# Patient Record
Sex: Male | Born: 1945 | Race: White | Hispanic: No | Marital: Married | Smoking: Former smoker
Health system: Southern US, Community
[De-identification: ages and names within clinical notes are randomized; demographics above are authoritative.]

## PROBLEM LIST (undated history)

## (undated) DIAGNOSIS — K219 Gastro-esophageal reflux disease without esophagitis: Secondary | ICD-10-CM

## (undated) DIAGNOSIS — K5790 Diverticulosis of intestine, part unspecified, without perforation or abscess without bleeding: Secondary | ICD-10-CM

## (undated) DIAGNOSIS — R0600 Dyspnea, unspecified: Secondary | ICD-10-CM

## (undated) DIAGNOSIS — I209 Angina pectoris, unspecified: Secondary | ICD-10-CM

## (undated) DIAGNOSIS — I493 Ventricular premature depolarization: Secondary | ICD-10-CM

## (undated) DIAGNOSIS — E785 Hyperlipidemia, unspecified: Secondary | ICD-10-CM

## (undated) DIAGNOSIS — J189 Pneumonia, unspecified organism: Secondary | ICD-10-CM

## (undated) DIAGNOSIS — B029 Zoster without complications: Secondary | ICD-10-CM

## (undated) DIAGNOSIS — Z87442 Personal history of urinary calculi: Secondary | ICD-10-CM

## (undated) DIAGNOSIS — T7840XA Allergy, unspecified, initial encounter: Secondary | ICD-10-CM

## (undated) DIAGNOSIS — J90 Pleural effusion, not elsewhere classified: Secondary | ICD-10-CM

## (undated) DIAGNOSIS — N200 Calculus of kidney: Secondary | ICD-10-CM

## (undated) DIAGNOSIS — R55 Syncope and collapse: Secondary | ICD-10-CM

## (undated) DIAGNOSIS — I2510 Atherosclerotic heart disease of native coronary artery without angina pectoris: Secondary | ICD-10-CM

## (undated) DIAGNOSIS — M1990 Unspecified osteoarthritis, unspecified site: Secondary | ICD-10-CM

## (undated) DIAGNOSIS — I10 Essential (primary) hypertension: Secondary | ICD-10-CM

## (undated) DIAGNOSIS — N400 Enlarged prostate without lower urinary tract symptoms: Secondary | ICD-10-CM

## (undated) HISTORY — DX: Ventricular premature depolarization: I49.3

## (undated) HISTORY — PX: EYE SURGERY: SHX253

## (undated) HISTORY — DX: Essential (primary) hypertension: I10

## (undated) HISTORY — DX: Hyperlipidemia, unspecified: E78.5

## (undated) HISTORY — DX: Benign prostatic hyperplasia without lower urinary tract symptoms: N40.0

## (undated) HISTORY — DX: Pleural effusion, not elsewhere classified: J90

## (undated) HISTORY — DX: Diverticulosis of intestine, part unspecified, without perforation or abscess without bleeding: K57.90

## (undated) HISTORY — PX: APPENDECTOMY: SHX54

## (undated) HISTORY — DX: Calculus of kidney: N20.0

## (undated) HISTORY — DX: Syncope and collapse: R55

## (undated) HISTORY — DX: Gastro-esophageal reflux disease without esophagitis: K21.9

## (undated) HISTORY — DX: Allergy, unspecified, initial encounter: T78.40XA

---

## 1999-08-27 ENCOUNTER — Encounter: Admission: RE | Admit: 1999-08-27 | Discharge: 1999-08-27 | Payer: Self-pay | Admitting: Internal Medicine

## 2003-01-09 ENCOUNTER — Encounter: Admission: RE | Admit: 2003-01-09 | Discharge: 2003-01-09 | Payer: Self-pay | Admitting: Otolaryngology

## 2003-01-09 ENCOUNTER — Encounter: Payer: Self-pay | Admitting: Otolaryngology

## 2004-09-02 ENCOUNTER — Ambulatory Visit: Payer: Self-pay | Admitting: Internal Medicine

## 2004-12-01 ENCOUNTER — Ambulatory Visit: Payer: Self-pay | Admitting: Internal Medicine

## 2005-01-12 ENCOUNTER — Ambulatory Visit: Payer: Self-pay | Admitting: Internal Medicine

## 2005-02-09 ENCOUNTER — Encounter: Admission: RE | Admit: 2005-02-09 | Discharge: 2005-02-09 | Payer: Self-pay | Admitting: Otolaryngology

## 2005-04-21 ENCOUNTER — Ambulatory Visit: Payer: Self-pay | Admitting: Internal Medicine

## 2005-08-20 ENCOUNTER — Ambulatory Visit: Payer: Self-pay | Admitting: Internal Medicine

## 2005-09-30 ENCOUNTER — Ambulatory Visit: Payer: Self-pay | Admitting: Internal Medicine

## 2005-09-30 ENCOUNTER — Inpatient Hospital Stay (HOSPITAL_COMMUNITY): Admission: EM | Admit: 2005-09-30 | Discharge: 2005-10-01 | Payer: Self-pay | Admitting: Emergency Medicine

## 2005-10-13 ENCOUNTER — Ambulatory Visit: Payer: Self-pay | Admitting: Internal Medicine

## 2005-11-18 ENCOUNTER — Ambulatory Visit: Payer: Self-pay | Admitting: Internal Medicine

## 2005-11-20 ENCOUNTER — Ambulatory Visit: Payer: Self-pay | Admitting: Internal Medicine

## 2006-04-30 ENCOUNTER — Ambulatory Visit: Payer: Self-pay | Admitting: Internal Medicine

## 2006-06-11 ENCOUNTER — Ambulatory Visit: Payer: Self-pay | Admitting: Pulmonary Disease

## 2006-09-02 ENCOUNTER — Ambulatory Visit: Payer: Self-pay | Admitting: Internal Medicine

## 2006-09-02 LAB — CONVERTED CEMR LAB
ALT: 30 units/L (ref 0–40)
Albumin: 3.9 g/dL (ref 3.5–5.2)
Alkaline Phosphatase: 87 units/L (ref 39–117)
Basophils Absolute: 0 10*3/uL (ref 0.0–0.1)
Bilirubin Urine: NEGATIVE
Bilirubin, Direct: 0.2 mg/dL (ref 0.0–0.3)
CO2: 31 meq/L (ref 19–32)
Calcium: 9.2 mg/dL (ref 8.4–10.5)
Cholesterol: 180 mg/dL (ref 0–200)
GFR calc non Af Amer: 80 mL/min
Hemoglobin, Urine: NEGATIVE
Hemoglobin: 16.4 g/dL (ref 13.0–17.0)
Leukocytes, UA: NEGATIVE
MCHC: 33.8 g/dL (ref 30.0–36.0)
Monocytes Absolute: 0.7 10*3/uL (ref 0.2–0.7)
Neutro Abs: 3.4 10*3/uL (ref 1.4–7.7)
Nitrite: NEGATIVE
PSA: 3.5 ng/mL (ref 0.10–4.00)
Total CHOL/HDL Ratio: 3.6
Total Protein: 6.8 g/dL (ref 6.0–8.3)
Triglycerides: 162 mg/dL — ABNORMAL HIGH (ref 0–149)
Urine Glucose: NEGATIVE mg/dL
WBC: 7.3 10*3/uL (ref 4.5–10.5)
pH: 6 (ref 5.0–8.0)

## 2006-10-20 ENCOUNTER — Ambulatory Visit: Payer: Self-pay | Admitting: Internal Medicine

## 2006-11-10 ENCOUNTER — Ambulatory Visit: Payer: Self-pay | Admitting: Internal Medicine

## 2006-12-01 ENCOUNTER — Ambulatory Visit: Payer: Self-pay | Admitting: Internal Medicine

## 2006-12-01 LAB — CONVERTED CEMR LAB
Testosterone: 401.34 ng/dL (ref 350.00–890)
Vit D, 1,25-Dihydroxy: 34 (ref 20–57)

## 2007-01-17 ENCOUNTER — Ambulatory Visit: Payer: Self-pay | Admitting: Internal Medicine

## 2007-05-02 ENCOUNTER — Ambulatory Visit: Payer: Self-pay | Admitting: Internal Medicine

## 2007-07-08 ENCOUNTER — Encounter: Payer: Self-pay | Admitting: Internal Medicine

## 2007-07-08 DIAGNOSIS — J309 Allergic rhinitis, unspecified: Secondary | ICD-10-CM | POA: Insufficient documentation

## 2007-07-08 DIAGNOSIS — N200 Calculus of kidney: Secondary | ICD-10-CM | POA: Insufficient documentation

## 2007-07-08 DIAGNOSIS — K5730 Diverticulosis of large intestine without perforation or abscess without bleeding: Secondary | ICD-10-CM

## 2007-07-08 DIAGNOSIS — D126 Benign neoplasm of colon, unspecified: Secondary | ICD-10-CM

## 2007-07-08 DIAGNOSIS — Z886 Allergy status to analgesic agent status: Secondary | ICD-10-CM

## 2007-07-08 DIAGNOSIS — E785 Hyperlipidemia, unspecified: Secondary | ICD-10-CM | POA: Insufficient documentation

## 2007-07-08 DIAGNOSIS — J45909 Unspecified asthma, uncomplicated: Secondary | ICD-10-CM | POA: Insufficient documentation

## 2007-07-08 DIAGNOSIS — Z87898 Personal history of other specified conditions: Secondary | ICD-10-CM

## 2007-07-08 DIAGNOSIS — N400 Enlarged prostate without lower urinary tract symptoms: Secondary | ICD-10-CM | POA: Insufficient documentation

## 2007-07-08 DIAGNOSIS — J339 Nasal polyp, unspecified: Secondary | ICD-10-CM

## 2007-10-10 ENCOUNTER — Ambulatory Visit: Payer: Self-pay | Admitting: Internal Medicine

## 2007-10-10 DIAGNOSIS — M949 Disorder of cartilage, unspecified: Secondary | ICD-10-CM

## 2007-10-10 DIAGNOSIS — M899 Disorder of bone, unspecified: Secondary | ICD-10-CM | POA: Insufficient documentation

## 2007-11-24 ENCOUNTER — Ambulatory Visit: Payer: Self-pay | Admitting: Internal Medicine

## 2007-11-24 DIAGNOSIS — IMO0002 Reserved for concepts with insufficient information to code with codable children: Secondary | ICD-10-CM

## 2007-12-01 ENCOUNTER — Ambulatory Visit: Payer: Self-pay | Admitting: Internal Medicine

## 2007-12-01 DIAGNOSIS — M7020 Olecranon bursitis, unspecified elbow: Secondary | ICD-10-CM

## 2007-12-05 ENCOUNTER — Encounter: Payer: Self-pay | Admitting: Internal Medicine

## 2007-12-12 ENCOUNTER — Encounter: Payer: Self-pay | Admitting: Internal Medicine

## 2008-01-24 ENCOUNTER — Encounter: Payer: Self-pay | Admitting: Internal Medicine

## 2008-02-09 ENCOUNTER — Encounter: Payer: Self-pay | Admitting: Internal Medicine

## 2008-03-15 ENCOUNTER — Encounter: Payer: Self-pay | Admitting: Internal Medicine

## 2008-05-10 ENCOUNTER — Emergency Department (HOSPITAL_COMMUNITY): Admission: EM | Admit: 2008-05-10 | Discharge: 2008-05-10 | Payer: Self-pay | Admitting: Emergency Medicine

## 2008-05-10 ENCOUNTER — Telehealth (INDEPENDENT_AMBULATORY_CARE_PROVIDER_SITE_OTHER): Payer: Self-pay | Admitting: *Deleted

## 2008-05-17 ENCOUNTER — Ambulatory Visit: Payer: Self-pay | Admitting: Internal Medicine

## 2008-05-22 ENCOUNTER — Encounter: Payer: Self-pay | Admitting: Internal Medicine

## 2008-06-26 ENCOUNTER — Telehealth (INDEPENDENT_AMBULATORY_CARE_PROVIDER_SITE_OTHER): Payer: Self-pay | Admitting: *Deleted

## 2008-06-27 ENCOUNTER — Ambulatory Visit: Payer: Self-pay | Admitting: Internal Medicine

## 2008-06-27 DIAGNOSIS — R55 Syncope and collapse: Secondary | ICD-10-CM | POA: Insufficient documentation

## 2008-06-27 DIAGNOSIS — R002 Palpitations: Secondary | ICD-10-CM

## 2008-07-03 ENCOUNTER — Ambulatory Visit: Payer: Self-pay

## 2008-07-04 ENCOUNTER — Ambulatory Visit: Payer: Self-pay

## 2008-07-04 ENCOUNTER — Ambulatory Visit: Payer: Self-pay | Admitting: Cardiology

## 2008-07-05 ENCOUNTER — Encounter: Payer: Self-pay | Admitting: Cardiology

## 2008-07-05 ENCOUNTER — Ambulatory Visit: Payer: Self-pay

## 2008-07-11 ENCOUNTER — Encounter: Payer: Self-pay | Admitting: Internal Medicine

## 2008-07-16 ENCOUNTER — Ambulatory Visit: Payer: Self-pay | Admitting: Cardiology

## 2008-10-15 ENCOUNTER — Ambulatory Visit: Payer: Self-pay | Admitting: Cardiology

## 2008-10-15 ENCOUNTER — Encounter: Payer: Self-pay | Admitting: Cardiology

## 2008-12-10 ENCOUNTER — Encounter: Payer: Self-pay | Admitting: Internal Medicine

## 2009-06-10 ENCOUNTER — Encounter: Payer: Self-pay | Admitting: Internal Medicine

## 2009-06-11 ENCOUNTER — Ambulatory Visit: Payer: Self-pay | Admitting: Internal Medicine

## 2009-06-12 LAB — CONVERTED CEMR LAB
AST: 22 units/L (ref 0–37)
Albumin: 4.1 g/dL (ref 3.5–5.2)
BUN: 16 mg/dL (ref 6–23)
Basophils Relative: 1 % (ref 0.0–3.0)
Calcium: 9.3 mg/dL (ref 8.4–10.5)
Chloride: 102 meq/L (ref 96–112)
Cholesterol: 201 mg/dL — ABNORMAL HIGH (ref 0–200)
Eosinophils Absolute: 0.5 10*3/uL (ref 0.0–0.7)
Glucose, Bld: 95 mg/dL (ref 70–99)
HCT: 46.2 % (ref 39.0–52.0)
MCHC: 33.7 g/dL (ref 30.0–36.0)
Monocytes Relative: 9 % (ref 3.0–12.0)
RBC: 5.3 M/uL (ref 4.22–5.81)
RDW: 13.3 % (ref 11.5–14.6)
Total Bilirubin: 1.1 mg/dL (ref 0.3–1.2)
Total CHOL/HDL Ratio: 4
Triglycerides: 110 mg/dL (ref 0.0–149.0)
VLDL: 22 mg/dL (ref 0.0–40.0)
WBC: 7 10*3/uL (ref 4.5–10.5)

## 2009-07-23 ENCOUNTER — Ambulatory Visit: Payer: Self-pay | Admitting: Internal Medicine

## 2009-07-31 ENCOUNTER — Encounter: Payer: Self-pay | Admitting: Internal Medicine

## 2010-01-23 ENCOUNTER — Ambulatory Visit: Payer: Self-pay | Admitting: Internal Medicine

## 2010-01-23 LAB — CONVERTED CEMR LAB
ALT: 22 units/L (ref 0–53)
Albumin: 4 g/dL (ref 3.5–5.2)
BUN: 21 mg/dL (ref 6–23)
Basophils Absolute: 0.1 10*3/uL (ref 0.0–0.1)
CO2: 31 meq/L (ref 19–32)
Calcium: 9 mg/dL (ref 8.4–10.5)
Cholesterol: 192 mg/dL (ref 0–200)
Creatinine, Ser: 1 mg/dL (ref 0.4–1.5)
Glucose, Bld: 85 mg/dL (ref 70–99)
HCT: 48 % (ref 39.0–52.0)
HDL: 42.6 mg/dL (ref >39.00)
Hemoglobin: 16.4 g/dL (ref 13.0–17.0)
Lymphocytes Relative: 30.3 % (ref 12.0–46.0)
Monocytes Relative: 7.6 % (ref 3.0–12.0)
Neutro Abs: 3.8 10*3/uL (ref 1.4–7.7)
Neutrophils Relative %: 50.4 % (ref 43.0–77.0)
Potassium: 4.5 meq/L (ref 3.5–5.1)
Sodium: 137 meq/L (ref 135–145)
Total Bilirubin: 0.7 mg/dL (ref 0.3–1.2)
WBC: 7.6 10*3/uL (ref 4.5–10.5)

## 2010-01-28 LAB — CONVERTED CEMR LAB: IgE (Immunoglobulin E), Serum: 18.3 intl units/mL (ref 0.0–180.0)

## 2010-06-03 ENCOUNTER — Encounter: Payer: Self-pay | Admitting: Internal Medicine

## 2010-07-24 ENCOUNTER — Telehealth (INDEPENDENT_AMBULATORY_CARE_PROVIDER_SITE_OTHER): Payer: Self-pay | Admitting: *Deleted

## 2010-07-25 ENCOUNTER — Ambulatory Visit
Admission: RE | Admit: 2010-07-25 | Discharge: 2010-07-25 | Payer: Self-pay | Source: Home / Self Care | Attending: Internal Medicine | Admitting: Internal Medicine

## 2010-07-30 DIAGNOSIS — B029 Zoster without complications: Secondary | ICD-10-CM | POA: Insufficient documentation

## 2010-08-08 ENCOUNTER — Ambulatory Visit
Admission: RE | Admit: 2010-08-08 | Discharge: 2010-08-08 | Payer: Self-pay | Source: Home / Self Care | Attending: Internal Medicine | Admitting: Internal Medicine

## 2010-08-24 LAB — CONVERTED CEMR LAB
AST: 25 units/L (ref 0–37)
Alkaline Phosphatase: 86 units/L (ref 39–117)
BUN: 18 mg/dL (ref 6–23)
Basophils Absolute: 0.1 10*3/uL (ref 0.0–0.1)
Basophils Relative: 0.9 % (ref 0.0–1.0)
Bilirubin, Direct: 0.2 mg/dL (ref 0.0–0.3)
Calcium: 9.2 mg/dL (ref 8.4–10.5)
Chloride: 103 meq/L (ref 96–112)
Cholesterol: 148 mg/dL (ref 0–200)
Creatinine, Ser: 1 mg/dL (ref 0.4–1.5)
Glucose, Bld: 96 mg/dL (ref 70–99)
HDL: 42.5 mg/dL (ref >39.0)
Hemoglobin: 15.1 g/dL (ref 13.0–17.0)
LDL Cholesterol: 92 mg/dL (ref 0–99)
Lymphocytes Relative: 21.7 % (ref 12.0–46.0)
Monocytes Relative: 7.9 % (ref 3.0–11.0)
RBC: 5.54 M/uL (ref 4.22–5.81)
RDW: 13.5 % (ref 11.5–14.6)
Sodium: 140 meq/L (ref 135–145)
TSH: 0.98 microintl units/mL (ref 0.35–5.50)

## 2010-08-28 NOTE — Progress Notes (Signed)
Summary: appointment  Phone Note Call from Patient Call back at Home Phone [phone]   Caller: Patient Call For: dr wert Summary of Call: Patient phoned he would like to be seen by Dr. Sherene Sires tomorrow. He has a rash on the back of his head and in his hair he has knots in his hair line and it seems to be spreading. Patient can be reached at [phone] his cell [phone] Initial call taken by: Vedia Coffer,  July 24, 2010 3:09 PM  Follow-up for Phone Call        Spoke with patient-states he is having sores in head; ? shingles. Appt has been made for 07-25-10 at 11am with TP.Reynaldo Minium CMA  July 24, 2010 4:18 PM

## 2010-08-28 NOTE — Letter (Signed)
Summary: [facility]   Imported By: Sherian Rein 06/12/2010 14:27:54  _____________________________________________________________________  External Attachment:    Type:   Image     Comment:   External Document

## 2010-08-28 NOTE — Consult Note (Signed)
Summary: [facility]  [facility]   Imported By: Lester Commerce 08/15/2009 11:02:51  _____________________________________________________________________  External Attachment:    Type:   Image     Comment:   External Document

## 2010-08-28 NOTE — Assessment & Plan Note (Signed)
Summary: Primary svc/ ext ov multiple issues    Primary Provider/Referring Provider:  Sherene Sires  CC:  6 month followup.  Pt fasting.  Pt c/o nasal congestion and states "always have had this problem"- he is able to blow nose sometimes and mucus is yellow.  No other complaints today.[name] Kitchen  History of Present Illness: 65 year old male with history  of Triad Asthma and hyperlipidemia   January 23, 2010 6 month followup.  Pt fasting.  Pt c/o nasal congestion and states "always have had this problem"- he is able to blow nose sometimes and mucus is yellow.  No other complaints today. due f/u with ent.  no cp/tia.  Pt denies any significant sore throat, dysphagia, itching, sneezing, fever, chills, sweats, unintended wt loss, pleuritic or exertional cp, hempoptysis, change in activity tolerance  orthopnea pnd or leg swelling.  Pt also denies any obvious fluctuation in symptoms with weather or environmental change or other alleviating or aggravating factors.    Pt denies any increase in rescue therapy over baseline, denies waking up needing it or having early am exacerbations of coughing/wheezing/ or dyspnea   Current Medications (verified): 1)  Singulair 10 Mg  Tabs (Montelukast Sodium) .... Once Daily 2)  Simvastatin 80 Mg Tabs (Simvastatin) .[name] Kitchen.. 1 At Bedtime 3)  Symbicort 160-4.5 Mcg/act  Aero (Budesonide-Formoterol Fumarate) .... Two Puffs Every 12 Hours 4)  Centrum   Tabs (Multiple Vitamins-Minerals) .... Once Daily 5)  Omeprazole 20 Mg  Cpdr (Omeprazole) .... Once Daily 6)  Calcium 500 500 Mg  Tabs (Calcium Carbonate) .... Once Daily 7)  Lopressor 50 Mg Tabs (Metoprolol Tartrate) .... One Half Twice Daily 8)  Lorazepam 0.5 Mg  Tabs (Lorazepam) .... One A Day As Needed 9)  Albuterol 90 Mcg/act  Aers (Albuterol) .... 2 Puffs Every 4 Hrs As Needed 10)  Afrin Nasal Spray 0.05 %  Soln (Oxymetazoline Hcl) .... As Needed 11)  Tylenol Extra Strength 500 Mg  Tabs (Acetaminophen) .... As Needed 12)  Zovirax 5 %  Oint (Acyclovir) .... Apply Four Times A Day 13)  Chlor-Trimeton 4 Mg Tabs (Chlorpheniramine Maleate) .... As Directed On Bottle As Needed  Allergies (verified): 1)  ! Asa 2)  Augmentin 3)  Amoxicillin  Past History:  Past Medical History: PVCs.  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) ALLERGIC RHINITIS, CHRONIC (ICD-477.9)........................[name]      - steroid dep until mid oct 2009      - Allergy profile sent January 23, 2010 >> IgE 18.3  BENIGN PROSTATIC HYPERTROPHY, HX OF (ICD-V13.8) DIVERTICULOSIS, MILD (ICD-562.10)................................[name] KitchenLina Sar     - flex sig 08/12/00     - letter sent 06/05/05, reminded May 17, 2008  NEPHROLITHIASIS (ICD-592.0) HYPERLIPIDEMIA (ICD-272.4) target < 130 male, pos fm hx, h/l smoking HEALTH MAINTENANCE.......................................................[name] KitchenWert   - CPX  July 23, 2009    - Pneumovax 5/06  and @ age 50 June 11, 2009    - Td 1/07     Vital Signs:  Patient profile:   65 year old male Weight:      189 pounds O2 Sat:      96 % on Room air Temp:     97.7 degrees F oral Pulse rate:   71 / minute BP sitting:   128 / 78  (left  arm)  Vitals Entered By: Vernie Murders (January 23, 2010 9:30 AM)  O2 Flow:  Room air  Physical Exam  Additional Exam:  wt  198 May 17, 2008 > 198 June 27, 2008 >   197 July 23, 2009 > 189 January 23, 2010  Ambulatory healthy appearing wm  in no acute distress but striking  nasal tone to voice  HEENT: nl dentition, turbinates, and orophanx. Nl external ear canals without cough reflex Neck without JVD/Nodes/TM Lungs clear to A and P bilaterally without cough on insp or exp maneuvers RRR no s3 or murmur or increase in P2 Abd soft and benign with nl excursion in the supine position. No bruits or  organomegaly Ext warm without calf tenderness, cyanosis clubbing or edema Skin warm and dry without lesions    Sodium                    137 mEq/L                   135-145   Potassium                 4.5 mEq/L                   3.5-5.1   Chloride                  100 mEq/L                   96-112   Carbon Dioxide            31 mEq/L                    19-32   Glucose                   85 mg/dL                    08-65   BUN                       21 mg/dL                    7-84   Creatinine                1.0 mg/dL                   6.9-6.2   Calcium                   9.0 mg/dL                   9.5-28.4   GFR                       82.19 mL/min                >60  Tests: (2) Hepatic/Liver Function Panel (HEPATIC)   Total Bilirubin           0.7 mg/dL                   1.3-2.4   Direct Bilirubin          0.1 mg/dL                   4.0-1.0   Alkaline Phosphatase      73 U/L  39-117   AST                       22 U/L                      0-37   ALT                       22 U/L                      0-53   Total Protein             6.9 g/dL                    0.9-8.1   Albumin                   4.0 g/dL                    1.9-1.4  Tests: (3) Lipid Panel (LIPID)   Cholesterol               192 mg/dL                   7-829     ATP III Classification            Desirable:  < 200 mg/dL                    Borderline High:  200 - 239 mg/dL               High:  > = 240 mg/dL   Triglycerides             106.0 mg/dL                 5.6-213.0     Normal:  <150 mg/dL     Borderline High:  865 - 199 mg/dL   HDL                       78.46 mg/dL                 >96.29   VLDL Cholesterol          21.2 mg/dL                  5.2-84.1   LDL Cholesterol      [H]  324 mg/dL                   4-01  CHO/HDL Ratio:  CHD Risk                             5                    Men          Women     1/2 Average Risk     3.4          3.3     Average Risk          5.0          4.4      2X Average Risk          9.6          7.1  3X Average Risk          15.0          11.0                           Tests: (4) CBC Platelet w/Diff (CBCD)   White Cell Count          7.6 K/uL                    4.5-10.5   Red Cell Count            5.64 Mil/uL                 4.22-5.81   Hemoglobin                16.4 g/dL                   04.5-40.9   Hematocrit                48.0 %                      39.0-52.0   MCV                       85.0 fl                     78.0-100.0   MCHC                      34.2 g/dL                   81.1-91.4   RDW                       14.2 %                      11.5-14.6   Platelet Count            236.0 K/uL                  150.0-400.0   Neutrophil %              50.4 %                      43.0-77.0   Lymphocyte %              30.3 %                      12.0-46.0   Monocyte %                7.6 %                       3.0-12.0   Eosinophils%         [H]  11.0 %                      0.0-5.0   Basophils %               0.7 %                       0.0-3.0   Neutrophill Absolute      3.8  K/uL                    1.4-7.7   Lymphocyte Absolute       2.3 K/uL                    0.7-4.0   Monocyte Absolute         0.6 K/uL                    0.1-1.0  Eosinophils, Absolute                        [H]  0.8 K/uL                    0.0-0.7   Basophils Absolute        0.1 K/uL                    0.0-0.1   Impression & Recommendations:  Problem # 1:  HYPERLIPIDEMIA (ICD-272.4)  target < 130 male, pos fm hx, h/l smoking  His updated medication list for this problem includes:    Simvastatin 80 Mg Tabs (Simvastatin) .[name] Kitchen... 1 at bedtime    Labs Reviewed: SGOT: 22 (06/11/2009)   SGPT: 20 (06/11/2009)   HDL:45.00 (06/11/2009), 42.5 (10/10/2007)  LDL:92 (10/10/2007), 98 (09/02/2006)  >  128 January 23, 2010  so at goal  Chol:201 (06/11/2009), 148 (10/10/2007)  Trig:110.0 (06/11/2009), 68 (10/10/2007)  Problem # 2:  ALLERGIC RHINITIS, CHRONIC (ICD-477.9)  His  updated medication list for this problem includes:    Afrin Nasal Spray 0.05 % Soln (Oxymetazoline hcl) .[name] Kitchen... As needed    Chlor-trimeton 4 Mg Tabs (Chlorpheniramine maleate) .[name] Kitchen... As directed on bottle as needed    May need additional nasal surgery as he has given up on medical rx, defer to ent  Problem # 3:  ASTHMA (ICD-493.90) All goals of asthma met including optimal function and elimination of symptoms with minimum need for rescue therapy. Contingencies discussed today including the rule of two's.   Each maintenance medication was reviewed in detail including most importantly the difference between maintenance prns and under what circumstances the prns are to be used.  In addition, these two groups (for which the patient should keep up with refills) were distinguished from a third group :  meds that are used only short term with the intent to complete a course of therapy and then not refill them.  The med list was then fully reconciled and reorganized to reflect this important distinction.   Other Orders: T-Allergy Profile [facility] [place], [place], [place], [place], [place] [phone]) TLB-BMP (Basic Metabolic Panel-BMET) (80048-METABOL) TLB-Hepatic/Liver Function Pnl (80076-HEPATIC) TLB-Lipid Panel (80061-LIPID) TLB-CBC Platelet - w/Differential (85025-CBCD) Est. Patient Level IV (56213)  Patient Instructions: 1)  Return to office in 3 months, sooner if needed 2)  NEED TO  call for  COLONOSCOPYand urology f/u

## 2010-08-28 NOTE — Assessment & Plan Note (Signed)
Summary: 2 WEEK ROV//SH   Primary Provider/Referring Provider:  Sherene Sires  CC:  Pt here for follow up on Shingles. States rash is gone and does c/o tingling sensation when touched.  History of Present Illness: 65 year old male with history  of Triad Asthma and hyperlipidemia   January 23, 2010 6 month followup.  Pt fasting.  Pt c/o nasal congestion and states "always have had this problem"- he is able to blow nose sometimes and mucus is yellow.  No other complaints today. due f/u with ent.  no cp/tia.    07/25/10- Presents for an acute office visit. Complains of  red, itchy, painful bumps on the top of his head- onset was 2-3 days ago. Noticed a tingling burning sensation along left side of scalp line. >>Dx w/ herpes zoster-rx Valtrex  August 12, 2010 --Presents for follow up of shingles. He is feeling better , rash is gone, does c/o tingling sensation when touched but is much better. Finished Valtrex. Denies chest pain, dyspnea, orthopnea, hemoptysis, fever, n/v/d, edema, headache.   Preventive Screening-Counseling & Management  Alcohol-Tobacco     Smoking Status: quit     Year Quit: 1971     Pack years: 61yrs, 2ppd  Medications Prior to Update: 1)  Singulair 10 Mg  Tabs (Montelukast Sodium) .... Once Daily 2)  Simvastatin 80 Mg Tabs (Simvastatin) .[name] Kitchen.. 1 At Bedtime 3)  Symbicort 160-4.5 Mcg/act  Aero (Budesonide-Formoterol Fumarate) .... Two Puffs Every 12 Hours 4)  Centrum   Tabs (Multiple Vitamins-Minerals) .... Once Daily 5)  Omeprazole 20 Mg  Cpdr (Omeprazole) .... Once Daily 6)  Calcium 500 500 Mg  Tabs (Calcium Carbonate) .... Once Daily 7)  Lopressor 50 Mg Tabs (Metoprolol Tartrate) .... One Half Twice Daily 8)  Lorazepam 0.5 Mg  Tabs (Lorazepam) .... One A Day As Needed 9)  Albuterol 90 Mcg/act  Aers (Albuterol) .... 2 Puffs Every 4 Hrs As Needed 10)  Afrin Nasal Spray 0.05 %  Soln (Oxymetazoline Hcl) .... As Needed 11)  Tylenol Extra Strength 500 Mg  Tabs (Acetaminophen) .... As  Needed 12)  Zovirax 5 % Oint (Acyclovir) .... Apply Four Times A Day 13)  Chlor-Trimeton 4 Mg Tabs (Chlorpheniramine Maleate) .... As Directed On Bottle As Needed 14)  Valtrex 1 Gm Tabs (Valacyclovir Hcl) .[name] Kitchen.. 1 By Mouth Three Times A Day 15)  Prednisone 10 Mg Tabs (Prednisone) .... 4 Tabs For 2 Days, Then 3 Tabs For 2 Days, 2 Tabs For 2 Days, Then 1 Tab For 2 Days, Then Stop 16)  Vicodin 5-500 Mg Tabs (Hydrocodone-Acetaminophen) .[name] Kitchen.. 1 Every 4 Hr As Needed Severe Pain  Current Medications (verified): 1)  Singulair 10 Mg  Tabs (Montelukast Sodium) .... Once Daily 2)  Simvastatin 80 Mg Tabs (Simvastatin) .[name] Kitchen.. 1 At Bedtime 3)  Symbicort 160-4.5 Mcg/act  Aero (Budesonide-Formoterol Fumarate) .... Two Puffs Every 12 Hours 4)  Centrum   Tabs (Multiple Vitamins-Minerals) .... Once Daily 5)  Omeprazole 20 Mg  Cpdr (Omeprazole) .... Once Daily 6)  Calcium 500 500 Mg  Tabs (Calcium Carbonate) .... Once Daily 7)  Lopressor 50 Mg Tabs (Metoprolol Tartrate) .... One Half Twice Daily 8)  Lorazepam 0.5 Mg  Tabs (Lorazepam) .... One A Day As Needed 9)  Albuterol 90 Mcg/act  Aers (Albuterol) .... 2 Puffs Every 4 Hrs As Needed 10)  Afrin Nasal Spray 0.05 %  Soln (Oxymetazoline Hcl) .... As Needed 11)  Tylenol Extra Strength 500 Mg  Tabs (Acetaminophen) .... As Needed 12)  Zovirax  5 % Oint (Acyclovir) .... Apply Four Times A Day As Needed 13)  Chlor-Trimeton 4 Mg Tabs (Chlorpheniramine Maleate) .... As Directed On Bottle As Needed 14)  Vicodin 5-500 Mg Tabs (Hydrocodone-Acetaminophen) .[name] Kitchen.. 1 Every 4 Hr As Needed Severe Pain  Allergies (verified): 1)  ! Asa 2)  ! Sulfa 3)  Augmentin 4)  Amoxicillin  Past History:  Past Medical History: Last updated: 01/23/2010 PVCs.  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) ALLERGIC RHINITIS,  CHRONIC (ICD-477.9)........................[name]      - steroid dep until mid oct 2009      - Allergy profile sent January 23, 2010 >> IgE 18.3  BENIGN PROSTATIC HYPERTROPHY, HX OF (ICD-V13.8) DIVERTICULOSIS, MILD (ICD-562.10)................................[name] KitchenLina Sar     - flex sig 08/12/00     - letter sent 06/05/05, reminded May 17, 2008  NEPHROLITHIASIS (ICD-592.0) HYPERLIPIDEMIA (ICD-272.4) target < 130 male, pos fm hx, h/l smoking HEALTH MAINTENANCE.......................................................[name] KitchenWert   - CPX  July 23, 2009    - Pneumovax 5/06  and @ age 50 June 11, 2009    - Td 1/07     Past Surgical History: Last updated: 07/08/2007 Appendectomy Colonoscopy  Family History: Last updated: 10/12/2008  heart disease in his father at age 63 he was a smoker positive for coronary artery disease in his   father.  siblings healthy mother still living without ascvd  Social History: Last updated: 07/23/2009 quit smoking 1971 rarely drink alcohol Retired  Risk Factors: Smoking Status: quit (08/08/2010)  Review of Systems      See HPI  Vital Signs:  Patient profile:   65 year old male Height:      70 inches Weight:      195.2 pounds BMI:     28.11 O2 Sat:      97 % on Room air Temp:     96.9 degrees F oral Pulse rate:   68 / minute BP sitting:   126 / 76  (left arm) Cuff size:   regular  Vitals Entered By: Zackery Barefoot CMA (August 08, 2010 11:32 AM)  O2 Flow:  Room air CC: Pt here for follow up on Shingles. States rash is gone, does c/o tingling sensation when touched Is Patient Diabetic? No Comments Medications reviewed with patient Verified contact number and pharmacy with patient Zackery Barefoot [facility]  August 08, 2010 11:33 AM    Physical Exam  Additional Exam:  wt  198 May 17, 2008 > 198 June 27, 2008 >   197 July 23, 2009 > 189 January 23, 2010 >>195 12/30 >>195 1/13 Ambulatory healthy appearing wm  in no acute distress  but striking  nasal tone to voice  HEENT: nl dentition, turbinates, and orophanx. Nl external ear canals without cough reflex Neck without JVD/Nodes/TM Lungs clear to A and P bilaterally without cough on insp or exp maneuvers RRR no s3 or murmur or increase in P2 Abd soft and benign with nl excursion in the supine position. No bruits or organomegaly Ext warm without calf tenderness, cyanosis clubbing or edema Skin: rash resolved w/ residual hyperpigmentation  along scalp/hairline    Impression & Recommendations:  Problem # 1:  HERPES ZOSTER (ICD-053.9)  Improved with Valtrex. Does not appear to have any significant pain  Plan: follow up Dr. Sherene Sires 1  months  and as needed  Tylenol as needed     Orders: Est. Patient Level II (98119)  Medications Added to Medication List This Visit: 1)  Zovirax 5 % Oint (Acyclovir) .... Apply four times a day as needed  Patient Instructions: 1)  follow up Dr. Sherene Sires 1 months  and as needed  2)  Tylenol as needed  3)

## 2010-08-28 NOTE — Assessment & Plan Note (Signed)
Summary: Acute NP office visit - ? shingles   Primary Provider/Referring Provider:  Sherene Sires  CC:  Acute visit.  Pt c/o red, itchy, and painful bumps on the top of his head- onset was 2-3 days ago.Antonio Carlson  History of Present Illness: 65 year old male with history  of [facility] Asthma and hyperlipidemia   January 23, 2010 6 month followup.  Pt fasting.  Pt c/o nasal congestion and states "always have had this problem"- he is able to blow nose sometimes and mucus is yellow.  No other complaints today. due f/u with ent.  no cp/tia.    07/25/10- Presents for an acute office visit. Complains of  red, itchy, painful bumps on the top of his head- onset was 2-3 days ago. Noticed a tingling burning sensation along left side of scalp line. No new meds. no drainage , fever. NO visual /speech changes. no recent travel.Denies chest pain, dyspnea, orthopnea, hemoptysis, fever, n/v/d, edema, headache,recent travel or antibiotics.    Preventive Screening-Counseling & Management  Alcohol-Tobacco     Smoking Status: quit  Medications Prior to Update: 1)  Singulair 10 Mg  Tabs (Montelukast Sodium) .... Once Daily 2)  Simvastatin 80 Mg Tabs (Simvastatin) .Antonio Carlson.. 1 At Bedtime 3)  Symbicort 160-4.5 Mcg/act  Aero (Budesonide-Formoterol Fumarate) .... Two Puffs Every 12 Hours 4)  Centrum   Tabs (Multiple Vitamins-Minerals) .... Once Daily 5)  Omeprazole 20 Mg  Cpdr (Omeprazole) .... Once Daily 6)  Calcium 500 500 Mg  Tabs (Calcium Carbonate) .... Once Daily 7)  Lopressor 50 Mg Tabs (Metoprolol Tartrate) .... One Half Twice Daily 8)  Lorazepam 0.5 Mg  Tabs (Lorazepam) .... One A Day As Needed 9)  Albuterol 90 Mcg/act  Aers (Albuterol) .... 2 Puffs Every 4 Hrs As Needed 10)  Afrin Nasal Spray 0.05 %  Soln (Oxymetazoline Hcl) .... As Needed 11)  Tylenol Extra Strength 500 Mg  Tabs (Acetaminophen) .... As Needed 12)  Zovirax 5 % Oint (Acyclovir) .... Apply Four Times A Day 13)  Chlor-Trimeton 4 Mg Tabs (Chlorpheniramine Maleate)  .... As Directed On Bottle As Needed  Current Medications (verified): 1)  Singulair 10 Mg  Tabs (Montelukast Sodium) .... Once Daily 2)  Simvastatin 80 Mg Tabs (Simvastatin) .Antonio Carlson.. 1 At Bedtime 3)  Symbicort 160-4.5 Mcg/act  Aero (Budesonide-Formoterol Fumarate) .... Two Puffs Every 12 Hours 4)  Centrum   Tabs (Multiple Vitamins-Minerals) .... Once Daily 5)  Omeprazole 20 Mg  Cpdr (Omeprazole) .... Once Daily 6)  Calcium 500 500 Mg  Tabs (Calcium Carbonate) .... Once Daily 7)  Lopressor 50 Mg Tabs (Metoprolol Tartrate) .... One Half Twice Daily 8)  Lorazepam 0.5 Mg  Tabs (Lorazepam) .... One A Day As Needed 9)  Albuterol 90 Mcg/act  Aers (Albuterol) .... 2 Puffs Every 4 Hrs As Needed 10)  Afrin Nasal Spray 0.05 %  Soln (Oxymetazoline Hcl) .... As Needed 11)  Tylenol Extra Strength 500 Mg  Tabs (Acetaminophen) .... As Needed 12)  Zovirax 5 % Oint (Acyclovir) .... Apply Four Times A Day 13)  Chlor-Trimeton 4 Mg Tabs (Chlorpheniramine Maleate) .... As Directed On Bottle As Needed  Allergies (verified): 1)  ! Asa 2)  Augmentin 3)  Amoxicillin  Past History:  Past Medical History: Last updated: 01/23/2010 PVCs.  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) ALLERGIC RHINITIS, CHRONIC (ICD-477.9)........................[name]      - steroid dep until mid oct 2009      - Allergy profile sent January 23, 2010 >> IgE 18.3  BENIGN PROSTATIC HYPERTROPHY, HX OF (ICD-V13.8) DIVERTICULOSIS, MILD (ICD-562.10)................................Antonio KitchenLina Sar     - flex sig 08/12/00     - letter sent 06/05/05, reminded May 17, 2008  NEPHROLITHIASIS (ICD-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  siblings healthy mother still living without ascvd  Social History: Last updated: 07/23/2009 quit smoking 1971 rarely drink alcohol Retired  Risk Factors: Smoking Status: quit (07/25/2010)  Review of Systems      See HPI  Vital Signs:  Patient profile:   65 year old male Height:      70 inches Weight:      195 pounds BMI:     28.08 O2 Sat:      95 % on Room air Temp:     96.7 degrees F oral Pulse rate:   70 / minute BP sitting:   120 / 70  (left arm)  Vitals Entered By: Vernie Murders (July 25, 2010 10:53 AM)  O2 Flow:  Room air CC: Acute visit.  Pt c/o red, itchy, painful bumps on the top of his head- onset was 2-3 days ago. Is Patient Diabetic? No Comments Medications reviewed with patient Daytime contact number verified with patient.    Physical Exam  Additional Exam:  wt  198 May 17, 2008 > 198 June 27, 2008 >   197 July 23, 2009 > 189 January 23, 2010 >>195 12/30  Ambulatory healthy appearing wm  in no acute distress but striking  nasal tone to voice  HEENT: nl dentition, turbinates, and orophanx. Nl external ear canals without cough reflex Neck without JVD/Nodes/TM Lungs clear to A and P bilaterally without cough on insp or exp maneuvers RRR no s3 or murmur or increase in P2 Abd soft and benign with nl excursion in the supine position. No bruits or organomegaly Ext warm without calf tenderness, cyanosis clubbing or edema Skin: grouped vesicular/papular rash along frontal scalp line on left radiating along temple     Impression & Recommendations:  Problem # 1:  HERPES ZOSTER (ICD-053.9) Left scalp herpes zoster  Plan:  Valtrex 1gram three  times a day for 7days Prednisone taper over next week.  Vicodin 1 every 4-6 hr for severe pain, may make you sleepy.  Please contact office for sooner follow up if symptoms do not improve or worsen  follow up 2 weeks and as needed   Medications Added to Medication List This Visit: 1)  Valtrex 1 Gm Tabs (Valacyclovir hcl) .Antonio Carlson.. 1 by mouth three times a day 2)  Prednisone 10 Mg Tabs (Prednisone) .... 4 tabs for 2 days, then 3 tabs for 2 days, 2 tabs for 2 days, then 1 tab for 2 days, then stop 3)  Vicodin 5-500 Mg Tabs (Hydrocodone-acetaminophen) .Antonio Carlson.. 1 every 4 hr as needed severe pain  Other Orders: Est. Patient Level IV ([address]109)  Patient  Instructions: 1)  Valtrex 1gram three times a day for 7days 2)  Prednisone taper over next week.  3)  Vicodin 1 every 4-6 hr for severe pain, may make you sleepy.  4)  Please contact office for sooner follow up if symptoms do not improve or worsen  5)  follow up 2 weeks and as needed  Prescriptions: SYMBICORT 160-4.5 MCG/ACT  AERO (BUDESONIDE-FORMOTEROL FUMARATE) Two puffs every 12 hours  #1 x 3   Entered and Authorized by:   Rubye Oaks NP   Signed by:   Kayonna Lawniczak NP on 07/25/2010   Method used:   Electronically to        [id number] (retail)       [address]       [place], [place]  [address]       Ph: [phone]       Fax: [phone]   RxID:   [id number] SINGULAIR 10 MG  TABS (MONTELUKAST SODIUM) once daily  #30 x 3   Entered and Authorized by:   Rubye Oaks NP   Signed by:   Javoris Star NP on 07/25/2010   Method used:   Electronically to        [id number] (retail)       [address]       [place], [place]  [address]       Ph: [phone]       Fax: [phone]   RxID:   [id number] OMEPRAZOLE 20 MG  CPDR (OMEPRAZOLE) once daily  #90 x 0   Entered and Authorized by:   Rubye Oaks NP   Signed by:   Khori Rosevear NP on 07/25/2010   Method  used:   Electronically to        [id number] ([facility])       [address]       [place], [place]  [address]       Ph: [phone]       Fax: [phone]   RxID:   [id number] VICODIN 5-500 MG TABS (HYDROCODONE-ACETAMINOPHEN) 1 every 4 hr as needed severe pain  #20 x 0   Entered and Authorized by:   Rubye Oaks NP   Signed by:   Sierra Bissonette NP on 07/25/2010   Method used:   Print then Give to Patient   RxID:   [id number] PREDNISONE 10 MG TABS (PREDNISONE) 4 tabs for 2 days, then 3 tabs for 2 days, 2 tabs for 2 days, then 1 tab for 2 days, then stop  #20 x 0   Entered and Authorized by:   Rubye Oaks NP   Signed by:   Mae Cianci NP on 07/25/2010   Method used:   Electronically to        [id number] ([facility]       [address]       [place], [place]  [address]       Ph: [phone]       Fax: [phone]   RxID:   [id number] VALTREX 1 GM TABS (VALACYCLOVIR HCL) 1 by mouth three times a day  #21 x 0   Entered and Authorized by:   Rubye Oaks NP   Signed  by:   Rubye Oaks NP on 07/25/2010   Method used:   Electronically to        [id number] ([facility]       [address]       [place], [place]  [address]       Ph: [phone]       Fax: [phone]   RxID:   [id number]

## 2010-09-01 ENCOUNTER — Telehealth (INDEPENDENT_AMBULATORY_CARE_PROVIDER_SITE_OTHER): Payer: Self-pay | Admitting: *Deleted

## 2010-09-11 ENCOUNTER — Ambulatory Visit (INDEPENDENT_AMBULATORY_CARE_PROVIDER_SITE_OTHER): Payer: Medicare Other | Admitting: Adult Health

## 2010-09-11 ENCOUNTER — Encounter: Payer: Self-pay | Admitting: Adult Health

## 2010-09-11 ENCOUNTER — Telehealth (INDEPENDENT_AMBULATORY_CARE_PROVIDER_SITE_OTHER): Payer: Self-pay | Admitting: *Deleted

## 2010-09-11 DIAGNOSIS — R109 Unspecified abdominal pain: Secondary | ICD-10-CM

## 2010-09-11 DIAGNOSIS — K921 Melena: Secondary | ICD-10-CM

## 2010-09-11 NOTE — Progress Notes (Signed)
Summary: refills  Phone Note From Pharmacy   Caller: Antonio Carlson with [facility] Call For: Antonio Carlson  Summary of Call: Antonio Carlson with [facility] Mr. Antonio Carlson has transferred his prescriptions to them and he is out of refills on Omeprazole, and Simvastatin 80 mg, She needs new prescriptions for these called into [phone]  Initial call taken by: Vedia Coffer,  September 01, 2010 12:00 PM    Prescriptions: OMEPRAZOLE 20 MG  CPDR (OMEPRAZOLE) once daily  #30 x 5   Entered by:   Vernie Murders   Authorized by:   [name] MD   Signed by:   Vernie Murders on 09/01/2010   Method used:   Electronically to        [facility]* (retail)       [address]       [place], [place]  [address]       Ph: [phone]       Fax: [phone]   RxID:   [id number] SIMVASTATIN 80 MG TABS (SIMVASTATIN) 1 at bedtime  #30 x 5   Entered by:   Vernie Murders   Authorized by:   [name] MD   Signed by:   Vernie Murders on 09/01/2010   Method used:   Electronically to        [facility]* (retail)       [address]       [place], [place]  [address]       Ph: [phone]       Fax: [phone]   RxID:   [id number]

## 2010-09-12 ENCOUNTER — Telehealth: Payer: Self-pay | Admitting: Internal Medicine

## 2010-09-12 ENCOUNTER — Other Ambulatory Visit: Payer: Self-pay | Admitting: Internal Medicine

## 2010-09-12 ENCOUNTER — Encounter (INDEPENDENT_AMBULATORY_CARE_PROVIDER_SITE_OTHER): Payer: Self-pay | Admitting: *Deleted

## 2010-09-12 ENCOUNTER — Ambulatory Visit (INDEPENDENT_AMBULATORY_CARE_PROVIDER_SITE_OTHER)
Admission: RE | Admit: 2010-09-12 | Discharge: 2010-09-12 | Disposition: A | Discharge status: Home / Self Care | Payer: Medicare Other | Source: Ambulatory Visit | Attending: Internal Medicine | Admitting: Internal Medicine

## 2010-09-12 ENCOUNTER — Encounter: Payer: Self-pay | Admitting: Adult Health

## 2010-09-12 ENCOUNTER — Other Ambulatory Visit: Payer: Self-pay | Admitting: Adult Health

## 2010-09-12 ENCOUNTER — Other Ambulatory Visit: Payer: Medicare Other

## 2010-09-12 DIAGNOSIS — K921 Melena: Secondary | ICD-10-CM

## 2010-09-12 DIAGNOSIS — R109 Unspecified abdominal pain: Secondary | ICD-10-CM

## 2010-09-12 LAB — CBC WITH DIFFERENTIAL/PLATELET
Basophils Absolute: 0.1 10*3/uL (ref 0.0–0.1)
Lymphocytes Relative: 22.2 % (ref 12.0–46.0)
Lymphs Abs: 2.7 10*3/uL (ref 0.7–4.0)
MCHC: 33.4 g/dL (ref 30.0–36.0)
MCV: 87.1 fl (ref 78.0–100.0)
Monocytes Absolute: 0.9 10*3/uL (ref 0.1–1.0)
Monocytes Relative: 7.8 % (ref 3.0–12.0)
Neutro Abs: 7.4 10*3/uL (ref 1.4–7.7)
Platelets: 257 10*3/uL (ref 150.0–400.0)
RDW: 14.8 % — ABNORMAL HIGH (ref 11.5–14.6)
WBC: 12 10*3/uL — ABNORMAL HIGH (ref 4.5–10.5)

## 2010-09-12 LAB — URINALYSIS, ROUTINE W REFLEX MICROSCOPIC
Ketones, ur: NEGATIVE
Leukocytes, UA: NEGATIVE
Nitrite: NEGATIVE
Specific Gravity, Urine: <=1.005 (ref 1.000–1.030)
Total Protein, Urine: NEGATIVE
Urine Glucose: NEGATIVE
pH: 6 (ref 5.0–8.0)

## 2010-09-12 LAB — BASIC METABOLIC PANEL
Creatinine, Ser: 1 mg/dL (ref 0.4–1.5)
Potassium: 4.9 mEq/L (ref 3.5–5.1)

## 2010-09-12 LAB — HEPATIC FUNCTION PANEL
ALT: 19 U/L (ref 0–53)
Albumin: 3.9 g/dL (ref 3.5–5.2)
Alkaline Phosphatase: 81 U/L (ref 39–117)
Bilirubin, Direct: 0.2 mg/dL (ref 0.0–0.3)

## 2010-09-15 ENCOUNTER — Encounter: Payer: Self-pay | Admitting: Physician Assistant

## 2010-09-15 ENCOUNTER — Encounter: Payer: Self-pay | Admitting: Internal Medicine

## 2010-09-15 ENCOUNTER — Ambulatory Visit (INDEPENDENT_AMBULATORY_CARE_PROVIDER_SITE_OTHER): Payer: Medicare Other | Admitting: Physician Assistant

## 2010-09-15 DIAGNOSIS — I10 Essential (primary) hypertension: Secondary | ICD-10-CM | POA: Insufficient documentation

## 2010-09-15 DIAGNOSIS — R1032 Left lower quadrant pain: Secondary | ICD-10-CM | POA: Insufficient documentation

## 2010-09-15 DIAGNOSIS — K625 Hemorrhage of anus and rectum: Secondary | ICD-10-CM

## 2010-09-15 DIAGNOSIS — R1031 Right lower quadrant pain: Secondary | ICD-10-CM | POA: Insufficient documentation

## 2010-09-15 HISTORY — DX: Hemorrhage of anus and rectum: K62.5

## 2010-09-17 NOTE — Progress Notes (Signed)
Summary: blood in stool  Phone Note Call from Patient   Caller: Patient Call For: wert Summary of Call: Patient phoned stated that night before last he started having dirrhea and it has stopped but he had severe stomach pains and gas pains. Yesterday morning when he wiped there is bright red blood on the toilet paper. Patient wants to know if Dr Sherene Sires wants to see him or refer him to someone else. Patient can be reached on his cell at [phone] his brother is in the hosptial and he is planning on going to see him this morning.   Initial call taken by: Vedia Coffer,  September 11, 2010 8:33 AM  Follow-up for Phone Call        Spoke with patient-he is coming in to see TP at 415pm.[facility] CMA  September 11, 2010 12:23 PM

## 2010-09-17 NOTE — Assessment & Plan Note (Signed)
Summary: Acute NP office visit - bloody stools.   Primary Provider/Referring Provider:  Sherene Sires  CC:  diarrhea x2days and began seeing bright red blood w/ wiping 1day ago.  still having some bloating and and loose stools.  denies pain or straining associated w/ constipation.  History of Present Illness: 65 year old male with history  of Triad Asthma and hyperlipidemia   January 23, 2010 6 month followup.  Pt fasting.  Pt c/o nasal congestion and states "always have had this problem"- he is able to blow nose sometimes and mucus is yellow.  No other complaints today. due f/u with ent.  no cp/tia.    07/25/10- Presents for an acute office visit. Complains of  red, itchy, painful bumps on the top of his head- onset was 2-3 days ago. Noticed a tingling burning sensation along left side of scalp line. >>Dx w/ herpes zoster-rx Valtrex  August 12, 2010 --Presents for follow up of shingles. He is feeling better , rash is gone, does c/o tingling sensation when touched but is much better. Finished Valtrex. Denies chest pain, dyspnea, orthopnea, hemoptysis, fever, n/v/d, edema, headache.   September 11, 2010 --Presents for an acute office visit. Says that 2 days ago he had sharp quite severe pain in lower abdominal mainly in LLQ. This resolved after couple hours with some residual soreness initially. He than begin with bloating, loose stools. Had several loose stools that progressed to watery diarrhea yesterday. Last night and this am stool frequency decresed. Only 1 stool today -soft to loose. Stools dark and noticed bright red blood on tissue with wiping.He had a very similar not as severe episode 6 months ago that resolved on its own. NO weight loss, chest pain, dyspena , recent travel or abx use.  No urinary symptoms, n/v or fever.[name] Kitchen He ate nml lunch today without trouble.  NO known sick contacts. Last flex sig 2002 showed diverticulosis. Recent shingles with full treatment and resolution of symptoms.    Medications Prior to Update: 1)  Singulair 10 Mg  Tabs (Montelukast Sodium) .... Once Daily 2)  Simvastatin 80 Mg Tabs (Simvastatin) .[name] Kitchen.. 1 At Bedtime 3)  Symbicort 160-4.5 Mcg/act  Aero (Budesonide-Formoterol Fumarate) .... Two Puffs Every 12 Hours 4)  Centrum   Tabs (Multiple Vitamins-Minerals) .... Once Daily 5)  Omeprazole 20 Mg  Cpdr (Omeprazole) .... Once Daily 6)  Calcium 500 500 Mg  Tabs (Calcium Carbonate) .... Once Daily 7)  Lopressor 50 Mg Tabs (Metoprolol Tartrate) .... One Half Twice Daily 8)  Lorazepam 0.5 Mg  Tabs (Lorazepam) .... One A Day As Needed 9)  Albuterol 90 Mcg/act  Aers (Albuterol) .... 2 Puffs Every 4 Hrs As Needed 10)  Afrin Nasal Spray 0.05 %  Soln (Oxymetazoline Hcl) .... As Needed 11)  Tylenol Extra Strength 500 Mg  Tabs (Acetaminophen) .... As Needed 12)  Zovirax 5 % Oint (Acyclovir) .... Apply Four Times A Day As Needed 13)  Chlor-Trimeton 4 Mg Tabs (Chlorpheniramine Maleate) .... As Directed On Bottle As Needed 14)  Vicodin 5-500 Mg Tabs (Hydrocodone-Acetaminophen) .[name] Kitchen.. 1 Every 4 Hr As Needed Severe Pain  Current Medications (verified): 1)  Singulair 10 Mg  Tabs (Montelukast Sodium) .... Once Daily 2)  Simvastatin 80 Mg Tabs (Simvastatin) .[name] Kitchen.. 1 At Bedtime 3)  Symbicort 160-4.5 Mcg/act  Aero (Budesonide-Formoterol Fumarate) .... Two Puffs Every 12 Hours 4)  Centrum   Tabs (Multiple Vitamins-Minerals) .... Once Daily 5)  Omeprazole 20 Mg  Cpdr (Omeprazole) .... Once Daily 6)  Calcium  500 500 Mg  Tabs (Calcium Carbonate) .... Once Daily 7)  Lopressor 50 Mg Tabs (Metoprolol Tartrate) .... One Half Twice Daily 8)  Lorazepam 0.5 Mg  Tabs (Lorazepam) .... One A Day As Needed 9)  Albuterol 90 Mcg/act  Aers (Albuterol) .... 2 Puffs Every 4 Hrs As Needed 10)  Afrin Nasal Spray 0.05 %  Soln (Oxymetazoline Hcl) .... As Needed 11)  Tylenol Extra Strength 500 Mg  Tabs (Acetaminophen) .... As Needed 12)  Zovirax 5 % Oint (Acyclovir) .... Apply Four Times A Day As  Needed 13)  Chlor-Trimeton 4 Mg Tabs (Chlorpheniramine Maleate) .... As Directed On Bottle As Needed  Allergies (verified): 1)  ! Asa 2)  ! Sulfa 3)  Augmentin 4)  Amoxicillin  Past History:  Past Medical History: Last updated: 01/23/2010 PVCs.  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) ALLERGIC RHINITIS, CHRONIC (ICD-477.9)........................[name]      - steroid dep until mid oct 2009      - Allergy profile sent January 23, 2010 >> IgE 18.3  BENIGN PROSTATIC HYPERTROPHY, HX OF (ICD-V13.8) DIVERTICULOSIS, MILD (ICD-562.10)................................[name] KitchenLina Sar     - flex sig 08/12/00     - letter sent 06/05/05, reminded May 17, 2008  NEPHROLITHIASIS (ICD-592.0) HYPERLIPIDEMIA (ICD-272.4) target < 130 male, pos fm hx, h/l smoking HEALTH MAINTENANCE.......................................................[name] KitchenWert   - CPX  July 23, 2009    - Pneumovax 5/06  and @ age 55 June 11, 2009    - Td 1/07     Family History: Last updated: 10/12/2008  heart disease in his father at age 69 he was a smoker positive for coronary artery disease in his   father.  siblings healthy mother still living without ascvd  Social History: Last updated: 07/23/2009 quit smoking 1971 rarely drink alcohol Retired  Risk Factors: Smoking Status: quit (08/08/2010)  Review of Systems      See HPI  Vital Signs:  Patient profile:   65 year old male Height:      70 inches Weight:      196.50 pounds BMI:     28.30 O2 Sat:      97 % on Room air Temp:     97.7 degrees F oral Pulse rate:   70 / minute BP sitting:   106 / 74  (left arm) Cuff size:   regular  Vitals Entered By: Boone Master CNA/MA (September 11, 2010 5:05 PM)  O2 Flow:  Room air CC: diarrhea x2days and began seeing bright red blood w/  wiping 1day ago.  still having some bloating, and loose stools.  denies pain or straining associated w/ constipation Is Patient Diabetic? No Comments Medications reviewed with patient Daytime contact number verified with patient. Boone Master CNA/MA  September 11, 2010 5:04 PM    Physical Exam  Additional Exam:  wt  198 May 17, 2008 > 198 June 27, 2008 >   197 July 23, 2009 > 189 January 23, 2010 >>195 12/30 >>195 1/13>>196 September 11, 2010  Ambulatory healthy appearing wm  in no acute distress   HEENT: nl dentition, turbinates, and orophanx. Nl external ear canals Neck without JVD/Nodes/TM Lungs clear to A and P bilaterally without cough on insp or exp maneuvers RRR no s3 or murmur or increase in P2 Abd soft and  benign with nl excursion in the supine position. No bruits or organomegaly, no gurading or rebound, neg CVA tenderness.  RECTAL: empty valut, positive occult stools Ext warm without calf tenderness, cyanosis clubbing or edema Skin: clear    Impression & Recommendations:  Problem # 1:  BLOOD IN STOOL (ICD-578.1) Bloody  stool ?etiology possible mild  ischemic colitis with hx of sudden onset of abdominal pain and bloody stools. vs diverticulitis vs viral illness  Seems to be resolving with no further abd pain ABD films are neg. CBC with sl bump in wbc and no left shift. low ESR.  Exam unrevealing  Plan; will place on bowel rest with slow diet progression with liquids  refer to GI. and monitor closely.  pt aware to call back if not improving or worsens.      Orders: Gastroenterology Referral (GI) Est. Patient Level IV (99214)Future Orders: T-Urine Culture (Spectrum Order) [id number]) ... 09/12/2010 T-Abdomen 2-view (74020TC) ... 09/12/2010 TLB-CBC Platelet - w/Differential (85025-CBCD) ... 09/12/2010 TLB-BMP (Basic Metabolic Panel-BMET) (80048-METABOL) ... 09/12/2010 TLB-Hepatic/Liver Function Pnl (80076-HEPATIC) ... 09/12/2010 TLB-Udip w/ Micro  (81001-URINE) ... 09/12/2010 TLB-Sedimentation Rate (ESR) (85652-ESR) ... 09/12/2010  Patient Instructions: 1)  Advance slowly, begin with liquids for next 3 daysand advance slowly.  2)  Avoid spicy , fried foods  for next 2 week. 3)  Fluids and rest 4)  We are referring you to Gastroenterologist to evaluate  5)  Return tomorrow for labs and xray  6)  Please contact office for sooner follow up if symptoms do not improve or worsen  7)  follow up Dr. Sherene Sires as planned.

## 2010-09-17 NOTE — Progress Notes (Signed)
Summary: Triage   Phone Note Other Incoming   Caller: [facility] in Pulmonary [phone] Summary of Call: LLQ pain, blood in stool.....requesting pt. be seen and no appt. w/Dr. Juanda Chance avail. Initial call taken by: Karna Christmas,  September 12, 2010 12:23 PM  Follow-up for Phone Call        Left message for patient to call back Darcey Nora RN, [facility]  September 12, 2010 3:23 PM   I spoke with Almyra Free, Patient is scheduled with Mike Gip PA for 09/15/10 at 1:30 Follow-up by: Darcey Nora RN, CGRN,  September 12, 2010 3:28 PM

## 2010-09-23 NOTE — Assessment & Plan Note (Addendum)
Summary: blood in stool, LLQ pain/sheri    History of Present Illness Visit Type: Initial Consult Primary GI MD: [name] MD Primary Provider: Sandrea Hughs MD Requesting Provider: Rubye Oaks PA Chief Complaint: rectal bleeding after episode of diarrhea History of Present Illness:   VERY NICE 65 YO MALE KNOWN REMOTLEY TO DR. Juanda Chance FROM SCREENING FLEX-SIGMOID IN 2002. THIS SHOWED DIVERTICULOSIS. PT IS REFERRED NOW AFTER AN EPISODE LAST WEEK WITH ACUTE LOWER ABDOMINAL  PAIN WHICH WAS FAIRLY INTENS-;LASTED  A COUPLE HOURS THEN RESOLVED WITH ONSET OF DIARRHEA. HE HAD NO ASSOCIATED NAUSEA/VOMITING/DIAPHORESIS.HE HAD SEVERAL EPISODES OF DIARRHEA THE BEGAN SEEING BRB MIXED WITH THE DIARRHEA. HE HAD NO FURTHER PAIN OR CRAMPING. HE HAD A FEW EPISODES OF BRB THE FOLLOWING DAY WHICH THEN RESOLVED AS WELL.  HE WAS SEEN BY TAMMY PARROTT-[facility] LABS DONE SHOWING WBC 12.0,HGB 15.8. CMET NORMAL. KUB NEGATIVE. HE FEELS FINE NOW,BACK TO NORMAL BM'S.   GI Review of Systems    Reports abdominal pain, acid reflux, and  heartburn.     Location of  Abdominal pain: lower abdomen.    Denies belching, bloating, chest pain, dysphagia with liquids, dysphagia with solids, loss of appetite, nausea, vomiting, vomiting blood, weight loss, and  weight gain.      Reports diarrhea and  rectal bleeding.     Denies anal fissure, black tarry stools, change in bowel habit, constipation, diverticulosis, fecal incontinence, heme positive stool, hemorrhoids, irritable bowel syndrome, jaundice, light color stool, liver problems, and  rectal pain.    Current Medications (verified): 1)  Singulair 10 Mg  Tabs (Montelukast Sodium) .... Once Daily 2)  Simvastatin 80 Mg Tabs (Simvastatin) .[name] Kitchen.. 1 At Bedtime 3)  Symbicort 160-4.5 Mcg/act  Aero (Budesonide-Formoterol Fumarate) .... Two Puffs Every 12 Hours 4)  Centrum   Tabs (Multiple Vitamins-Minerals) .... Once Daily 5)  Omeprazole 20 Mg  Cpdr (Omeprazole) .... Once Daily 6)   Calcium 500 500 Mg  Tabs (Calcium Carbonate) .... Once Daily 7)  Lopressor 50 Mg Tabs (Metoprolol Tartrate) .... One Half Twice Daily 8)  Lorazepam 0.5 Mg  Tabs (Lorazepam) .... One A Day As Needed 9)  Albuterol 90 Mcg/act  Aers (Albuterol) .... 2 Puffs Every 4 Hrs As Needed 10)  Afrin Nasal Spray 0.05 %  Soln (Oxymetazoline Hcl) .... As Needed 11)  Tylenol Extra Strength 500 Mg  Tabs (Acetaminophen) .... As Needed 12)  Zovirax 5 % Oint (Acyclovir) .... Apply Four Times A Day As Needed 13)  Chlor-Trimeton 4 Mg Tabs (Chlorpheniramine Maleate) .... As Directed On Bottle As Needed  Allergies (verified): 1)  ! Asa 2)  ! Sulfa 3)  Augmentin 4)  Amoxicillin  Past History:  Past Medical History: PVCs.  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  heart disease in his father at age 67 he was a smoker positive for coronary artery disease in his   father.  siblings healthy mother still living without ascvd  Social History: Reviewed history from 07/23/2009 and no changes required. quit smoking 1971  rarely drink alcohol Retired  Review of Systems       The patient complains of allergy/sinus and back pain.  The patient denies anemia, anxiety-new, arthritis/joint pain, blood in urine, breast changes/lumps, change in vision, confusion, cough, coughing up blood, depression-new, fainting, fatigue, fever, headaches-new, hearing problems, heart murmur, heart rhythm changes, itching, menstrual pain, muscle pains/cramps, night sweats, nosebleeds, pregnancy symptoms, shortness of breath, skin rash, sleeping problems, sore throat, swelling of feet/legs, swollen lymph glands, thirst - excessive , urination - excessive , urination changes/pain, urine leakage, vision changes, and voice change.         SEE HPI  Vital Signs:  Patient profile:   65 year old male Height:      70 inches Weight:      188 pounds BMI:     27.07 Pulse rate:   68 / minute Pulse rhythm:   regular BP sitting:   110 / 60  (right arm)  Vitals Entered By: Chales Abrahams CMA Duncan Dull) (September 15, 2010 1:26 PM)  Physical Exam  General:  Well developed, well nourished, no acute distress. Head:  Normocephalic and atraumatic. Eyes:  PERRLA, no icterus. Lungs:  Clear throughout to auscultation. Heart:  Regular rate and rhythm; no murmurs, rubs,  or bruits. Abdomen:  SOFT, NONTENDER, NO MASS OR HSM,BS+ Rectal:  NOT DONE,HEME POSITIVE 2/16 Extremities:  No clubbing, cyanosis, edema or deformities noted. Neurologic:  Alert and  oriented x4;  grossly normal neurologically. Psych:  Alert and cooperative. Normal mood and affect.   Impression & Recommendations:  Problem # 1:  RECTAL BLEEDING (ICD-569.3) Assessment New 65 YO MALE WITH SELF-LIMITED EPISODE OF ACUTE LOWER ABDOMINAL PAIN,DIARRHEA AND BRB PER RECTUM. R/O MILD SEGMENTAL ISCHEMIC COLITIS. R/O DIVERTICULAR,R/O INFECTIOUS ETIOLOGY.  PT HAS NEVER HAD FULL COLONOSCOPY   ADVANCE TO REGULAR DIET SCHEDULE FOR COLONOSCOPY WITH DR. Hermelinda Medicus DISCUSSED IN DETAIL WITH  PT ADVISED TO CALL FOR ANY RECURRENCE OF SXS IN THE INTERIM.  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 Juanda Chance on 09-30-2010. 2)  Directions and brochure provided. 3)  [facility] Patient Information Guide given to patient. 4)  We sent the perscription for the colonoscopy prep to [facility]. 5)  Copy sent to : Dr Sandrea Hughs 6)                         Tammy Parrett NP 7)  The medication list was reviewed and reconciled.  All changed / newly prescribed medications were explained.  A complete medication list was provided to the patient / caregiver. Prescriptions: MOVIPREP 100 GM  SOLR (PEG-KCL-NACL-NASULF-NA ASC-C) As per prep instructions.  #1 x 0   Entered by:   Lowry Ram NCMA   Authorized by:   Sammuel Cooper PA-c   Signed by:  Pam Peterman NCMA on 09/15/2010   Method used:   Electronically to        [facility]* (retail)       [address]       [place], [place]  [address]       Ph: [phone]       Fax: [phone]   RxID:   [id number]

## 2010-09-23 NOTE — Letter (Signed)
Summary: [facility] Instructions  [facility]  [address] [place], [place] [address]   Phone: [phone]  Fax: [phone]       [facility]    1946/05/24    MRN: [id number]        Procedure Day /Date:09-30-2010     Arrival Time:12:30 PM      Procedure Time:1:30 PM     Location of Procedure:                    X    [facility] (4th Floor)  PREPARATION FOR COLONOSCOPY WITH MOVIPREP   Starting 5 days prior to your procedure 09-25-2010 do not eat nuts, seeds, popcorn, corn, beans, peas,  salads, or any raw vegetables.  Do not take any fiber supplements (e.g. Metamucil, Citrucel, and Benefiber).  THE DAY BEFORE YOUR PROCEDURE         DATE: 09-29-2010  DAY: Monday  1.  Drink clear liquids the entire day-NO SOLID FOOD  2.  Do not drink anything colored red or purple.  Avoid juices with pulp.  No orange juice.  3.  Drink at least 64 oz. (8 glasses) of fluid/clear liquids during the day to prevent dehydration and help the prep work efficiently.  CLEAR LIQUIDS INCLUDE: Water Jello Ice Popsicles Tea (sugar ok, no milk/cream) Powdered fruit flavored drinks Coffee (sugar ok, no milk/cream) Gatorade Juice: apple, white grape, white cranberry  Lemonade Clear bullion, consomm, broth Carbonated beverages (any kind) Strained chicken noodle soup Hard Candy                             4.  In the morning, mix first dose of MoviPrep solution:    Empty 1 Pouch A and 1 Pouch B into the disposable container    Add lukewarm drinking water to the top line of the container. Mix to dissolve    Refrigerate (mixed solution should be used within 24 hrs)  5.  Begin drinking the prep at 5:00 p.m. The MoviPrep container is divided by 4 marks.   Every 15 minutes drink the solution down to the next mark (approximately 8 oz) until the full liter is complete.   6.  Follow completed prep with 16 oz of clear liquid of your choice (Nothing red or purple).  Continue to  drink clear liquids until bedtime.  7.  Before going to bed, mix second dose of MoviPrep solution:    Empty 1 Pouch A and 1 Pouch B into the disposable container    Add lukewarm drinking water to the top line of the container. Mix to dissolve    Refrigerate  THE DAY OF YOUR PROCEDURE      DATE: 09-30-2010 [id number]  Beginning at 8:30 AM  (5 hours before procedure):         1. Every 15 minutes, drink the solution down to the next mark (approx 8 oz) until the full liter is complete.  2. Follow completed prep with 16 oz. of clear liquid of your choice.    3. You may drink clear liquids until 11:30 AM  (2 HOURS BEFORE PROCEDURE).   MEDICATION INSTRUCTIONS  Unless otherwise instructed, you should take regular prescription medications with a small sip of water   as early as possible the morning of your procedure.       OTHER INSTRUCTIONS  You will need a responsible adult at least 67 years of age  to accompany you and drive you home.   This person must remain in the waiting room during your procedure.  Wear loose fitting clothing that is easily removed.  Leave jewelry and other valuables at home.  However, you may wish to bring a book to read or  an iPod/MP3 player to listen to music as you wait for your procedure to start.  Remove all body piercing jewelry and leave at home.  Total time from sign-in until discharge is approximately 2-3 hours.  You should go home directly after your procedure and rest.  You can resume normal activities the  day after your procedure.  The day of your procedure you should not:   Drive   Make legal decisions   Operate machinery   Drink alcohol   Return to work  You will receive specific instructions about eating, activities and medications before you leave.    The above instructions have been reviewed and explained to me by   _______________________    I fully understand and can verbalize these instructions  _____________________________ Date _________

## 2010-09-30 ENCOUNTER — Other Ambulatory Visit: Payer: Medicare Other | Admitting: Internal Medicine

## 2010-10-15 ENCOUNTER — Telehealth: Payer: Self-pay | Admitting: Internal Medicine

## 2010-10-15 NOTE — Telephone Encounter (Signed)
Pt changed his Colonoscopy appt to 10-28-2010. He needed me to go over his times which changed .  Pt understood my instructions. He thanked me for calling.

## 2010-10-27 ENCOUNTER — Encounter: Payer: Self-pay | Admitting: Internal Medicine

## 2010-10-28 ENCOUNTER — Encounter: Payer: Self-pay | Admitting: Internal Medicine

## 2010-10-28 ENCOUNTER — Ambulatory Visit: Payer: Medicare Other | Admitting: Internal Medicine

## 2010-10-28 VITALS — BP 137/84 | HR 55 | Temp 97.0°F | Resp 18 | Ht 70.0 in | Wt 178.0 lb

## 2010-10-28 DIAGNOSIS — Z1211 Encounter for screening for malignant neoplasm of colon: Secondary | ICD-10-CM

## 2010-10-28 DIAGNOSIS — K5289 Other specified noninfective gastroenteritis and colitis: Secondary | ICD-10-CM

## 2010-10-28 DIAGNOSIS — R933 Abnormal findings on diagnostic imaging of other parts of digestive tract: Secondary | ICD-10-CM

## 2010-10-28 DIAGNOSIS — K5730 Diverticulosis of large intestine without perforation or abscess without bleeding: Secondary | ICD-10-CM

## 2010-10-28 DIAGNOSIS — D126 Benign neoplasm of colon, unspecified: Secondary | ICD-10-CM

## 2010-10-28 NOTE — Patient Instructions (Signed)
Discharge instructions given with verbal understanding. Handout on Divreticulosis given.

## 2010-10-29 ENCOUNTER — Telehealth: Payer: Self-pay | Admitting: *Deleted

## 2010-10-29 NOTE — Telephone Encounter (Signed)

## 2010-11-05 ENCOUNTER — Encounter: Payer: Self-pay | Admitting: Internal Medicine

## 2010-12-09 NOTE — Assessment & Plan Note (Signed)
[facility]                            CARDIOLOGY OFFICE NOTE   NAME:Antonio Carlson, Antonio Carlson                       MRN:          [id number]  DATE:07/16/2008                            DOB:          Dec 18, 1945    Mr. Gilbo is a 65 year old gentleman that I recently saw on July 04, 2008, secondary to episodes of syncope.  Please refer to my note for  details.  We did schedule him to have a stress Myoview, which was  performed on July 05, 2008.  He exercised for 8 minutes and there  was no chest pain.  There were occasional PVCs.  There were no ST  changes and there was normal perfusion.  He had an echocardiogram  performed on July 05, 2008, that showed normal LV function.  There  was trivial aortic insufficiency and there was mild left atrial  enlargement.  Finally, he did have carotid Dopplers performed on  July 04, 2008.  There was 0-39% bilateral internal carotid artery  stenosis.  Also note, he had a CardioNet.  However, he can only wear it  5 days as it was apparently having significant technical difficulties.  He had no symptoms while the monitor was in place.  We are waiting  strips.  Since I saw him, he has not had any dyspnea, chest pain, or  further syncopal episodes.   MEDICATIONS:  1. Singulair 10 mg p.o. daily.  2. Vytorin 10/40 daily.  3. Symbicort.  4. Multivitamin.  5. Omeprazole.  6. Calcium.  7. Omnaris.  8. Lopressor 25 mg p.o. b.i.d.   PHYSICAL EXAMINATION:  VITAL SIGNS:  Today shows a blood pressure of  145/85, and his pulse is 67.  He weighs 194 pounds.  HEENT:  Normal.  NECK:  Supple.  CHEST:  Clear.  CARDIOVASCULAR:  Regular rate and rhythm.  ABDOMEN:  No tenderness.  EXTREMITIES:  No edema.   DIAGNOSES:  1. Recent syncopal episodes - The etiology of this remains unclear to      me.  His left ventricular function is normal and there is no      evidence of ischemia on his nuclear study.  His carotid Doppler  is      normal.  We will wait the strips from his CardioNet monitor, but      note, he had no symptoms while the monitor was in place.  If he has      further episodes in the future, then we may need to refer him to an      electrophysiologist for evaluation and consider an implantable loop      monitor.  2. Asthma - He will continue his present medications.  3. Abnormal chest CT - Management per Dr. Sherene Sires.  4. Hypertension - He will continue on his present dose of Lopressor.  5. Hyperlipidemia - He will continue his statin.  This is being      followed by Dr. Sherene Sires.  I will see him back in 3 months.     Madolyn Frieze Jens Som, MD, [facility]  Electronically Signed  BSC/MedQ  DD: 07/16/2008  DT: 07/16/2008  Job #: [id number]

## 2010-12-09 NOTE — Assessment & Plan Note (Signed)
Antonio Carlson                            CARDIOLOGY OFFICE NOTE   NAME:Antonio Carlson, Antonio Carlson                       MRN:          [id number]  DATE:07/04/2008                            DOB:          1946/04/12    Antonio Carlson is a 65 year old gentleman who I am asked to evaluate for  syncope.  He has no prior cardiac history.  He typically does not have  dyspnea on exertion, orthopnea, PND, pedal edema, palpitations, or  exertional chest pain.  In October 2009, the patient had an episode  while he was sitting down where he felt suddenly like he was going to  pass out.  There was no associated palpitations, nausea, vomiting,  chest pain, or shortness of breath.  It was not positional.  There was  no seizure activity nor was there any loss of strength or sensation in  his extremities.  The symptoms lasted for approximately 5 minutes and  resolved spontaneously.  He then went and sat down at a table and was  working on some papers.  He apparently suddenly had a syncopal episode  that his wife witnessed.  He had no preceding symptoms and felt fine  afterwards.  He was seen in the emergency room.  He had laboratories  drawn that showed a hemoglobin of 17.  His potassium was 4.3.  His renal  function was normal.  His liver functions were normal.  His CK-MB and  troponin were normal.  He also had a CT of his chest that showed no  pulmonary embolus.  Note there was enlarging node in the left lower lobe  and Dr. Sherene Sires is apparently aware of this.  Since that time, he has had  several episodes where he felt like he was going to pass out.  He did  have an episode while riding a car beside his son when he had an episode  and again he had a frank syncopal episode.  This was approximately one  and half weeks ago.  He has had no problems since then.  Because of the  above, we were asked to further evaluate.  Also, note the patient had  Holter monitor that showed sinus rhythm with  occasional PACs and PVCs.  There was a short burst of paroxysmal atrial tachycardia, 5 beats.  There was also a rare couplet noted.   MEDICATIONS:  1. Singulair 10 mg p.o. daily.  2. Vytorin 10/40 daily.  3. Symbicort.  4. Multivitamin.  5. Omeprazole.  6. Calcium.  7. Omnaris 50 mcg at bedtime.  8. Lopressor 25 mg p.o. b.i.d.   He also takes lorazepam, albuterol, Afrin, Tylenol, and Zovirax as  needed.   ALLERGIES:  He has an allergy to SULFA, ASPIRIN, and AMOXICILLIN.   SOCIAL HISTORY:  He has remote history of tobacco use but has not smoked  since the 1970s.  He rarely consumes alcohol.   His family history is positive for coronary artery disease in his  father.   His past medical history is significant for recently diagnosed  hypertension, but there is no diabetes mellitus.  He does have  hyperlipidemia.  He also has a history of asthma.  He has had a previous  appendectomy as well as sinus surgery.  He has a history of rhinitis.  He has had a previous fracture of his wrist.  He also has a history of  benign prostatic hypertrophy as well as diverticulosis.  There is a  history of nephrolithiasis.   REVIEW OF SYSTEMS:  He denies any headaches, fevers, or chills.  There  is no productive cough or hemoptysis.  There is no dysphagia,  odynophagia, melena, or hematochezia.  There is no dysuria or hematuria.  There is no rash or seizure activity.  There is no orthopnea, PND, or  pedal edema.  Remaining systems are negative.   PHYSICAL EXAMINATION:  VITAL SIGNS:  Today shows blood pressure 140/80  and the pulse is 68.  He weighs 194 pounds.  GENERAL:  He is well developed, well nourished in no acute distress.  SKIN:  Warm and dry.  He is not acutely depressed.  There is no  peripheral clubbing.  BACK:  Normal.  HEENT:  Normal with normal eyelids.  NECK:  Supple with normal carotid upstroke bilaterally.  There are no  bruits noted.  There is no jugular venous distention.  I  can not  appreciate thyromegaly.  CHEST:  Clear to auscultation.  Normal expansion.  CARDIOVASCULAR:  Regular rhythm.  Normal S1 and S2.  There are no  murmurs, rubs, or gallops noted.  ABDOMEN:  Nontender and nondistended.  Positive bowel sounds.  No  hepatosplenomegaly.  No mass appreciated.  There is no abdominal bruit.  EXTREMITIES:  He has 2+ femoral pulses bilaterally.  No bruits.  Extremities show no edema I can palpate.  No cords.  He has 2+ dorsalis  pedis pulses bilaterally.  NEUROLOGIC:  Grossly intact.   His electrocardiogram from June 27, 2008, is reviewed.  It showed  normal sinus rhythm at a rate of 65.  The axis is normal.  There are no  ST changes noted.  His QT is not prolonged.   DIAGNOSES:  1. Syncope - Etiology of this is unclear to me but is concerning for a      potential cardiac etiology.  We will schedule him to have an      echocardiogram to quantify his LV function as well as a stress      Myoview.  I will also schedule him to have a CardioNet monitor (He      had a recent 24-hour Holter, but he had no symptoms while the      monitor was in place).  We will also schedule him to have carotid      Dopplers.  I will have him return in 2 weeks and we will review the      above information.  Note, I have explained that he should not drive      until we evaluate this completely.  2. Asthma - He will continue on his present medications.  3. Abnormal chest CT - Management per Dr. Sherene Sires.  4. Hypertension - He will continue on his present dose of Lopressor.  5. Hyperlipidemia - Continue on his statin.     Madolyn Frieze Jens Som, MD, [facility]  Electronically Signed    BSC/MedQ  DD: 07/04/2008  DT: 07/05/2008  Job #: [id number]   cc:   Charlaine Dalton. Sherene Sires, MD, FCCP

## 2010-12-09 NOTE — Assessment & Plan Note (Signed)
[facility]                             PULMONARY OFFICE NOTE   NAME:Antonio Carlson, Antonio Carlson                       MRN:          [id number]  DATE:05/02/2007                            DOB:          1945-09-22    PRIMARY SERVICE/FOLLOW UP OFFICE VISIT:   HISTORY OF PRESENT ILLNESS:  A very nice 65 year old white male, former  smoker with triad asthma and chronic rhinitis/sinusitis doing great from  a pulmonary perspective but continues to have nasal drainage.  He  returns today on Symbicort 160/4.5 two puffs b.i.d. with virtually no  use of albuterol at all over the last three months.  No nocturnal  respiratory symptoms other than the nasal congestion as outlined above  for which he is under the care of Dr. [name], and no longer on any form  of prednisone other than topical.   MEDICATIONS:  For full inventory of medications, please see column dated  May 02, 2007.   PHYSICAL EXAMINATION:  GENERAL:  He is a pleasant, ambulatory white male  in no acute distress  VITAL SIGNS:  Stable.  HEENT:  Remarkable for moderate nonspecific term edema.  Oropharynx is  clear.  NECK:  Supple without cervical adenopathy or tenderness.  Trachea is  midline.  No thyromegaly.  LUNGS:  Lung fields completely clear bilaterally to auscultation and  percussion.  HEART:  Regular rhythm without murmurs, gallops, rubs.  ABDOMEN:  Soft, benign.  EXTREMITIES:  Warm without calf tenderness, cyanosis, clubbing or edema.   IMPRESSION:  1. Poorly controlled rhinitis.  He is already under maximum treatment      with topical nasal steroids and Singulair.  I deferred this issue      to Dr. [name].  2. Asthma is doing great.  In terms of control, meeting all the goals      of therapy.  I have recommended reducing the Symbicort down to      80/4.5 two puffs b.i.d. to reduce the possibility of adverse side      effects as well as reduce costs.  3. I did advise the patient that if he has  increased need for      albuterol or lower respiratory symptoms that he should go back to      the 160 dose.   Follow up will be in the context of comprehensive healthcare evaluation  due in February 2009.  Will see him sooner if needed.     Charlaine Dalton. Sherene Sires, MD, [facility]  Electronically Signed   MBW/MedQ  DD: 05/02/2007  DT: 05/03/2007  Job #: [id number]   cc:   Lucky Cowboy, MD

## 2010-12-09 NOTE — Assessment & Plan Note (Signed)
[facility]                            CARDIOLOGY OFFICE NOTE   NAME:Derryberry, CAI ANFINSON                       MRN:          [id number]  DATE:10/15/2008                            DOB:          07-Jun-1946    Mr. Ose is a 65 year old gentleman who I saw previously secondary to  syncope.  Previous Myoview showed normal perfusion and echocardiogram  showed normal LV function.  Carotid Dopplers showed no significant  stenosis.  The previous Holter showed occasional PVCs and PACs.  He did  wear an event monitor for 5 days, but it apparently had some technical  difficulties.  Since I last saw him on July 16, 2008, there has been  no further syncope.  He also denies any chest pain or shortness of  breath.  There is no pedal edema.  His medications are unchanged  compared to previous.   His physical exam shows a blood pressure of 134/82, his pulse is 63.  His HEENT is normal.  His neck is supple.  His chest is clear.  His  cardiovascular exam shows regular rate.  Abdomen shows no tenderness.  His extremities show no edema.   DIAGNOSES:  1. Syncope - the etiology of this remained unclear.  His workup today      has been negative including normal left ventricular function, no      ischemia, and no significant arrhythmias (note, the patient did not      have any episodes of dizziness while the monitor was in place).  He      has returned to driving.  If he has further episodes in future,      then we can consider a referral to an electrophysiologist for my      consideration of an implantable loop.  2. Asthma.  3. History of abnormal chest CT - managed per Dr. Sherene Sires.  4. Hypertension - his blood pressure is controlled.  5. Hyperlipidemia - he will continue his statin.  This is being      managed by his primary care physician.   I will see him back on an as-needed basis.     Madolyn Frieze Jens Som, MD, [facility]  Electronically Signed    BSC/MedQ  DD:  10/15/2008  DT: 10/15/2008  Job #: [id number]

## 2010-12-12 NOTE — Assessment & Plan Note (Signed)
[facility]                               PULMONARY OFFICE NOTE   NAME:Antonio Carlson, Antonio Carlson                       MRN:          [id number]  DATE:06/11/2006                            DOB:          12/22/1945    HISTORY OF PRESENT ILLNESS:  This is a very nice 65 year old white male  patient of Dr. Thurston Hole who has a known history of asthma maintained on  chronic steroids at 5 mg. The patient also has a history of chronic  eosinophilic rhinitis and hyperlipidemia. The patient returns today for a 6  week followup and medication review. The last visit the patient had been  complaining of some back pain and muscle aches. There was a question if he  was having an adverse reaction to Vytorin. He was discontinued off of this  for 2 weeks. However, the patient reports that his muscle aches did not  improve off of Vytorin. A CK level and liver enzymes were unremarkable labs  on April 30, 2006. The patient returns today and reports that his back is  doing somewhat better over the last couple of weeks. Muscle aches and back  seem to be doing better. Has been using some Vicodin.   LABORATORY DATA:  Total cholesterol 173, LDL 103 and HDL 45. He is currently  maintained on Vytorin 40/10 daily.   PAST MEDICAL HISTORY:  Reviewed.   CURRENT MEDICATIONS:  Reviewed.   PHYSICAL EXAMINATION:  GENERAL:  The patient is a pleasant male in no acute  distress.  VITAL SIGNS:  He is afebrile with stable vital signs.  HEENT:  Unremarkable.  NECK:  Supple without adenopathy.  LUNGS:  Clear to auscultation bilaterally.  CARDIAC:  Regular rate and rhythm.  ABDOMEN:  Soft and benign.  EXTREMITIES:  Warm without any edema.   IMPRESSION/PLAN:  1. Asthma and chronic eosinophilic rhinitis. The patient will continue on      his current regimen, follow back up with Dr. Sherene Sires in 2-3 months for a      complete physical examination.  2. Back pain and muscle aches. Seems to be improving.  Possibly underlying      osteoarthritis. The patient had previous CK level and liver enzymes      that were unremarkable. Symptoms did not improve off of statin therapy.      Therefore the patient is to restart his statin therapy.  3. Hyperlipidemia. The patient with an LDL target of less than 130. The      patient is currently optimally controlled and will continue on his      current regimen along with a low fat low cholesterol diet.  4. Complex medication regimen. The patient's medications were reviewed in      detail. The      patient's computerized medication calendar was adjusted accordingly and      given to patient. The patient is to return here as scheduled and sooner      if needed.      Rubye Oaks, NP  Electronically Signed      Charlaine Dalton. [name],  MD, [facility]  Electronically Signed   TP/MedQ  DD: 06/11/2006  DT: 06/11/2006  Job #: [id number]

## 2010-12-12 NOTE — Assessment & Plan Note (Signed)
[facility]                               PULMONARY OFFICE NOTE   NAME:Antonio Carlson, Antonio Carlson                       MRN:          [id number]  DATE:04/30/2006                            DOB:          [birth year]    HISTORY:  This is a very nice 65 year old white male, former smoker, with  triad asthma, maintained on prednisone at a dose of 20 mg tablets, one  quarter every other day by Dr. [name] for chronic eosinophilic rhinitis and  comes in today for followup evaluation of asthma but has now developed new  onset back pain.  About two weeks ago he had back pain for about a week that  seemed worse when he would lie down at night.  This went away but then came  back last night worse than ever.  The pain is in his left paralumbar  distribution but does not radiate and is not reproduced with twisting or  turning or rubbing on the back.  He denies any nausea or radiation of the  pain to the groin.   He is also in for followup of hyperlipidemia and is on Vytorin. Has been  having multiple aches and pains that he thinks is possibly the Vytorin, but  these things are generalized, somewhat migratory and nothing like the pain  he is having now.   PAST MEDICAL HISTORY:  Significant for kidney stones for which he has seen  Dr. Annabell Howells before.  He does not remember which side or if it felt like this  pain. He denies any dysuria or hematuria.   MEDICATIONS:  Please see face sheet dated April 30, 2006, and add Coreg  __________  to his present medications.   PHYSICAL EXAMINATION:  GENERAL:  He is a pleasant, ambulatory, white male in  no acute distress.  VITAL SIGNS:  Stable.  HEENT:  Unremarkable.  Oropharynx is clear.  CHEST:  Lung fields perfectly clear.  HEART:  Regular rhythm without murmur, gallop or rub.  ABDOMEN:  Soft, benign.  No palpable organomegaly, masses or tenderness.  EXTREMITIES:  Warm without calf tenderness, cyanosis or clubbing or edema.  Straight leg raising testing was negative.  There was no costovertebral or  ankle tenderness.   IMPRESSION:  1. Left back pain, relatively acute and probably does represent      nephrolithiasis.  A urinalysis is pending.  I have recommended plenty      of fluids and Vicodin one q.4 h with followup by Dr. Annabell Howells if it does      not resolve.  2. Hyperlipidemia.  He may be having adverse effects from Vytorin in terms      of muscle aches, but I do not believe it explains problem #1 because      the pain in his back is so different from his other aches and pains and      does not have reproducible muscle tenderness.  Nevertheless, I am going      to recommend we check a CPK, check a lipid profile and LFTs and stop  the Vytorin for two weeks to see whether or not the other aches and      pains resolve.  If so, alternative agents need to be considered.  I      have asked him to return in four weeks for full medication      reconciliation with you his clinical course in the meantime.  3. His asthma is the least of his problems. Now maintained on prednisone      20 mg tablets one-quarter every other day.  When he uses up his present      prescription, I would like him to switch to a 5 mg tablet for ease of      administration purposes and he will continue to use this every other      day with followup and titration of prednisone by Dr. Alita Chyle.   Follow up here for primary care purposes should be in January in the context  of the comprehensive health care evaluation.  We will see him sooner if  needed.            ______________________________  Charlaine Dalton Sherene Sires, MD, [facility]      MBW/MedQ  DD:  04/30/2006  DT:  05/02/2006  Job #:  [id number]   cc:   Lucky Cowboy, MD

## 2010-12-12 NOTE — Discharge Summary (Signed)
NAME:  Antonio Carlson, Antonio Carlson                ACCOUNT NO.:  [id number]   MEDICAL RECORD NO.:  [id number]          PATIENT TYPE:  INP   LOCATION:  6733                         FACILITY:  [facility]   PHYSICIAN:  Casimiro Needle B. Sherene Sires, M.D. [facility] OF BIRTH:  14-Nov-1945   DATE OF ADMISSION:  09/30/2005  DATE OF DISCHARGE:  10/01/2005                                 DISCHARGE SUMMARY   FINAL DIAGNOSES:  1.  Acute epigastric abdominal pain associated with elevated amylase, with      complete resolution and negative workup.      1.  CT scan on March 7 initially suggested gallbladder thickening but          normal pancreas and all other structures normal in the abdomen.      2.  Abdominal ultrasound on March 7 showed no evidence of gallbladder          disease at all.  2.  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  3.  Hyperlipidemia.   HISTORY:  Please see dictated H&P.  This patient was admitted through the  emergency room with a diagnosis of pancreatitis suggested by marked  elevation of amylase but was seen by GI, Dr. Yancey Flemings, with no clinical  evidence of pancreatitis and with complete resolution of all of his pain and  eating well within 24 hours of admission.  He continued to tolerate a normal  diet for 24 hours, and therefore he is being discharged in improved  condition with outpatient follow-up to be arranged.   His workup was as above with elevated lipase that was repeated on the  morning of March 8 and found to be normal along with amylase and all of his  LFTS.  Therefore, the cause of his abdominal pain remains unclear with the  possibility of either being GERD or irritable bowel syndrome, with biliary  colic and pancreatitis much less likely.   Outpatient follow-up will be arranged in a week on the following  medications:  1.  Advair 100/50 mcg one b.i.d.  2.  Multivitamin one daily.  3.  Singulair 10 mg one daily.  4.  Prednisone 5 mg each morning  as before.  5.  Vytorin 10/20 mg one tablet every evening.  6.  Omeprazole 20 mg b.i.d. (to be increased to twice daily empirically for      the next week).   His diet and activity are to be unrestricted.           ______________________________  Charlaine Dalton. Sherene Sires, M.D. [facility]     MBW/MEDQ  D:  10/01/2005  T:  10/01/2005  Job:  [id number]

## 2010-12-12 NOTE — Assessment & Plan Note (Signed)
[facility]                             PULMONARY OFFICE NOTE   NAME:Speckman, ANUJ SUMMONS                       MRN:          [id number]  DATE:10/20/2006                            DOB:          [birth year]    HISTORY:  This is a 65 year old white male who took himself off  prednisone several weeks ago (he was concerned about the effects on bone  density) and had only been on alternate day therapy anyway, but this was  at the direction of Dr. Alita Chyle for eosinophilic rhinitis, not for  his asthma.   He had previously had good control with Advair 100/50 b.i.d.  but comes  in today complaining of deep chest congestion with a cough productive  of clear sputum for the last several weeks, especially at bedtime with  increasing dyspnea and need for albuterol rescue therapy with any  activity.  He denies any purulent sputum, fevers, chills, sweats, chest  pain, leg swelling, but also has noted increasing sinus congestion  despite using dexamethasone nasal spray.   For full inventory of medications please see face sheet dated October 20, 2006.   PHYSICAL EXAMINATION:  He is a pleasant, ambulatory white male with  slight nasal tone to his voice.  Afebrile vital signs.  HEENT:  Reveals moderate turbinate edema.  Pharynx clear.  NECK:  Supple without cervical adenopathy or tenderness.  Trachea is  midline.  LUNGS:  Fields reveal a trace wheeze bilaterally at end expiration.  HEART:  Regular rate and rhythm without murmur, or rub.  ABDOMEN:  Soft. Benign.  EXTREMITIES:  Warm without calf tenderness, cyanosis, clubbing or edema.   IMPRESSION:  1. Break-through asthma symptoms associated with throat and nasal      congestion, previously controlled on prednisone.  One option would      be to simply increase the Advair, but note that he is already      having an irritation of the upper airway which may be related to      this or problem #2.  To sort through  this I recommended Symbicort 160/4.5 two puffs b.i.d.  and spend extra time making sure that he can inhale it effectively,  emphasizing that the goal of asthma therapy is elimination of symptoms  and minimal use for albuterol.  He is breaking both of these rules now  and I reviewed the rule of twos with him to emphasize that if he gets  great control with this, then we can reduce the dose of Symbicort on his  next visit which is due in May.  1. Poorly controlled rhinitis that is likely to exacerbate off of      prednisone.  I reviewed with him the use of Afrin for 5 days to      help dexamethasone penetrate the entire tissue.  2. Finally, in terms of the bone density issue, since Dr. Gerilyn Pilgrim is      responsible for the recommendation of systemic steroids, I      understand that she has recommended a bone  density.  I think this is appropriate.  However, since he is both a      pulmonary patient but also      my primary care patient, we will assume the responsibility here for      tracking and treating any abnormalities of bone demineralization.     Charlaine Dalton. Sherene Sires, MD, [facility]  Electronically Signed    MBW/MedQ  DD: 10/21/2006  DT: 10/21/2006  Job #: [id number]

## 2010-12-12 NOTE — Assessment & Plan Note (Signed)
[facility]                             PULMONARY OFFICE NOTE   NAME:Carlson, Antonio BREMER                       MRN:          [id number]  DATE:09/02/2006                            DOB:          02/22/1946    HISTORY:  A 65 year old white male with triad asthma and chronic  sinusitis with eosinophilic features which has required prednisone under  Dr. Larae Grooms direction on a chronic basis for the last several years.  The patient has been able to taper down to 2.5 mg every other day, but  Dr. Gerilyn Pilgrim was concerned about bone density issues and is having him  evaluated for this.  In the meantime, the patient has developed several  week history of worsening cough and congestion with yellow sputum  production and nasal congestion.  He is already using all of the  strategies that Dr. Gerilyn Pilgrim had recommended in terms of topical nasal  care.  He denies any sinus pain or dyspnea, fevers, chills, sweats,  orthopnea, PND or leg swelling.   PAST MEDICAL HISTORY:  1. 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  2. Hyperlipidemia, target LDL less than 130, last negative stress test      December 2000.  3. BPH followed by Dr. Bjorn Pippin.  4. Diverticulosis with failure to respond to a letter dated November      06 regarding need to schedule colonoscopy.  5. History of nephrolithiasis.  6. Status post remote appendectomy.   ALLERGIES:  ASPIRIN causes asthma.  SULFA and SUPRAX cause rash.  He can  tolerate other cephalosporins,  however, without any difficulty.   Medications taken detailed on work sheet, see column dated September 02, 2006 for details, which correlates with his medication calendar.   SOCIAL HISTORY:  He quit smoking in 1971.  He rarely drinks alcohol.  He  is not able to do any regular aerobic exercise.   FAMILY HISTORY:  Is positive for heart disease in his father at age 74,  who was a smoker.  No family history of any cancer of any kind,  hypertension or diabetes to his knowledge.   REVIEW OF SYSTEMS:  Taken in detail on the worksheet and essentially  negative except as outlined above, except for occasional nocturia.   PHYSICAL EXAMINATION:  This is a robust, pleasant ambulatory white male  in no acute distress.  He has stable vital signs.  HEENT:  Revealed moderate to severe turbinate edema with no obvious  pallor or polyps.  Oropharynx is clear.  Dentition intact.  NECK:  Is supple without cervical adenopathy or tenderness.  Carotids  upstrokes brisk without any bruits.  Trachea is midline.  No thyromegaly  Lung fields were completely clear bilaterally to auscultation and  percussion. There was excellent air movement.  There was regular rate and rhythm without murmur, gallop or rub present.  No displacement of PMI or increase in P2.  ABDOMEN:  Soft, no palpable organomegaly or masses, including aortic  enlargement.  Femoral pulses were present bilaterally.  GENITOURINARY & RECTAL:  Per Dr. Bjorn Pippin.  EXTREMITIES:  Warm without  calf tenderness, cyanosis, clubbing or edema.  NEUROLOGIC:  No focal deficits or pathologic reflexes.  SKIN:  Exam is warm and dry.  Pedal pulses were present bilaterally.   LAB DATA:  Chest x-ray was normal.  CBC revealed an eosinophil count of  9.2%, chemistry profile was normal, LFTs were normal, LDL cholesterol  was 98 with an HDL of 50, PSA was 3.5 (no comparison studies available),  urinalysis was normal.   IMPRESSION:  1. Triad asthma with chronic eosinophilic sinusitis, steroid      dependent.  I agree with Dr. Gerilyn Pilgrim in terms of screening him for      secondary complications of steroids now that he has been on it for      3 years and encourage the patient to continue at least for now      calcium and vitamin D daily in the same doses pending bone      evaluation.  2. Acute rhinitis/sinusitis with purulent sputum.  I recommended an      Omnipak with follow up with Dr. Gerilyn Pilgrim if  the purulent secretions do      not clear and further titration of prednisone per Dr. Gerilyn Pilgrim.  3. The asthmatic component of his problem is well controlled on Advair      and so the dose was not changed.  4. Hyperlipidemia.  LDL is at goal.  5. BPH, severe, with normal PSAs in the past, 3.5 on this evaluation      and the patient was encouraged to keep the appointment with Dr.      Annabell Howells.  6. General health maintenance.  He was updated on tetanus in 2007,      Pneumovax 2006.  He needs to keep the appointment for colonoscopy      this year and I will remind him via the phone tree that we agreed      he would do this on his last visit.  7. Symptomatic reflux controlled with omeprazole, which I have asked      him to continue.  However, this may be an issue if bisphosphonates      are needed for calcium homeostasis.  8. Follow up will be every 3 months, sooner if needed.   I spent extra time with this patient going each and every one of these  details in writing and in the form of a newly generated medication  calendar that provides a cleaned up and 100% accurate  version of his medicines in a format that is both user friendly and  unambiguous and I have asked him to show it to all of the doctors that  he sees.     Antonio Carlson. Antonio Sires, MD, [facility]  Electronically Signed    MBW/MedQ  DD: 09/03/2006  DT: 09/03/2006  Job #: [id number]   cc:   Excell Seltzer. Annabell Howells, M.D.  Lucky Cowboy, MD

## 2010-12-12 NOTE — Assessment & Plan Note (Signed)
[facility]ALTHC[facility]                             PULMONARY OFFICE NOTE   NAME:Antonio Carlson, Antonio Carlson                       MRN:          [id number]  DATE:12/01/2006                            DOB:          22-Mar-1946    HISTORY:  This is a 65 year old white male on chronic systemic steroids  per Dr. Lucky Cowboy for control of the eosinophilic rhinitis for several  years, from around November of 2004, through March 2008.  The patient  was concerned about osteoporosis and stopped the prednisone.  Dr. Gerilyn Pilgrim  has recommended a bone density followup and the patient returns today to  discuss the results of these studies.  He has never had any fracture nor  has he had any significant height loss.   In terms of his symptoms, he has no significant rhinitis or asthma  symptoms on his present complex regimen, but is reflected on the  medication calendar.  He is also here for followup of hyperlipidemia and  has no problems with TIA or claudication symptoms, exertional chest  pain, orthopnea, PND or leg swelling.   CURRENT MEDICATIONS:  For full list of medications, please see the  medication face sheet dated Dec 01, 2006.   PHYSICAL EXAMINATION:  GENERAL:  This is a pleasant ambulatory white  man, in no acute distress.  VITAL SIGNS:  Normal.  HEENT/NECK:  Moderate turbinate edema.  Oropharynx clear.  The neck is  supple without cervical adenopathy or tenderness.  Ear canals are clear  bilaterally.  LUNGS:  Fields are completely clear bilaterally to auscultation and  percussion.  A supplemental pseudo-wheeze.  HEART:  A regular rhythm without murmur, gallop or rub.  ABDOMEN:  Soft, benign.  EXTREMITIES:  Warm without calf tenderness, cyanosis, clubbing or edema.   Calcium level, vitamin D and testosterone level are pending today.  Bone  densitometry was reviewed from November 10, 2006, and it dictates a normal  AP spine density, T-score of 2.7, but osteoporotic range of bone  density  of the left neck femur and right neck femur.   IMPRESSION/RECOMMENDATIONS:  1. Osteopenia, not yet osteoporosis:  Obviously in this setting we      would like to avoid systemic steroids.  Because he has suspected      reflux, I would like to avoid biphosphonates as well and instead      recommend strongly more consistent aerobic weightbearing exercise,      along with optimal amounts of vitamin D and calcium.  (His vitamin      D level is pending.)  When he returns for follow-up medication      reconciliation, I plan to make sure that he is on optimal amounts      of calcium (1500 mg per day), and will give him vitamin D  if he is      deficient.  I also will check testosterone levels.  2. Hyperlipidemia:  I discussed the risks, benefits and alternatives      to Vytorin.  His target LDL is less than 130, and it may well be  with diet and exercise that he could be switched over to generic      Zocor by itself and save money at the same time use the one drug      that has shown reduction in stroke and heart attack, rather than      the drug that is controversial now because it may lower his      cholesterol and not necessarily atherosclerotic events.  Since he      already has Vytorin for now, I am going to leave him on it until he      returns for medication reduce risk reconciliation.  3. Asthma:  In terms of his asthma, he is doing great on the Symbicort      160/4.5 mcg, two puffs b.i.d. and I would think it would be optimal      to reduce him to 80/4.5 mcg, two puffs b.i.d. if he meets all the      goals of asthma therapy, which includes the elimination of symptoms      as well as the need for albuterol, a level of improvement which he      appears to have achieved at present, but since it has been so      recently that he stopped prednisone, I am reluctant to change the      Symbicort dose today.     Charlaine Dalton. Sherene Sires, MD, [facility]  Electronically Signed    MBW/MedQ   DD: 12/01/2006  DT: 12/01/2006  Job #: [id number]   cc:   Lucky Cowboy, MD

## 2010-12-12 NOTE — H&P (Signed)
NAME:  Antonio Carlson, Antonio Carlson                ACCOUNT NO.:  [id number]   MEDICAL RECORD NO.:  [id number]          PATIENT TYPE:  INP   LOCATION:  6733                         FACILITY:  [facility]   PHYSICIAN:  Casimiro Needle B. Sherene Sires, M.D. [facility] OF BIRTH:  1946-05-19   DATE OF ADMISSION:  09/30/2005  DATE OF DISCHARGE:                                HISTORY & PHYSICAL   CHIEF COMPLAINT:  Abdominal pain.   HISTORY:  This is a 65 year old white male remote smoker who has triad  asthma (aspirin sensitivity, polyps) as well as chronic eosinophilic  rhinitis for which he is under the treatment of Dr. Gerilyn Pilgrim with prednisone at  5 mg on a daily basis. He was in his usual state of health with no  significant prior abdominal complaints until approximately 11:30 p.m. on  September 29, 2005. The pain gradually worsened over about 30 minutes and  radiated to both flanks. He came to the emergency room where it is now  resolved. Workup indicated a thickened gallbladder wall and amylase and  lipase were elevated and therefore he is felt to need admission by the  emergency room physician for possible gallstone pancreatitis. The patient  states he is pain-free presently and has minimal abdominal bloating as  residual. He says he has no appetite, however. Prior to this he had had no  previous abdominal pain. He does have a history of alcohol use in the past  but none in the last few years. He has no history of any gallstones or fatty  food intolerance or previous abdominal. He had no associated nausea or fever  with this, nor any history of hypertriglyceridemia.   PAST MEDICAL HISTORY:  1.  Hyperlipidemia, with his most recent LDL cholesterol of 87 documented      August 22, 2005, but triglyceride level 111 at that time.  2.  BPH followed by Dr. Annabell Howells.  3.  Diverticulosis by flexible colonoscopy October 2002 and overdue for      follow-up colonoscopy by recall letter June 05, 2005.  4.  History of nephrolithiasis.  5.   Status post remote appendectomy.   ALLERGIES:  ASPIRIN causes asthma. SULFA and SUPRAX cause a rash. He can  take cephalosporins without any side effect, however.   MEDICATIONS:  1.  Singulair 10 mg one q.p.m.  2.  Dexamethasone nasal spray two puffs b.i.d.  3.  Zocor 40 mg q.h.s.  4.  Advair 100/50 one b.i.d.  5.  Prednisone 5 mg every day.  6.  Omeprazole 20 mg one q.a.m.  7.  Multivitamins one daily.  8.  Zetia 10 mg q.h.s.   SOCIAL HISTORY:  He quit smoking in 1971. He previously says he drank more  heavily but rarely drinks anything at all now in terms of alcohol. He stays  busy working on [employer]. He does not do any regular aerobic exercise.   FAMILY HISTORY:  Positive for heart disease in the father at age 19 (his  father also was a smoker with emphysema). There is no family history of any  cancer, hypertension, diabetes, or gallstone disease.  REVIEW OF SYSTEMS:  Taken in detail on the work sheet and essentially  negative except as outlined above.   PHYSICAL EXAMINATION:  GENERAL:  This is a pleasant white male in no acute  distress. He does not appear to be acutely ill, sitting back on a stretcher  at less than 45 degrees.  VITAL SIGNS:  He is afebrile with normal vital signs.  HEENT:  Unremarkable. Oropharynx is clear.  NECK:  Supple without cervical adenopathy or tenderness. The trachea is  midline. No thyromegaly.  LUNG FIELDS:  Reveal trace wheezing with FVC maneuver. There is regular  rhythm without murmur, gallop, or rub present.  ABDOMEN:  Soft, benign, with no palpable organomegaly, masses, or  tenderness. There was no rebound or guarding.  EXTREMITIES:  Warm without calf tenderness, cyanosis, or clubbing.   Laboratory data indicated a normal CBC. Lipase level of 289. Chemistry  profile was unremarkable. CT scan showing edema and thickening of the  gallbladder wall consistent with cholecystitis but no evidence of any duct  dilatation, but no evidence  of pancreatitis. There was a tiny nodule in the  left lower lobe with follow-up CT scan recommended. Diverticulosis and  prostatomegaly were also present.   IMPRESSION:  1.  Probable pancreatitis suggested by marked elevation of lipase and      location of pain. However, note the patient did not have any nausea and      now has complete resolution of pain. I am going to ask for GI      consultation and keep the patient n.p.o. for now, giving him maintenance      IV fluids.  2.  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      Will need to place him on stress doses of hydrocortisone and continue      him on bronchodilator maintenance, as well as continue Singulair daily.  3.  Hyperlipidemia with no history of any hypertriglyceridemia, therefore      not likely the cause of his present pancreatitis syndrome.  4.  Possible cholecystitis suggested by CT scan with no evidence of      cholelithiasis or family history to his knowledge.           ______________________________  Charlaine Dalton Sherene Sires, M.D. [facility]     MBW/MEDQ  D:  09/30/2005  T:  09/30/2005  Job:  [id number]

## 2010-12-26 ENCOUNTER — Other Ambulatory Visit: Payer: Self-pay | Admitting: *Deleted

## 2010-12-26 MED ORDER — MONTELUKAST SODIUM 10 MG PO TABS: 10.0000 mg | ORAL_TABLET | Freq: Every day | ORAL | Status: DC

## 2010-12-26 NOTE — Progress Notes (Signed)
Received refill request from [facility] for pt's singulair 10mg .  Pt last seen by TP 08/2010, last ov w/ MW 12/2009.  Pt is overdue for appt.  1 refill authorized with note to schedule appt with MW for future refills.

## 2011-01-09 ENCOUNTER — Other Ambulatory Visit: Payer: Self-pay | Admitting: *Deleted

## 2011-01-09 MED ORDER — METOPROLOL TARTRATE 50 MG PO TABS: ORAL_TABLET | ORAL | Status: DC

## 2011-02-20 ENCOUNTER — Other Ambulatory Visit: Payer: Self-pay | Admitting: Internal Medicine

## 2011-02-20 NOTE — Telephone Encounter (Signed)
Refill DENIED. Pt needs an appointment with MW for further refills.

## 2011-03-27 ENCOUNTER — Other Ambulatory Visit: Payer: Self-pay | Admitting: *Deleted

## 2011-03-27 NOTE — Telephone Encounter (Signed)
Refilled denied. Pt needs OV. Rx has been faxed back

## 2011-03-28 HISTORY — PX: NASAL SINUS SURGERY: SHX719

## 2011-04-13 ENCOUNTER — Other Ambulatory Visit: Payer: Self-pay | Admitting: *Deleted

## 2011-04-13 MED ORDER — METOPROLOL TARTRATE 50 MG PO TABS: ORAL_TABLET | ORAL | Status: DC

## 2011-04-23 ENCOUNTER — Other Ambulatory Visit: Payer: Self-pay | Admitting: *Deleted

## 2011-04-23 MED ORDER — SIMVASTATIN 80 MG PO TABS: 80.0000 mg | ORAL_TABLET | Freq: Every day | ORAL | Status: DC

## 2011-04-24 ENCOUNTER — Other Ambulatory Visit: Payer: Self-pay | Admitting: *Deleted

## 2011-04-24 MED ORDER — MONTELUKAST SODIUM 10 MG PO TABS: 10.0000 mg | ORAL_TABLET | Freq: Every day | ORAL | Status: DC

## 2011-04-27 LAB — DIFFERENTIAL
Basophils Absolute: 0
Basophils Relative: 1
Eosinophils Absolute: 0.3
Eosinophils Relative: 4
Lymphocytes Relative: 22
Lymphs Abs: 1.7
Monocytes Absolute: 0.7
Monocytes Relative: 9
Neutro Abs: 4.7
Neutrophils Relative %: 64

## 2011-04-27 LAB — APTT: aPTT: 30

## 2011-04-27 LAB — COMPREHENSIVE METABOLIC PANEL WITH GFR
ALT: 36
AST: 35
Albumin: 4.1
Alkaline Phosphatase: 94
CO2: 29
Chloride: 101
Creatinine, Ser: 0.74
GFR calc Af Amer: >60
Potassium: 4.3
Sodium: 137
Total Bilirubin: 0.5

## 2011-04-27 LAB — OCCULT BLOOD X 1 CARD TO LAB, STOOL: Fecal Occult Bld: NEGATIVE

## 2011-04-27 LAB — PROTIME-INR
INR: 0.9
Prothrombin Time: 12.3

## 2011-04-27 LAB — COMPREHENSIVE METABOLIC PANEL
BUN: 14
Calcium: 9.6
GFR calc non Af Amer: >60
Glucose, Bld: 107 — ABNORMAL HIGH
Total Protein: 7.2

## 2011-04-27 LAB — CBC
HCT: 49.5
Hemoglobin: 16.1
MCHC: 32.6
MCV: 87
Platelets: 281
RBC: 5.69
RDW: 14.3
WBC: 7.4

## 2011-04-27 LAB — POCT CARDIAC MARKERS
Myoglobin, poc: 82.8
Troponin i, poc: <0.05

## 2011-04-27 LAB — POCT I-STAT, CHEM 8
Calcium, Ion: 1.19
Chloride: 102
HCT: 50
Hemoglobin: 17

## 2011-05-11 ENCOUNTER — Other Ambulatory Visit: Payer: Self-pay | Admitting: *Deleted

## 2011-05-11 MED ORDER — OMEPRAZOLE 20 MG PO CPDR: 20.0000 mg | DELAYED_RELEASE_CAPSULE | Freq: Every day | ORAL | Status: DC

## 2011-05-18 ENCOUNTER — Encounter: Payer: Self-pay | Admitting: Internal Medicine

## 2011-05-18 ENCOUNTER — Other Ambulatory Visit (INDEPENDENT_AMBULATORY_CARE_PROVIDER_SITE_OTHER): Payer: Medicare Other

## 2011-05-18 ENCOUNTER — Ambulatory Visit (INDEPENDENT_AMBULATORY_CARE_PROVIDER_SITE_OTHER): Payer: Medicare Other | Admitting: Internal Medicine

## 2011-05-18 ENCOUNTER — Ambulatory Visit (INDEPENDENT_AMBULATORY_CARE_PROVIDER_SITE_OTHER)
Admission: RE | Admit: 2011-05-18 | Discharge: 2011-05-18 | Disposition: A | Discharge status: Home / Self Care | Payer: Medicare Other | Source: Ambulatory Visit | Attending: Internal Medicine | Admitting: Internal Medicine

## 2011-05-18 DIAGNOSIS — E785 Hyperlipidemia, unspecified: Secondary | ICD-10-CM

## 2011-05-18 DIAGNOSIS — J45909 Unspecified asthma, uncomplicated: Secondary | ICD-10-CM

## 2011-05-18 DIAGNOSIS — I10 Essential (primary) hypertension: Secondary | ICD-10-CM

## 2011-05-18 DIAGNOSIS — J309 Allergic rhinitis, unspecified: Secondary | ICD-10-CM

## 2011-05-18 LAB — URINALYSIS
Ketones, ur: NEGATIVE
Specific Gravity, Urine: 1.025 (ref 1.000–1.030)
Total Protein, Urine: NEGATIVE
Urine Glucose: NEGATIVE
pH: 5.5 (ref 5.0–8.0)

## 2011-05-18 LAB — HEPATIC FUNCTION PANEL
ALT: 23 U/L (ref 0–53)
AST: 20 U/L (ref 0–37)
Alkaline Phosphatase: 68 U/L (ref 39–117)
Bilirubin, Direct: 0.1 mg/dL (ref 0.0–0.3)
Total Bilirubin: 0.9 mg/dL (ref 0.3–1.2)

## 2011-05-18 LAB — LIPID PANEL
Cholesterol: 194 mg/dL (ref 0–200)
LDL Cholesterol: 113 mg/dL — ABNORMAL HIGH (ref 0–99)
Total CHOL/HDL Ratio: 3
VLDL: 22.2 mg/dL (ref 0.0–40.0)

## 2011-05-18 LAB — CBC WITH DIFFERENTIAL/PLATELET
Basophils Absolute: 0.1 10*3/uL (ref 0.0–0.1)
Basophils Relative: 0.5 % (ref 0.0–3.0)
Eosinophils Absolute: 0.1 10*3/uL (ref 0.0–0.7)
Hemoglobin: 15.6 g/dL (ref 13.0–17.0)
Lymphocytes Relative: 14.8 % (ref 12.0–46.0)
MCHC: 33.3 g/dL (ref 30.0–36.0)
Monocytes Relative: 4.6 % (ref 3.0–12.0)
Neutro Abs: 10 10*3/uL — ABNORMAL HIGH (ref 1.4–7.7)
Neutrophils Relative %: 79 % — ABNORMAL HIGH (ref 43.0–77.0)
RBC: 5.33 Mil/uL (ref 4.22–5.81)

## 2011-05-18 LAB — BASIC METABOLIC PANEL
CO2: 31 mEq/L (ref 19–32)
Calcium: 9.1 mg/dL (ref 8.4–10.5)
Chloride: 100 mEq/L (ref 96–112)
Glucose, Bld: 92 mg/dL (ref 70–99)
Potassium: 4.7 mEq/L (ref 3.5–5.1)
Sodium: 139 mEq/L (ref 135–145)

## 2011-05-18 LAB — TSH: TSH: 0.81 u[IU]/mL (ref 0.35–5.50)

## 2011-05-18 MED ORDER — LORAZEPAM 0.5 MG PO TABS: 0.5000 mg | ORAL_TABLET | Freq: Every day | ORAL | Status: DC | PRN

## 2011-05-18 NOTE — Patient Instructions (Addendum)
Let us know if you want [place] to refer you back to [facility] orthopedics for your R shoulder  Please remember to go to the lab and x-ray department downstairs for your tests - we will call you with the results when then are available.   Please schedule a follow up office visit in 6 weeks, call sooner if needed for CPX

## 2011-05-18 NOTE — Assessment & Plan Note (Signed)
Adequate control on present rx, reviewed  

## 2011-05-18 NOTE — Progress Notes (Signed)
Subjective:     Patient ID: Antonio Carlson, male   DOB: Jun 02, 1946, 65 y.o.   MRN: [id number]  HPI   40 yowm quit smoking in 1971   with history of Triad Asthma and hyperlipidemia   January 23, 2010 6 month followup. Pt fasting. Pt c/o nasal congestion and states "always have had this problem"- he is able to blow nose sometimes and mucus is yellow. No other complaints today. due f/u with ent > no change rx     05/18/2011 f/u ov/Miles Borkowski f/u multiple chronic issues  cc breathing ok,a little chest and nasal  congestion and transient fever due to a cold.had sinus surgery on 03-28-2011. No sob. Feels he's over the rest of it.   Sleeping ok without nocturnal  or early am exacerbation  of respiratory  c/o's or need for noct saba. Also denies any obvious fluctuation of symptoms with weather or environmental changes or other aggravating or alleviating factors except as outlined above   ROS  At present neg for  any significant sore throat, dysphagia, itching, sneezing,   excess/ purulent nasal secretions,    chills, sweats, unintended wt loss, pleuritic or exertional cp, hempoptysis, orthopnea pnd or leg swelling.  Also denies presyncope, palpitations, heartburn, abdominal pain, nausea, vomiting, diarrhea  or change in bowel or urinary habits, dysuria,hematuria,  rash, arthralgias, visual complaints, headache, numbness weakness or ataxia.     Allergies  1) ! Asa  2) Augmentin   Past Medical History:  PVCs.  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 [name] BENIGN PROSTATIC HYPERTROPHY, HX OF (ICD-V13.8)    DIVERTICULOSIS, MILD (ICD-562.10)................................[name] KitchenLina Sar  - flex sig 08/12/00  - letter sent 06/05/05, reminded May 17, 2008  NEPHROLITHIASIS (ICD-592.0)  HYPERLIPIDEMIA (ICD-272.4) target < 130 male, pos fm hx, h/l smoking  R Shoulder pain..................................................................... [facility] HEALTH MAINTENANCE.......................................................[name] KitchenWert  - CPX July 23, 2009  - Pneumovax 5/06 and @ age 79 June 11, 2009  - Td 07/2005    Past Surgical History:  Appendectomy  Colonoscopy    Family History:  heart disease in his father at age 59 he was a smoker  positive for coronary artery disease in his  father.  siblings healthy  mother still living without ascvd    Social History:  quit smoking 1971  rarely drink alcohol  Retired      Review of Systems     Objective:   Physical Exam    wt 198 May 17, 2008 > 197 July 23, 2009 > 189 June 30, 201 > 05/18/2011  183  Ambulatory healthy appearing wm in no acute distress but striking nasal tone to voice  HEENT: nl dentition, turbinates, and orophanx. Nl external ear canals without cough reflex  Neck without JVD/Nodes/TM  Lungs clear to A and P bilaterally without cough on insp or exp maneuvers  RRR no s3 or murmur or increase in P2  Abd soft and benign with nl excursion in the supine position. No bruits or organomegaly  Ext warm without calf tenderness, cyanosis clubbing or edema  Skin:  No lesions MS Pain at greater than 75 degrees flex R arm   CXR  05/18/2011 :  No evidence of acute cardiopulmonary disease.   Assessment:         Plan:

## 2011-05-18 NOTE — Assessment & Plan Note (Signed)
Adequate control on present rx, reviewed - despite recent apparent uri continues to maintain control    Each maintenance medication was reviewed in detail including most importantly the difference between maintenance and as needed and under what circumstances the prns are to be used.  Please see instructions for details which were reviewed in writing and the patient given a copy.

## 2011-05-18 NOTE — Assessment & Plan Note (Signed)
Not Adequate control on present rx, reviewed options, work on diet and ex

## 2011-05-19 ENCOUNTER — Telehealth: Payer: Self-pay | Admitting: Internal Medicine

## 2011-05-19 NOTE — Telephone Encounter (Signed)
Call patient : Studies are unremarkable, no change in recs  Call pt: Reviewed cxr and no acute change so no change in recommendations made at [facility]   [facility]

## 2011-05-19 NOTE — Telephone Encounter (Signed)
Mr. Swayze returning triage's call & [phone].  Antionette Fairy

## 2011-05-19 NOTE — Telephone Encounter (Signed)
Pt is aware of results per Dr. Sherene Sires.

## 2011-05-29 ENCOUNTER — Other Ambulatory Visit: Payer: Self-pay | Admitting: Internal Medicine

## 2011-05-29 ENCOUNTER — Telehealth: Payer: Self-pay | Admitting: Internal Medicine

## 2011-05-29 MED ORDER — MONTELUKAST SODIUM 10 MG PO TABS: 10.0000 mg | ORAL_TABLET | Freq: Every day | ORAL | Status: DC

## 2011-05-29 NOTE — Telephone Encounter (Signed)
Refill on Singulair sent. Pt aware. Carron Curie, CMA

## 2011-06-08 ENCOUNTER — Other Ambulatory Visit: Payer: Self-pay | Admitting: *Deleted

## 2011-06-08 MED ORDER — OMEPRAZOLE 20 MG PO CPDR: 20.0000 mg | DELAYED_RELEASE_CAPSULE | Freq: Every day | ORAL | Status: DC

## 2011-06-23 ENCOUNTER — Ambulatory Visit: Payer: Medicare Other | Admitting: Internal Medicine

## 2011-07-09 ENCOUNTER — Other Ambulatory Visit: Payer: Self-pay | Admitting: Internal Medicine

## 2011-07-09 MED ORDER — METOPROLOL TARTRATE 50 MG PO TABS: ORAL_TABLET | ORAL | Status: DC

## 2011-07-09 MED ORDER — OMEPRAZOLE 20 MG PO CPDR: 20.0000 mg | DELAYED_RELEASE_CAPSULE | Freq: Every day | ORAL | Status: DC

## 2011-07-09 NOTE — Telephone Encounter (Signed)
Received faxed refill request for pt's omeprazole 20mg  and metoprolol 50mg .  Refill approved and faxed back to pharmacy by Jerolyn Shin, CMA.  Med list updated.

## 2011-07-17 ENCOUNTER — Encounter: Payer: Self-pay | Admitting: Internal Medicine

## 2011-07-17 ENCOUNTER — Ambulatory Visit (INDEPENDENT_AMBULATORY_CARE_PROVIDER_SITE_OTHER): Payer: Medicare Other | Admitting: Internal Medicine

## 2011-07-17 VITALS — BP 140/86 | HR 61 | Temp 97.7°F | Ht 68.75 in | Wt 192.4 lb

## 2011-07-17 DIAGNOSIS — I10 Essential (primary) hypertension: Secondary | ICD-10-CM

## 2011-07-17 DIAGNOSIS — Z23 Encounter for immunization: Secondary | ICD-10-CM

## 2011-07-17 DIAGNOSIS — E785 Hyperlipidemia, unspecified: Secondary | ICD-10-CM

## 2011-07-17 DIAGNOSIS — J309 Allergic rhinitis, unspecified: Secondary | ICD-10-CM

## 2011-07-17 DIAGNOSIS — J45909 Unspecified asthma, uncomplicated: Secondary | ICD-10-CM

## 2011-07-17 NOTE — Patient Instructions (Signed)

## 2011-07-17 NOTE — Assessment & Plan Note (Signed)
Adequate control on present rx, reviewed  

## 2011-07-17 NOTE — Progress Notes (Addendum)
Subjective:    Patient ID: Antonio Carlson, male    DOB: [birth year], 65 y.o.   MRN: [id number]  HPI 68 yowm quit smoking in 1971 with history of Triad Asthma and hyperlipidemia    05/18/2011 f/u ov/[name] f/u multiple chronic issues cc breathing ok,a little chest and nasal congestion and transient fever due to a cold.had sinus surgery on 03-28-2011. No sob. Feels he's over the rest of it.  rec No change rx    07/17/11 CPX/ [name] main complaint is watery pnds, no purulent sputum.  Sleeping ok without nocturnal or early am exacerbation of respiratory c/o's or need for noct saba. Also denies any obvious fluctuation of symptoms with weather or environmental changes or other aggravating or alleviating factors except as outlined above   ROS At present neg for any significant sore throat, dysphagia, itching, sneezing, excess/ purulent nasal secretions, chills, sweats, unintended wt loss, pleuritic or exertional cp, hempoptysis, orthopnea pnd or leg swelling. Also denies presyncope, palpitations, heartburn, abdominal pain, nausea, vomiting, diarrhea or change in bowel or urinary habits, dysuria,hematuria, rash, arthralgias, visual complaints, headache, numbness weakness or ataxia.   Allergies  1) ! Asa  2) Augmentin   Past Medical History:  PVCs.  Syncope  - Holter ordered June 27, 2008  - EP consult June 27, 2008  OSTEOPENIA (ICD-733.90)  COLONIC POLYPS (ICD-211.3) .......................................[name] Kitchen [name] DIVERTICULOSIS, MILD (ICD-562.10) - flex sig 08/12/00  - letter sent 06/05/05, reminded May 17, 2008  ASTHMA (ICD-493.90)  - HFA 90% June 11, 2009  ALLERGIC RHINITIS, CHRONIC (ICD-477.9)........................[name]  - steroid dep until mid oct 2009  - Allergy profile sent January 23, 2010 >> IgE 18.3  - Polypectomy Sept 2012 ...................................................... [name]  BENIGN PROSTATIC HYPERTROPHY, HX OF (ICD-V13.8) .[name] Kitchen... [name] NEPHROLITHIASIS  (ICD-592.0)  HYPERLIPIDEMIA (ICD-272.4) target < 130 male, pos fm hx, h/l smoking  R Shoulder pain..................................................................... [facility]  HEALTH MAINTENANCE.......................................................[name] KitchenWert  - CPX 07/17/2011  - Pneumovax 5/06 and @ age 29 June 11, 2009  - Td 07/2005   Past Surgical History:  Appendectomy  Colonoscopy   Family History:  heart disease in his father at age 45 he was a smoker  Ca brain half brother siblings healthy but their half mother still living without ascvd  But dementia onset late 84s  Social History:  quit smoking 1971  rarely drink alcohol  Retired     Review of Systems  Constitutional: Negative for fever, chills, diaphoresis, activity change, appetite change, fatigue and unexpected weight change.  HENT: Positive for postnasal drip. Negative for hearing loss, ear pain, nosebleeds, congestion, sore throat, facial swelling, rhinorrhea, sneezing, mouth sores, trouble swallowing, neck pain, neck stiffness, dental problem, voice change, sinus pressure, tinnitus and ear discharge.   Eyes: Negative for photophobia, discharge, itching and visual disturbance.  Respiratory: Negative for apnea, cough, choking, chest tightness, shortness of breath, wheezing and stridor.   Cardiovascular: Negative for chest pain, palpitations and leg swelling.  Gastrointestinal: Negative for nausea, vomiting, abdominal pain, constipation, blood in stool and abdominal distention.  Genitourinary: Negative for dysuria, urgency, frequency, hematuria, flank pain, decreased urine volume and difficulty urinating.  Musculoskeletal: Negative for myalgias, back pain, joint swelling, arthralgias and gait problem.  Skin: Negative for color change, pallor and rash.  Neurological: Negative for dizziness, tremors, seizures, syncope, speech difficulty, weakness, light-headedness, numbness and headaches.  Hematological:  Negative for adenopathy. Does not bruise/bleed easily.  Psychiatric/Behavioral: Negative for confusion, sleep disturbance and agitation. The patient is not nervous/anxious.  Objective:   Physical Exam  wt 198 May 17, 2008 >  > 189 January 23, 2010 > 192 07/17/11 Ambulatory healthy appearing wm in no acute distress but striking nasal tone to voice  HEENT: nl dentition, turbinates, and orophanx. Nl external ear canals without cough reflex  Neck without JVD/Nodes/TM  Lungs clear to A and P bilaterally without cough on insp or exp maneuvers  RRR no s3 or murmur or increase in P2  Abd soft and benign with nl excursion in the supine position. No bruits or organomegaly  Ext warm without calf tenderness, cyanosis clubbing or edema  Skin warm and dry without lesions  GU testes down bilateral, mod smooth bph, stool G neg MS no deformities/ restrictions Neuro: sensorium intact, no deficits       Assessment & Plan:

## 2011-07-19 NOTE — Assessment & Plan Note (Signed)
Adequate control on present rx, reviewed  

## 2011-07-19 NOTE — Assessment & Plan Note (Signed)
   Each maintenance medication was reviewed in detail including most importantly the difference between maintenance and as needed and under what circumstances the prns are to be used. This was done in the context of a medication calendar review which provided the patient with a user-friendly unambiguous mechanism for medication administration and reconciliation and provides an action plan for all active problems. It is critical that this be shown to every doctor  for modification during the office visit if necessary so the patient can use it as a working document.     

## 2011-07-31 ENCOUNTER — Other Ambulatory Visit: Payer: Self-pay | Admitting: Internal Medicine

## 2011-08-07 ENCOUNTER — Telehealth: Payer: Self-pay | Admitting: Internal Medicine

## 2011-08-07 DIAGNOSIS — E785 Hyperlipidemia, unspecified: Secondary | ICD-10-CM

## 2011-08-07 MED ORDER — ROSUVASTATIN CALCIUM 20 MG PO TABS: 20.0000 mg | ORAL_TABLET | Freq: Every day | ORAL | Status: DC

## 2011-08-07 NOTE — Telephone Encounter (Signed)
Received notice from [facility] stating that simvastatin is no longer covered. Per MW, change to crestor 20 mg- this is covered under his plan. I called and spoke with Spouse, Liborio Nixon and notified of this and she states okay to send in new rx to pharmacy. Rx was sent and pt to come back in 6 wks for fasting lipid panel. Order already placed for this.

## 2011-08-10 ENCOUNTER — Telehealth: Payer: Self-pay | Admitting: Internal Medicine

## 2011-08-10 DIAGNOSIS — E785 Hyperlipidemia, unspecified: Secondary | ICD-10-CM

## 2011-08-10 NOTE — Telephone Encounter (Signed)
LMTCB-need to know if patient wants to have 40mg  QD instead of 80mg  QD or if insurance will pay for 40mg  2 QD.

## 2011-08-11 MED ORDER — SIMVASTATIN 40 MG PO TABS: 40.0000 mg | ORAL_TABLET | Freq: Every evening | ORAL | Status: DC

## 2011-08-11 NOTE — Telephone Encounter (Signed)
Fine with me to stay on the 40 mg per day but needs fasting lipid profile at 3 months after change and bring his formulary with him for that visit to see what we can do to work out an effective affordable strategy

## 2011-08-11 NOTE — Telephone Encounter (Signed)
Pt says he cannot afford and insurance will not pay for simvastatin 80mg . He wants to know if this can be changed to simvastatin 40mg  instead. He also says he cannot afford the crestor that was prescribed. Pls advise. Allergies  Allergen Reactions  . Aspirin     REACTION: sob  . Amoxicillin     REACTION: gi upset  . XBJ:[id number]+QMVHQIONG+EXBMWUXLKG Acid+Aspartame     REACTION: gi upset  . Sulfonamide Derivatives

## 2011-08-11 NOTE — Telephone Encounter (Signed)
Spoke with pt and notified of recs per MW. Pt verbalized understanding and rx was sent to pharm. Pt states will call back to schedule appt with fasting labs for 3 months.

## 2011-08-13 ENCOUNTER — Other Ambulatory Visit: Payer: Self-pay | Admitting: Allergy

## 2011-08-13 MED ORDER — BUDESONIDE-FORMOTEROL FUMARATE 160-4.5 MCG/ACT IN AERO: 2.0000 | INHALATION_SPRAY | Freq: Two times a day (BID) | RESPIRATORY_TRACT | Status: DC

## 2011-08-13 NOTE — Telephone Encounter (Signed)
rx for symbicort  160/ sent to the [facility]  [address]

## 2011-08-31 ENCOUNTER — Other Ambulatory Visit: Payer: Self-pay | Admitting: *Deleted

## 2011-08-31 MED ORDER — SIMVASTATIN 40 MG PO TABS: 40.0000 mg | ORAL_TABLET | Freq: Every evening | ORAL | Status: DC

## 2011-09-15 DIAGNOSIS — J321 Chronic frontal sinusitis: Secondary | ICD-10-CM | POA: Diagnosis not present

## 2011-09-15 DIAGNOSIS — J330 Polyp of nasal cavity: Secondary | ICD-10-CM | POA: Diagnosis not present

## 2011-09-15 DIAGNOSIS — J322 Chronic ethmoidal sinusitis: Secondary | ICD-10-CM | POA: Diagnosis not present

## 2011-09-15 DIAGNOSIS — J320 Chronic maxillary sinusitis: Secondary | ICD-10-CM | POA: Diagnosis not present

## 2011-10-01 ENCOUNTER — Ambulatory Visit (INDEPENDENT_AMBULATORY_CARE_PROVIDER_SITE_OTHER)
Admission: RE | Admit: 2011-10-01 | Discharge: 2011-10-01 | Disposition: A | Discharge status: Home / Self Care | Payer: Medicare Other | Source: Ambulatory Visit | Attending: Internal Medicine | Admitting: Internal Medicine

## 2011-10-01 ENCOUNTER — Encounter: Payer: Self-pay | Admitting: Internal Medicine

## 2011-10-01 ENCOUNTER — Ambulatory Visit (INDEPENDENT_AMBULATORY_CARE_PROVIDER_SITE_OTHER): Payer: Medicare Other | Admitting: Internal Medicine

## 2011-10-01 VITALS — BP 138/86 | HR 96 | Temp 98.2°F | Ht 68.0 in | Wt 190.6 lb

## 2011-10-01 DIAGNOSIS — R509 Fever, unspecified: Secondary | ICD-10-CM | POA: Diagnosis not present

## 2011-10-01 DIAGNOSIS — R05 Cough: Secondary | ICD-10-CM | POA: Diagnosis not present

## 2011-10-01 DIAGNOSIS — J069 Acute upper respiratory infection, unspecified: Secondary | ICD-10-CM | POA: Diagnosis not present

## 2011-10-01 DIAGNOSIS — E785 Hyperlipidemia, unspecified: Secondary | ICD-10-CM | POA: Diagnosis not present

## 2011-10-01 DIAGNOSIS — J45909 Unspecified asthma, uncomplicated: Secondary | ICD-10-CM

## 2011-10-01 DIAGNOSIS — I10 Essential (primary) hypertension: Secondary | ICD-10-CM | POA: Diagnosis not present

## 2011-10-01 NOTE — Patient Instructions (Signed)
Please remember to go to the lab and x-ray department downstairs for your tests - we will call you with the results when they are available.  Stop the antibiotics for now    [facility] of fluids and soups  Please schedule a follow up office visit in 4 weeks, sooner if needed

## 2011-10-01 NOTE — Progress Notes (Signed)
  Subjective:    Patient ID: Antonio Carlson, male    DOB: 27-Nov-1945   MRN: [id number]  Brief patient profile:  66 yowm quit smoking in 1971 with history of Triad Asthma and hyperlipidemia   HPI   10/01/2011 f/u ov/Dollie Bressi cc Pt c/o chills on and off x 5 days, had night sweats last night. He also c/o weakness in legs and overall feels achy. He has prod cough with minimal clear sputum. Aches all over. On minocin until 24 h pt ov x weeks. Exposed to dead rabbits a week or so ago   Sleeping ok without nocturnal or early am exacerbation of respiratory c/o's or need for noct saba. Also denies any obvious fluctuation of symptoms with weather or environmental changes or other aggravating or alleviating factors except as outlined above   ROS At present neg for any significant sore throat, dysphagia, itching, sneezing, excess/ purulent nasal secretions, chills, sweats, unintended wt loss, pleuritic or exertional cp, hempoptysis, orthopnea pnd or leg swelling. Also denies presyncope, palpitations, heartburn, abdominal pain, nausea, vomiting, diarrhea or change in bowel or urinary habits, dysuria,hematuria, rash, arthralgias, visual complaints, headache, numbness weakness or ataxia.   Allergies  1) ! Asa  2) Augmentin   Past Medical History:  PVCs.  Syncope  - Holter ordered June 27, 2008  - EP consult June 27, 2008  OSTEOPENIA (ICD-733.90)  COLONIC POLYPS (ICD-211.3) .......................................[name] Kitchen [name] DIVERTICULOSIS, MILD (ICD-562.10) - flex sig 08/12/00  - letter sent 06/05/05, reminded May 17, 2008  ASTHMA (ICD-493.90)  - HFA 90% June 11, 2009  ALLERGIC RHINITIS, CHRONIC (ICD-477.9)........................[name]  - steroid dep until mid oct 2009  - Allergy profile sent January 23, 2010 >> IgE 18.3  - Polypectomy Sept 2012 ...................................................... [name]  BENIGN PROSTATIC HYPERTROPHY, HX OF (ICD-V13.8) .[name] Kitchen... [name] NEPHROLITHIASIS  (ICD-592.0)  HYPERLIPIDEMIA (ICD-272.4) target < 130 male, pos fm hx, h/l smoking  R Shoulder pain..................................................................... [facility]  HEALTH MAINTENANCE.......................................................[name] KitchenWert  - CPX 07/17/2011  - Pneumovax 5/06 and @ age 26 June 11, 2009  - Td 07/2005   Past Surgical History:  Appendectomy  Colonoscopy   Family History:  heart disease in his father at age 58 he was a smoker  Ca brain half brother siblings healthy but their half mother still living without ascvd  But dementia onset late 75s  Social History:  quit smoking 1971  rarely drink alcohol  Retired          Objective:   Physical Exam  wt 198 May 17, 2008 >  > 189 January 23, 2010 > 192 07/17/11 > 190 10/01/2011  Ambulatory healthy appearing wm in no acute distress but striking nasal tone to voice  HEENT: nl dentition, turbinates, and orophanx. Nl external ear canals without cough reflex  Neck without JVD/Nodes/TM  Lungs clear to A and P bilaterally without cough on insp or exp maneuvers  RRR no s3 or murmur or increase in P2  Abd soft and benign with nl excursion in the supine position. No bruits or organomegaly  Ext warm without calf tenderness, cyanosis clubbing or edema  Skin warm and dry without lesions       CXR  10/01/2011 :  No evidence of acute cardiopulmonary disease.         Assessment & Plan:

## 2011-10-02 ENCOUNTER — Telehealth: Payer: Self-pay | Admitting: Internal Medicine

## 2011-10-02 ENCOUNTER — Emergency Department (HOSPITAL_COMMUNITY)
Admission: EM | Admit: 2011-10-02 | Discharge: 2011-10-02 | Disposition: A | Discharge status: Home Health | Payer: Medicare Other | Attending: Emergency Medicine | Admitting: Emergency Medicine

## 2011-10-02 ENCOUNTER — Encounter (HOSPITAL_COMMUNITY): Payer: Self-pay | Admitting: Emergency Medicine

## 2011-10-02 DIAGNOSIS — R509 Fever, unspecified: Secondary | ICD-10-CM | POA: Diagnosis not present

## 2011-10-02 DIAGNOSIS — E785 Hyperlipidemia, unspecified: Secondary | ICD-10-CM | POA: Diagnosis not present

## 2011-10-02 DIAGNOSIS — L509 Urticaria, unspecified: Secondary | ICD-10-CM | POA: Insufficient documentation

## 2011-10-02 DIAGNOSIS — R059 Cough, unspecified: Secondary | ICD-10-CM | POA: Diagnosis not present

## 2011-10-02 DIAGNOSIS — K219 Gastro-esophageal reflux disease without esophagitis: Secondary | ICD-10-CM | POA: Diagnosis not present

## 2011-10-02 DIAGNOSIS — Z79899 Other long term (current) drug therapy: Secondary | ICD-10-CM | POA: Insufficient documentation

## 2011-10-02 DIAGNOSIS — R05 Cough: Secondary | ICD-10-CM | POA: Insufficient documentation

## 2011-10-02 DIAGNOSIS — I10 Essential (primary) hypertension: Secondary | ICD-10-CM | POA: Insufficient documentation

## 2011-10-02 DIAGNOSIS — J45909 Unspecified asthma, uncomplicated: Secondary | ICD-10-CM | POA: Insufficient documentation

## 2011-10-02 MED ORDER — PREDNISONE 10 MG PO TABS: 20.0000 mg | ORAL_TABLET | Freq: Every day | ORAL | Status: DC

## 2011-10-02 MED ORDER — DIPHENHYDRAMINE HCL 25 MG PO CAPS
50.0000 mg | ORAL_CAPSULE | Freq: Once | ORAL | Status: AC
  Administered 2011-10-02: 50 mg via ORAL
  Filled 2011-10-02: qty 2

## 2011-10-02 MED ORDER — PREDNISONE 20 MG PO TABS
60.0000 mg | ORAL_TABLET | Freq: Once | ORAL | Status: AC
  Administered 2011-10-02: 60 mg via ORAL
  Filled 2011-10-02: qty 3

## 2011-10-02 MED ORDER — DIPHENHYDRAMINE HCL 25 MG PO TABS: 50.0000 mg | ORAL_TABLET | Freq: Four times a day (QID) | ORAL | Status: DC

## 2011-10-02 NOTE — Telephone Encounter (Signed)
Called, spoke with pt.  He was seen by MW yesterday.  States when he got home last night, he broke out in a rash on arms and legs.  Later last night, rash spread to shoulders, neck, and back.  States rash looks like wheps and itches but is not painful.  Also, c/o joint stiffness.  Denies fever.  Reports he has been hunting rabbits x 1 month and has handled dead rabbits - worried this may be "rabbit fever."  Would like to know if he needs to come in for blood test.  Dr. Sherene Sires, pls advise.  Also, pt requesting CXR results from yesterday:   Notes Recorded by Sandrea Hughs, MD on 10/01/2011 at 2:12 PM Call pt: Reviewed cxr and no acute change so no change in recommendations made at ov  I informed pt, per MW, no acute change so no change in recs from OV on yesterday.  He verbalized understanding of these results and recs.

## 2011-10-02 NOTE — Discharge Instructions (Signed)
Return to the ED with any concerns including fever or chills, vomiting and not able to keep down liquids or medications, swelling of her lips or tongue, difficulty breathing, fainting, decreased level of alertness or lethargy, or any other alarming symptoms.

## 2011-10-02 NOTE — Telephone Encounter (Signed)
We don't do any tests like that here but would rec go to  and be evaluated there - they have ID on call to see what additional tests might be needed

## 2011-10-02 NOTE — Telephone Encounter (Signed)
Discussed directly with pt - he was already on minocyclin when he became ill and septra is really not a good choice for a pt for empiric therapy when they already have a rash so rec er eval and ID to see prn

## 2011-10-02 NOTE — ED Notes (Signed)
Pt here for rash/hives starting today; pt sts itching; pt sts just finished antibiotics for flu like sx 2 days ago; pt denies any changes or new meds other than antibiotic

## 2011-10-02 NOTE — Progress Notes (Signed)
Quick Note:  Spoke with pt. I informed him cxr showed no acute changed per MW, so MW recs no change in recs from OV. He verbalized understanding of these results and recs. ______

## 2011-10-02 NOTE — Telephone Encounter (Signed)
Called, spoke with pt.  I informed him of MW's recs.  He verbalized understanding of this and voiced no further questions/concerns at this time.

## 2011-10-02 NOTE — Telephone Encounter (Signed)
Called and spoke with pt and he stated that the hospital told him they didn't know what that test was.  Pt stated that [facility] can do the test but it will take 10 days to get the results back.  The treatment for "rabbit fever" is septra or tetracycline and pt is wanting to know if MW will call one of these in for the pt since he was seen yesterday.  MW  Please advise. Thanks  Allergies  Allergen Reactions  . Aspirin     REACTION: sob  . Amoxicillin     REACTION: gi upset  . WUJ:[id number]+AOZHYQMVH+QIONGEXBMW Acid+Aspartame     REACTION: gi upset  . Sulfonamide Derivatives

## 2011-10-02 NOTE — ED Provider Notes (Signed)
History     CSN: [id number]  Arrival date & time 10/02/11  1501   First MD Initiated Contact with Patient 10/02/11 1630      Chief Complaint  Patient presents with  . Rash  . Urticaria    (Consider location/radiation/quality/duration/timing/severity/associated sxs/prior treatment) HPI Patient presents with complaint of itchy rash and hives which began last night. The rash is overlying his back upper chest and lower back. He's had no lip or tongue swelling and no shortness of breath. Patient recently finished a course of minocycline for a likely sinusitis. He was also seen by his doctor yesterday for flulike symptoms however he has no fever. A chest x-ray performed yesterday was normal. He's had no new medications with the exception of the recent antibiotics. He denies any new foods detergents soaps or other new exposures. Rash is very itchy. He has not had any treatment prior to arrival. The symptoms are continuous. There no other associated systemic symptoms. There are no alleviating or modifying factors.  Past Medical History  Diagnosis Date  . PVC's (premature ventricular contractions)   . Syncope   . Asthma   . Allergy   . Benign prostatic hypertrophy   . Diverticulosis   . Nephrolithiasis   . Hyperlipidemia   . Hypertension   . GERD (gastroesophageal reflux disease)     Past Surgical History  Procedure Date  . Appendectomy   . Nasal sinus surgery Sept 2012    History reviewed. No pertinent family history.  History  Substance Use Topics  . Smoking status: Former Smoker -- 2.0 packs/day for 14 years    Quit date: 07/27/1969  . Smokeless tobacco: Never Used  . Alcohol Use: No      Review of Systems ROS reviewed and otherwise negative except for mentioned in HPI  Allergies  Aspirin; Amoxicillin; ONG:EXBMWUXLKGM+WNUUVOZDG+UYQIHKVQQV acid+aspartame; and Sulfonamide derivatives  Home Medications   Current Outpatient Rx  Name Route Sig Dispense Refill  .  ACETAMINOPHEN 500 MG PO TABS Oral Take 500 mg by mouth as needed.      . ACYCLOVIR 5 % EX OINT Topical Apply topically 4 (four) times daily as needed.      . ALBUTEROL 90 MCG/ACT IN AERS Inhalation Inhale 2 puffs into the lungs every 4 (four) hours as needed. For shortness of breath    . BUDESONIDE-FORMOTEROL FUMARATE 160-4.5 MCG/ACT IN AERO Inhalation Inhale 2 puffs into the lungs every 12 (twelve) hours. 1 Inhaler 12  . CALCIUM 500 PO Oral Take by mouth daily.      . CHLORPHENIRAMINE MALEATE 4 MG PO TABS Oral Take 4 mg by mouth as needed.      [name] Kitchen FLUTICASONE PROPIONATE 50 MCG/ACT NA SUSP Nasal Place 1 spray into the nose daily as needed.     [name] Kitchen LORAZEPAM 0.5 MG PO TABS Oral Take 1 tablet (0.5 mg total) by mouth daily as needed. 30 tablet 0  . METOPROLOL TARTRATE 50 MG PO TABS  One half tab twice daily 30 tablet 5  . MONTELUKAST SODIUM 10 MG PO TABS Oral Take 1 tablet (10 mg total) by mouth daily. 30 tablet 6  . CENTRUM PO TABS Oral Take 1 tablet by mouth daily.      [name] Kitchen MUPIROCIN 2 % EX OINT Topical Apply 1 application topically daily as needed.     [name] Kitchen OMEPRAZOLE 20 MG PO CPDR Oral Take 1 capsule (20 mg total) by mouth daily. 30 capsule 5  . OXYMETAZOLINE HCL 0.05 % NA SOLN Nasal  2 sprays by Nasal route as needed.      [name] Kitchen SIMVASTATIN 40 MG PO TABS Oral Take 1 tablet (40 mg total) by mouth every evening. 30 tablet 5  . DIPHENHYDRAMINE HCL 25 MG PO TABS Oral Take 2 tablets (50 mg total) by mouth every 6 (six) hours. Take 1-2 tablets every 6 hours x 2 days, then space out to an as needed basis 20 tablet 0  . PREDNISONE 10 MG PO TABS Oral Take 2 tablets (20 mg total) by mouth daily. 10 tablet 0    BP 136/79  Pulse 91  Temp(Src) 97.6 F (36.4 C) (Oral)  Resp 18  SpO2 95% Vitals reviewed Physical Exam Physical Examination: General appearance - alert, well appearing, and in no distress Mental status - alert, oriented to person, place, and time Eyes - pupils equal and reactive, no conjunctival  injection, no scleral icterus Mouth - mucous membranes moist, pharynx normal without lesions Chest - clear to auscultation, no wheezes, rales or rhonchi, symmetric air entry Heart - normal rate, regular rhythm, normal S1, S2, no murmurs, rubs, clicks or gallops Abdomen - soft, nontender, nondistended, no masses or organomegaly Musculoskeletal - no joint tenderness, deformity or swelling Extremities - peripheral pulses normal, no pedal edema, no clubbing or cyanosis Skin - normal coloration and turgor, hives scattered over upper/lower back, upper chest, no petechiae  ED Course  Procedures (including critical care time)  Labs Reviewed - No data to display Dg Chest 2 View  10/01/2011  *RADIOLOGY REPORT*  Clinical Data: Cough, fever  CHEST - 2 VIEW  Comparison: 05/18/2011  Findings: Lungs are essentially clear.  No pleural effusion or pneumothorax.  The heart is normal in size.  Stable bilateral hilar prominence.  Mild degenerative changes of the visualized thoracolumbar spine.  IMPRESSION: No evidence of acute cardiopulmonary disease.  Original Report Authenticated By: Charline Bills, M.D.     1. Hives       MDM  Patient presenting with a chief rash and hives over his upper back and chest which began last night. He has also recently been treated with minocycline and for sinusitis. He saw his doctor yesterday and do to flulike symptoms and had a normal chest x-ray. He's had no fever.  Pt has some concern after reading on the internet about tularemia- he has handled rabbits recently.  However, in the absence of fever, and the pt looking well I think his hives are more likely due to some type of allergic reaction to an unknown substance.  I have had a long discussion with patient and his wife about the testing for tularemia and the symptoms.  In fact, doxycycline is a recommended po therapy for tularemia and patient has just finished a course of that for his sinuses.  Therefore I have very low  suspicion for tularemia in this patient.  He is agreeable with the plan for follow up with his PMD and will also give referral info for ID.  Pt treated with benadryl and steroids for hives.  Patient was discharged with strict return precautions and is agreeable with this plan.        Ethelda Chick, MD 10/02/11 [phone]

## 2011-10-04 ENCOUNTER — Encounter: Payer: Self-pay | Admitting: Internal Medicine

## 2011-10-04 NOTE — Assessment & Plan Note (Signed)
Since the symptoms started while on minocin and are not localized it's most likley a viral illness and unrelated to rabbit exposure  See instructions for specific recommendations which were reviewed directly with the patient who was given a copy with highlighter outlining the key components.

## 2011-10-04 NOTE — Assessment & Plan Note (Signed)
All goals of chronic asthma control met including optimal function and elimination of symptoms with minimal need for rescue therapy.  Contingencies discussed in full including contacting this office immediately if not controlling the symptoms using the rule of two's.    

## 2011-10-29 ENCOUNTER — Ambulatory Visit (INDEPENDENT_AMBULATORY_CARE_PROVIDER_SITE_OTHER): Payer: Medicare Other | Admitting: Internal Medicine

## 2011-10-29 ENCOUNTER — Encounter: Payer: Self-pay | Admitting: Internal Medicine

## 2011-10-29 ENCOUNTER — Other Ambulatory Visit (INDEPENDENT_AMBULATORY_CARE_PROVIDER_SITE_OTHER): Payer: Medicare Other

## 2011-10-29 VITALS — BP 116/78 | HR 66 | Temp 98.1°F | Ht 65.0 in | Wt 195.0 lb

## 2011-10-29 DIAGNOSIS — E785 Hyperlipidemia, unspecified: Secondary | ICD-10-CM | POA: Diagnosis not present

## 2011-10-29 DIAGNOSIS — I10 Essential (primary) hypertension: Secondary | ICD-10-CM | POA: Diagnosis not present

## 2011-10-29 DIAGNOSIS — J45909 Unspecified asthma, uncomplicated: Secondary | ICD-10-CM

## 2011-10-29 LAB — LIPID PANEL
HDL: 49.1 mg/dL (ref >39.00)
VLDL: 25 mg/dL (ref 0.0–40.0)

## 2011-10-29 LAB — LDL CHOLESTEROL, DIRECT: Direct LDL: 148.5 mg/dL

## 2011-10-29 LAB — HEPATIC FUNCTION PANEL
AST: 27 U/L (ref 0–37)
Alkaline Phosphatase: 104 U/L (ref 39–117)
Bilirubin, Direct: 0.1 mg/dL (ref 0.0–0.3)
Total Protein: 7.2 g/dL (ref 6.0–8.3)

## 2011-10-29 MED ORDER — LORAZEPAM 0.5 MG PO TABS: ORAL_TABLET | ORAL | Status: DC

## 2011-10-29 NOTE — Progress Notes (Signed)
Subjective:    Patient ID: Antonio Carlson, male    DOB: Dec 24, 1945   MRN: [id number]  Brief patient profile:  18 yowm quit smoking in 1971 with history of [facility] Asthma and hyperlipidemia   HPI   10/01/2011 f/u ov/Gerrica Cygan cc Pt c/o chills on and off x 5 days, had night sweats last night. He also c/o weakness in legs and overall feels achy. He has prod cough with minimal clear sputum. Aches all over. On minocin until 24 h pt ov x weeks. Exposed to dead rabbits a week or so ago rec D/c minocin ? Rxn to it Push fluids   10/29/2011 f/u ov/Loryn Haacke cc all better x persistent chronic daytime  cough minimal variable daily min yellowish mucus also draining from nose but better than baseline.   Also here to f/u hbp/hyperlipidemia on zocor 40, no sob or tia or claudication symtpoms.  Sleeping ok without nocturnal or early am exacerbation of respiratory c/o's or coughing or need for noct saba. Also denies any obvious fluctuation of symptoms with weather or environmental changes or other aggravating or alleviating factors except as outlined above   ROS At present neg for any significant sore throat, dysphagia, itching, sneezing, excess/ purulent nasal secretions, chills, sweats, unintended wt loss, pleuritic or exertional cp, hempoptysis, orthopnea pnd or leg swelling. Also denies presyncope, palpitations, heartburn, abdominal pain, nausea, vomiting, diarrhea or change in bowel or urinary habits, dysuria,hematuria, rash, arthralgias, visual complaints, headache, numbness weakness or ataxia.   Allergies  1) ! Asa  2) Augmentin   Past Medical History:  PVCs.  Syncope  - Holter ordered June 27, 2008  - EP consult June 27, 2008  OSTEOPENIA (ICD-733.90)  COLONIC POLYPS (ICD-211.3) .......................................[name] Kitchen [name] DIVERTICULOSIS, MILD (ICD-562.10) - flex sig 08/12/00  - letter sent 06/05/05, reminded May 17, 2008  ASTHMA (ICD-493.90)  - HFA 90% June 11, 2009  ALLERGIC  RHINITIS, CHRONIC (ICD-477.9)........................[name]  - steroid dep until mid oct 2009  - Allergy profile sent January 23, 2010 >> IgE 18.3  - Polypectomy Sept 2012 ...................................................... [name]  BENIGN PROSTATIC HYPERTROPHY, HX OF (ICD-V13.8) .[name] Kitchen... 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 [facility]  HEALTH MAINTENANCE.......................................................[name] KitchenWert  - CPX 07/17/2011  - Pneumovax 5/06 and @ age 66 June 11, 2009  - Td 07/2005   Past Surgical History:  Appendectomy  Colonoscopy   Family History:  heart disease in his father at age 16 he was a smoker  Ca brain half brother siblings healthy but their half mother still living without ascvd  But dementia onset late 72s  Social History:  quit smoking 1971  rarely drink alcohol  Retired          Objective:   Physical Exam  wt 198 May 17, 2008 >  > 192 07/17/11 > 190 10/01/2011 > 10/29/2011  195 Ambulatory healthy appearing wm in no acute distress but mild  nasal tone to voice  HEENT: nl dentition, turbinates, and orophanx. Nl external ear canals without cough reflex  Neck without JVD/Nodes/TM  Lungs clear to A and P bilaterally without cough on insp or exp maneuvers  RRR no s3 or murmur or increase in P2  Abd soft and benign with nl excursion in the supine position. No bruits or organomegaly  Ext warm without calf tenderness, cyanosis clubbing or edema  Skin warm and dry without lesions       CXR  10/01/2011 :  No evidence of acute cardiopulmonary disease.         Assessment & Plan:

## 2011-10-29 NOTE — Patient Instructions (Signed)
Please remember to go to the lab   department downstairs for your tests - we will call you with the results when they are available.     Please schedule a follow up visit in 3 months but call sooner if needed  

## 2011-10-30 ENCOUNTER — Telehealth: Payer: Self-pay | Admitting: Internal Medicine

## 2011-10-30 NOTE — Telephone Encounter (Signed)
Notes Recorded by [name], MD on 10/29/2011 at 12:29 PM Call patient : Study is unremarkable, no change in recs - not ideal (would like to see less than 130) no change rx, work on diet   lmomtcb x1

## 2011-10-31 NOTE — Assessment & Plan Note (Signed)
Adequate control on present rx, reviewed  

## 2011-10-31 NOTE — Assessment & Plan Note (Signed)
.  All goals of chronic asthma control met including optimal function and elimination of symptoms with minimal need for rescue therapy.  Contingencies discussed in full including contacting this office immediately if not controlling the symptoms using the rule of two's.     The cough is minimal at present and probably related to persistent pnds.

## 2011-10-31 NOTE — Assessment & Plan Note (Signed)
target < 130 male, pos fm hx, h/o smoking   - 08/11/2011 change to 40 mg per day > still at goal 10/30/11   Adequate control on present rx, reviewed

## 2011-11-02 NOTE — Telephone Encounter (Signed)
Pt informed of lab results per Dr Sherene Sires

## 2011-12-14 DIAGNOSIS — J323 Chronic sphenoidal sinusitis: Secondary | ICD-10-CM | POA: Diagnosis not present

## 2011-12-14 DIAGNOSIS — J322 Chronic ethmoidal sinusitis: Secondary | ICD-10-CM | POA: Diagnosis not present

## 2011-12-14 DIAGNOSIS — J320 Chronic maxillary sinusitis: Secondary | ICD-10-CM | POA: Diagnosis not present

## 2011-12-14 DIAGNOSIS — J321 Chronic frontal sinusitis: Secondary | ICD-10-CM | POA: Diagnosis not present

## 2011-12-14 DIAGNOSIS — J309 Allergic rhinitis, unspecified: Secondary | ICD-10-CM | POA: Diagnosis not present

## 2012-01-15 DIAGNOSIS — J322 Chronic ethmoidal sinusitis: Secondary | ICD-10-CM | POA: Diagnosis not present

## 2012-01-15 DIAGNOSIS — J323 Chronic sphenoidal sinusitis: Secondary | ICD-10-CM | POA: Diagnosis not present

## 2012-01-15 DIAGNOSIS — J320 Chronic maxillary sinusitis: Secondary | ICD-10-CM | POA: Diagnosis not present

## 2012-01-15 DIAGNOSIS — J321 Chronic frontal sinusitis: Secondary | ICD-10-CM | POA: Diagnosis not present

## 2012-01-31 ENCOUNTER — Other Ambulatory Visit: Payer: Self-pay | Admitting: Internal Medicine

## 2012-02-05 ENCOUNTER — Other Ambulatory Visit (INDEPENDENT_AMBULATORY_CARE_PROVIDER_SITE_OTHER): Payer: Medicare Other

## 2012-02-05 ENCOUNTER — Ambulatory Visit (INDEPENDENT_AMBULATORY_CARE_PROVIDER_SITE_OTHER): Payer: Medicare Other | Admitting: Internal Medicine

## 2012-02-05 ENCOUNTER — Ambulatory Visit (INDEPENDENT_AMBULATORY_CARE_PROVIDER_SITE_OTHER)
Admission: RE | Admit: 2012-02-05 | Discharge: 2012-02-05 | Disposition: A | Discharge status: Home / Self Care | Payer: Medicare Other | Source: Ambulatory Visit | Attending: Internal Medicine | Admitting: Internal Medicine

## 2012-02-05 ENCOUNTER — Encounter: Payer: Self-pay | Admitting: Internal Medicine

## 2012-02-05 VITALS — BP 130/78 | HR 66 | Temp 97.2°F | Wt 191.4 lb

## 2012-02-05 DIAGNOSIS — J45909 Unspecified asthma, uncomplicated: Secondary | ICD-10-CM | POA: Diagnosis not present

## 2012-02-05 DIAGNOSIS — E785 Hyperlipidemia, unspecified: Secondary | ICD-10-CM

## 2012-02-05 DIAGNOSIS — I10 Essential (primary) hypertension: Secondary | ICD-10-CM | POA: Diagnosis not present

## 2012-02-05 DIAGNOSIS — J069 Acute upper respiratory infection, unspecified: Secondary | ICD-10-CM | POA: Diagnosis not present

## 2012-02-05 DIAGNOSIS — M25559 Pain in unspecified hip: Secondary | ICD-10-CM

## 2012-02-05 DIAGNOSIS — M25552 Pain in left hip: Secondary | ICD-10-CM | POA: Insufficient documentation

## 2012-02-05 DIAGNOSIS — Z043 Encounter for examination and observation following other accident: Secondary | ICD-10-CM | POA: Diagnosis not present

## 2012-02-05 DIAGNOSIS — M1610 Unilateral primary osteoarthritis, unspecified hip: Secondary | ICD-10-CM | POA: Insufficient documentation

## 2012-02-05 LAB — CBC WITH DIFFERENTIAL/PLATELET
Eosinophils Relative: 9.6 % — ABNORMAL HIGH (ref 0.0–5.0)
Monocytes Relative: 8.8 % (ref 3.0–12.0)
Neutrophils Relative %: 50.5 % (ref 43.0–77.0)
Platelets: 256 10*3/uL (ref 150.0–400.0)
WBC: 9 10*3/uL (ref 4.5–10.5)

## 2012-02-05 LAB — URINALYSIS
Nitrite: NEGATIVE
Specific Gravity, Urine: 1.025 (ref 1.000–1.030)
Total Protein, Urine: NEGATIVE
pH: 6 (ref 5.0–8.0)

## 2012-02-05 LAB — LIPID PANEL
Cholesterol: 197 mg/dL (ref 0–200)
LDL Cholesterol: 124 mg/dL — ABNORMAL HIGH (ref 0–99)
VLDL: 23.8 mg/dL (ref 0.0–40.0)

## 2012-02-05 LAB — HEPATIC FUNCTION PANEL
AST: 28 U/L (ref 0–37)
Alkaline Phosphatase: 89 U/L (ref 39–117)
Total Bilirubin: 0.9 mg/dL (ref 0.3–1.2)

## 2012-02-05 NOTE — Assessment & Plan Note (Signed)
-   acute onset late June 2013 assoc with ecchymosis    - Plain xray 02/05/2012  > neg  Sounds like a torn muscle or ligament with ecchymosis, resolved. Can't use nsaids so ortho eval prn recurrence

## 2012-02-05 NOTE — Progress Notes (Signed)
Quick Note:  Spoke with pt and notified of results per Dr. [name]. Pt verbalized understanding and denied any questions.  ______ 

## 2012-02-05 NOTE — Assessment & Plan Note (Addendum)
Target < 130 male, pos fm hx, h/o smoking   - 08/11/2011 change to 40 mg per day > still at goal 10/30/11   Lab Results  Component Value Date   LDLCALC 124* 02/05/2012    Adequate control on present rx, reviewed

## 2012-02-05 NOTE — Patient Instructions (Addendum)
Please remember to go to the lab   department downstairs for your tests - we will call you with the results when they are available.     Please schedule a follow up visit in 3 months but call sooner if needed  

## 2012-02-05 NOTE — Progress Notes (Signed)
Subjective:    Patient ID: Antonio Carlson, male    DOB: 1946/03/09   MRN: [id number]  Brief patient profile:  51 yowm quit smoking in 1971 with history of [facility] Asthma and hyperlipidemia     HPI 10/01/2011 f/u ov/Antonio Carlson cc Pt c/o chills on and off x 5 days, had night sweats last night. He also c/o weakness in legs and overall feels achy. He has prod cough with minimal clear sputum. Aches all over. On minocin until 24 h pt ov x weeks. Exposed to dead rabbits a week or so ago rec D/c minocin ? Rxn to it Push fluids   10/29/2011 f/u ov/Antonio Carlson cc all better x persistent chronic daytime  cough minimal variable daily min yellowish mucus also draining from nose but better than baseline.   Also here to f/u hbp/hyperlipidemia on zocor 40, no sob or tia or claudication symtpoms. rec Please remember to go to the lab:  lft's ok, ldl 142 > 148   02/05/2012 f/u ov/Antonio Carlson here for f/u lipids/ asthma cc  abrupt onset left hip pain x 2 wks ago, getting better now but wants to have xray. Pain onset while playing corn ball with assoc ecchymosis x sev inches, also resolved. No radicular features or numbness/ weakness leg or foot.   No unusual cough, purulent sputum or sinus/hb symptoms on present rx.   no rescue saba daytime    Sleeping ok without nocturnal or early am exacerbation of respiratory c/o's or coughing or need for noct saba. Also denies any obvious fluctuation of symptoms with weather or environmental changes or other aggravating or alleviating factors except as outlined above   ROS At present neg for any significant sore throat, dysphagia, itching, sneezing, excess/ purulent nasal secretions, chills, sweats, unintended wt loss, pleuritic or exertional cp, hempoptysis, orthopnea pnd or leg swelling. Also denies presyncope, palpitations, heartburn, abdominal pain, nausea, vomiting, diarrhea or change in bowel or urinary habits, dysuria,hematuria, rash, , visual complaints, headache, numbness weakness or  ataxia.   Allergies  1) ! Asa  2) Augmentin   Past Medical History:  PVCs.  Syncope  - Holter ordered June 27, 2008  - EP consult June 27, 2008  OSTEOPENIA (ICD-733.90)  COLONIC POLYPS (ICD-211.3) .......................................[name] Kitchen [name] DIVERTICULOSIS, MILD (ICD-562.10) - flex sig 08/12/00  - letter sent 06/05/05, reminded May 17, 2008  ASTHMA (ICD-493.90)  - HFA 90% June 11, 2009  ALLERGIC RHINITIS, CHRONIC (ICD-477.9)........................[name]  - steroid dep until mid oct 2009  - Allergy profile sent January 23, 2010 >> IgE 18.3  - Polypectomy Sept 2012 ...................................................... [name]  BENIGN PROSTATIC HYPERTROPHY, HX OF (ICD-V13.8) .[name] Kitchen... 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 [facility]  HEALTH MAINTENANCE.......................................................[name] KitchenWert  - CPX 07/17/2011  - Pneumovax 5/06 and @ age 66 June 11, 2009  - Td 07/2005   Past Surgical History:  Appendectomy  Colonoscopy   Family History:  heart disease in his father at age 70 he was a smoker  Ca brain half brother siblings healthy but their half mother still living without ascvd  But dementia onset late 72s  Social History:  quit smoking 1971  rarely drink alcohol  Retired          Objective:   Physical Exam  wt 198 May 17, 2008 >  > 192 07/17/11 > 190 10/01/2011 > 10/29/2011  195 > 02/05/2012  191 Ambulatory healthy appearing wm in no acute distress but mild  nasal tone to voice  HEENT: nl dentition, turbinates, and orophanx. Nl external ear canals without cough reflex  Neck without JVD/Nodes/TM  Lungs clear to A and P bilaterally without cough on insp or exp maneuvers  RRR no s3 or murmur or increase in P2  Abd soft and benign with nl excursion in the supine  position. No bruits or organomegaly  Ext warm without calf tenderness, cyanosis clubbing or edema  Skin warm and dry without lesions         L hip 02/05/2012 No acute traumatic finding. Joint space preservation bilaterally. Small lateral acetabular cyst on the left that could be insignificant or be an indicator of early degenerative change          Assessment & Plan:

## 2012-02-05 NOTE — Assessment & Plan Note (Signed)
All goals of chronic asthma control met including optimal function and elimination of symptoms with minimal need for rescue therapy.  Contingencies discussed in full including contacting this office immediately if not controlling the symptoms using the rule of two's.    

## 2012-02-08 ENCOUNTER — Other Ambulatory Visit: Payer: Self-pay | Admitting: Internal Medicine

## 2012-04-26 ENCOUNTER — Other Ambulatory Visit: Payer: Self-pay | Admitting: Internal Medicine

## 2012-05-10 ENCOUNTER — Ambulatory Visit (INDEPENDENT_AMBULATORY_CARE_PROVIDER_SITE_OTHER): Payer: Medicare Other | Admitting: Internal Medicine

## 2012-05-10 ENCOUNTER — Encounter: Payer: Self-pay | Admitting: Internal Medicine

## 2012-05-10 VITALS — BP 140/90 | HR 58 | Temp 97.3°F | Ht 68.0 in | Wt 188.4 lb

## 2012-05-10 DIAGNOSIS — Z23 Encounter for immunization: Secondary | ICD-10-CM

## 2012-05-10 DIAGNOSIS — J45909 Unspecified asthma, uncomplicated: Secondary | ICD-10-CM | POA: Diagnosis not present

## 2012-05-10 DIAGNOSIS — M25552 Pain in left hip: Secondary | ICD-10-CM

## 2012-05-10 DIAGNOSIS — M25559 Pain in unspecified hip: Secondary | ICD-10-CM

## 2012-05-10 DIAGNOSIS — I10 Essential (primary) hypertension: Secondary | ICD-10-CM

## 2012-05-10 NOTE — Assessment & Plan Note (Addendum)
-   acute onset late June 2013 assoc with ecchymosis    - Plain xray 02/05/2012  > neg    - Refer to [facility] 05/10/2012   Cannot use any nsaids safely here > refer to ortho

## 2012-05-10 NOTE — Assessment & Plan Note (Signed)
Probably Adequate control on present rx, reviewed need to use bp meds qam

## 2012-05-10 NOTE — Progress Notes (Signed)
Subjective:    Patient ID: Antonio Carlson, male    DOB: 17-Jun-1946   MRN: [id number]  Brief patient profile:  66 yowm quit smoking in 1971 with history of Tri[facility] Asthma and hyperlipidemia     HPI 10/01/2011 f/u ov/Artemis Koller cc Pt c/o chills on and off x 5 days, had night sweats last night. He also c/o weakness in legs and overall feels achy. He has prod cough with minimal clear sputum. Aches all over. On minocin until 24 h pt ov x weeks. Exposed to dead rabbits a week or so ago rec D/c minocin ? Rxn to it Push fluids   10/29/2011 f/u ov/[name] cc all better x persistent chronic daytime  cough minimal variable daily min yellowish mucus also draining from nose but better than baseline.   Also here to f/u hbp/hyperlipidemia on zocor 40, no sob or tia or claudication symtpoms. rec Please remember to go to the lab:  lft's ok, ldl 142 > 148   02/05/2012 f/u ov/Roselinda Bahena here for f/u lipids/ asthma cc  abrupt onset left hip pain x 2 wks ago, getting better now but wants to have xray. Pain onset while playing corn ball with assoc ecchymosis x sev inches, also resolved. No radicular features or numbness/ weakness leg or foot. rec Check labs > ok with ldl 124 No change rx   05/10/2012 f/u ov/[name] cc L hip pain resolved but popping in low back when walk radiates to R not bad enough to even take tylenol, no radicular symptoms or back pain.   Did not take any meds prior to today's ov. C/o mild sensation of pnds.  Not using symbicort "unless he needs it" but no saba daytime use at all.     Sleeping ok without nocturnal or early am exacerbation of respiratory c/o's or coughing or need for noct saba. Also denies any obvious fluctuation of symptoms with weather or environmental changes or other aggravating or alleviating factors except as outlined above   ROS At present neg for any significant sore throat, dysphagia, itching, sneezing, excess/ purulent nasal secretions, chills, sweats, unintended wt loss, pleuritic  or exertional cp, hempoptysis, orthopnea pnd or leg swelling. Also denies presyncope, palpitations, heartburn, abdominal pain, nausea, vomiting, diarrhea or change in bowel or urinary habits, dysuria,hematuria, rash, , visual complaints, headache, numbness weakness or ataxia.   Allergies  1) ! Asa  2) Augmentin   Past Medical History:  PVCs.  Syncope  - Holter ordered June 27, 2008  - EP consult June 27, 2008  OSTEOPENIA (ICD-733.90)  COLONIC POLYPS (ICD-211.3) .......................................[name] Kitchen [name] DIVERTICULOSIS, MILD (ICD-562.10) - flex sig 08/12/00  - letter sent 06/05/05, reminded May 17, 2008  ASTHMA (ICD-493.90)  - HFA 90% June 11, 2009  ALLERGIC RHINITIS, CHRONIC (ICD-477.9)........................[name]  - steroid dep until mid oct 2009  - Allergy profile sent January 23, 2010 >> IgE 18.3  - Polypectomy Sept 2012 ...................................................... [name]  BENIGN PROSTATIC HYPERTROPHY, HX OF (ICD-V13.8) .[name] Kitchen... 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 [facility]  HEALTH MAINTENANCE.......................................................[name] KitchenWert  - CPX 07/17/2011  - Pneumovax 5/06 and @ age 52 June 11, 2009  - Td 07/2005   Past Surgical History:  Appendectomy  Colonoscopy   Family History:  heart disease in his father at age 59 he was a smoker  Ca brain half brother siblings healthy but their half mother still living without ascvd  But dementia onset late 75s  Social  History:  quit smoking 1971  rarely drink alcohol  Retired          Objective:   Physical Exam  wt 198 May 17, 2008 >  > 192 07/17/11 >  02/05/2012  191> 05/10/2012  188 Ambulatory healthy appearing wm in no acute distress but mild  nasal tone to voice  HEENT: nl dentition, turbinates, and  orophanx. Nl external ear canals without cough reflex  Neck without JVD/Nodes/TM  Lungs clear to A and P bilaterally without cough on insp or exp maneuvers  RRR no s3 or murmur or increase in P2  Abd soft and benign with nl excursion in the supine position. No bruits or organomegaly  Ext warm without calf tenderness, cyanosis clubbing or edema  Skin warm and dry without lesions         L hip 02/05/2012 No acute traumatic finding. Joint space preservation bilaterally. Small lateral acetabular cyst on the left that could be insignificant or be an indicator of early degenerative change          Assessment & Plan:

## 2012-05-10 NOTE — Patient Instructions (Addendum)
Please see patient coordinator before you leave today  to schedule orthopedic eval for your hip and back pain  No change in medications  Please schedule a follow up visit in 3 months but call sooner if needed for CPX on return.

## 2012-05-10 NOTE — Assessment & Plan Note (Signed)
All goals of chronic asthma control met including optimal function and elimination of symptoms with minimal need for rescue therapy.  Contingencies discussed in full including contacting this office immediately if not controlling the symptoms using the rule of two's.     He's using symbicort the way it's used in [place] (prn) but seems to be self managing the problem very well    Each maintenance medication was reviewed in detail including most importantly the difference between maintenance and as needed and under what circumstances the prns are to be used.  Please see instructions for details which were reviewed in writing and the patient given a copy.

## 2012-05-14 DIAGNOSIS — M545 Low back pain: Secondary | ICD-10-CM | POA: Diagnosis not present

## 2012-05-14 DIAGNOSIS — M25519 Pain in unspecified shoulder: Secondary | ICD-10-CM | POA: Diagnosis not present

## 2012-05-16 DIAGNOSIS — J322 Chronic ethmoidal sinusitis: Secondary | ICD-10-CM | POA: Diagnosis not present

## 2012-05-16 DIAGNOSIS — J323 Chronic sphenoidal sinusitis: Secondary | ICD-10-CM | POA: Diagnosis not present

## 2012-05-16 DIAGNOSIS — J321 Chronic frontal sinusitis: Secondary | ICD-10-CM | POA: Diagnosis not present

## 2012-05-16 DIAGNOSIS — J320 Chronic maxillary sinusitis: Secondary | ICD-10-CM | POA: Diagnosis not present

## 2012-05-27 ENCOUNTER — Encounter (HOSPITAL_COMMUNITY): Payer: Self-pay | Admitting: Family Medicine

## 2012-05-27 ENCOUNTER — Emergency Department (HOSPITAL_COMMUNITY): Payer: Medicare Other

## 2012-05-27 ENCOUNTER — Emergency Department (HOSPITAL_COMMUNITY)
Admission: EM | Admit: 2012-05-27 | Discharge: 2012-05-27 | Disposition: A | Discharge status: Home / Self Care | Payer: Medicare Other | Attending: Emergency Medicine | Admitting: Emergency Medicine

## 2012-05-27 DIAGNOSIS — Z87891 Personal history of nicotine dependence: Secondary | ICD-10-CM | POA: Diagnosis not present

## 2012-05-27 DIAGNOSIS — Y929 Unspecified place or not applicable: Secondary | ICD-10-CM | POA: Insufficient documentation

## 2012-05-27 DIAGNOSIS — K219 Gastro-esophageal reflux disease without esophagitis: Secondary | ICD-10-CM | POA: Diagnosis not present

## 2012-05-27 DIAGNOSIS — N400 Enlarged prostate without lower urinary tract symptoms: Secondary | ICD-10-CM | POA: Diagnosis not present

## 2012-05-27 DIAGNOSIS — S4980XA Other specified injuries of shoulder and upper arm, unspecified arm, initial encounter: Secondary | ICD-10-CM | POA: Insufficient documentation

## 2012-05-27 DIAGNOSIS — Z888 Allergy status to other drugs, medicaments and biological substances status: Secondary | ICD-10-CM | POA: Insufficient documentation

## 2012-05-27 DIAGNOSIS — Z79899 Other long term (current) drug therapy: Secondary | ICD-10-CM | POA: Insufficient documentation

## 2012-05-27 DIAGNOSIS — IMO0002 Reserved for concepts with insufficient information to code with codable children: Secondary | ICD-10-CM | POA: Insufficient documentation

## 2012-05-27 DIAGNOSIS — J45909 Unspecified asthma, uncomplicated: Secondary | ICD-10-CM | POA: Diagnosis not present

## 2012-05-27 DIAGNOSIS — E785 Hyperlipidemia, unspecified: Secondary | ICD-10-CM | POA: Insufficient documentation

## 2012-05-27 DIAGNOSIS — S46909A Unspecified injury of unspecified muscle, fascia and tendon at shoulder and upper arm level, unspecified arm, initial encounter: Secondary | ICD-10-CM | POA: Insufficient documentation

## 2012-05-27 DIAGNOSIS — K5730 Diverticulosis of large intestine without perforation or abscess without bleeding: Secondary | ICD-10-CM | POA: Diagnosis not present

## 2012-05-27 DIAGNOSIS — Z881 Allergy status to other antibiotic agents status: Secondary | ICD-10-CM | POA: Diagnosis not present

## 2012-05-27 DIAGNOSIS — R55 Syncope and collapse: Secondary | ICD-10-CM | POA: Insufficient documentation

## 2012-05-27 DIAGNOSIS — I4949 Other premature depolarization: Secondary | ICD-10-CM | POA: Diagnosis not present

## 2012-05-27 DIAGNOSIS — Z882 Allergy status to sulfonamides status: Secondary | ICD-10-CM | POA: Insufficient documentation

## 2012-05-27 DIAGNOSIS — W1789XA Other fall from one level to another, initial encounter: Secondary | ICD-10-CM | POA: Insufficient documentation

## 2012-05-27 DIAGNOSIS — I10 Essential (primary) hypertension: Secondary | ICD-10-CM | POA: Insufficient documentation

## 2012-05-27 DIAGNOSIS — S4990XA Unspecified injury of shoulder and upper arm, unspecified arm, initial encounter: Secondary | ICD-10-CM

## 2012-05-27 DIAGNOSIS — Y9389 Activity, other specified: Secondary | ICD-10-CM | POA: Insufficient documentation

## 2012-05-27 MED ORDER — OXYCODONE-ACETAMINOPHEN 5-325 MG PO TABS: 1.0000 | ORAL_TABLET | ORAL | Status: DC | PRN

## 2012-05-27 MED ORDER — ACETAMINOPHEN 325 MG PO TABS
650.0000 mg | ORAL_TABLET | Freq: Once | ORAL | Status: AC
  Administered 2012-05-27: 650 mg via ORAL
  Filled 2012-05-27: qty 2

## 2012-05-27 NOTE — ED Notes (Signed)
Per pt fell this am hooking a trailer up and landed on left arm. sts severe pain in left shoulder and limited movement. sts he think it popped out of place and then back.

## 2012-05-27 NOTE — Progress Notes (Signed)
Orthopedic Tech Progress Note Patient Details:  ALEXANDE WILLEVER 05-20-1946 [id number] Order called for immobilizer but PA consulted about just using regular arm sling since patient is only wearing for comfort. PA stated that was fine. Arm sling applied to Left UE. Ortho Devices Type of Ortho Device: Arm foam sling Ortho Device/Splint Location: left UE Ortho Device/Splint Interventions: Application   Asia R Thompson 05/27/2012, 1:40 PM

## 2012-05-27 NOTE — ED Provider Notes (Signed)
History   This chart was scribed for Flint Melter, MD by Charolett Bumpers . The patient was seen in room TR06C/TR06C. Patient's care was started at 1221.   CSN: [id number]  Arrival date & time 05/27/12  1157   First MD Initiated Contact with Patient 05/27/12 1221      Chief Complaint  Patient presents with  . Shoulder Injury    The history is provided by the patient. No language interpreter was used.  Antonio Carlson is a 66 y.o. male who presents to the Emergency Department complaining of moderate left shoulder pain with an onset of this morning. He states he slipped and fell about an hour ago injuring his left shoulder. He states that all of his weight went on to left shoulder. He reports his pain is aggravated with movement and thinks it may have been dislocated. He denies any h/o dislocation.   Past Medical History  Diagnosis Date  . PVC's (premature ventricular contractions)   . Syncope   . Asthma   . Allergy   . Benign prostatic hypertrophy   . Diverticulosis   . Nephrolithiasis   . Hyperlipidemia   . Hypertension   . GERD (gastroesophageal reflux disease)     Past Surgical History  Procedure Date  . Appendectomy   . Nasal sinus surgery Sept 2012    History reviewed. No pertinent family history.  History  Substance Use Topics  . Smoking status: Former Smoker -- 2.0 packs/day for 14 years    Quit date: 07/27/1969  . Smokeless tobacco: Never Used  . Alcohol Use: No      Review of Systems  Constitutional: Negative for fever and chills.  Respiratory: Negative for shortness of breath.   Gastrointestinal: Negative for nausea and vomiting.  Musculoskeletal: Positive for arthralgias.       Left shoulder pain.   Neurological: Negative for weakness.  All other systems reviewed and are negative.    Allergies  Aspirin; Amoxicillin; Amoxicillin-pot clavulanate; and Sulfonamide derivatives  Home Medications   Current Outpatient Rx  Name Route Sig  Dispense Refill  . ACETAMINOPHEN 500 MG PO TABS Oral Take 500 mg by mouth every 6 (six) hours as needed. pain    . ACYCLOVIR 5 % EX OINT Topical Apply 1 application topically 4 (four) times daily as needed. Fever blisters    . ALBUTEROL 90 MCG/ACT IN AERS Inhalation Inhale 2 puffs into the lungs every 4 (four) hours as needed. For shortness of breath    . BUDESONIDE-FORMOTEROL FUMARATE 160-4.5 MCG/ACT IN AERO Inhalation Inhale 2 puffs into the lungs every 12 (twelve) hours. 1 Inhaler 12  . CALCIUM 500 PO Oral Take 1 tablet by mouth daily.     . CHLORPHENIRAMINE MALEATE 4 MG PO TABS Oral Take 4 mg by mouth daily as needed. allergies    . CLARITHROMYCIN 500 MG PO TABS Oral Take 500 mg by mouth 2 (two) times daily. For 10 days.  Last dose due tonight 05/27/12    . FLUTICASONE PROPIONATE 50 MCG/ACT NA SUSP Nasal Place 2 sprays into the nose daily.     [name] Kitchen LORAZEPAM 0.5 MG PO TABS Oral Take 0.5 mg by mouth every 8 (eight) hours as needed. anxiety    . METOPROLOL TARTRATE 50 MG PO TABS Oral Take 25 mg by mouth 2 (two) times daily.    [name] Kitchen MONTELUKAST SODIUM 10 MG PO TABS Oral Take 10 mg by mouth at bedtime.    . CENTRUM PO TABS Oral  Take 1 tablet by mouth daily.      [name] Kitchen MUPIROCIN 2 % EX OINT Topical Apply 1 application topically daily as needed. mrsa    . OMEPRAZOLE 20 MG PO CPDR Oral Take 20 mg by mouth every morning.    [name] Kitchen OXYMETAZOLINE HCL 0.05 % NA SOLN Nasal Place 2 sprays into the nose 2 (two) times daily as needed. congestion    . PREDNISONE 10 MG PO TABS Oral Take 10 mg by mouth daily. For ten days following 6 day dose pack.  Patient has approximately one week left    . SIMVASTATIN 40 MG PO TABS Oral Take 40 mg by mouth every evening.    . OXYCODONE-ACETAMINOPHEN 5-325 MG PO TABS Oral Take 1 tablet by mouth every 4 (four) hours as needed for pain. 20 tablet 0    BP 145/84  Pulse 90  Temp 97.7 F (36.5 C) (Oral)  Resp 20  SpO2 99%  Physical Exam  Nursing note and vitals  reviewed. Constitutional: He is oriented to person, place, and time. He appears well-developed and well-nourished. No distress.  HENT:  Head: Normocephalic and atraumatic.  Eyes: EOM are normal.  Neck: Neck supple. No tracheal deviation present.  Cardiovascular: Normal rate.   Pulmonary/Chest: Effort normal. No respiratory distress.  Musculoskeletal: Normal range of motion. He exhibits tenderness.       No left shoulder tenderness or deformity. Good sensation distally. Pain with external rotation and anterior flexion. Mild scapular tenderness noted.   Neurological: He is alert and oriented to person, place, and time.  Skin: Skin is warm and dry.  Psychiatric: He has a normal mood and affect. His behavior is normal.    ED Course  Procedures (including critical care time)  DIAGNOSTIC STUDIES: Oxygen Saturation is 99% on room air, normal by my interpretation.    COORDINATION OF CARE:  12:25-Discussed planned course of treatment with the patient including treatment with Tylenol and an x-ray of left shoulder, who is agreeable at this time.   12:30-Medication Orders: Acetaminophen (Tylenol) tablet 650 mg-once  13:30-Recheck-Informed pt of negative imaging results. Discussed f/u with orthopedics if symptoms persist. Will d/c home with sling.   Nursing notes, applicable records and vitals reviewed.  Radiologic Images/Reports reviewed.  Dg Shoulder Left  05/27/2012  *RADIOLOGY REPORT*  Clinical Data: Left shoulder injury  LEFT SHOULDER - 2+ VIEW  Comparison: None  Findings: Mild degenerative change involves the acromioclavicular joint.  There is no acute fracture or subluxation identified.  No radiopaque foreign body or soft tissue calcifications.  Left upper lung zone appears clear.  IMPRESSION:  1.  No acute findings.   Original Report Authenticated By: Signa Kell, M.D.      1. Shoulder injury       MDM  Shoulder injury, without fracture. Suspect rotator cuff injury. He stable  for discharge, with outpatient followup with orthopedics    I personally performed the services described in this documentation, which was scribed in my presence. The recorded information has been reviewed and considered.     Plan: Home Medications- Percocet; Home Treatments- sling for comfort; Recommended follow up- Ortho in 5 days     Flint Melter, MD 05/27/12 2125

## 2012-06-29 DIAGNOSIS — J321 Chronic frontal sinusitis: Secondary | ICD-10-CM | POA: Diagnosis not present

## 2012-06-29 DIAGNOSIS — J330 Polyp of nasal cavity: Secondary | ICD-10-CM | POA: Diagnosis not present

## 2012-06-29 DIAGNOSIS — J45909 Unspecified asthma, uncomplicated: Secondary | ICD-10-CM | POA: Diagnosis not present

## 2012-06-29 DIAGNOSIS — J323 Chronic sphenoidal sinusitis: Secondary | ICD-10-CM | POA: Diagnosis not present

## 2012-06-29 DIAGNOSIS — J320 Chronic maxillary sinusitis: Secondary | ICD-10-CM | POA: Diagnosis not present

## 2012-06-29 DIAGNOSIS — J309 Allergic rhinitis, unspecified: Secondary | ICD-10-CM | POA: Diagnosis not present

## 2012-06-29 DIAGNOSIS — J322 Chronic ethmoidal sinusitis: Secondary | ICD-10-CM | POA: Diagnosis not present

## 2012-08-07 ENCOUNTER — Other Ambulatory Visit: Payer: Self-pay | Admitting: Internal Medicine

## 2012-08-10 ENCOUNTER — Other Ambulatory Visit (INDEPENDENT_AMBULATORY_CARE_PROVIDER_SITE_OTHER): Payer: Medicare Other

## 2012-08-10 ENCOUNTER — Ambulatory Visit (INDEPENDENT_AMBULATORY_CARE_PROVIDER_SITE_OTHER): Payer: Medicare Other | Admitting: Internal Medicine

## 2012-08-10 ENCOUNTER — Ambulatory Visit (INDEPENDENT_AMBULATORY_CARE_PROVIDER_SITE_OTHER)
Admission: RE | Admit: 2012-08-10 | Discharge: 2012-08-10 | Disposition: A | Discharge status: Home / Self Care | Payer: Medicare Other | Source: Ambulatory Visit | Attending: Internal Medicine | Admitting: Internal Medicine

## 2012-08-10 ENCOUNTER — Encounter: Payer: Self-pay | Admitting: Internal Medicine

## 2012-08-10 VITALS — BP 130/90 | HR 67 | Temp 96.7°F | Ht 68.0 in | Wt 190.0 lb

## 2012-08-10 DIAGNOSIS — E785 Hyperlipidemia, unspecified: Secondary | ICD-10-CM

## 2012-08-10 DIAGNOSIS — I10 Essential (primary) hypertension: Secondary | ICD-10-CM

## 2012-08-10 DIAGNOSIS — J45909 Unspecified asthma, uncomplicated: Secondary | ICD-10-CM | POA: Diagnosis not present

## 2012-08-10 LAB — BASIC METABOLIC PANEL
Calcium: 9 mg/dL (ref 8.4–10.5)
Creatinine, Ser: 1 mg/dL (ref 0.4–1.5)
GFR: 82.54 mL/min (ref >60.00)
Glucose, Bld: 97 mg/dL (ref 70–99)
Sodium: 138 mEq/L (ref 135–145)

## 2012-08-10 LAB — URINALYSIS
Hgb urine dipstick: NEGATIVE
Urine Glucose: NEGATIVE
Urobilinogen, UA: 0.2 (ref 0.0–1.0)

## 2012-08-10 LAB — LIPID PANEL
Cholesterol: 188 mg/dL (ref 0–200)
HDL: 41.9 mg/dL (ref >39.00)
Triglycerides: 141 mg/dL (ref 0.0–149.0)
VLDL: 28.2 mg/dL (ref 0.0–40.0)

## 2012-08-10 LAB — HEPATIC FUNCTION PANEL
Albumin: 3.9 g/dL (ref 3.5–5.2)
Alkaline Phosphatase: 86 U/L (ref 39–117)

## 2012-08-10 MED ORDER — LORAZEPAM 0.5 MG PO TABS: 0.5000 mg | ORAL_TABLET | Freq: Three times a day (TID) | ORAL | Status: DC | PRN

## 2012-08-10 MED ORDER — METOPROLOL TARTRATE 50 MG PO TABS: 25.0000 mg | ORAL_TABLET | Freq: Two times a day (BID) | ORAL | Status: DC

## 2012-08-10 NOTE — Assessment & Plan Note (Signed)
Not Adequate control on present rx, reviewed need to monitor at home and call for adjustment on lopressor if needed

## 2012-08-10 NOTE — Patient Instructions (Addendum)
Please remember to go to the lab and x-ray department downstairs for your tests - we will call you with the results when they are available.     Please schedule a follow up visit in 6 months but call sooner if needed - call if bp over 130 on top or 85 on bottom 120/80 ideal

## 2012-08-10 NOTE — Progress Notes (Signed)
Quick Note:  Called, spoke with pt's wife. Informed her of cxr results and recs per Dr. Sherene Sires. She verbalized understanding of this, will inform pt and have him call back if he has any further questions or concerns. ______

## 2012-08-10 NOTE — Assessment & Plan Note (Signed)
All goals of chronic asthma control met including optimal function and elimination of symptoms with minimal need for rescue therapy.  Contingencies discussed in full including contacting this office immediately if not controlling the symptoms using the rule of two's.    

## 2012-08-10 NOTE — Progress Notes (Signed)
Subjective:    Patient ID: Antonio Carlson, male    DOB: Dec 10, 1954   MRN: [id number]  Brief patient profile:  67 yowm quit smoking in 1971 with history of [facility] Asthma and hyperlipidemia     HPI 10/01/2011 f/u ov/Antonio Carlson cc Pt c/o chills on and off x 5 days, had night sweats last night. He also c/o weakness in legs and overall feels achy. He has prod cough with minimal clear sputum. Aches all over. On minocin until 24 h pt ov x weeks. Exposed to dead rabbits a week or so ago rec D/c minocin ? Rxn to it Push fluids   10/29/2011 f/u ov/Antonio Carlson cc all better x persistent chronic daytime  cough minimal variable daily min yellowish mucus also draining from nose but better than baseline.   Also here to f/u hbp/hyperlipidemia on zocor 40, no sob or tia or claudication symtpoms. rec Please remember to go to the lab:  lft's ok, ldl 142 > 148   02/05/2012 f/u ov/Antonio Carlson here for f/u lipids/ asthma cc  abrupt onset left hip pain x 2 wks ago, getting better now but wants to have xray. Pain onset while playing corn ball with assoc ecchymosis x sev inches, also resolved. No radicular features or numbness/ weakness leg or foot. rec Check labs > ok with ldl 124 No change rx   05/10/2012 f/u ov/Antonio Carlson cc L hip pain resolved but popping in low back when walk radiates to R not bad enough to even take tylenol, no radicular symptoms or back pain.Did not take any meds prior to today's ov. C/o mild sensation of pnds.  Not using symbicort "unless he needs it" but no saba daytime use at all.  rec Ortho referral   08/10/2012 f/u ov/Antonio Carlson cc comprehensive yearly eval for hbp/ cr/asthma/ gerd doing well with no limiting sob    No obvious daytime variabilty or assoc chronic cough or cp or chest tightness, subjective wheeze  overt hb symptoms. No unusual exp hx     Sleeping ok without nocturnal or early am exacerbation of respiratory c/o's or coughing or need for noct saba. Also denies any obvious fluctuation of symptoms with  weather or environmental changes or other aggravating or alleviating factors except as outlined above   ROS  The following are not active complaints unless bolded sore throat, dysphagia, dental problems, itching, sneezing,  nasal congestion or excess/ purulent secretions, ear ache,   fever, chills, sweats, unintended wt loss, pleuritic or exertional cp, hemoptysis,  orthopnea pnd or leg swelling, presyncope, palpitations, heartburn, abdominal pain, anorexia, nausea, vomiting, diarrhea  or change in bowel or urinary habits, change in stools or urine, dysuria,hematuria,  rash, arthralgias, visual complaints, headache, numbness weakness or ataxia or problems with walking or coordination,  change in mood/affect or memory.    .   Allergies  1) ! Asa  2) Augmentin   Past Medical History:  PVCs.  Syncope  - Holter ordered June 27, 2008  - EP consult June 27, 2008  OSTEOPENIA (ICD-733.90)  COLONIC POLYPS (ICD-211.3) .......................................[name] Kitchen [name] - see colonoscopy 11/07/10  DIVERTICULOSIS, MILD (ICD-562.10) ASTHMA (ICD-493.90)  - HFA 90% June 11, 2009  ALLERGIC RHINITIS, CHRONIC (ICD-477.9)........................[name]  - steroid dep until mid oct 2009  - Allergy profile sent January 23, 2010 >> IgE 18.3  - Polypectomy Sept 2012 ...................................................... [name]  BENIGN PROSTATIC HYPERTROPHY, HX OF (ICD-V13.8) .[name] Kitchen... [name] NEPHROLITHIASIS (ICD-592.0)  HYPERLIPIDEMIA (ICD-272.4) target < 130 male, pos fm hx, h/l smoking  R Shoulder pain....................................................................[name] Kitchen  [facility]  HEALTH MAINTENANCE.......................................................[name] KitchenWert  - CPX  08/10/2012  - Pneumovax 5/2006and @ age 9 June 11, 2009  - Td 07/2005   Past Surgical History:  Appendectomy  Colonoscopy   Family History:  heart disease in his father at age 55 he was a smoker  Ca brain half  brother siblings healthy but their half mother still living without ascvd  But dementia onset late 26s  Social History:  quit smoking 1971  rarely drink alcohol  Retired          Objective:   Physical Exam  wt 198 May 17, 2008 >  > 192 07/17/11 >  02/05/2012  191> 05/10/2012  188> 08/10/2012 190 Ambulatory healthy appearing wm in no acute distress but mild  nasal tone to voice  HEENT: nl dentition, turbinates, and orophanx. Nl external ear canals without cough reflex  Neck without JVD/Nodes/TM  Lungs clear to A and P bilaterally without cough on insp or exp maneuvers  RRR no s3 or murmur or increase in P2  Abd soft and benign with nl excursion in the supine position. No bruits or organomegaly  Ext warm without calf tenderness, cyanosis clubbing or edema  Skin warm and dry without lesions  MS nl gait, no joint deform/ restrictions Neuro alert, approp, no motor or cerebellar def GU  Per Dr Annabell Howells       CXR  08/10/2012 :  No active disease. No significant change.            Assessment & Plan:

## 2012-08-10 NOTE — Assessment & Plan Note (Addendum)
Lab Results  Component Value Date   CHOL 188 08/10/2012   HDL 41.90 08/10/2012   LDLCALC 118* 08/10/2012   LDLDIRECT 148.5 10/29/2011   TRIG 141.0 08/10/2012   CHOLHDL 4 08/10/2012      target < 130 male, pos fm hx, h/o smoking   - 08/11/2011 change to 40 mg per day > at goal    target < 130 male, pos fm hx, h/o smoking   - 08/11/2011 change to 40 mg per day > still at goal 10/30/11

## 2012-08-19 NOTE — Progress Notes (Signed)
Quick Note:  Spoke with pt and notified of results per Dr. [name]. Pt verbalized understanding and denied any questions.  ______ 

## 2012-09-05 DIAGNOSIS — J322 Chronic ethmoidal sinusitis: Secondary | ICD-10-CM | POA: Diagnosis not present

## 2012-09-05 DIAGNOSIS — J320 Chronic maxillary sinusitis: Secondary | ICD-10-CM | POA: Diagnosis not present

## 2012-09-05 DIAGNOSIS — J309 Allergic rhinitis, unspecified: Secondary | ICD-10-CM | POA: Diagnosis not present

## 2012-09-05 DIAGNOSIS — J321 Chronic frontal sinusitis: Secondary | ICD-10-CM | POA: Diagnosis not present

## 2012-11-01 ENCOUNTER — Other Ambulatory Visit: Payer: Self-pay | Admitting: Internal Medicine

## 2012-11-10 ENCOUNTER — Telehealth: Payer: Self-pay | Admitting: Internal Medicine

## 2012-11-10 MED ORDER — PREDNISONE 10 MG PO TABS: ORAL_TABLET | ORAL | Status: DC

## 2012-11-10 NOTE — Telephone Encounter (Signed)
I spoke with pt. C/o increase SOB w/ activity, chest tx, wheezing, slight cough w/ milky color phlem, ears feel clogged off and on x 1 week. No f/c/s/n/v/no nasal congestion. Pt has been taking chlortrimeton 4 mg at bedtime. Doing sinus rinses. NO available appts with any doc today and TP is not in. Pt has pending appt with MW on Monday. Pt requesting to have something called in to help. Please advise thanks  Allergies  Allergen Reactions  . Aspirin     REACTION: sob  . Amoxicillin     REACTION: gi upset  . Amoxicillin-Pot Clavulanate     REACTION: gi upset  . Sulfonamide Derivatives Other (See Comments)    Doesn't remember

## 2012-11-10 NOTE — Telephone Encounter (Signed)
I spoke with pt and is aware of MW recs. RX has been called in. Nothing further was needed

## 2012-11-10 NOTE — Telephone Encounter (Signed)
Prednisone 10 mg take  4 each am x 2 days,   2 each am x 2 days,  1 each am x 2 days and stop  

## 2012-11-14 ENCOUNTER — Encounter: Payer: Self-pay | Admitting: Internal Medicine

## 2012-11-14 ENCOUNTER — Ambulatory Visit (INDEPENDENT_AMBULATORY_CARE_PROVIDER_SITE_OTHER): Payer: Medicare Other | Admitting: Internal Medicine

## 2012-11-14 VITALS — BP 142/80 | HR 76 | Temp 97.0°F | Ht 68.0 in | Wt 180.0 lb

## 2012-11-14 DIAGNOSIS — J45909 Unspecified asthma, uncomplicated: Secondary | ICD-10-CM

## 2012-11-14 DIAGNOSIS — I10 Essential (primary) hypertension: Secondary | ICD-10-CM

## 2012-11-14 DIAGNOSIS — E785 Hyperlipidemia, unspecified: Secondary | ICD-10-CM | POA: Diagnosis not present

## 2012-11-14 MED ORDER — PREDNISONE (PAK) 10 MG PO TABS: ORAL_TABLET | ORAL | Status: DC

## 2012-11-14 MED ORDER — SIMVASTATIN 80 MG PO TABS: ORAL_TABLET | ORAL | Status: DC

## 2012-11-14 NOTE — Assessment & Plan Note (Addendum)
DDX of  difficult airways managment all start with A and  include Adherence, Ace Inhibitors, Acid Reflux, Active Sinus Disease, Alpha 1 Antitripsin deficiency, Anxiety masquerading as Airways dz,  ABPA,  allergy(esp in young), Aspiration (esp in elderly), Adverse effects of DPI,  Active smokers, plus two Bs  = Bronchiectasis and Beta blocker use..and one C= CHF  Adherence is always the initial "prime suspect" and is a multilayered concern that requires a "trust but verify" approach in every patient - starting with knowing how to use medications, especially inhalers, correctly, keeping up with refills and understanding the fundamental difference between maintenance and prns vs those medications only taken for a very short course and then stopped and not refilled. The proper method of use, as well as anticipated side effects, of a metered-dose inhaler are discussed and demonstrated to the patient. Improved effectiveness after extensive coaching during this visit to a level of approximately  90% so try change to dulera 200 2bid   ? Allergy > Prednisone 10 mg take  4 each am x 2 days,   2 each am x 2 days,  1 each am x2days and stop   Struggling with meds/ formulary restrctions >  To keep things simple, I have asked the patient to first separate medicines that are perceived as maintenance, that is to be taken daily "no matter what", from those medicines that are taken on only on an as-needed basis and I have given the patient examples of both, and then return to see our NP to generate a  detailed  medication calendar which should be followed until the next physician sees the patient and updates it.    ? Acid reflux > continue ppi  ? Allergic/ asa sensitive > Prednisone 10 mg take  4 each am x 2 days,   2 each am x 2 days,  1 each am x2days and stop

## 2012-11-14 NOTE — Progress Notes (Signed)
Subjective:    Patient ID: Antonio Carlson, male    DOB: 1945/11/19   MRN: [id number]  Brief patient profile:  67 yowm quit smoking in 1971 with history of [facility] Asthma and hyperlipidemia     HPI 10/01/2011 f/u ov/[name] cc Pt c/o chills on and off x 5 days, had night sweats last night. He also c/o weakness in legs and overall feels achy. He has prod cough with minimal clear sputum. Aches all over. On minocin until 24 h pt ov x weeks. Exposed to dead rabbits a week or so ago rec D/c minocin ? Rxn to it Push fluids   10/29/2011 f/u ov/[name] cc all better x persistent chronic daytime  cough minimal variable daily min yellowish mucus also draining from nose but better than baseline.   Also here to f/u hbp/hyperlipidemia on zocor 40, no sob or tia or claudication symtpoms. rec Please remember to go to the lab:  lft's ok, ldl 142 > 148   02/05/2012 f/u ov/[name] here for f/u lipids/ asthma cc  abrupt onset left hip pain x 2 wks ago, getting better now but wants to have xray. Pain onset while playing corn ball with assoc ecchymosis x sev inches, also resolved. No radicular features or numbness/ weakness leg or foot. rec Check labs > ok with ldl 124 No change rx   05/10/2012 f/u ov/[name] cc L hip pain resolved but popping in low back when walk radiates to R not bad enough to even take tylenol, no radicular symptoms or back pain.Did not take any meds prior to today's ov. C/o mild sensation of pnds.  Not using symbicort "unless he needs it" but no saba daytime use at all.  rec Ortho referral   08/10/2012 f/u ov/[name] cc comprehensive yearly eval for hbp/ cr/asthma/ gerd doing well with no limiting sob   rec No change    11/14/2012 f/u ov/[name] re asthma, ran out of symciort x one week Chief Complaint  Patient presents with  . Follow-up    Increased DOE and cough x 2 wks. Cough is prod with white sputum. He also c/o wheezing with exertion and at night when he lies down.    no nasal flare. Turns out  he ran out of symbicort 160 about the same time his cough and sob started and can't afford this on his present formulary.  Called in prednisone and a little better. Not really understanding how to use saba but while on symbicort rarely needed  any saba at all.     No obvious daytime variabilty or assoc   cp or chest tightness, subjective wheeze  overt hb symptoms. No unusual exp hx     Sleeping ok without nocturnal or early am exacerbation of respiratory c/o's or coughing or need for noct saba. Also denies any obvious fluctuation of symptoms with weather or environmental changes or other aggravating or alleviating factors except as outlined above   ROS  The following are not active complaints unless bolded sore throat, dysphagia, dental problems, itching, sneezing,  nasal congestion or excess/ purulent secretions, ear ache,   fever, chills, sweats, unintended wt loss, pleuritic or exertional cp, hemoptysis,  orthopnea pnd or leg swelling, presyncope, palpitations, heartburn, abdominal pain, anorexia, nausea, vomiting, diarrhea  or change in bowel or urinary habits, change in stools or urine, dysuria,hematuria,  rash, arthralgias, visual complaints, headache, numbness weakness or ataxia or problems with walking or coordination,  change in mood/affect or memory.    .   Allergies  1) ! Asa  2) Augmentin   Past Medical History:  PVCs.  Syncope  - Holter ordered June 27, 2008  - EP consult June 27, 2008  OSTEOPENIA (ICD-733.90)  COLONIC POLYPS (ICD-211.3) .......................................[name] Kitchen [name] - see colonoscopy 11/07/10  DIVERTICULOSIS, MILD (ICD-562.10) ASTHMA (ICD-493.90)  - HFA 90% June 11, 2009  ALLERGIC RHINITIS, CHRONIC (ICD-477.9)........................[name]  - steroid dep until mid oct 2009  - Allergy profile sent January 23, 2010 >> IgE 18.3  - Polypectomy Sept 2012 ...................................................... [name]  BENIGN PROSTATIC HYPERTROPHY, HX  OF (ICD-V13.8) .[name] Kitchen... 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 [facility]  HEALTH MAINTENANCE.......................................................[name] KitchenWert  - CPX  08/10/2012  - Pneumovax 11/2004 and @ age 67 June 11, 2009  - Td 07/2005   Past Surgical History:  Appendectomy  Colonoscopy   Family History:  heart disease in his father at age 54 he was a smoker  Ca brain half brother siblings healthy but their half mother still living without ascvd  But dementia onset late 46s  Social History:  quit smoking 1971  rarely drink alcohol  Retired          Objective:   Physical Exam  wt 198 May 17, 2008 >  > 192 07/17/11 >  02/05/2012  191> 05/10/2012  188> 08/10/2012 190> 11/14/2012 180 Ambulatory healthy appearing wm in no acute distress but mild  nasal tone to voice  HEENT: nl dentition, turbinates, and orophanx. Nl external ear canals without cough reflex  Neck without JVD/Nodes/TM  Lungs clear to A and P bilaterally without cough on insp or exp maneuvers  RRR no s3 or murmur or increase in P2  Abd soft and benign with nl excursion in the supine position. No bruits or organomegaly  Ext warm without calf tenderness, cyanosis clubbing or edema  Skin warm and dry without lesions          CXR  08/10/2012 :  No active disease. No significant change.            Assessment & Plan:

## 2012-11-14 NOTE — Assessment & Plan Note (Signed)
target < 130 male, pos fm hx, h/o smoking   - 08/11/2011 change to 40 mg per day > still at goal 10/30/11   Lab Results  Component Value Date   CHOL 188 08/10/2012   HDL 41.90 08/10/2012   LDLCALC 118* 08/10/2012   LDLDIRECT 148.5 10/29/2011   TRIG 141.0 08/10/2012   CHOLHDL 4 08/10/2012     Adequate control on present rx, reviewed

## 2012-11-14 NOTE — Assessment & Plan Note (Signed)
Adequate control on present rx, reviewed  

## 2012-11-14 NOTE — Patient Instructions (Addendum)
Prednisone 10 mg take  4 each am x 2 days,   2 each am x 2 days,  1 each am x 2 days and stop   Dulera  200 Take 2 puffs first thing in am and then another 2 puffs about 12 hours later.   Only use your albuterol as a rescue medication to be used if you can't catch your breath by resting or doing a relaxed purse lip breathing pattern. The less you use it, the better it will work when you need it.   You will need to work through [employer] formulary to get you an affordable plan for your asthma or provide you with a plan B medicare drug for your nebulizer.  See [name] w/in 4 weeks with all your medications, even over the counter meds, separated in two separate bags, the ones you take no matter what vs the ones you stop once you feel better and take only as needed when you feel you need them.   Tammy  will generate for you a new user friendly medication calendar that will put [place] all on the same page re: your medication use.     Without this process, it simply isn't possible to assure that we are providing  your outpatient care  with  the attention to detail we feel you deserve.   If we cannot assure that you're getting that kind of care,  then we cannot manage your problem effectively from this clinic.  Once you have seen Tammy and we are sure that we're all on the same page with your medication use she will arrange follow up with me.

## 2012-11-18 ENCOUNTER — Other Ambulatory Visit: Payer: Self-pay | Admitting: Internal Medicine

## 2012-12-12 ENCOUNTER — Encounter: Payer: Self-pay | Admitting: Adult Health

## 2012-12-12 ENCOUNTER — Ambulatory Visit (INDEPENDENT_AMBULATORY_CARE_PROVIDER_SITE_OTHER): Payer: Medicare Other | Admitting: Adult Health

## 2012-12-12 VITALS — BP 132/68 | HR 56 | Temp 98.2°F | Ht 68.0 in | Wt 182.8 lb

## 2012-12-12 DIAGNOSIS — J45909 Unspecified asthma, uncomplicated: Secondary | ICD-10-CM

## 2012-12-12 NOTE — Assessment & Plan Note (Signed)
Recent flare now resolved Patient's medications were reviewed today and patient education was given. Computerized medication calendar was adjusted/completed Cont to on current regimen  follow up in 3 months

## 2012-12-12 NOTE — Progress Notes (Signed)
Subjective:    Patient ID: Antonio Carlson, male    DOB: 1946/04/29   MRN: [id number]  Brief patient profile:  67 yowm quit smoking in 1971 with history of [facility] Asthma and hyperlipidemia   HPI 10/01/2011 f/u ov/[name] cc Pt c/o chills on and off x 5 days, had night sweats last night. He also c/o weakness in legs and overall feels achy. He has prod cough with minimal clear sputum. Aches all over. On minocin until 24 h pt ov x weeks. Exposed to dead rabbits a week or so ago rec D/c minocin ? Rxn to it Push fluids   10/29/2011 f/u ov/[name] cc all better x persistent chronic daytime  cough minimal variable daily min yellowish mucus also draining from nose but better than baseline.   Also here to f/u hbp/hyperlipidemia on zocor 40, no sob or tia or claudication symtpoms. rec Please remember to go to the lab:  lft's ok, ldl 142 > 148   02/05/2012 f/u ov/[name] here for f/u lipids/ asthma cc  abrupt onset left hip pain x 2 wks ago, getting better now but wants to have xray. Pain onset while playing corn ball with assoc ecchymosis x sev inches, also resolved. No radicular features or numbness/ weakness leg or foot. rec Check labs > ok with ldl 124 No change rx   05/10/2012 f/u ov/[name] cc L hip pain resolved but popping in low back when walk radiates to R not bad enough to even take tylenol, no radicular symptoms or back pain.Did not take any meds prior to today's ov. C/o mild sensation of pnds.  Not using symbicort "unless he needs it" but no saba daytime use at all.  rec Ortho referral   08/10/2012 f/u ov/[name] cc comprehensive yearly eval for hbp/ cr/asthma/ gerd doing well with no limiting sob   rec No change    11/14/2012 f/u ov/[name] re asthma, ran out of symciort x one week Chief Complaint  Patient presents with  . Follow-up    Increased DOE and cough x 2 wks. Cough is prod with white sputum. He also c/o wheezing with exertion and at night when he lies down.    no nasal flare. Turns out he  ran out of symbicort 160 about the same time his cough and sob started and can't afford this on his present formulary.  Called in prednisone and a little better. Not really understanding how to use saba but while on symbicort rarely needed  any saba at all. >>Pred /Dulera rx   12/12/2012  Follow up and med review  Pt returns for follow up and med review . We reviewed all his meds and organized them into med calendar with pt education .  Had asthma flare last month, tx w/ prednisone taper w/ resolution of symptoms. Now back to baseline.  Rare use of ventolin rescue inhaler.  We discussed several options for controller inhaler use. Decided that overall symbicort would be cheaper despite 3rd tier . IF expense becomes a major issue will change over to Budesonide/brovana neb.  No chest pain, hemoptysis, n/v, or edmea. No nocturnal awakenings.   Allergies  1) ! Asa  2) Augmentin   Past Medical History:  PVCs.  Syncope  - Holter ordered June 27, 2008  - EP consult June 27, 2008  OSTEOPENIA (ICD-733.90)  COLONIC POLYPS (ICD-211.3) .......................................[name] Kitchen [name] - see colonoscopy 11/07/10  DIVERTICULOSIS, MILD (ICD-562.10) ASTHMA (ICD-493.90)  - HFA 90% June 11, 2009  ALLERGIC RHINITIS, CHRONIC (ICD-477.9)........................[name]  -  steroid dep until mid oct 2009  - Allergy profile sent January 23, 2010 >> IgE 18.3  - Polypectomy Sept 2012 ...................................................... [name]  BENIGN PROSTATIC HYPERTROPHY, HX OF (ICD-V13.8) .[name] Kitchen... 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 [facility]  HEALTH MAINTENANCE.......................................................[name] KitchenWert  - CPX  08/10/2012  - Pneumovax 11/2004 and @ age 67 June 11, 2009  - Td 07/2005  -Med calendar 12/12/2012   Past  Surgical History:  Appendectomy  Colonoscopy   Family History:  heart disease in his father at age 38 he was a smoker  Ca brain half brother siblings healthy but their half mother still living without ascvd  But dementia onset late 37s  Social History:  quit smoking 1971  rarely drink alcohol  Retired    ROS Constitutional:   No  weight loss, night sweats,  Fevers, chills, fatigue, or  lassitude.  HEENT:   No headaches,  Difficulty swallowing,  Tooth/dental problems, or  Sore throat,                No sneezing, itching, ear ache,  +nasal congestion, post nasal drip,   CV:  No chest pain,  Orthopnea, PND, swelling in lower extremities, anasarca, dizziness, palpitations, syncope.   GI  No heartburn, indigestion, abdominal pain, nausea, vomiting, diarrhea, change in bowel habits, loss of appetite, bloody stools.   Resp: No shortness of breath with exertion or at rest.  No excess mucus, no productive cough,  No non-productive cough,  No coughing up of blood.  No change in color of mucus.  No wheezing.  No chest wall deformity  Skin: no rash or lesions.  GU: no dysuria, change in color of urine, no urgency or frequency.  No flank pain, no hematuria   MS:  No joint swelling.  No decreased range of motion.  No back pain.  Psych:  No change in mood or affect. No depression or anxiety.  No memory loss.           Objective:   Physical Exam  wt 198 May 17, 2008 >  > 192 07/17/11 >  02/05/2012  191> 05/10/2012  188> 08/10/2012 190> 11/14/2012 180>182 12/12/2012  Ambulatory healthy appearing wm in no acute distress  HEENT: nl dentition, turbinates, and orophanx. Nl external ear canals without cough reflex  Neck without JVD/Nodes/TM  Lungs clear to A and P bilaterally without cough on insp or exp maneuvers  RRR no s3 or murmur or increase in P2  Abd soft and benign with nl excursion in the supine position. No bruits or organomegaly  Ext warm without calf tenderness, cyanosis  clubbing or edema  Skin warm and dry without lesions          CXR  08/10/2012 :  No active disease. No significant change.            Assessment & Plan:

## 2012-12-12 NOTE — Patient Instructions (Addendum)
Follow med calendar closely and bring to each visit.  Continue on current regimen .  follow up Dr. [name]  In 3 months and As needed   

## 2012-12-15 NOTE — Addendum Note (Signed)
Addended by: Boone Master E on: 12/15/2012 03:13 PM   Modules accepted: Orders

## 2013-02-16 ENCOUNTER — Telehealth: Payer: Self-pay | Admitting: Internal Medicine

## 2013-02-16 MED ORDER — MONTELUKAST SODIUM 10 MG PO TABS: 10.0000 mg | ORAL_TABLET | Freq: Every evening | ORAL | Status: DC

## 2013-02-16 NOTE — Telephone Encounter (Signed)
Called and spoke with pt and he is aware of refill sent in to the pharmacy

## 2013-02-19 ENCOUNTER — Other Ambulatory Visit: Payer: Self-pay | Admitting: Internal Medicine

## 2013-03-14 ENCOUNTER — Other Ambulatory Visit (INDEPENDENT_AMBULATORY_CARE_PROVIDER_SITE_OTHER): Payer: Medicare Other

## 2013-03-14 ENCOUNTER — Ambulatory Visit (INDEPENDENT_AMBULATORY_CARE_PROVIDER_SITE_OTHER): Payer: Medicare Other | Admitting: Internal Medicine

## 2013-03-14 ENCOUNTER — Encounter: Payer: Self-pay | Admitting: Internal Medicine

## 2013-03-14 VITALS — BP 130/70 | HR 61 | Temp 97.4°F | Ht 68.0 in | Wt 182.4 lb

## 2013-03-14 DIAGNOSIS — E785 Hyperlipidemia, unspecified: Secondary | ICD-10-CM

## 2013-03-14 DIAGNOSIS — J45909 Unspecified asthma, uncomplicated: Secondary | ICD-10-CM | POA: Diagnosis not present

## 2013-03-14 DIAGNOSIS — I10 Essential (primary) hypertension: Secondary | ICD-10-CM | POA: Diagnosis not present

## 2013-03-14 LAB — HEPATIC FUNCTION PANEL
AST: 24 U/L (ref 0–37)
Albumin: 4 g/dL (ref 3.5–5.2)
Alkaline Phosphatase: 87 U/L (ref 39–117)
Total Protein: 7.4 g/dL (ref 6.0–8.3)

## 2013-03-14 LAB — LIPID PANEL
Cholesterol: 192 mg/dL (ref 0–200)
HDL: 46.6 mg/dL (ref >39.00)
LDL Cholesterol: 122 mg/dL — ABNORMAL HIGH (ref 0–99)
Triglycerides: 116 mg/dL (ref 0.0–149.0)

## 2013-03-14 NOTE — Progress Notes (Signed)
Subjective:    Patient ID: BLU LORI, male    DOB: 1945-12-22   MRN: [id number]  Brief patient profile:  64 yowm quit smoking in 1971 with history of [facility] Asthma and hyperlipidemia   HPI 10/01/2011 f/u ov/Homer Miller cc Pt c/o chills on and off x 5 days, had night sweats last night. He also c/o weakness in legs and overall feels achy. He has prod cough with minimal clear sputum. Aches all over. On minocin until 24 h pt ov x weeks. Exposed to dead rabbits a week or so ago rec D/c minocin ? Rxn to it Push fluids   10/29/2011 f/u ov/Aleksei Goodlin cc all better x persistent chronic daytime  cough minimal variable daily min yellowish mucus also draining from nose but better than baseline.   Also here to f/u hbp/hyperlipidemia on zocor 40, no sob or tia or claudication symtpoms. rec Please remember to go to the lab:  lft's ok, ldl 142 > 148   02/05/2012 f/u ov/Glady Ouderkirk here for f/u lipids/ asthma cc  abrupt onset left hip pain x 2 wks ago, getting better now but wants to have xray. Pain onset while playing corn ball with assoc ecchymosis x sev inches, also resolved. No radicular features or numbness/ weakness leg or foot. rec Check labs > ok with ldl 124 No change rx   05/10/2012 f/u ov/Rahman Ferrall cc L hip pain resolved but popping in low back when walk radiates to R not bad enough to even take tylenol, no radicular symptoms or back pain.Did not take any meds prior to today's ov. C/o mild sensation of pnds.  Not using symbicort "unless he needs it" but no saba daytime use at all.  rec Ortho referral   08/10/2012 f/u ov/Ramar Nobrega cc comprehensive yearly eval for hbp/ cr/asthma/ gerd doing well with no limiting sob   rec No change    11/14/2012 f/u ov/Brando Taves re asthma, ran out of symciort x one week Chief Complaint  Patient presents with  . Follow-up    Increased DOE and cough x 2 wks. Cough is prod with white sputum. He also c/o wheezing with exertion and at night when he lies down.    no nasal flare. Turns out he  ran out of symbicort 160 about the same time his cough and sob started and can't afford this on his present formulary.  Called in prednisone and a little better. Not really understanding how to use saba but while on symbicort rarely needed  any saba at all. >>Pred /Dulera rx    03/14/2013 f/u ov/Yazlynn Birkeland re asthma/ hyperlipidemia / hbp Chief Complaint  Patient presents with  . 3 month follow up    Breathing doing well overall.  SOB and cough have resolved. No wheezing, chest tightness, or chest pain.     tapered symbicort 160 on his own to one puff daily with no increase in sob, cough and no need for saba  No obvious daytime variabilty or assoc chronic cough or cp or chest tightness, subjective wheeze overt sinus or hb symptoms. No unusual exp hx or h/o childhood pna/ asthma or knowledge of premature birth.  Sleeping ok without nocturnal  or early am exacerbation  of respiratory  c/o's or need for noct saba. Also denies any obvious fluctuation of symptoms with weather or environmental changes or other aggravating or alleviating factors except as outlined above   Current Medications, Allergies, Past Medical History, Past Surgical History, Family History, and Social History were reviewed in [employer]  record.  ROS  The following are not active complaints unless bolded sore throat, dysphagia, dental problems, itching, sneezing,  nasal congestion or excess/ purulent secretions, ear ache,   fever, chills, sweats, unintended wt loss, pleuritic or exertional cp, hemoptysis,  orthopnea pnd or leg swelling, presyncope, palpitations, heartburn, abdominal pain, anorexia, nausea, vomiting, diarrhea  or change in bowel or urinary habits, change in stools or urine, dysuria,hematuria,  rash, arthralgias, visual complaints, headache, numbness weakness or ataxia or problems with walking or coordination,  change in mood/affect or memory.        Past Medical History:  PVCs.  Syncope  -  Holter ordered June 27, 2008  - EP consult June 27, 2008  OSTEOPENIA (ICD-733.90)  COLONIC POLYPS (ICD-211.3) .......................................[name] Kitchen [name] - see colonoscopy 11/07/10  DIVERTICULOSIS, MILD (ICD-562.10) ASTHMA (ICD-493.90)  - HFA 90% June 11, 2009  ALLERGIC RHINITIS, CHRONIC (ICD-477.9)........................[name]  - steroid dep until mid oct 2009  - Allergy profile sent January 23, 2010 >> IgE 18.3  - Polypectomy Sept 2012 ...................................................... [name]  BENIGN PROSTATIC HYPERTROPHY, HX OF (ICD-V13.8) .[name] Kitchen... 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  [facility]  HEALTH MAINTENANCE.......................................................[name] KitchenWert  - CPX  08/10/2012  - Pneumovax 11/2004 and @ age 40 June 11, 2009  - Td 07/2005  -Med calendar 12/12/2012   Past Surgical History:  Appendectomy  Colonoscopy   Family History:  heart disease in his father at age 27 he was a smoker  Ca brain half brother siblings healthy but their half mother still living without ascvd  But dementia onset late 24s  Social History:  quit smoking 1971  rarely drink alcohol  Retired               Objective:   Physical Exam  wt 198 May 17, 2008 >  > 192 07/17/11 >  02/05/2012  191> 05/10/2012  188> 08/10/2012 190> 11/14/2012 180>182 12/12/2012 > 03/14/2013 182  Ambulatory healthy appearing wm in no acute distress  HEENT: nl dentition, turbinates, and orophanx. Nl external ear canals without cough reflex  Neck without JVD/Nodes/TM  Lungs clear to A and P bilaterally without cough on insp or exp maneuvers  RRR no s3 or murmur or increase in P2  Abd soft and benign with nl excursion in the supine position. No bruits or organomegaly  Ext warm without calf tenderness, cyanosis clubbing  or edema  Skin warm and dry without lesions          CXR  08/10/2012 :  No active disease. No significant change.            Assessment & Plan:

## 2013-03-14 NOTE — Assessment & Plan Note (Signed)
target < 130 male, pos fm hx, h/o smoking   - 08/11/2011 change to 40 mg per day     Lab Results  Component Value Date   CHOL 192 03/14/2013   HDL 46.60 03/14/2013   LDLCALC 122* 03/14/2013   LDLDIRECT 148.5 10/29/2011   TRIG 116.0 03/14/2013   CHOLHDL 4 03/14/2013     Adequate control on present rx, reviewed > no change in rx needed

## 2013-03-14 NOTE — Patient Instructions (Addendum)
Please remember to go to the lab department downstairs for your tests - we will call you with the results when they are available.  See calendar for specific medication instructions and bring it back for each and every office visit for every healthcare provider you see.  Without it,  you may not receive the best quality medical care that we feel you deserve.  You will note that the calendar groups together  your maintenance  medications that are timed at particular times of the day.  Think of this as your checklist for what your doctor has instructed you to do until your next evaluation to see what benefit  there is  to staying on a consistent group of medications intended to keep you well.  The other group at the bottom is entirely up to you to use as you see fit  for specific symptoms that may arise between visits that require you to treat them on an as needed basis.  Think of this as your action plan or "what if" list.   Separating the top medications from the bottom group is fundamental to providing you adequate care going forward.    Please schedule a follow up visit in 3 months but call sooner if needed     

## 2013-03-14 NOTE — Assessment & Plan Note (Signed)
All goals of chronic asthma control met including optimal function and elimination of symptoms with minimal need for rescue therapy.  Contingencies discussed in full including contacting this office immediately if not controlling the symptoms using the rule of two's.     Although not standard, the way he uses symbiocrt is similar to [place] so ok to do this as long as remember immediately to increase to 2 bid in event of symptoms or need for saba

## 2013-03-14 NOTE — Assessment & Plan Note (Signed)
Adequate control on present rx, reviewed > no change in rx needed   

## 2013-07-03 ENCOUNTER — Encounter: Payer: Self-pay | Admitting: Internal Medicine

## 2013-07-03 ENCOUNTER — Ambulatory Visit (INDEPENDENT_AMBULATORY_CARE_PROVIDER_SITE_OTHER): Payer: Medicare Other | Admitting: Internal Medicine

## 2013-07-03 VITALS — BP 138/80 | HR 63 | Temp 97.5°F | Ht 69.0 in | Wt 185.0 lb

## 2013-07-03 DIAGNOSIS — J309 Allergic rhinitis, unspecified: Secondary | ICD-10-CM

## 2013-07-03 DIAGNOSIS — J45909 Unspecified asthma, uncomplicated: Secondary | ICD-10-CM | POA: Diagnosis not present

## 2013-07-03 MED ORDER — PREDNISONE (PAK) 10 MG PO TABS: ORAL_TABLET | ORAL | Status: DC

## 2013-07-03 NOTE — Progress Notes (Signed)
Subjective:    Patient ID: Antonio Carlson, male    DOB: [birth year]   MRN: [id number]  Brief patient profile:  50 yowm quit smoking in 1971 with history of Triad Asthma and hyperlipidemia   HPI 10/01/2011 f/u ov/Antonio Carlson cc Pt c/o chills on and off x 5 days, had night sweats last night. He also c/o weakness in legs and overall feels achy. He has prod cough with minimal clear sputum. Aches all over. On minocin until 24 h pt ov x weeks. Exposed to dead rabbits a week or so ago rec D/c minocin ? Rxn to it Push fluids   10/29/2011 f/u ov/Antonio Carlson cc all better x persistent chronic daytime  cough minimal variable daily min yellowish mucus also draining from nose but better than baseline.   Also here to f/u hbp/hyperlipidemia on zocor 40, no sob or tia or claudication symtpoms. rec Please remember to go to the lab:  lft's ok, ldl 142 > 148   02/05/2012 f/u ov/Antonio Carlson here for f/u lipids/ asthma cc  abrupt onset left hip pain x 2 wks ago, getting better now but wants to have xray. Pain onset while playing corn ball with assoc ecchymosis x sev inches, also resolved. No radicular features or numbness/ weakness leg or foot. rec Check labs > ok with ldl 124 No change rx   05/10/2012 f/u ov/Antonio Carlson cc L hip pain resolved but popping in low back when walk radiates to R not bad enough to even take tylenol, no radicular symptoms or back pain.Did not take any meds prior to today's ov. C/o mild sensation of pnds.  Not using symbicort "unless he needs it" but no saba daytime use at all.  rec Ortho referral   08/10/2012 f/u ov/Antonio Carlson cc comprehensive yearly eval for hbp/ cr/asthma/ gerd doing well with no limiting sob   rec No change    11/14/2012 f/u ov/Antonio Carlson re asthma, ran out of symciort x one week Chief Complaint  Patient presents with  . Follow-up    Increased DOE and cough x 2 wks. Cough is prod with white sputum. He also c/o wheezing with exertion and at night when he lies down.    no nasal flare. Turns out he  ran out of symbicort 160 about the same time his cough and sob started and can't afford this on his present formulary.  Called in prednisone and a little better. Not really understanding how to use saba but while on symbicort rarely needed  any saba at all. >>Pred /Dulera rx    03/14/2013 f/u ov/[name] re asthma/ hyperlipidemia / hbp Chief Complaint  Patient presents with  . 3 month follow up    Breathing doing well overall.  SOB and cough have resolved. No wheezing, chest tightness, or chest pain.     tapered symbicort 160 on his own to one puff daily with no increase in sob, cough and no need for saba  07/03/2013 acute  ov/Antonio Carlson re:  Chief Complaint  Patient presents with  . Acute Visit    Pt c/o cough x 4 days- prod with moderate clear sputum. He also c/o rhinitis and left ear "stopped up" since this am.  Did have sore throat but resolved, no rigors or HA or sob over baseline, still just using the symbicort 1 pff in am  No obvious daytime variabilty or assoc cp or chest tightness, subjective wheeze overt sinus or hb symptoms. No unusual exp hx or h/o childhood pna/ asthma or knowledge of premature birth.  Sleeping ok without nocturnal  or early am exacerbation  of respiratory  c/o's or need for noct saba. Also denies any obvious fluctuation of symptoms with weather or environmental changes or other aggravating or alleviating factors except as outlined above   Current Medications, Allergies, Past Medical History, Past Surgical History, Family History, and Social History were reviewed in [employer] record.  ROS  The following are not active complaints unless bolded sore throat, dysphagia, dental problems, itching, sneezing,  nasal congestion or excess/ purulent secretions, ear ache,   fever, chills, sweats, unintended wt loss, pleuritic or exertional cp, hemoptysis,  orthopnea pnd or leg swelling, presyncope, palpitations, heartburn, abdominal pain, anorexia, nausea,  vomiting, diarrhea  or change in bowel or urinary habits, change in stools or urine, dysuria,hematuria,  rash, arthralgias, visual complaints, headache, numbness weakness or ataxia or problems with walking or coordination,  change in mood/affect or memory.        Past Medical History:  PVCs.  Syncope  - Holter ordered June 27, 2008  - EP consult June 27, 2008  OSTEOPENIA (ICD-733.90)  COLONIC POLYPS (ICD-211.3) .......................................Antonio Carlson [name] - see colonoscopy 11/07/10  DIVERTICULOSIS, MILD (ICD-562.10) ASTHMA (ICD-493.90)  - HFA 90% June 11, 2009  ALLERGIC RHINITIS, CHRONIC (ICD-477.9)........................Antonio Carlson  - steroid dep until mid oct 2009  - Allergy profile sent January 23, 2010 >> IgE 18.3  - Polypectomy Sept 2012 ...................................................... Antonio Carlson  BENIGN PROSTATIC HYPERTROPHY, HX OF (ICD-V13.8) .Antonio Carlson... 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 [facility]  HEALTH MAINTENANCE.......................................................Antonio KitchenWert  - CPX  08/10/2012  - Pneumovax 11/2004 and @ age 46 June 11, 2009  - Td 07/2005  -Med calendar 12/12/2012   Past Surgical History:  Appendectomy  Colonoscopy   Family History:  heart disease in his father at age 9 he was a smoker  Ca brain half brother siblings healthy but their half mother still living without ascvd  But dementia onset late 16s  Social History:  quit smoking 1971  rarely drink alcohol  Retired               Objective:   Physical Exam  wt 198 May 17, 2008 >  > 192 07/17/11 >  02/05/2012  191> 05/10/2012  188> 08/10/2012 190> 11/14/2012 180>182 12/12/2012 > 03/14/2013 182 > 07/06/2013 185 Ambulatory healthy appearing wm in no acute distress  HEENT: nl dentition, turbinates, and orophanx. Nl  external ear canals without cough reflex  Neck without JVD/Nodes/TM  Lungs clear to A and P bilaterally without cough on insp or exp maneuvers  RRR no s3 or murmur or increase in P2  Abd soft and benign with nl excursion in the supine position. No bruits or organomegaly  Ext warm without calf tenderness, cyanosis clubbing or edema  Skin warm and dry without lesions          CXR  08/10/2012 :  No active disease. No significant change.            Assessment & Plan:

## 2013-07-03 NOTE — Patient Instructions (Signed)
Prednisone 10 mg take  4 each am x 2 days,   2 each am x 2 days,  1 each am x 2 days and stop   For cough take delsym 2 tsp every 12 hours and pepcid ac 20 mg at bedtime  Please schedule a follow up office visit in 6 weeks, call sooner if needed for CPX

## 2013-07-06 NOTE — Assessment & Plan Note (Signed)
No worse  despite rhinitis flare > Adequate control on present rx, reviewed > no change in rx needed      Each maintenance medication was reviewed in detail including most importantly the difference between maintenance and as needed and under what circumstances the prns are to be used. This was done in the context of a medication calendar review which provided the patient with a user-friendly unambiguous mechanism for medication administration and reconciliation and provides an action plan for all active problems. It is critical that this be shown to every doctor  for modification during the office visit if necessary so the patient can use it as a working document.

## 2013-07-06 NOTE — Assessment & Plan Note (Signed)
Mild flare s evidence infection > Prednisone 10 mg take  4 each am x 2 days,   2 each am x 2 days,  1 each am x 2 days and stop

## 2013-08-14 ENCOUNTER — Ambulatory Visit (INDEPENDENT_AMBULATORY_CARE_PROVIDER_SITE_OTHER)
Admission: RE | Admit: 2013-08-14 | Discharge: 2013-08-14 | Disposition: A | Discharge status: Home / Self Care | Payer: Medicare Other | Source: Ambulatory Visit | Attending: Internal Medicine | Admitting: Internal Medicine

## 2013-08-14 ENCOUNTER — Ambulatory Visit (INDEPENDENT_AMBULATORY_CARE_PROVIDER_SITE_OTHER): Payer: Medicare Other | Admitting: Internal Medicine

## 2013-08-14 ENCOUNTER — Other Ambulatory Visit (INDEPENDENT_AMBULATORY_CARE_PROVIDER_SITE_OTHER): Payer: Medicare Other

## 2013-08-14 ENCOUNTER — Encounter: Payer: Self-pay | Admitting: Internal Medicine

## 2013-08-14 VITALS — BP 112/80 | HR 60 | Temp 97.8°F | Ht 67.0 in | Wt 184.0 lb

## 2013-08-14 DIAGNOSIS — Z23 Encounter for immunization: Secondary | ICD-10-CM

## 2013-08-14 DIAGNOSIS — I10 Essential (primary) hypertension: Secondary | ICD-10-CM

## 2013-08-14 DIAGNOSIS — E785 Hyperlipidemia, unspecified: Secondary | ICD-10-CM

## 2013-08-14 DIAGNOSIS — R05 Cough: Secondary | ICD-10-CM | POA: Diagnosis not present

## 2013-08-14 DIAGNOSIS — J309 Allergic rhinitis, unspecified: Secondary | ICD-10-CM

## 2013-08-14 DIAGNOSIS — R059 Cough, unspecified: Secondary | ICD-10-CM | POA: Diagnosis not present

## 2013-08-14 DIAGNOSIS — J45909 Unspecified asthma, uncomplicated: Secondary | ICD-10-CM

## 2013-08-14 LAB — CBC WITH DIFFERENTIAL/PLATELET
BASOS ABS: 0.1 10*3/uL (ref 0.0–0.1)
Basophils Relative: 1 % (ref 0.0–3.0)
Eosinophils Absolute: 0.6 10*3/uL (ref 0.0–0.7)
Eosinophils Relative: 8 % — ABNORMAL HIGH (ref 0.0–5.0)
HCT: 47 % (ref 39.0–52.0)
HEMOGLOBIN: 15.7 g/dL (ref 13.0–17.0)
LYMPHS PCT: 35.6 % (ref 12.0–46.0)
Lymphs Abs: 2.7 10*3/uL (ref 0.7–4.0)
MCHC: 33.5 g/dL (ref 30.0–36.0)
MCV: 83.3 fl (ref 78.0–100.0)
MONOS PCT: 9.1 % (ref 3.0–12.0)
Monocytes Absolute: 0.7 10*3/uL (ref 0.1–1.0)
NEUTROS ABS: 3.5 10*3/uL (ref 1.4–7.7)
NEUTROS PCT: 46.3 % (ref 43.0–77.0)
Platelets: 256 10*3/uL (ref 150.0–400.0)
RBC: 5.64 Mil/uL (ref 4.22–5.81)
RDW: 14.7 % — AB (ref 11.5–14.6)
WBC: 7.6 10*3/uL (ref 4.5–10.5)

## 2013-08-14 LAB — URINALYSIS
Bilirubin Urine: NEGATIVE
Hgb urine dipstick: NEGATIVE
Ketones, ur: NEGATIVE
Leukocytes, UA: NEGATIVE
Nitrite: NEGATIVE
PH: 5.5 (ref 5.0–8.0)
Total Protein, Urine: NEGATIVE
URINE GLUCOSE: NEGATIVE
UROBILINOGEN UA: 0.2 (ref 0.0–1.0)

## 2013-08-14 LAB — BASIC METABOLIC PANEL
BUN: 25 mg/dL — ABNORMAL HIGH (ref 6–23)
CALCIUM: 8.9 mg/dL (ref 8.4–10.5)
CO2: 30 mEq/L (ref 19–32)
Chloride: 102 mEq/L (ref 96–112)
Creatinine, Ser: 1 mg/dL (ref 0.4–1.5)
GFR: 75.04 mL/min (ref >60.00)
Glucose, Bld: 89 mg/dL (ref 70–99)
POTASSIUM: 4.2 meq/L (ref 3.5–5.1)
Sodium: 139 mEq/L (ref 135–145)

## 2013-08-14 LAB — LIPID PANEL
CHOL/HDL RATIO: 4
Cholesterol: 196 mg/dL (ref 0–200)
HDL: 49.8 mg/dL (ref >39.00)
LDL Cholesterol: 127 mg/dL — ABNORMAL HIGH (ref 0–99)
Triglycerides: 94 mg/dL (ref 0.0–149.0)
VLDL: 18.8 mg/dL (ref 0.0–40.0)

## 2013-08-14 LAB — TSH: TSH: 1.44 u[IU]/mL (ref 0.35–5.50)

## 2013-08-14 LAB — HEPATIC FUNCTION PANEL
ALT: 27 U/L (ref 0–53)
AST: 30 U/L (ref 0–37)
Albumin: 4 g/dL (ref 3.5–5.2)
Alkaline Phosphatase: 82 U/L (ref 39–117)
BILIRUBIN DIRECT: 0.1 mg/dL (ref 0.0–0.3)
Total Bilirubin: 0.7 mg/dL (ref 0.3–1.2)
Total Protein: 7.3 g/dL (ref 6.0–8.3)

## 2013-08-14 MED ORDER — FLUTICASONE PROPIONATE 50 MCG/ACT NA SUSP: NASAL | Status: DC

## 2013-08-14 MED ORDER — MONTELUKAST SODIUM 10 MG PO TABS: 10.0000 mg | ORAL_TABLET | Freq: Every evening | ORAL | Status: DC

## 2013-08-14 NOTE — Patient Instructions (Addendum)
Please remember to go to the lab and x-ray department downstairs for your tests - we will call you with the results when they are available.     See calendar for specific medication instructions and bring it back for each and every office visit for every healthcare provider you see.  Without it,  you may not receive the best quality medical care that we feel you deserve.  You will note that the calendar groups together  your maintenance  medications that are timed at particular times of the day.  Think of this as your checklist for what your doctor has instructed you to do until your next evaluation to see what benefit  there is  to staying on a consistent group of medications intended to keep you well.  The other group at the bottom is entirely up to you to use as you see fit  for specific symptoms that may arise between visits that require you to treat them on an as needed basis.  Think of this as your action plan or "what if" list.   Separating the top medications from the bottom group is fundamental to providing you adequate care going forward.    Please schedule a follow up visit in 3 months but call sooner if needed  

## 2013-08-14 NOTE — Progress Notes (Signed)
Subjective:    Patient ID: Antonio Carlson, male    DOB: 1946/04/27   MRN: [id number]  Brief patient profile:  64 yowm quit smoking in 1971 with history of [facility] Asthma and hyperlipidemia   HPI 10/01/2011 f/u ov/Antonio Carlson cc Pt c/o chills on and off x 5 days, had night sweats last night. He also c/o weakness in legs and overall feels achy. He has prod cough with minimal clear sputum. Aches all over. On minocin until 24 h pt ov x weeks. Exposed to dead rabbits a week or so ago rec D/c minocin ? Rxn to it Push fluids   10/29/2011 f/u ov/Antonio Carlson cc all better x persistent chronic daytime  cough minimal variable daily min yellowish mucus also draining from nose but better than baseline.   Also here to f/u hbp/hyperlipidemia on zocor 40, no sob or tia or claudication symtpoms. rec Please remember to go to the lab:  lft's ok, ldl 142 > 148   02/05/2012 f/u ov/Antonio Carlson here for f/u lipids/ asthma cc  abrupt onset left hip pain x 2 wks ago, getting better now but wants to have xray. Pain onset while playing corn ball with assoc ecchymosis x sev inches, also resolved. No radicular features or numbness/ weakness leg or foot. rec Check labs > ok with ldl 124 No change rx   05/10/2012 f/u ov/Antonio Carlson cc L hip pain resolved but popping in low back when walk radiates to R not bad enough to even take tylenol, no radicular symptoms or back pain.Did not take any meds prior to today's ov. C/o mild sensation of pnds.  Not using symbicort "unless he needs it" but no saba daytime use at all.  rec Ortho referral   08/10/2012 f/u ov/Antonio Carlson cc comprehensive yearly eval for hbp/ cr/asthma/ gerd doing well with no limiting sob   rec No change    11/14/2012 f/u ov/Antonio Carlson re asthma, ran out of symciort x one week Chief Complaint  Patient presents with  . Follow-up    Increased DOE and cough x 2 wks. Cough is prod with white sputum. He also c/o wheezing with exertion and at night when he lies down.    no nasal flare. Turns out he  ran out of symbicort 160 about the same time his cough and sob started and can't afford this on his present formulary.  Called in prednisone and a little better. Not really understanding how to use saba but while on symbicort rarely needed  any saba at all. >>Pred /Dulera rx    03/14/2013 f/u ov/Antonio Carlson re asthma/ hyperlipidemia / hbp Chief Complaint  Patient presents with  . 3 month follow up    Breathing doing well overall.  SOB and cough have resolved. No wheezing, chest tightness, or chest pain.     tapered symbicort 160 on his own to one puff daily with no increase in sob, cough and no need for saba  07/03/2013 acute  ov/Antonio Carlson re:  Chief Complaint  Patient presents with  . Acute Visit    Pt c/o cough x 4 days- prod with moderate clear sputum. He also c/o rhinitis and left ear "stopped up" since this am.  Did have sore throat but resolved, no rigors or HA or sob over baseline, still just using the symbicort 1 pff in am rec Prednisone 10 mg take  4 each am x 2 days,   2 each am x 2 days,  1 each am x 2 days and stop  For cough take  delsym 2 tsp every 12 hours and pepcid ac 20 mg at bedtime> resolved    08/14/2013 f/u ov/Antonio Carlson re:  Yearly eval: Asthma/ hbp/ hyperlipidemia  Chief Complaint  Patient presents with  . Annual Exam    Pt fasting. He states he is doing well and denies any co's today.   Nasal drainage, watery, has not used the afrin approp No need for saba  No tia, claudication, no sob at all  No obvious daytime variabilty or assoc cp or chest tightness, subjective wheeze overt sinus or hb symptoms. No unusual exp hx or h/o childhood pna/ asthma or knowledge of premature birth.  Sleeping ok without nocturnal  or early am exacerbation  of respiratory  c/o's or need for noct saba. Also denies any obvious fluctuation of symptoms with weather or environmental changes or other aggravating or alleviating factors except as outlined above   Current Medications, Allergies, Past Medical  History, Past Surgical History, Family History, and Social History were reviewed in [employer] record.  ROS  The following are not active complaints unless bolded sore throat, dysphagia, dental problems, itching, sneezing,  nasal congestion or excess/ purulent secretions, ear ache,   fever, chills, sweats, unintended wt loss, pleuritic or exertional cp, hemoptysis,  orthopnea pnd or leg swelling, presyncope, palpitations, heartburn, abdominal pain, anorexia, nausea, vomiting, diarrhea  or change in bowel or urinary habits, change in stools or urine, dysuria,hematuria,  rash, arthralgias, visual complaints, headache, numbness weakness or ataxia or problems with walking or coordination,  change in mood/affect or memory.        Past Medical History:  PVCs.  Syncope  - Holter ordered June 27, 2008  - EP consult June 27, 2008  OSTEOPENIA (ICD-733.90)  COLONIC POLYPS (ICD-211.3) .......................................[name] Kitchen Antonio Carlson - see colonoscopy 11/07/10  DIVERTICULOSIS, MILD (ICD-562.10) ASTHMA (ICD-493.90)  - HFA 90% June 11, 2009  ALLERGIC RHINITIS, CHRONIC (ICD-477.9)........................Antonio Carlson  - steroid dep until mid oct 2009  - Allergy profile sent January 23, 2010 >> IgE 18.3  - Polypectomy Sept 2012 ...................................................... Antonio Carlson  BENIGN PROSTATIC HYPERTROPHY, HX OF (ICD-V13.8) .[name] Kitchen... Antonio Carlson NEPHROLITHIASIS (ICD-592.0)  HYPERLIPIDEMIA (ICD-272.4) target < 130 male, pos fm hx, h/l smoking  R Shoulder pain..................................................................... Antonio Carlson.......................................................[name] KitchenWert  - CPX  08/14/2013  - Pneumovax 11/2004 and @ age 75 June 11, 2009 and Prevnar 08/14/2013  - Td 07/2005  -Med calendar 12/12/2012   Past Surgical History:  Appendectomy  Colonoscopy   Family History:  heart disease in his father at age 37 he  was a smoker  Ca brain half brother siblings healthy but their half mother still living without ascvd  But dementia onset late 23s  Social History:  quit smoking 1971  rarely drink alcohol  Retired               Objective:   Physical Exam  wt 198 May 17, 2008 >  > 192 07/17/11 >  02/05/2012  191> 05/10/2012  188> 08/10/2012 190> 11/14/2012 180>182 12/12/2012 > 03/14/2013 182 > 07/06/2013 185 > 08/14/2013  184  Ambulatory healthy appearing wm in no acute distress  HEENT: nl dentition, turbinates, and orophanx. Nl external ear canals without cough reflex  Neck without JVD/Nodes/TM  Lungs clear to A and P bilaterally without cough on insp or exp maneuvers  RRR no s3 or murmur or increase in P2 / no edema/ nl pulses carotid and feet no bruits Abd soft and benign with nl excursion in the supine position. No  bruits or organomegaly  Ext warm without calf tenderness, cyanosis clubbing or edema  Skin warm and dry without lesions  MS nl gait,  No restrictions GU/ Rectal per Dr Jeffie Pollock Neuro  Nl sensorium, no motor def or path reflexes         CXR  08/14/2013 :   No active cardiopulmonary disease.            Assessment & Plan:

## 2013-08-15 NOTE — Assessment & Plan Note (Signed)
Reminded re use of afrin to help flonase reach the target tissue and should be used x 5 d for any flare of nasal symptoms

## 2013-08-15 NOTE — Assessment & Plan Note (Signed)
Adequate control on present rx, reviewed > no change in rx needed   

## 2013-08-15 NOTE — Assessment & Plan Note (Signed)
All goals of chronic asthma control met including optimal function and elimination of symptoms with minimal need for rescue therapy.  Contingencies discussed in full including contacting this office immediately if not controlling the symptoms using the rule of two's.       Each maintenance medication was reviewed in detail including most importantly the difference between maintenance and as needed and under what circumstances the prns are to be used. This was done in the context of a medication calendar review which provided the patient with a user-friendly unambiguous mechanism for medication administration and reconciliation and provides an action plan for all active problems. It is critical that this be shown to every doctor  for modification during the office visit if necessary so the patient can use it as a working document.

## 2013-08-15 NOTE — Assessment & Plan Note (Signed)
target < 130 male, pos fm hx, h/o smoking   - 08/11/2011 change to 40 mg per day  Lab Results  Component Value Date   CHOL 196 08/14/2013   HDL 49.80 08/14/2013   LDLCALC 127* 08/14/2013   LDLDIRECT 148.5 10/29/2011   TRIG 94.0 08/14/2013   CHOLHDL 4 08/14/2013     Adequate control on present rx, reviewed > no change in rx needed

## 2013-08-15 NOTE — Progress Notes (Signed)
Quick Note:  Spoke with pt and notified of results per Dr. [name]. Pt verbalized understanding and denied any questions.  ______ 

## 2013-08-27 ENCOUNTER — Other Ambulatory Visit: Payer: Self-pay | Admitting: Internal Medicine

## 2013-11-21 ENCOUNTER — Other Ambulatory Visit: Payer: Self-pay | Admitting: Internal Medicine

## 2013-11-21 MED ORDER — SIMVASTATIN 40 MG PO TABS: ORAL_TABLET | ORAL | Status: DC

## 2013-11-23 ENCOUNTER — Other Ambulatory Visit: Payer: Self-pay | Admitting: Internal Medicine

## 2013-11-23 NOTE — Telephone Encounter (Signed)
Rx refill for Simvastatin has already been sent on 11/21/2013. Nothing further needed at this time.

## 2013-12-23 ENCOUNTER — Other Ambulatory Visit: Payer: Self-pay | Admitting: Internal Medicine

## 2014-01-24 ENCOUNTER — Other Ambulatory Visit: Payer: Self-pay | Admitting: Internal Medicine

## 2014-01-31 ENCOUNTER — Other Ambulatory Visit (INDEPENDENT_AMBULATORY_CARE_PROVIDER_SITE_OTHER): Payer: Medicare Other

## 2014-01-31 ENCOUNTER — Ambulatory Visit (INDEPENDENT_AMBULATORY_CARE_PROVIDER_SITE_OTHER): Payer: Medicare Other | Admitting: Internal Medicine

## 2014-01-31 ENCOUNTER — Encounter: Payer: Self-pay | Admitting: Internal Medicine

## 2014-01-31 VITALS — BP 138/70 | HR 55 | Temp 97.7°F | Ht 68.0 in | Wt 176.0 lb

## 2014-01-31 DIAGNOSIS — J45909 Unspecified asthma, uncomplicated: Secondary | ICD-10-CM

## 2014-01-31 DIAGNOSIS — I10 Essential (primary) hypertension: Secondary | ICD-10-CM | POA: Diagnosis not present

## 2014-01-31 DIAGNOSIS — M5459 Other low back pain: Secondary | ICD-10-CM

## 2014-01-31 DIAGNOSIS — M545 Low back pain, unspecified: Secondary | ICD-10-CM

## 2014-01-31 DIAGNOSIS — E785 Hyperlipidemia, unspecified: Secondary | ICD-10-CM | POA: Diagnosis not present

## 2014-01-31 LAB — URINALYSIS
Bilirubin Urine: NEGATIVE
HGB URINE DIPSTICK: NEGATIVE
Ketones, ur: NEGATIVE
Leukocytes, UA: NEGATIVE
Nitrite: NEGATIVE
Specific Gravity, Urine: >=1.03 — AB (ref 1.000–1.030)
TOTAL PROTEIN, URINE-UPE24: NEGATIVE
UROBILINOGEN UA: 0.2 (ref 0.0–1.0)
Urine Glucose: NEGATIVE
pH: 5.5 (ref 5.0–8.0)

## 2014-01-31 MED ORDER — SIMVASTATIN 40 MG PO TABS: ORAL_TABLET | ORAL | Status: DC

## 2014-01-31 MED ORDER — BUDESONIDE-FORMOTEROL FUMARATE 160-4.5 MCG/ACT IN AERO: INHALATION_SPRAY | RESPIRATORY_TRACT | Status: DC

## 2014-01-31 MED ORDER — OMEPRAZOLE 20 MG PO CPDR: DELAYED_RELEASE_CAPSULE | ORAL | Status: DC

## 2014-01-31 MED ORDER — METOPROLOL TARTRATE 50 MG PO TABS: ORAL_TABLET | ORAL | Status: DC

## 2014-01-31 MED ORDER — LORAZEPAM 0.5 MG PO TABS: ORAL_TABLET | ORAL | Status: DC

## 2014-01-31 NOTE — Patient Instructions (Signed)
If not able to afford symbicort, see if dulera is on your drug formulary at a cheaper rate  Please remember to go to the lab  department downstairs for your tests - we will call you with the results when they are available.    Return Jan 2016 for CPX, sooner if needed

## 2014-01-31 NOTE — Progress Notes (Signed)
Subjective:    Patient ID: Antonio Carlson, male    DOB: 1946/04/27   MRN: [id number]  Brief patient profile:  68 yowm quit smoking in 1971 with history of Triad Asthma and hyperlipidemia   HPI 10/01/2011 f/u ov/Antonio Carlson cc Pt c/o chills on and off x 5 days, had night sweats last night. He also c/o weakness in legs and overall feels achy. He has prod cough with minimal clear sputum. Aches all over. On minocin until 24 h pt ov x weeks. Exposed to dead rabbits a week or so ago rec D/c minocin ? Rxn to it Push fluids   10/29/2011 f/u ov/Antonio Carlson cc all better x persistent chronic daytime  cough minimal variable daily min yellowish mucus also draining from nose but better than baseline.   Also here to f/u hbp/hyperlipidemia on zocor 40, no sob or tia or claudication symtpoms. rec Please remember to go to the lab:  lft's ok, ldl 142 > 148   02/05/2012 f/u ov/Antonio Carlson here for f/u lipids/ asthma cc  abrupt onset left hip pain x 2 wks ago, getting better now but wants to have xray. Pain onset while playing corn ball with assoc ecchymosis x sev inches, also resolved. No radicular features or numbness/ weakness leg or foot. rec Check labs > ok with ldl 124 No change rx   05/10/2012 f/u ov/Antonio Carlson cc L hip pain resolved but popping in low back when walk radiates to R not bad enough to even take tylenol, no radicular symptoms or back pain.Did not take any meds prior to today's ov. C/o mild sensation of pnds.  Not using symbicort "unless he needs it" but no saba daytime use at all.  rec Ortho referral   08/10/2012 f/u ov/Antonio Carlson cc comprehensive yearly eval for hbp/ cr/asthma/ gerd doing well with no limiting sob   rec No change    11/14/2012 f/u ov/Antonio Carlson re asthma, ran out of symciort x one week Chief Complaint  Patient presents with  . Follow-up    Increased DOE and cough x 2 wks. Cough is prod with white sputum. He also c/o wheezing with exertion and at night when he lies down.    no nasal flare. Turns out he  ran out of symbicort 160 about the same time his cough and sob started and can't afford this on his present formulary.  Called in prednisone and a little better. Not really understanding how to use saba but while on symbicort rarely needed  any saba at all. >>Pred /Dulera rx    03/14/2013 f/u ov/Antonio Carlson re asthma/ hyperlipidemia / hbp Chief Complaint  Patient presents with  . 3 month follow up    Breathing doing well overall.  SOB and cough have resolved. No wheezing, chest tightness, or chest pain.     tapered symbicort 160 on his own to one puff daily with no increase in sob, cough and no need for saba  07/03/2013 acute  ov/Antonio Carlson re:  Chief Complaint  Patient presents with  . Acute Visit    Pt c/o cough x 4 days- prod with moderate clear sputum. He also c/o rhinitis and left ear "stopped up" since this am.  Did have sore throat but resolved, no rigors or HA or sob over baseline, still just using the symbicort 1 pff in am rec Prednisone 10 mg take  4 each am x 2 days,   2 each am x 2 days,  1 each am x 2 days and stop  For cough take  delsym 2 tsp every 12 hours and pepcid ac 20 mg at bedtime> resolved    08/14/2013 f/u ov/Antonio Carlson re:  Yearly eval: Asthma/ hbp/ hyperlipidemia  Chief Complaint  Patient presents with  . Annual Exam    Pt fasting. He states he is doing well and denies any co's today.   Nasal drainage, watery, has not used the afrin approp No need for saba  No tia, claudication, no sob at all rec No change rx   01/31/2014 acute ov/Antonio Carlson re: back pain/ f/u asthma/hbp/hyperlipidemia Chief Complaint  Patient presents with  . Follow-up    Pt c/o pain in left side- started in March 2015 after had a fall. He states that pain is dull and bothers him when he turns a certain way.   lower L post chest, entirely positional, not pleuritic. No radiation, acute onset p straining. No urinary symptoms  No obvious daytime variabilty or assoc cp or chest tightness, subjective wheeze overt  sinus or hb symptoms. No unusual exp hx or h/o childhood pna/ asthma or knowledge of premature birth.  Sleeping ok without nocturnal  or early am exacerbation  of respiratory  c/o's or need for noct saba. Also denies any obvious fluctuation of symptoms with weather or environmental changes or other aggravating or alleviating factors except as outlined above   Current Medications, Allergies, Past Medical History, Past Surgical History, Family History, and Social History were reviewed in [employer] record.  ROS  The following are not active complaints unless bolded sore throat, dysphagia, dental problems, itching, sneezing,  nasal congestion or excess/ purulent secretions, ear ache,   fever, chills, sweats, unintended wt loss, pleuritic or exertional cp, hemoptysis,  orthopnea pnd or leg swelling, presyncope, palpitations, heartburn, abdominal pain, anorexia, nausea, vomiting, diarrhea  or change in bowel or urinary habits, change in stools or urine, dysuria,hematuria,  rash, arthralgias, visual complaints, headache, numbness weakness or ataxia or problems with walking or coordination,  change in mood/affect or memory.        Past Medical History:  PVCs.  Syncope  - Holter ordered June 27, 2008  - EP consult June 27, 2008  OSTEOPENIA (ICD-733.90)  COLONIC POLYPS (ICD-211.3) .......................................[name] Kitchen [name] - see colonoscopy 11/07/10  DIVERTICULOSIS, MILD (ICD-562.10) ASTHMA (ICD-493.90)  - HFA 90% June 11, 2009  ALLERGIC RHINITIS, CHRONIC (ICD-477.9)........................[name]  - steroid dep until mid oct 2009  - Allergy profile sent January 23, 2010 >> IgE 18.3  - Polypectomy Sept 2012 ...................................................... [name]  BENIGN PROSTATIC HYPERTROPHY, HX OF (ICD-V13.8) .[name] Kitchen... 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 [place].......................................................[name] KitchenWert  - CPX  08/14/2013  - Pneumovax 11/2004 and @ age 89 June 11, 2009 and Prevnar 08/14/2013  - Td 07/2005  -Med calendar 12/12/2012   Past Surgical History:  Appendectomy  Colonoscopy   Family History:  heart disease in his father at age 85 he was a smoker  Ca brain half brother siblings healthy but their half mother still living without ascvd  But dementia onset late 65s  Social History:  quit smoking 1971  rarely drink alcohol  Retired               Objective:   Physical Exam  wt 198 May 17, 2008 >  > 192 07/17/11 >  02/05/2012  191> 05/10/2012  188> 08/10/2012 190> 11/14/2012 180>182 12/12/2012 > 03/14/2013 182 > 07/06/2013 185 > 08/14/2013  184 >  01/31/2014  176  Ambulatory healthy appearing wm in no acute distress  HEENT: nl dentition, turbinates, and orophanx. Nl external ear canals without cough reflex  Neck without JVD/Nodes/TM  Lungs clear to A and P bilaterally without cough on insp or exp maneuvers  RRR no s3 or murmur or increase in P2 / no edema/ nl pulses carotid and feet no bruits Abd soft and benign with nl excursion in the supine position. No bruits or organomegaly  Ext warm without calf tenderness, cyanosis clubbing or edema  Skin warm and dry without lesions  - no rash or ecchymosis or sup tenderness L low back MS nl gait,  No restrictions- neg slr            CXR  08/14/2013 :   No active cardiopulmonary disease.     U/a 01/31/14 completely neg       Assessment & Plan:

## 2014-02-01 DIAGNOSIS — M5459 Other low back pain: Secondary | ICD-10-CM | POA: Insufficient documentation

## 2014-02-01 DIAGNOSIS — M545 Low back pain: Secondary | ICD-10-CM | POA: Insufficient documentation

## 2014-02-01 NOTE — Assessment & Plan Note (Signed)
All goals of chronic asthma control met including optimal function and elimination of symptoms with minimal need for rescue therapy.  Contingencies discussed in full including contacting this office immediately if not controlling the symptoms using the rule of two's.    

## 2014-02-01 NOTE — Assessment & Plan Note (Signed)
U/a completely neg, can't take nsaids > tylenol/ heat

## 2014-02-01 NOTE — Assessment & Plan Note (Signed)
target < 130 male, pos fm hx, h/o smoking   - 08/11/2011 changed to 40 mg per day    Lab Results  Component Value Date   CHOL 196 08/14/2013   HDL 49.80 08/14/2013   LDLCALC 127* 08/14/2013   LDLDIRECT 148.5 10/29/2011   TRIG 94.0 08/14/2013   CHOLHDL 4 08/14/2013    Adequate control on present rx, reviewed > no change in rx needed

## 2014-02-01 NOTE — Assessment & Plan Note (Signed)
Adequate control on present rx, reviewed > no change in rx needed   

## 2014-02-23 ENCOUNTER — Telehealth: Payer: Self-pay | Admitting: Internal Medicine

## 2014-02-23 NOTE — Telephone Encounter (Signed)
lmomtcb x1 

## 2014-02-26 MED ORDER — METOPROLOL TARTRATE 50 MG PO TABS: ORAL_TABLET | ORAL | Status: DC

## 2014-02-26 MED ORDER — SIMVASTATIN 40 MG PO TABS: ORAL_TABLET | ORAL | Status: DC

## 2014-02-26 MED ORDER — FLUTICASONE PROPIONATE 50 MCG/ACT NA SUSP: NASAL | Status: DC

## 2014-02-26 MED ORDER — MONTELUKAST SODIUM 10 MG PO TABS: 10.0000 mg | ORAL_TABLET | Freq: Every evening | ORAL | Status: DC

## 2014-02-26 MED ORDER — BUDESONIDE-FORMOTEROL FUMARATE 160-4.5 MCG/ACT IN AERO: INHALATION_SPRAY | RESPIRATORY_TRACT | Status: DC

## 2014-02-26 NOTE — Telephone Encounter (Signed)
Spoke with pt--verified Rx's to be sent to [place] that Rx's will be sent in  [place] with [employer] to verify fax# given by patient -- verified this is correct Verified correct electronic fax -- [facility] Rx e-scribed to pharmacy Nothing further needed.

## 2014-02-28 ENCOUNTER — Telehealth: Payer: Self-pay | Admitting: Internal Medicine

## 2014-02-28 MED ORDER — BUDESONIDE-FORMOTEROL FUMARATE 160-4.5 MCG/ACT IN AERO: INHALATION_SPRAY | RESPIRATORY_TRACT | Status: DC

## 2014-02-28 NOTE — Telephone Encounter (Signed)
Called spoke with pt. He is aware we do not have samples of Singulair/simvastatin. He voiced his understanding. Pt is asking to change his symbicort 160 RX. Pt reports he does not take symbicort 2 puffs BId. He only take symbicort 1 puff QD. He wants RX sent in for a 6g inhaler d/t this. Please advise MW thanks

## 2014-02-28 NOTE — Telephone Encounter (Signed)
Called made pt aware RX sent into the pharmacy. Nothing further needed

## 2014-02-28 NOTE — Telephone Encounter (Signed)
They may not fill it with that sig but ok if it saves him money to write it whichever way he wants

## 2014-03-02 DIAGNOSIS — H5200 Hypermetropia, unspecified eye: Secondary | ICD-10-CM | POA: Diagnosis not present

## 2014-03-02 DIAGNOSIS — H2510 Age-related nuclear cataract, unspecified eye: Secondary | ICD-10-CM | POA: Diagnosis not present

## 2014-03-19 ENCOUNTER — Telehealth: Payer: Self-pay | Admitting: Internal Medicine

## 2014-03-19 NOTE — Telephone Encounter (Signed)
Will not be effective - this must be a formulary restriction issue so needs ov with [name] to work out, get samples of whatever we have, bring formulary with him

## 2014-03-19 NOTE — Telephone Encounter (Signed)
Called and spoke with pts family and they stated that the pt cannot afford the symbicort so he wanted to see if MW would change this to pulmicort?  MW please advise. Thanks  Allergies  Allergen Reactions  . Aspirin     REACTION: sob  . Amoxicillin     REACTION: gi upset  . Amoxicillin-Pot Clavulanate     REACTION: gi upset  . Sulfonamide Derivatives Other (See Comments)    Doesn't remember    Current Outpatient Prescriptions on File Prior to Visit  Medication Sig Dispense Refill  . acetaminophen (TYLENOL) 500 MG tablet Per bottle as needed for pain      . acyclovir (ZOVIRAX) 5 % ointment Apply as directed for cold sores      . albuterol (PROVENTIL,VENTOLIN) 90 MCG/ACT inhaler Inhale 2 puffs into the lungs every 4 (four) hours as needed for wheezing.       . budesonide-formoterol (SYMBICORT) 160-4.5 MCG/ACT inhaler INHALE 1 puff once daily  18 g  0  . Calcium Carbonate (CALCIUM 500 PO) Take 1 tablet by mouth daily.       . fluticasone (FLONASE) 50 MCG/ACT nasal spray 1 sprays each nostril every morning and 0-1 at bedtime  48 g  1  . LORazepam (ATIVAN) 0.5 MG tablet TAKE ONE TABLET BY MOUTH EVERY 8 HOURS AS NEEDED FOR ANXIETY  60 tablet  2  . metoprolol (LOPRESSOR) 50 MG tablet TAKE ONE-HALF TABLET BY MOUTH TWICE DAILY  90 tablet  1  . montelukast (SINGULAIR) 10 MG tablet Take 1 tablet (10 mg total) by mouth every evening.  90 tablet  1  . Multiple Vitamins-Minerals (CENTRUM) tablet Take 1 tablet by mouth daily.        [name] Kitchen omeprazole (PRILOSEC) 20 MG capsule TAKE ONE CAPSULE BY MOUTH EVERY DAY  90 capsule  3  . oxymetazoline (AFRIN) 0.05 % nasal spray Place 1 spray into the nose 2 (two) times daily as needed (x5 days). congestion      . simvastatin (ZOCOR) 40 MG tablet TAKE ONE TABLET BY MOUTH ONCE DAILY IN THE EVENING.  90 tablet  1  . [DISCONTINUED] rosuvastatin (CRESTOR) 20 MG tablet Take 1 tablet (20 mg total) by mouth at bedtime.  30 tablet  1   No current facility-administered  medications on file prior to visit.

## 2014-03-19 NOTE — Telephone Encounter (Signed)
lmtcb

## 2014-03-20 ENCOUNTER — Telehealth: Payer: Self-pay | Admitting: Internal Medicine

## 2014-03-20 MED ORDER — BUDESONIDE-FORMOTEROL FUMARATE 160-4.5 MCG/ACT IN AERO: INHALATION_SPRAY | RESPIRATORY_TRACT | Status: DC

## 2014-03-20 NOTE — Telephone Encounter (Signed)
Called and spoke with pts wife and she will have the pt call back tomorrow to discuss and possibly make appt with TP to discuss these meds.

## 2014-03-20 NOTE — Telephone Encounter (Signed)
No this is the same as pulmicort, please see previous recs and follow them

## 2014-03-20 NOTE — Telephone Encounter (Signed)
Called and spoke with pt and he stated that he spoke with the pharmacy and they stated to check with MW about the pt using  Budesonide via nebulizer to see if this would work for him.  MW please advise. He stated that if this does not work he will have to come in to see TP.  MW please advise. Thanks  Allergies  Allergen Reactions  . Aspirin     REACTION: sob  . Amoxicillin     REACTION: gi upset  . Amoxicillin-Pot Clavulanate     REACTION: gi upset  . Sulfonamide Derivatives Other (See Comments)    Doesn't remember    Current Outpatient Prescriptions on File Prior to Visit  Medication Sig Dispense Refill  . acetaminophen (TYLENOL) 500 MG tablet Per bottle as needed for pain      . acyclovir (ZOVIRAX) 5 % ointment Apply as directed for cold sores      . albuterol (PROVENTIL,VENTOLIN) 90 MCG/ACT inhaler Inhale 2 puffs into the lungs every 4 (four) hours as needed for wheezing.       . budesonide-formoterol (SYMBICORT) 160-4.5 MCG/ACT inhaler INHALE 1 puff once daily  1 Inhaler  0  . Calcium Carbonate (CALCIUM 500 PO) Take 1 tablet by mouth daily.       . fluticasone (FLONASE) 50 MCG/ACT nasal spray 1 sprays each nostril every morning and 0-1 at bedtime  48 g  1  . LORazepam (ATIVAN) 0.5 MG tablet TAKE ONE TABLET BY MOUTH EVERY 8 HOURS AS NEEDED FOR ANXIETY  60 tablet  2  . metoprolol (LOPRESSOR) 50 MG tablet TAKE ONE-HALF TABLET BY MOUTH TWICE DAILY  90 tablet  1  . montelukast (SINGULAIR) 10 MG tablet Take 1 tablet (10 mg total) by mouth every evening.  90 tablet  1  . Multiple Vitamins-Minerals (CENTRUM) tablet Take 1 tablet by mouth daily.        [name] Kitchen omeprazole (PRILOSEC) 20 MG capsule TAKE ONE CAPSULE BY MOUTH EVERY DAY  90 capsule  3  . oxymetazoline (AFRIN) 0.05 % nasal spray Place 1 spray into the nose 2 (two) times daily as needed (x5 days). congestion      . simvastatin (ZOCOR) 40 MG tablet TAKE ONE TABLET BY MOUTH ONCE DAILY IN THE EVENING.  90 tablet  1  . [DISCONTINUED]  rosuvastatin (CRESTOR) 20 MG tablet Take 1 tablet (20 mg total) by mouth at bedtime.  30 tablet  1   No current facility-administered medications on file prior to visit.

## 2014-03-20 NOTE — Telephone Encounter (Signed)
Called, spoke with pt - Explained below to him.  He verbalized understanding. Pt is requesting to call insurance co prior to scheduling appt with TP. Once he speaks with insurance co, pt will call office back to schedule OV with TP.  In the meantime, pt states he only has 1-2 days of symbicort left.  I left 1 symbicort 160 sample at the front for pick up -- pt aware.

## 2014-03-21 NOTE — Telephone Encounter (Signed)
Called, spoke with pt -  He called this morning and scheduled appt with TP for tomorrow.  Pt confirmed appt date, time, and location.  He is aware to bring formulary with him to OV and bring all medication bottles.  He verbalized understanding and voiced no further questions or concerns at this time.

## 2014-03-22 ENCOUNTER — Ambulatory Visit (INDEPENDENT_AMBULATORY_CARE_PROVIDER_SITE_OTHER): Payer: Medicare Other | Admitting: Adult Health

## 2014-03-22 ENCOUNTER — Encounter: Payer: Self-pay | Admitting: Adult Health

## 2014-03-22 VITALS — BP 132/66 | HR 61 | Temp 97.7°F | Ht 68.0 in | Wt 176.4 lb

## 2014-03-22 DIAGNOSIS — J45909 Unspecified asthma, uncomplicated: Secondary | ICD-10-CM | POA: Diagnosis not present

## 2014-03-22 MED ORDER — FORMOTEROL FUMARATE 20 MCG/2ML IN NEBU: 20.0000 ug | INHALATION_SOLUTION | Freq: Two times a day (BID) | RESPIRATORY_TRACT | Status: DC

## 2014-03-22 MED ORDER — BUDESONIDE 0.25 MG/2ML IN SUSP: 0.2500 mg | Freq: Two times a day (BID) | RESPIRATORY_TRACT | Status: DC

## 2014-03-22 NOTE — Patient Instructions (Signed)
Change to Budesonide and Perforomist Neb Twice daily  (this will replace Symbicort)  Follow med calendar closely and bring to each visit.  Continue on current regimen .  Follow up Dr. Melvyn Novas  In 4 months -in January for Physical.

## 2014-03-22 NOTE — Progress Notes (Signed)
Subjective:    Patient ID: Antonio Carlson, male    DOB: 1946/07/19   MRN: [id number]  Brief patient profile:  68 yowm quit smoking in 1971 with history of [facility] and hyperlipidemia   HPI 10/01/2011 f/u ov/[name] cc Pt c/o chills on and off x 70 days, had night sweats last night. He also c/o weakness in legs and overall feels achy. He has prod cough with minimal clear sputum. Aches all over. On minocin until 24 h pt ov x weeks. Exposed to dead rabbits a week or so ago rec D/c minocin ? Rxn to it Push fluids   10/29/2011 f/u ov/[name] cc all better x persistent chronic daytime  cough minimal variable daily min yellowish mucus also draining from nose but better than baseline.   Also here to f/u hbp/hyperlipidemia on zocor 40, no sob or tia or claudication symtpoms. rec Please remember to go to the lab:  lft's ok, ldl 142 > 148   02/05/2012 f/u ov/[name] here for f/u lipids/ asthma cc  abrupt onset left hip pain x 2 wks ago, getting better now but wants to have xray. Pain onset while playing corn ball with assoc ecchymosis x sev inches, also resolved. No radicular features or numbness/ weakness leg or foot. rec Check labs > ok with ldl 124 No change rx   05/10/2012 f/u ov/[name] cc L hip pain resolved but popping in low back when walk radiates to R not bad enough to even take tylenol, no radicular symptoms or back pain.Did not take any meds prior to today's ov. C/o mild sensation of pnds.  Not using symbicort "unless he needs it" but no saba daytime use at all.  rec Ortho referral   08/10/2012 f/u ov/[name] cc comprehensive yearly eval for hbp/ cr/asthma/ gerd doing well with no limiting sob   rec No change    11/14/2012 f/u ov/[name] re asthma, ran out of symciort x one week Chief Complaint  Patient presents with  . Follow-up    Increased DOE and cough x 2 wks. Cough is prod with white sputum. He also c/o wheezing with exertion and at night when he lies down.    no nasal flare. Turns out he  ran out of symbicort 160 about the same time his cough and sob started and can't afford this on his present formulary.  Called in prednisone and a little better. Not really understanding how to use saba but while on symbicort rarely needed  any saba at all. >>Pred /Dulera rx    03/14/2013 f/u ov/[name] re asthma/ hyperlipidemia / hbp Chief Complaint  Patient presents with  . 3 month follow up    Breathing doing well overall.  SOB and cough have resolved. No wheezing, chest tightness, or chest pain.     tapered symbicort 160 on his own to one puff daily with no increase in sob, cough and no need for saba  07/03/2013 acute  ov/[name] re:  Chief Complaint  Patient presents with  . Acute Visit    Pt c/o cough x 4 days- prod with moderate clear sputum. He also c/o rhinitis and left ear "stopped up" since this am.  Did have sore throat but resolved, no rigors or HA or sob over baseline, still just using the symbicort 1 pff in am rec Prednisone 10 mg take  4 each am x 2 days,   2 each am x 2 days,  1 each am x 2 days and stop  For cough take  delsym 2 tsp every 12 hours and pepcid ac 20 mg at bedtime> resolved    08/14/2013 f/u ov/[name] re:  Yearly eval: Asthma/ hbp/ hyperlipidemia  Chief Complaint  Patient presents with  . Annual Exam    Pt fasting. He states he is doing well and denies any co's today.   Nasal drainage, watery, has not used the afrin approp No need for saba  No tia, claudication, no sob at all rec No change rx   01/31/2014 acute ov/[name] re: back pain/ f/u asthma/hbp/hyperlipidemia Chief Complaint  Patient presents with  . Follow-up    Pt c/o pain in left side- started in March 2015 after had a fall. He states that pain is dull and bothers him when he turns a certain way.   lower L post chest, entirely positional, not pleuritic. No radiation, acute onset p straining. No urinary symptoms >no changes   03/22/2014 Follow up and Med Review  Returns for a one-month followup for  [facility] and med review. We reviewed all his medications and organized them into a medication calendar with patient education. Patient is unable to afford his Symbicort, as it is a high tear copayment under his insurance. He requests to switch to a nebulizer form. We discussed changing Symbicort to budesonide and Perforomist.  He has discussed this with his insurance company and is planning on using the  part B to his pharmacy instead of going through a DME company. He also requested. Using his wife's oxygen concentrator as a nebulizer machine, however, it. We explained that this did not provide enough pressure to deliver the nebulizer. Medications. We reluctantly. Patient agreed for a nebulizer machine ordered to be sent. Patient denies any chest pain, orthopnea, PND, leg swelling, nausea, vomiting, or diarrhea.  Current Medications, Allergies, Past Medical History, Past Surgical History, Family History, and Social History were reviewed in [employer] record.  ROS  The following are not active complaints unless bolded sore throat, dysphagia, dental problems, itching, sneezing,  nasal congestion or excess/ purulent secretions, ear ache,   fever, chills, sweats, unintended wt loss, pleuritic or exertional cp, hemoptysis,  orthopnea pnd or leg swelling, presyncope, palpitations, heartburn, abdominal pain, anorexia, nausea, vomiting, diarrhea  or change in bowel or urinary habits, change in stools or urine, dysuria,hematuria,  rash, arthralgias, visual complaints, headache, numbness weakness or ataxia or problems with walking or coordination,  change in mood/affect or memory.        Past Medical History:  PVCs.  Syncope  - Holter ordered June 27, 2008  - EP consult June 27, 2008  OSTEOPENIA (ICD-733.90)  COLONIC POLYPS (ICD-211.3) .......................................[name] Kitchen [name] - see colonoscopy 11/07/10  DIVERTICULOSIS, MILD (ICD-562.10) ASTHMA (ICD-493.90)   - HFA 90% June 11, 2009  ALLERGIC RHINITIS, CHRONIC (ICD-477.9)........................[name]  - steroid dep until mid oct 2009  - Allergy profile sent January 23, 2010 >> IgE 18.3  - Polypectomy Sept 2012 ...................................................... [name]  BENIGN PROSTATIC HYPERTROPHY, HX OF (ICD-V13.8) .[name] Kitchen... 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 [place].......................................................[name] KitchenWert  - CPX  08/14/2013  - Pneumovax 11/2004 and @ age 75 June 11, 2009 and Prevnar 08/14/2013  - Td 07/2005  -Med calendar 12/12/2012 , 03/22/2014   Past Surgical History:  Appendectomy  Colonoscopy   Family History:  heart disease in his father at age 53 he was a smoker  Ca brain half brother siblings healthy but their  half mother still living without ascvd  But dementia onset late 87s  Social History:  quit smoking 1971  rarely drink alcohol  Retired               Objective:   Physical Exam  wt 198 May 17, 2008 >  > 192 07/17/11 >  02/05/2012  191> 05/10/2012  188> 08/10/2012 190> 11/14/2012 180>182 12/12/2012 > 03/14/2013 182 > 07/06/2013 185 > 08/14/2013  184 > 01/31/2014  176 >176 .03/22/2014  Ambulatory healthy appearing wm in no acute distress  HEENT: nl dentition, turbinates, and orophanx. Nl external ear canals without cough reflex  Neck without JVD/Nodes/TM  Lungs clear to A and P bilaterally without cough on insp or exp maneuvers  RRR no s3 or murmur or increase in P2 / no edema/ nl pulses carotid and feet no bruits Abd soft and benign with nl excursion in the supine position. No bruits or organomegaly  Ext warm without calf tenderness, cyanosis clubbing or edema  Skin warm and dry without lesions  - no rash or ecchymosis or sup tenderness L low back MS nl  gait,             CXR  08/14/2013 :   No active cardiopulmonary disease.     U/a 01/31/14 completely neg       Assessment & Plan:

## 2014-03-22 NOTE — Assessment & Plan Note (Signed)
Asthma well controlled on his current regimen. However, unable to afford due to his insurance formulary. Patient was switched to budesonide and perforomist neb as an alternative to Symbicort. Neb prescriptions and you neb machine orders were given to patient.  Plan  Change to Budesonide and Perforomist Neb Twice daily  (this will replace Symbicort)  Follow med calendar closely and bring to each visit.  Continue on current regimen .  Follow up Dr. Melvyn Novas  In 4 months -in January for Physical.

## 2014-04-03 ENCOUNTER — Telehealth: Payer: Self-pay | Admitting: Internal Medicine

## 2014-04-03 NOTE — Telephone Encounter (Signed)
Received 7 pages from [facility], sent to Dr. Melvyn Novas. 04/03/14/ss.

## 2014-05-08 DIAGNOSIS — Z23 Encounter for immunization: Secondary | ICD-10-CM | POA: Diagnosis not present

## 2014-05-30 ENCOUNTER — Telehealth: Payer: Self-pay | Admitting: Internal Medicine

## 2014-05-30 NOTE — Telephone Encounter (Signed)
If feeling 100% better now then wait to see 11/5, if not should go to [facility] to add on or see [name] If happens again though in meantime go to ER

## 2014-05-30 NOTE — Telephone Encounter (Signed)
Called and spoke to pt. Pt stated he had a syncopal episode at his spouse's doctors appt yesterday, 05/29/14. Pt stated he was sitting in a chair and lost consciousness for about 30 seconds. Pt stated he had a "particualr feeling" right before it happened and new he was going to pass out. The "feeling" happened today, 05/30/2014, when pt was standing and once he laid down the feeling went away and he did not pass out. Pt denies palpitations, pain, dizziness, lethargy, change in vision, hypoglycemia, recent head injury or recent change in medications. Pt stated that when he laid down after he felt strange he took his pressure and HR they were both normal. Last OV with MW (PCP) was on 01/31/14. Pt is requesting appt with MW for 11/5 but MW is booked full.   MW please advise.

## 2014-05-30 NOTE — Telephone Encounter (Signed)
Called spoke with patient who reported that he does feel 100% better now, but is aware to seek emergency attention if his symptoms return over night.  Pt stated that he would prefer to see MW >> appt scheduled w/ MW 11.5.15 at 11am.  Nothing further needed at this time; will sign off.

## 2014-05-31 ENCOUNTER — Ambulatory Visit (INDEPENDENT_AMBULATORY_CARE_PROVIDER_SITE_OTHER): Payer: Medicare Other | Admitting: Internal Medicine

## 2014-05-31 ENCOUNTER — Encounter: Payer: Self-pay | Admitting: Internal Medicine

## 2014-05-31 ENCOUNTER — Other Ambulatory Visit (INDEPENDENT_AMBULATORY_CARE_PROVIDER_SITE_OTHER): Payer: Medicare Other

## 2014-05-31 VITALS — BP 110/80 | HR 63 | Ht 68.0 in | Wt 181.4 lb

## 2014-05-31 DIAGNOSIS — J339 Nasal polyp, unspecified: Secondary | ICD-10-CM | POA: Diagnosis not present

## 2014-05-31 DIAGNOSIS — Z886 Allergy status to analgesic agent status: Secondary | ICD-10-CM

## 2014-05-31 DIAGNOSIS — E785 Hyperlipidemia, unspecified: Secondary | ICD-10-CM

## 2014-05-31 DIAGNOSIS — J45909 Unspecified asthma, uncomplicated: Secondary | ICD-10-CM

## 2014-05-31 DIAGNOSIS — R55 Syncope and collapse: Secondary | ICD-10-CM

## 2014-05-31 DIAGNOSIS — I10 Essential (primary) hypertension: Secondary | ICD-10-CM

## 2014-05-31 DIAGNOSIS — N200 Calculus of kidney: Secondary | ICD-10-CM

## 2014-05-31 DIAGNOSIS — J45998 Other asthma: Secondary | ICD-10-CM

## 2014-05-31 LAB — BASIC METABOLIC PANEL
BUN: 16 mg/dL (ref 6–23)
CALCIUM: 9.7 mg/dL (ref 8.4–10.5)
CO2: 24 meq/L (ref 19–32)
CREATININE: 1 mg/dL (ref 0.4–1.5)
Chloride: 102 mEq/L (ref 96–112)
GFR: 83.11 mL/min (ref >60.00)
Glucose, Bld: 93 mg/dL (ref 70–99)
Potassium: 4.2 mEq/L (ref 3.5–5.1)
SODIUM: 140 meq/L (ref 135–145)

## 2014-05-31 LAB — URINALYSIS
Bilirubin Urine: NEGATIVE
Hgb urine dipstick: NEGATIVE
Ketones, ur: NEGATIVE
Leukocytes, UA: NEGATIVE
Nitrite: NEGATIVE
Total Protein, Urine: NEGATIVE
URINE GLUCOSE: NEGATIVE
UROBILINOGEN UA: 0.2 (ref 0.0–1.0)
pH: 5.5 (ref 5.0–8.0)

## 2014-05-31 LAB — CBC WITH DIFFERENTIAL/PLATELET
BASOS PCT: 0.4 % (ref 0.0–3.0)
Basophils Absolute: 0 10*3/uL (ref 0.0–0.1)
EOS PCT: 3.6 % (ref 0.0–5.0)
Eosinophils Absolute: 0.3 10*3/uL (ref 0.0–0.7)
HCT: 47.4 % (ref 39.0–52.0)
HEMOGLOBIN: 15.7 g/dL (ref 13.0–17.0)
LYMPHS PCT: 25.9 % (ref 12.0–46.0)
Lymphs Abs: 2.4 10*3/uL (ref 0.7–4.0)
MCHC: 33.1 g/dL (ref 30.0–36.0)
MCV: 84.5 fl (ref 78.0–100.0)
Monocytes Absolute: 0.8 10*3/uL (ref 0.1–1.0)
Monocytes Relative: 8.5 % (ref 3.0–12.0)
Neutro Abs: 5.7 10*3/uL (ref 1.4–7.7)
Neutrophils Relative %: 61.6 % (ref 43.0–77.0)
Platelets: 294 10*3/uL (ref 150.0–400.0)
RBC: 5.6 Mil/uL (ref 4.22–5.81)
RDW: 14.7 % (ref 11.5–15.5)
WBC: 9.2 10*3/uL (ref 4.0–10.5)

## 2014-05-31 LAB — TSH: TSH: 1.14 u[IU]/mL (ref 0.35–4.50)

## 2014-05-31 NOTE — Progress Notes (Signed)
Subjective:    Patient ID: Antonio Carlson, male    DOB: 02/13/1946   MRN: [id number]  Brief patient profile:  1 yowm quit smoking in 1971 with history of [facility] Ast[facility] and hyperlipidemia   HPI 10/01/2011 f/u ov/Sorina Derrig cc Pt c/o chills on and off x 5 days, had night sweats last night. He also c/o weakness in legs and overall feels achy. He has prod cough with minimal clear sputum. Aches all over. On minocin until 24 h pt ov x weeks. Exposed to dead rabbits a week or so ago rec D/c minocin ? Rxn to it Push fluids   10/29/2011 f/u ov/Andrzej Scully cc all better x persistent chronic daytime  cough minimal variable daily min yellowish mucus also draining from nose but better than baseline.   Also here to f/u hbp/hyperlipidemia on zocor 40, no sob or tia or claudication symtpoms. rec Please remember to go to the lab:  lft's ok, ldl 142 > 148   02/05/2012 f/u ov/Tymika Grilli here for f/u lipids/ asthma cc  abrupt onset left hip pain x 2 wks ago, getting better now but wants to have xray. Pain onset while playing corn ball with assoc ecchymosis x sev inches, also resolved. No radicular features or numbness/ weakness leg or foot. rec Check labs > ok with ldl 124 No change rx   05/10/2012 f/u ov/Javaeh Muscatello cc L hip pain resolved but popping in low back when walk radiates to R not bad enough to even take tylenol, no radicular symptoms or back pain.Did not take any meds prior to today's ov. C/o mild sensation of pnds.  Not using symbicort "unless he needs it" but no saba daytime use at all.  rec Ortho referral   08/10/2012 f/u ov/Rosezetta Balderston cc comprehensive yearly eval for hbp/ cr/asthma/ gerd doing well with no limiting sob   rec No change    11/14/2012 f/u ov/Justine Dines re asthma, ran out of symciort x one week Chief Complaint  Patient presents with  . Follow-up    Increased DOE and cough x 2 wks. Cough is prod with white sputum. He also c/o wheezing with exertion and at night when he lies down.    no nasal flare. Turns out he  ran out of symbicort 160 about the same time his cough and sob started and can't afford this on his present formulary.  Called in prednisone and a little better. Not really understanding how to use saba but while on symbicort rarely needed  any saba at all. >>Pred /Dulera rx    03/14/2013 f/u ov/Marques Ericson re asthma/ hyperlipidemia / hbp Chief Complaint  Patient presents with  . 3 month follow up    Breathing doing well overall.  SOB and cough have resolved. No wheezing, chest tightness, or chest pain.     tapered symbicort 160 on his own to one puff daily with no increase in sob, cough and no need for saba  07/03/2013 acute  ov/Amberly Livas re:  Chief Complaint  Patient presents with  . Acute Visit    Pt c/o cough x 4 days- prod with moderate clear sputum. He also c/o rhinitis and left ear "stopped up" since this am.  Did have sore throat but resolved, no rigors or HA or sob over baseline, still just using the symbicort 1 pff in am rec Prednisone 10 mg take  4 each am x 2 days,   2 each am x 2 days,  1 each am x 2 days and stop  For cough take  delsym 2 tsp every 12 hours and pepcid ac 20 mg at bedtime> resolved    08/14/2013 f/u ov/Lindie Roberson re:  Yearly eval: Asthma/ hbp/ hyperlipidemia  Chief Complaint  Patient presents with  . Annual Exam    Pt fasting. He states he is doing well and denies any co's today.   Nasal drainage, watery, has not used the afrin approp No need for saba  No tia, claudication, no sob at all rec No change rx   01/31/2014 acute ov/Shaleen Talamantez re: back pain/ f/u asthma/hbp/hyperlipidemia Chief Complaint  Patient presents with  . Follow-up    Pt c/o pain in left side- started in March 2015 after had a fall. He states that pain is dull and bothers him when he turns a certain way.   lower L post chest, entirely positional, not pleuritic. No radiation, acute onset p straining. No urinary symptoms >no changes   03/22/2014 Follow up and Med Review  Returns for a one-month followup for  [facility] and med review. We reviewed all his medications and organized them into a medication calendar with patient education. Patient is unable to afford his Symbicort, as it is a high tear copayment under his insurance. He requests to switch to a nebulizer form. We discussed changing Symbicort to budesonide and Perforomist.  He has discussed this with his insurance company and is planning on using the  part B to his pharmacy instead of going through a DME company. He also requested. Using his wife's oxygen concentrator as a nebulizer machine, however, it. We explained that this did not provide enough pressure to deliver the nebulizer. Medications. We reluctantly. Patient agreed for a nebulizer machine ordered to be sent.    05/31/2014 acute  ov/Makenzye Troutman re: syncope/ presyncope Chief Complaint  Patient presents with  . Acute Visit    Pt states has had syncopal episode x 2 in the past 2 days. He states that it starts out as a "chill sensation"- goes away if he lies down.   spell sitting x 2 no incont or post ictal sensation no nausea or cp or vertigo/ ha bp and pulse were fine 3 d prior to OV  When had a spell In a doctors office but had recovered by that time  No reproduction standing but always goes away if lies down  Good ex tol between episodes Seems like the same problem he had with neg w/u in 2009 by Dr Stanford Breed   No obvious day to day or daytime variabilty or assoc chronic cough or cp or chest tightness, subjective wheeze overt sinus or hb symptoms. No unusual exp hx or h/o childhood pna/ asthma or knowledge of premature birth.  Sleeping ok without nocturnal  or early am exacerbation  of respiratory  c/o's or need for noct saba. Also denies any obvious fluctuation of symptoms with weather or environmental changes or other aggravating or alleviating factors except as outlined above   Current Medications, Allergies, Complete Past Medical History, Past Surgical History, Family History, and  Social History were reviewed in [employer] record.  ROS  The following are not active complaints unless bolded sore throat, dysphagia, dental problems, itching, sneezing,  nasal congestion or excess/ purulent secretions, ear ache,   fever, chills, sweats, unintended wt loss, pleuritic or exertional cp, hemoptysis,  orthopnea pnd or leg swelling, presyncope, palpitations, heartburn, abdominal pain, anorexia, nausea, vomiting, diarrhea  or change in bowel or urinary habits, change in stools or urine, dysuria,hematuria,  rash, arthralgias, visual complaints,  headache, numbness weakness or ataxia or problems with walking or coordination,  change in mood/affect or memory.               Past Medical History:  PVCs.  Syncope  - Holter ordered June 27, 2008 > nl  - EP consult June 27, 2008  OSTEOPENIA (ICD-733.90)  COLONIC POLYPS (ICD-211.3) .......................................[name] Kitchen [name] - see colonoscopy 11/07/10  DIVERTICULOSIS, MILD (ICD-562.10) ASTHMA (ICD-493.90)  - HFA 90% June 11, 2009  ALLERGIC RHINITIS, CHRONIC (ICD-477.9)........................[name]  - steroid dep until mid oct 2009  - Allergy profile sent January 23, 2010 >> IgE 18.3  - Polypectomy Sept 2012 ...................................................... [name]  BENIGN PROSTATIC HYPERTROPHY, HX OF (ICD-V13.8) .[name] Kitchen... 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 [place].......................................................[name] KitchenWert  - CPX  08/14/2013  - Pneumovax 11/2004 and @ age 35 June 11, 2009 and Prevnar 08/14/2013  - Td 07/2005  -Med calendar 12/12/2012 , 03/22/2014   Past Surgical History:  Appendectomy  Colonoscopy   Family History:  heart disease in his father at age 74 he was a smoker  Ca brain  half brother siblings healthy but their half mother still living without ascvd  But dementia onset late 86s  Social History:  quit smoking 1971  rarely drink alcohol  Retired               Objective:   Physical Exam  wt 198 May 17, 2008 >  > 192 07/17/11 >  02/05/2012  191> 05/10/2012  188> 08/10/2012 190> 11/14/2012 180>182 12/12/2012 > 03/14/2013 182 > 07/06/2013 185 > 08/14/2013  184 > 01/31/2014  176 >176 .03/22/2014 >  05/31/2014   181   Ambulatory healthy appearing wm in no acute distress / no change in bp standing  HEENT: nl dentition, turbinates, and orophanx. Nl external ear canals without cough reflex  Neck without JVD/Nodes/TM  Lungs clear to A and P bilaterally without cough on insp or exp maneuvers  RRR no s3 or murmur or increase in P2 / no edema/ nl pulses carotid and feet no bruits Abd soft and benign with nl excursion in the supine position. No bruits or organomegaly  Ext warm without calf tenderness, cyanosis clubbing or edema  Skin warm and dry without lesions  - no rash or ecchymosis or sup tenderness L low back MS nl gait,         ekg 05/31/14 nsr/ wnl       Recent Labs Lab 05/31/14 1236  NA 140  K 4.2  CL 102  CO2 24  BUN 16  CREATININE 1.0  GLUCOSE 93    Recent Labs Lab 05/31/14 1236  HGB 15.7  HCT 47.4  WBC 9.2  PLT 294.0     Lab Results  Component Value Date   TSH 1.14 05/31/2014     No results found for: PROBNP   Lab Results  Component Value Date   ESRSEDRATE 10 09/12/2010             Assessment & Plan:

## 2014-05-31 NOTE — Patient Instructions (Addendum)
Please see patient coordinator before you leave today  to schedule holter monitor  Please remember to go to the lab department downstairs for your tests - we will call you with the results when they are available.  If get light headed lie down right away  No driving until no spells x 2 weeks  Please schedule a follow up visit in 3 months but call sooner if needed cpx

## 2014-06-01 NOTE — Progress Notes (Signed)
Quick Note:  LMTCB ______ 

## 2014-06-02 NOTE — Assessment & Plan Note (Signed)
Adequate control on present rx, reviewed > no change in rx needed , no evidence over use of Beta agonists assoc with syncope

## 2014-06-02 NOTE — Assessment & Plan Note (Signed)
Adequate control on present rx, reviewed  Pulse well controlled so no room to change BB dose at this point

## 2014-06-02 NOTE — Assessment & Plan Note (Signed)
U/a neg

## 2014-06-02 NOTE — Assessment & Plan Note (Signed)
target < 130 male, pos fm hx, h/o smoking   - 08/11/2011 changed to 40 mg per day    Lab Results  Component Value Date   CHOL 196 08/14/2013   HDL 49.80 08/14/2013   LDLCALC 127* 08/14/2013   LDLDIRECT 148.5 10/29/2011   TRIG 94.0 08/14/2013   CHOLHDL 4 08/14/2013     Adequate control on present rx, reviewed > no change in rx needed

## 2014-06-02 NOTE — Assessment & Plan Note (Addendum)
W/u 2009 / [place]/ no dx in epic but echo basically nl then  Recurrent 05/28/14  - 06/01/2014  Walked RA @ fast pace x 3 laps @ 185 ft each stopped due to  End of study, no desat or cp/sob  - Holter ordered 05/31/2014 >>>  No orthostasis noted but can eliminate the symptoms by lying down quickly and does get a warning so needs to immediately lie down when feels it coming on.  Advised not to drive until sort this out

## 2014-06-03 ENCOUNTER — Encounter (HOSPITAL_COMMUNITY): Payer: Self-pay | Admitting: *Deleted

## 2014-06-03 ENCOUNTER — Emergency Department (HOSPITAL_COMMUNITY): Payer: Medicare Other

## 2014-06-03 ENCOUNTER — Emergency Department (HOSPITAL_COMMUNITY)
Admission: EM | Admit: 2014-06-03 | Discharge: 2014-06-03 | Disposition: A | Discharge status: Home / Self Care | Payer: Medicare Other | Attending: Emergency Medicine | Admitting: Emergency Medicine

## 2014-06-03 DIAGNOSIS — M79601 Pain in right arm: Secondary | ICD-10-CM | POA: Diagnosis not present

## 2014-06-03 DIAGNOSIS — E785 Hyperlipidemia, unspecified: Secondary | ICD-10-CM | POA: Insufficient documentation

## 2014-06-03 DIAGNOSIS — M4722 Other spondylosis with radiculopathy, cervical region: Secondary | ICD-10-CM | POA: Diagnosis not present

## 2014-06-03 DIAGNOSIS — M25511 Pain in right shoulder: Secondary | ICD-10-CM | POA: Diagnosis not present

## 2014-06-03 DIAGNOSIS — K219 Gastro-esophageal reflux disease without esophagitis: Secondary | ICD-10-CM | POA: Diagnosis not present

## 2014-06-03 DIAGNOSIS — M5412 Radiculopathy, cervical region: Secondary | ICD-10-CM

## 2014-06-03 DIAGNOSIS — Z87448 Personal history of other diseases of urinary system: Secondary | ICD-10-CM | POA: Insufficient documentation

## 2014-06-03 DIAGNOSIS — R55 Syncope and collapse: Secondary | ICD-10-CM | POA: Diagnosis not present

## 2014-06-03 DIAGNOSIS — Z7951 Long term (current) use of inhaled steroids: Secondary | ICD-10-CM | POA: Insufficient documentation

## 2014-06-03 DIAGNOSIS — Z87891 Personal history of nicotine dependence: Secondary | ICD-10-CM | POA: Diagnosis not present

## 2014-06-03 DIAGNOSIS — Z79899 Other long term (current) drug therapy: Secondary | ICD-10-CM | POA: Insufficient documentation

## 2014-06-03 DIAGNOSIS — I10 Essential (primary) hypertension: Secondary | ICD-10-CM | POA: Diagnosis not present

## 2014-06-03 DIAGNOSIS — M542 Cervicalgia: Secondary | ICD-10-CM | POA: Diagnosis not present

## 2014-06-03 DIAGNOSIS — J45909 Unspecified asthma, uncomplicated: Secondary | ICD-10-CM | POA: Diagnosis not present

## 2014-06-03 DIAGNOSIS — R200 Anesthesia of skin: Secondary | ICD-10-CM | POA: Diagnosis not present

## 2014-06-03 DIAGNOSIS — Z880 Allergy status to penicillin: Secondary | ICD-10-CM | POA: Insufficient documentation

## 2014-06-03 DIAGNOSIS — Z889 Allergy status to unspecified drugs, medicaments and biological substances status: Secondary | ICD-10-CM | POA: Insufficient documentation

## 2014-06-03 DIAGNOSIS — R079 Chest pain, unspecified: Secondary | ICD-10-CM | POA: Diagnosis not present

## 2014-06-03 LAB — I-STAT CHEM 8, ED
BUN: 14 mg/dL (ref 6–23)
CREATININE: 0.9 mg/dL (ref 0.50–1.35)
Calcium, Ion: 1.17 mmol/L (ref 1.13–1.30)
Chloride: 101 mEq/L (ref 96–112)
Glucose, Bld: 119 mg/dL — ABNORMAL HIGH (ref 70–99)
HCT: 54 % — ABNORMAL HIGH (ref 39.0–52.0)
Hemoglobin: 18.4 g/dL — ABNORMAL HIGH (ref 13.0–17.0)
POTASSIUM: 3.8 meq/L (ref 3.7–5.3)
SODIUM: 139 meq/L (ref 137–147)
TCO2: 27 mmol/L (ref 0–100)

## 2014-06-03 LAB — CBC WITH DIFFERENTIAL/PLATELET
Basophils Absolute: 0.1 10*3/uL (ref 0.0–0.1)
Basophils Relative: 1 % (ref 0–1)
EOS ABS: 0.4 10*3/uL (ref 0.0–0.7)
Eosinophils Relative: 4 % (ref 0–5)
HCT: 47.9 % (ref 39.0–52.0)
Hemoglobin: 16.5 g/dL (ref 13.0–17.0)
LYMPHS ABS: 2 10*3/uL (ref 0.7–4.0)
Lymphocytes Relative: 19 % (ref 12–46)
MCH: 28.9 pg (ref 26.0–34.0)
MCHC: 34.4 g/dL (ref 30.0–36.0)
MCV: 84 fL (ref 78.0–100.0)
Monocytes Absolute: 0.9 10*3/uL (ref 0.1–1.0)
Monocytes Relative: 9 % (ref 3–12)
NEUTROS PCT: 67 % (ref 43–77)
Neutro Abs: 7 10*3/uL (ref 1.7–7.7)
PLATELETS: 267 10*3/uL (ref 150–400)
RBC: 5.7 MIL/uL (ref 4.22–5.81)
RDW: 13.8 % (ref 11.5–15.5)
WBC: 10.3 10*3/uL (ref 4.0–10.5)

## 2014-06-03 LAB — COMPREHENSIVE METABOLIC PANEL
ALK PHOS: 129 U/L — AB (ref 39–117)
ALT: 29 U/L (ref 0–53)
AST: 25 U/L (ref 0–37)
Albumin: 3.6 g/dL (ref 3.5–5.2)
Anion gap: 13 (ref 5–15)
BUN: 14 mg/dL (ref 6–23)
CO2: 27 mEq/L (ref 19–32)
Calcium: 9.7 mg/dL (ref 8.4–10.5)
Chloride: 99 mEq/L (ref 96–112)
Creatinine, Ser: 0.77 mg/dL (ref 0.50–1.35)
GFR calc non Af Amer: 90 mL/min — ABNORMAL LOW (ref >90)
GLUCOSE: 123 mg/dL — AB (ref 70–99)
Potassium: 4 mEq/L (ref 3.7–5.3)
SODIUM: 139 meq/L (ref 137–147)
TOTAL PROTEIN: 7.3 g/dL (ref 6.0–8.3)
Total Bilirubin: 0.3 mg/dL (ref 0.3–1.2)

## 2014-06-03 LAB — I-STAT TROPONIN, ED: Troponin i, poc: 0.02 ng/mL (ref 0.00–0.08)

## 2014-06-03 LAB — CBG MONITORING, ED: Glucose-Capillary: 118 mg/dL — ABNORMAL HIGH (ref 70–99)

## 2014-06-03 MED ORDER — HYDROMORPHONE HCL 1 MG/ML IJ SOLN
0.5000 mg | Freq: Once | INTRAMUSCULAR | Status: DC
  Filled 2014-06-03: qty 1

## 2014-06-03 MED ORDER — OXYCODONE-ACETAMINOPHEN 5-325 MG PO TABS
1.0000 | ORAL_TABLET | Freq: Once | ORAL | Status: AC
  Administered 2014-06-03: 1 via ORAL
  Filled 2014-06-03: qty 1

## 2014-06-03 MED ORDER — IOHEXOL 350 MG/ML SOLN
50.0000 mL | Freq: Once | INTRAVENOUS | Status: AC | PRN
  Administered 2014-06-03: 50 mL via INTRAVENOUS

## 2014-06-03 MED ORDER — OXYCODONE-ACETAMINOPHEN 5-325 MG PO TABS: 1.0000 | ORAL_TABLET | Freq: Four times a day (QID) | ORAL | Status: DC | PRN

## 2014-06-03 NOTE — ED Notes (Addendum)
Pt complains of right shoulder blade pain that radiates all into neck and right arm, right hand fingers go numb. Pt denies chest pain, SOB, nausea/vomiting, dizziness, etc at this time. Pt wife does state syncopal episodes in the past week. Pt denies pain at this time. Pt denies trauma to the arm and denies strain to the arm/shoulder. Pt states "it feels like a pulled muscle but i haven't done anything to pull it." Pt has seen primary care dr and is scheduled to wear a holter monitor next Tuesday. Pt states when he turns his neck to the right or looks up the pain comes on in his shoulder.

## 2014-06-03 NOTE — Discharge Instructions (Signed)
1. Come back if you lose sensation or strength to your arm 2. Come back if you pass out without warning or symptoms 3. See your PCP in 1 -2 weeks for possible MRI of your neck 4. Come back if worsening symptoms Cervical Radiculopathy Cervical radiculopathy means a nerve in the neck is pinched or bruised. This can cause pain or loss of feeling (numbness) that runs from your neck to your arm and fingers. HOME CARE   Put ice on the injured or painful area.  Put ice in a plastic bag.  Place a towel between your skin and the bag.  Leave the ice on for 15-20 minutes, 03-04 times a day, or as told by your doctor.  If ice does not help, you can try using heat. Take a warm shower or bath, or use a hot water bottle as told by your doctor.  You may try a gentle neck and shoulder massage.  Use a flat pillow when you sleep.  Only take medicines as told by your doctor.  Keep all physical therapy visits as told by your doctor.  If you are given a soft collar, wear it as told by your doctor. GET HELP RIGHT AWAY IF:   Your pain gets worse and is not controlled with medicine.  You lose feeling or feel weak in your hand, arm, face, or leg.  You have a fever or stiff neck.  You cannot control when you poop or pee (incontinence).  You have trouble with walking, balance, or speaking. MAKE SURE YOU:   Understand these instructions.  Will watch your condition.  Will get help right away if you are not doing well or get worse. Document Released: 07/02/2011 Document Revised: 10/05/2011 Document Reviewed: 07/02/2011 [facility] Patient Information 2015 [place], [place]. This information is not intended to replace advice given to you by your health care provider. Make sure you discuss any questions you have with your health care provider.

## 2014-06-03 NOTE — ED Notes (Signed)
Pt reports right side back pain, pain in his neck and shoulder blade, radiates down right arm and has numbness to right fingers. ekg done at triage, no acute distress noted.

## 2014-06-03 NOTE — ED Provider Notes (Signed)
CSN: [id number]     Arrival date & time 06/03/14  0947 History   First MD Initiated Contact with Patient 06/03/14 0813     Chief Complaint  Patient presents with  . Arm Pain  . Back Pain     (Consider location/radiation/quality/duration/timing/severity/associated sxs/prior Treatment) Patient is a 68 y.o. male presenting with shoulder pain. The history is provided by the patient. No language interpreter was used.  Shoulder Pain Location:  Shoulder and arm Time since incident:  2 days Injury: no   Shoulder location:  R shoulder Arm location:  R arm Pain details:    Quality:  Aching   Radiates to:  R arm   Severity:  Moderate   Onset quality:  Gradual   Duration:  2 days   Timing:  Constant   Progression:  Worsening Chronicity:  New Handedness:  Right-handed Dislocation: no   Foreign body present:  No foreign bodies Tetanus status:  Unknown Prior injury to area:  Yes Relieved by:  Rest Worsened by:  Movement Ineffective treatments:  NSAIDs Associated symptoms: numbness (4th and 5th digit)   Associated symptoms: no back pain, no decreased range of motion and no fever     Past Medical History  Diagnosis Date  . PVC's (premature ventricular contractions)   . Syncope   . Asthma   . Allergy   . Benign prostatic hypertrophy   . Diverticulosis   . Nephrolithiasis   . Hyperlipidemia   . Hypertension   . GERD (gastroesophageal reflux disease)    Past Surgical History  Procedure Laterality Date  . Appendectomy    . Nasal sinus surgery  Sept 2012   History reviewed. No pertinent family history. History  Substance Use Topics  . Smoking status: Former Smoker -- 2.00 packs/day for 14 years    Quit date: 07/27/1969  . Smokeless tobacco: Never Used  . Alcohol Use: No    Review of Systems  Constitutional: Negative for fever.  HENT: Negative for congestion, rhinorrhea and sore throat.   Respiratory: Negative for cough and shortness of breath.   Cardiovascular:  Negative for chest pain.  Gastrointestinal: Negative for nausea, vomiting, abdominal pain and diarrhea.  Genitourinary: Negative for dysuria and hematuria.  Musculoskeletal: Positive for myalgias (right shoulder) and arthralgias (right shoulder). Negative for back pain.  Skin: Negative for rash.  Neurological: Negative for syncope, light-headedness and headaches.  All other systems reviewed and are negative.     Allergies  Aspirin; Amoxicillin; Amoxicillin-pot clavulanate; and Sulfonamide derivatives  Home Medications   Prior to Admission medications   Medication Sig Start Date End Date Taking? Authorizing Provider  acetaminophen (TYLENOL) 500 MG tablet Per bottle as needed for pain    Historical Provider, MD  acyclovir (ZOVIRAX) 5 % ointment Apply as directed for cold sores    Historical Provider, MD  albuterol (PROVENTIL,VENTOLIN) 90 MCG/ACT inhaler Inhale 2 puffs into the lungs every 4 (four) hours as needed for wheezing.     Historical Provider, MD  budesonide (PULMICORT) 0.25 MG/2ML nebulizer solution Take 2 mLs (0.25 mg total) by nebulization 2 (two) times daily. FILE UNDER PART B 03/22/14   Tammy S Parrett, NP  Calcium Carbonate (CALCIUM 500 PO) Take 1 tablet by mouth daily.     Historical Provider, MD  fluticasone (FLONASE) 50 MCG/ACT nasal spray 1 sprays each nostril every morning and 0-1 at bedtime 02/26/14   [name], MD  formoterol (PERFOROMIST) 20 MCG/2ML nebulizer solution Take 2 mLs (20 mcg total) by nebulization  2 (two) times daily. FILE UNDER PART B 03/22/14   Tammy S Parrett, NP  Lactobacillus (DIGESTIVE HEALTH PROBIOTIC) CAPS Take 1 capsule by mouth daily.    Historical Provider, MD  LORazepam (ATIVAN) 0.5 MG tablet TAKE ONE TABLET BY MOUTH EVERY 8 HOURS AS NEEDED FOR ANXIETY 01/31/14   [name], MD  metoprolol (LOPRESSOR) 50 MG tablet TAKE ONE-HALF TABLET BY MOUTH TWICE DAILY 02/26/14   [name], MD  montelukast (SINGULAIR) 10 MG tablet Take 1 tablet (10 mg  total) by mouth every evening. 02/26/14   [name], MD  Multiple Vitamins-Minerals (CENTRUM) tablet Take 1 tablet by mouth daily.      Historical Provider, MD  omeprazole (PRILOSEC) 20 MG capsule TAKE ONE CAPSULE BY MOUTH EVERY DAY 01/31/14   [name], MD  oxymetazoline (AFRIN) 0.05 % nasal spray Place 1 spray into the nose 2 (two) times daily as needed (x5 days). congestion    Historical Provider, MD  simvastatin (ZOCOR) 40 MG tablet TAKE ONE TABLET BY MOUTH ONCE DAILY IN THE EVENING. 02/26/14   [name], MD   BP 154/98 mmHg  Pulse 76  Temp(Src) 97.7 F (36.5 C) (Oral)  Resp 14  SpO2 99% Physical Exam  Constitutional: He is oriented to person, place, and time. He appears well-developed and well-nourished.  HENT:  Head: Normocephalic and atraumatic.  Right Ear: External ear normal.  Left Ear: External ear normal.  Eyes: EOM are normal.  Neck: Normal range of motion. Neck supple.  Cardiovascular: Normal rate, regular rhythm and intact distal pulses.  Exam reveals no gallop and no friction rub.   No murmur heard. Pulmonary/Chest: Effort normal and breath sounds normal. No respiratory distress. He has no wheezes. He has no rales. He exhibits no tenderness.  Abdominal: Soft. Bowel sounds are normal. He exhibits no distension. There is no tenderness. There is no rebound.  Musculoskeletal: Normal range of motion. He exhibits no edema or tenderness.  Right Upper Extremity INSPECTION: No open wounds. PALPATION: Tender to palpation over trapezius. ROM: Good to normal AROM and PROM in shoulder, elbow and wrist joint. VASCULAR: Extremity warm and well-perfused. SENSORY: sensation is intact to light touch in superficial radial (dorsal first web space), median (tip of index finger), ulnar (tip of small finger) nerve distributions. MOTOR:+motor posterior interosseous nerve (thumb IP extension), anterior interosseous nerve (thumb IP flexion, index finger DIP flexion), radial nerve (wrist  extension), median nerve (palpable firing thenar mass), ulnar nerve (palpable firing of first dorsal interosseous muscle).   Lymphadenopathy:    He has no cervical adenopathy.  Neurological: He is alert and oriented to person, place, and time.  Neurologic exam: CN I-XII: grossly intact, Sensation: normal in upper and lower extremities, Strength 5/5 in both upper and lower extremities, Coordination intact. Gait normal.  Skin: Skin is warm. No rash noted.  Psychiatric: He has a normal mood and affect. His behavior is normal.  Nursing note and vitals reviewed.   ED Course  Procedures (including critical care time) Labs Review Labs Reviewed  COMPREHENSIVE METABOLIC PANEL - Abnormal; Notable for the following:    Glucose, Bld 123 (*)    Alkaline Phosphatase 129 (*)    GFR calc non Af Amer 90 (*)    All other components within normal limits  I-STAT CHEM 8, ED - Abnormal; Notable for the following:    Glucose, Bld 119 (*)    Hemoglobin 18.4 (*)    HCT 54.0 (*)  All other components within normal limits  CBG MONITORING, ED - Abnormal; Notable for the following:    Glucose-Capillary 118 (*)    All other components within normal limits  CBC WITH DIFFERENTIAL  POCT CBG (FASTING - GLUCOSE)-MANUAL ENTRY  I-STAT TROPOININ, ED    Imaging Review Ct Angio Head W/cm &/or Wo Cm  06/03/2014   CLINICAL DATA:  Neck at pain extending into the right arm. Right hand numbness. Syncopal episodes during the last week. Pain when turning to the right or looking up.  EXAM: CT ANGIOGRAPHY HEAD AND NECK  TECHNIQUE: Multidetector CT imaging of the head and neck was performed using the standard protocol during bolus administration of intravenous contrast. Multiplanar CT image reconstructions and MIPs were obtained to evaluate the vascular anatomy. Carotid stenosis measurements (when applicable) are obtained utilizing NASCET criteria, using the distal internal carotid diameter as the denominator.  CONTRAST:  37m  OMNIPAQUE IOHEXOL 350 MG/ML SOLN  COMPARISON:  Sinus CT 03/02/2011  FINDINGS: CTA HEAD FINDINGS  Mild atrophy and white matter disease is present. No acute cortical infarct, hemorrhage, or mass lesion is present. The ventricles are proportionate to the degree of atrophy. No significant extraaxial fluid collection is present.  Patient is status post bilateral maxillary antrostomies. Bilateral ethmoidectomies have been performed. Soft tissue fills the residual anterior ethmoid cavities and frontal sinuses bilaterally. Circumferential mucosal thickening is noted in the sphenoid sinuses bilaterally. The mastoid air cells are clear.  Atherosclerotic calcifications are present within the ophthalmic segment of the right internal carotid artery without significant stenosis. The internal carotid arteries are otherwise within normal limits to the ICA termini.  The A1 and M1 segments are normal. The anterior communicating artery is patent. The MCA bifurcations are intact. There is asymmetric attenuation of left MCA branch vessels compared to the right. No central stenotic lesion is evident. The ACA and MCA branch vessels are otherwise within normal limits.  The vertebral arteries are codominant. The PICA origins are visualized and normal. The basilar artery is within normal limits. Both posterior cerebral arteries originate from the basilar tip. The PCA branch vessels are within normal limits.  Review of the MIP images confirms the above findings.  CTA NECK FINDINGS  A 3 vessel arch configuration is present. The vertebral arteries arise from the subclavian arteries. There is mild tortuosity without significant stenosis. The vertebral arteries are within normal limits throughout the neck. New  The right common carotid artery is within normal limits. The bifurcation is unremarkable. Mild tortuosity is present in the cervical right ICA without significant stenosis.  The left common carotid artery is mildly tortuous. The  bifurcation is intact. Minimal calcification is present without significant stenosis. There is mild tortuosity of cervical right ICA without significant stenosis.  Soft tissues of the neck are within normal limits. Lung apices are clear.  Advanced facet degenerative changes are present in the upper cervical spine on the left. Facet degenerative changes are worse in the lower lumbar spine on the right. Osseous foraminal narrowing is most prominent on the right at C5-6 and C6-7 and on the left at C3-4 and C4-5.  Review of the MIP images confirms the above findings.  IMPRESSION: 1. No significant stenosis within the cervical vasculature. 2. No focal stenosis or discrete tortuosity in the vertebral arteries to suggest kinking with motion. 3. Minimal atherosclerotic change. 4. Asymmetric attenuation of left MCA branch vessels without a proximal stenotic lesion. 5. Mild atrophy and white matter disease.   Electronically Signed  By: Lawrence Santiago M.D.   On: 06/03/2014 10:57   Dg Chest 2 View  06/03/2014   CLINICAL DATA:  68 year old male with right upper chest, shoulder and neck pain radiating into the right arm.  EXAM: CHEST  2 VIEW  COMPARISON:  Prior chest x-ray 08/14/2013  FINDINGS: The lungs are clear and negative for focal airspace consolidation, pulmonary edema or suspicious pulmonary nodule. Prominent nipple shadows. No pleural effusion or pneumothorax. Cardiac and mediastinal contours are within normal limits. Trace aortic atherosclerosis. No acute fracture or lytic or blastic osseous lesions. The visualized upper abdominal bowel gas pattern is unremarkable.  IMPRESSION: No active cardiopulmonary disease.  Mild aortic atherosclerosis.   Electronically Signed   By: Jacqulynn Cadet M.D.   On: 06/03/2014 09:31   Ct Angio Neck W/cm &/or Wo/cm  06/03/2014   CLINICAL DATA:  Neck at pain extending into the right arm. Right hand numbness. Syncopal episodes during the last week. Pain when turning to the right  or looking up.  EXAM: CT ANGIOGRAPHY HEAD AND NECK  TECHNIQUE: Multidetector CT imaging of the head and neck was performed using the standard protocol during bolus administration of intravenous contrast. Multiplanar CT image reconstructions and MIPs were obtained to evaluate the vascular anatomy. Carotid stenosis measurements (when applicable) are obtained utilizing NASCET criteria, using the distal internal carotid diameter as the denominator.  CONTRAST:  68m OMNIPAQUE IOHEXOL 350 MG/ML SOLN  COMPARISON:  Sinus CT 03/02/2011  FINDINGS: CTA HEAD FINDINGS  Mild atrophy and white matter disease is present. No acute cortical infarct, hemorrhage, or mass lesion is present. The ventricles are proportionate to the degree of atrophy. No significant extraaxial fluid collection is present.  Patient is status post bilateral maxillary antrostomies. Bilateral ethmoidectomies have been performed. Soft tissue fills the residual anterior ethmoid cavities and frontal sinuses bilaterally. Circumferential mucosal thickening is noted in the sphenoid sinuses bilaterally. The mastoid air cells are clear.  Atherosclerotic calcifications are present within the ophthalmic segment of the right internal carotid artery without significant stenosis. The internal carotid arteries are otherwise within normal limits to the ICA termini.  The A1 and M1 segments are normal. The anterior communicating artery is patent. The MCA bifurcations are intact. There is asymmetric attenuation of left MCA branch vessels compared to the right. No central stenotic lesion is evident. The ACA and MCA branch vessels are otherwise within normal limits.  The vertebral arteries are codominant. The PICA origins are visualized and normal. The basilar artery is within normal limits. Both posterior cerebral arteries originate from the basilar tip. The PCA branch vessels are within normal limits.  Review of the MIP images confirms the above findings.  CTA NECK FINDINGS  A  3 vessel arch configuration is present. The vertebral arteries arise from the subclavian arteries. There is mild tortuosity without significant stenosis. The vertebral arteries are within normal limits throughout the neck. New  The right common carotid artery is within normal limits. The bifurcation is unremarkable. Mild tortuosity is present in the cervical right ICA without significant stenosis.  The left common carotid artery is mildly tortuous. The bifurcation is intact. Minimal calcification is present without significant stenosis. There is mild tortuosity of cervical right ICA without significant stenosis.  Soft tissues of the neck are within normal limits. Lung apices are clear.  Advanced facet degenerative changes are present in the upper cervical spine on the left. Facet degenerative changes are worse in the lower lumbar spine on the right. Osseous foraminal narrowing is  most prominent on the right at C5-6 and C6-7 and on the left at C3-4 and C4-5.  Review of the MIP images confirms the above findings.  IMPRESSION: 1. No significant stenosis within the cervical vasculature. 2. No focal stenosis or discrete tortuosity in the vertebral arteries to suggest kinking with motion. 3. Minimal atherosclerotic change. 4. Asymmetric attenuation of left MCA branch vessels without a proximal stenotic lesion. 5. Mild atrophy and white matter disease.   Electronically Signed   By: Lawrence Santiago M.D.   On: 06/03/2014 10:57     EKG Interpretation   Date/Time:  Sunday June 03 2014 08:03:20 EST Ventricular Rate:  79 PR Interval:  148 QRS Duration: 82 QT Interval:  368 QTC Calculation: 421 R Axis:   73 Text Interpretation:  Normal sinus rhythm with sinus arrhythmia Normal ECG  No significant change since last tracing Confirmed by HARRISON  MD,  FORREST ([id number]) on 06/03/2014 8:20:33 AM      MDM   Final diagnoses:  Neck pain  Syncope, unspecified syncope type  Right shoulder pain  Cervical  radiculopathy    8:14 AM Pt is a 68 y.o. male with pertinent PMHX of asthma, HLD, HTN, GERD who presents to the ED with right shoulder pain gradually worsening sharp pain radiating into right arm. Numbness of the 4th and 5th digit last night. No trauma or injury. No chest pain or shortness of breath. Has had syncopal episodes last week. Seen by PCP for syncope: scheduled for holter monitor. Endorses pain that radiates to neck worse with turning to right. No synocpe with turning neck. Episode of syncope a few days ago, none since.   On exam: no focal tenderness. Pain improved with shoulder abduction concerning for possible cervical radiculopathy, however given syncope concern for possible vertebral artery dissection/abnormalities versus possible carotid artery stenosis. Plan for EKG and syncope work up. Plan on CTA head neck to rule out dissection versus significant carotid artery stenosis. Non focal neurologic exam. Normal strength and sensation. Will trial percocet for pain  EKG personally reviewed by myself showed NSR with sinus arrythmia Rate of 79, PR 12m, QRS 854mQT/QTC 368/42156mnormal axis, without evidence of new ischemia. Comparison showed sinus bradycardia, indication: right arm pain  CXR Pa/LAt per my read for syncope showed no focal consolidation no cardiomegaly  Review of labs: CBC: no leukocytosis, H&H 16.5/47.9 CMP: no electrolyte abnormalities, no elevated ALT/AST. Elevated Alk phos istat Troponin: 0.02  CTA head neck no evidence of significant carotid stenosis. No evidence of dissection. Plan for discharge. Patient likely with cervical radiculopathy. Plan to discharge with close follow up with PCP for possible MRI of neck. Strict return precautions given. Will give script for percocet for pain. Had relief with percocet  11:19 AM: I have discussed the diagnosis/risks/treatment options with the patient and believe the pt to be eligible for discharge home to follow-up with PCP. We  also discussed returning to the ED immediately if new or worsening sx occur. We discussed the sx which are most concerning (e.g., worsening symptoms) that necessitate immediate return. Any new prescriptions provided to the patient are listed below.   New Prescriptions   No medications on file    The patient appears reasonably screened and/or stabilized for discharge and I doubt any other medical condition or other [facility]iring further screening, evaluation or treatment in the ED at this time prior to discharge . Pt in agreement with discharge plan. Return precautions given. Pt discharged VSS  Labs, EKG  and imaging reviewed by myself and considered in medical decision making if ordered.  Imaging interpreted by radiology. Pt was discussed with my attending, Dr. Missy Sabins, MD 06/03/14 [place], MD 06/03/14 [phone]

## 2014-06-05 ENCOUNTER — Telehealth: Payer: Self-pay | Admitting: Internal Medicine

## 2014-06-05 DIAGNOSIS — M5412 Radiculopathy, cervical region: Secondary | ICD-10-CM

## 2014-06-05 NOTE — Progress Notes (Signed)
Quick Note:  Spoke with pt and notified of results per Dr. [name]. Pt verbalized understanding and denied any questions.  ______ 

## 2014-06-05 NOTE — Telephone Encounter (Signed)
Spoke with the pt  He states after last ov went to ED with numbness in hands and neck pain  He was advised that have MW review all of his records and advise whether or not he needs MRI or referral to specialist  He states that they believe he has a pinched nerve  He cancelled appt for heart monitor to be set up and asking if he needs to reschedule this  Please advise, thanks!

## 2014-06-05 NOTE — Telephone Encounter (Signed)
Needs to go ahead with holter as his numbness in hands and neck pain would have nothing to do with his passing out.  Per ER needs neurosurgery eval for cervical radiculopathy - ok to refer to Dr [name]

## 2014-06-06 NOTE — Telephone Encounter (Signed)
Called and spoke to pt. Informed pt of the recs per MW. Pt aware someone will contact him to schedule appt with Dr. [name]. Referral order placed. Pt verbalized understanding and denied any further questions or concerns at this time.

## 2014-06-08 ENCOUNTER — Telehealth: Payer: Self-pay | Admitting: Internal Medicine

## 2014-06-08 MED ORDER — OXYCODONE-ACETAMINOPHEN 5-325 MG PO TABS: 1.0000 | ORAL_TABLET | Freq: Four times a day (QID) | ORAL | Status: DC | PRN

## 2014-06-08 NOTE — Telephone Encounter (Signed)
Spoke with the pt  He is almost out of percocet given for pain at ED on 06/03/14 # 18 given, 3 tabs remaining  Having severe pain when tried to just take tylenol alone  Neuro appt not set up yet, but referral was sent  Per [facility]- okay to give # 20 tablets pending appt with Dr. [name]  Rx printed and up front for pick up  Pt aware  Will forward to MW so he is aware

## 2014-06-11 ENCOUNTER — Ambulatory Visit (INDEPENDENT_AMBULATORY_CARE_PROVIDER_SITE_OTHER): Payer: Medicare Other | Admitting: Internal Medicine

## 2014-06-11 ENCOUNTER — Other Ambulatory Visit: Payer: Self-pay | Admitting: Internal Medicine

## 2014-06-11 ENCOUNTER — Ambulatory Visit (HOSPITAL_COMMUNITY)
Admission: RE | Admit: 2014-06-11 | Discharge: 2014-06-11 | Disposition: A | Discharge status: Home / Self Care | Payer: Medicare Other | Source: Ambulatory Visit | Attending: Internal Medicine | Admitting: Internal Medicine

## 2014-06-11 VITALS — BP 114/78 | HR 66 | Ht 68.0 in | Wt 179.4 lb

## 2014-06-11 DIAGNOSIS — M5090 Cervical disc disorder, unspecified, unspecified cervical region: Secondary | ICD-10-CM

## 2014-06-11 DIAGNOSIS — M2578 Osteophyte, vertebrae: Secondary | ICD-10-CM | POA: Insufficient documentation

## 2014-06-11 DIAGNOSIS — M25511 Pain in right shoulder: Secondary | ICD-10-CM | POA: Insufficient documentation

## 2014-06-11 DIAGNOSIS — M4802 Spinal stenosis, cervical region: Secondary | ICD-10-CM | POA: Diagnosis not present

## 2014-06-11 DIAGNOSIS — M542 Cervicalgia: Secondary | ICD-10-CM | POA: Diagnosis not present

## 2014-06-11 DIAGNOSIS — R200 Anesthesia of skin: Secondary | ICD-10-CM | POA: Diagnosis not present

## 2014-06-11 DIAGNOSIS — M47812 Spondylosis without myelopathy or radiculopathy, cervical region: Secondary | ICD-10-CM | POA: Diagnosis not present

## 2014-06-11 MED ORDER — PREDNISONE 10 MG PO TABS: ORAL_TABLET | ORAL | Status: DC

## 2014-06-11 MED ORDER — OXYCODONE-ACETAMINOPHEN 5-325 MG PO TABS: 1.0000 | ORAL_TABLET | ORAL | Status: DC | PRN

## 2014-06-11 NOTE — Patient Instructions (Signed)
Please see patient coordinator before you leave today  to schedule MRI c spine asap

## 2014-06-11 NOTE — Telephone Encounter (Signed)
Called and spoke to pt. Pt stated he is still in a lot of pain since 11/8. Pt stated he is having right hand numbness, right arm aching and when he lays down he feels he has needles in his back. Pt thinks he has shingles. Pt is requesting to be seen. Appt made with MW on 11/16 at 11:45. Pt verbalized understanding and denied any further questions or concerns at this time.

## 2014-06-11 NOTE — Telephone Encounter (Signed)
Patient calling stating the pain in his back is not any better.  He is still in a lot of pain.  Requesting to speak to nurse.  [phone]

## 2014-06-12 NOTE — Progress Notes (Signed)
Quick Note:  Spoke with pt and notified of results per Dr. [name]. Pt verbalized understanding and denied any questions.  ______ 

## 2014-06-13 ENCOUNTER — Encounter: Payer: Self-pay | Admitting: Internal Medicine

## 2014-06-13 NOTE — Progress Notes (Signed)
Subjective:    Patient ID: Antonio Carlson, male    DOB: January 20, 1946   MRN: [id number]  Brief patient profile:  68 yowm quit smoking in 1971 with history of Tri[facility] Asthma and hyperlipidemia   HPI 10/01/2011 f/u ov/Antonio Carlson cc Pt c/o chills on and off x 5 days, had night sweats last night. He also c/o weakness in legs and overall feels achy. He has prod cough with minimal clear sputum. Aches all over. On minocin until 24 h pt ov x weeks. Exposed to dead rabbits a week or so ago rec D/c minocin ? Rxn to it Push fluids   10/29/2011 f/u ov/Antonio Carlson cc all better x persistent chronic daytime  cough minimal variable daily min yellowish mucus also draining from nose but better than baseline.   Also here to f/u hbp/hyperlipidemia on zocor 40, no sob or tia or claudication symtpoms. rec Please remember to go to the lab:  lft's ok, ldl 142 > 148   02/05/2012 f/u ov/Antonio Carlson here for f/u lipids/ asthma cc  abrupt onset left hip pain x 2 wks ago, getting better now but wants to have xray. Pain onset while playing corn ball with assoc ecchymosis x sev inches, also resolved. No radicular features or numbness/ weakness leg or foot. rec Check labs > ok with ldl 124 No change rx   05/10/2012 f/u ov/Antonio Carlson cc L hip pain resolved but popping in low back when walk radiates to R not bad enough to even take tylenol, no radicular symptoms or back pain.Did not take any meds prior to today's ov. C/o mild sensation of pnds.  Not using symbicort "unless he needs it" but no saba daytime use at all.  rec Ortho referral   08/10/2012 f/u ov/Antonio Carlson cc comprehensive yearly eval for hbp/ cr/asthma/ gerd doing well with no limiting sob   rec No change    11/14/2012 f/u ov/Antonio Carlson re asthma, ran out of symciort x one week Chief Complaint  Patient presents with  . Follow-up    Increased DOE and cough x 2 wks. Cough is prod with white sputum. He also c/o wheezing with exertion and at night when he lies down.    no nasal flare. Turns out he  ran out of symbicort 160 about the same time his cough and sob started and can't afford this on his present formulary.  Called in prednisone and a little better. Not really understanding how to use saba but while on symbicort rarely needed  any saba at all. >>Pred /Dulera rx    03/14/2013 f/u ov/Durene Dodge re asthma/ hyperlipidemia / hbp Chief Complaint  Patient presents with  . 3 month follow up    Breathing doing well overall.  SOB and cough have resolved. No wheezing, chest tightness, or chest pain.     tapered symbicort 160 on his own to one puff daily with no increase in sob, cough and no need for saba  07/03/2013 acute  ov/Antonio Carlson re:  Chief Complaint  Patient presents with  . Acute Visit    Pt c/o cough x 4 days- prod with moderate clear sputum. He also c/o rhinitis and left ear "stopped up" since this am.  Did have sore throat but resolved, no rigors or HA or sob over baseline, still just using the symbicort 1 pff in am rec Prednisone 10 mg take  4 each am x 2 days,   2 each am x 2 days,  1 each am x 2 days and stop  For cough take  delsym 2 tsp every 12 hours and pepcid ac 20 mg at bedtime> resolved    08/14/2013 f/u ov/Antonio Carlson re:  Yearly eval: Asthma/ hbp/ hyperlipidemia  Chief Complaint  Patient presents with  . Annual Exam    Pt fasting. He states he is doing well and denies any co's today.   Nasal drainage, watery, has not used the afrin approp No need for saba  No tia, claudication, no sob at all rec No change rx   01/31/2014 acute ov/Antonio Carlson re: back pain/ f/u asthma/hbp/hyperlipidemia Chief Complaint  Patient presents with  . Follow-up    Pt c/o pain in left side- started in March 2015 after had a fall. He states that pain is dull and bothers him when he turns a certain way.   lower L post chest, entirely positional, not pleuritic. No radiation, acute onset p straining. No urinary symptoms >no changes   03/22/2014 Follow up and Med Review  Returns for a one-month followup for  Tri[facility] Asthma and med review. We reviewed all his medications and organized them into a medication calendar with patient education. Patient is unable to afford his Symbicort, as it is a high tear copayment under his insurance. He requests to switch to a nebulizer form. We discussed changing Symbicort to budesonide and Perforomist.  He has discussed this with his insurance company and is planning on using the  part B to his pharmacy instead of going through a DME company. He also requested. Using his wife's oxygen concentrator as a nebulizer machine, however, it. We explained that this did not provide enough pressure to deliver the nebulizer. Medications. We reluctantly. Patient agreed for a nebulizer machine ordered to be sent.    05/31/2014 acute  ov/Antonio Carlson re: syncope/ presyncope Chief Complaint  Patient presents with  . Acute Visit    Pt states has had syncopal episode x 2 in the past 2 days. He states that it starts out as a "chill sensation"- goes away if he lies down.   spell sitting x 2 no incont or post ictal sensation no nausea or cp or vertigo/ ha bp and pulse were fine 3 d prior to OV  When had a spell In a doctors office but had recovered by that time  No reproduction standing but always goes away if lies down  Good ex tol between episodes Seems like the same problem he had with neg w/u in 2009 by Dr Stanford Breed  rec Please see patient coordinator before you leave today  to schedule holter monitor If get light headed lie down right away No driving until no spells x 2 weeks    06/12/2014 acute  ov/Antonio Carlson re: new R arm pain Chief Complaint  Patient presents with  . Follow-up    pain right side back up the shoulder down the arm.  acute onset x 7 days prior to OV  Neck to R shoulder to R hand classic C8 nerve root pattern, worse with ext/ flex of neck. No rash/fever Assoc with slt loss of hand grip strength on R     No obvious day to day or daytime variabilty or assoc chronic cough  or cp or chest tightness, subjective wheeze overt sinus or hb symptoms. No unusual exp hx or h/o childhood pna/ asthma or knowledge of premature birth.  Sleeping ok without nocturnal  or early am exacerbation  of respiratory  c/o's or need for noct saba. Also denies any obvious fluctuation of symptoms with weather or environmental changes or other  aggravating or alleviating factors except as outlined above   Current Medications, Allergies, Complete Past Medical History, Past Surgical History, Family History, and Social History were reviewed in [employer] record.  ROS  The following are not active complaints unless bolded sore throat, dysphagia, dental problems, itching, sneezing,  nasal congestion or excess/ purulent secretions, ear ache,   fever, chills, sweats, unintended wt loss, pleuritic or exertional cp, hemoptysis,  orthopnea pnd or leg swelling, presyncope, palpitations, heartburn, abdominal pain, anorexia, nausea, vomiting, diarrhea  or change in bowel or urinary habits, change in stools or urine, dysuria,hematuria,  rash, arthralgias, visual complaints, headache, numbness weakness or ataxia or problems with walking or coordination,  change in mood/affect or memory.               Past Medical History:  PVCs.  Syncope  - Holter ordered June 27, 2008 > nl  - EP consult June 27, 2008  OSTEOPENIA (ICD-733.90)  COLONIC POLYPS (ICD-211.3) .......................................[name] Kitchen [name] - see colonoscopy 11/07/10  DIVERTICULOSIS, MILD (ICD-562.10) ASTHMA (ICD-493.90)  - HFA 90% June 11, 2009  ALLERGIC RHINITIS, CHRONIC (ICD-477.9)........................[name]  - steroid dep until mid oct 2009  - Allergy profile sent January 23, 2010 >> IgE 18.3  - Polypectomy Sept 2012 ...................................................... [name]  BENIGN PROSTATIC HYPERTROPHY, HX OF (ICD-V13.8) .[name] Kitchen... [name] NEPHROLITHIASIS (ICD-592.0)  HYPERLIPIDEMIA (ICD-272.4)  target < 130 male, pos fm hx, h/l smoking  R Shoulder pain..................................................................... [place].......................................................[name] KitchenWert  - CPX  08/14/2013  - Pneumovax 11/2004 and @ age 5 June 11, 2009 and Prevnar 08/14/2013  - Td 07/2005  -Med calendar 12/12/2012 , 03/22/2014   Past Surgical History:  Appendectomy  Colonoscopy   Family History:  heart disease in his father at age 25 he was a smoker  Ca brain half brother siblings healthy but their half mother still living without ascvd  But dementia onset late 31s  Social History:  quit smoking 1971  rarely drink alcohol  Retired               Objective:   Physical Exam  wt 198 May 17, 2008 >  > 192 07/17/11 >  02/05/2012  191> 05/10/2012  188> 08/10/2012 190> 11/14/2012 180>182 12/12/2012 > 03/14/2013 182 > 07/06/2013 185 > 08/14/2013  184 > 01/31/2014  176 >176 .03/22/2014 >  05/31/2014   181 > 06/11/14  179   Ambulatory healthy appearing wm in no acute distress / no change in bp standing  HEENT: nl dentition, turbinates, and orophanx. Nl external ear canals without cough reflex  Neck without JVD/Nodes/TM  Lungs clear to A and P bilaterally without cough on insp or exp maneuvers  RRR no s3 or murmur or increase in P2 / no edema/ nl pulses carotid and feet no bruits Abd soft and benign with nl excursion in the supine position. No bruits or organomegaly  Ext warm without calf tenderness, cyanosis clubbing or edema  Skin warm and dry without lesions  - no rash or ecchymosis or sup tenderness L low back MS nl gait,         ekg 05/31/14 nsr/ wnl       Recent Labs Lab 05/31/14 1236  NA 140  K 4.2  CL 102  CO2 24  BUN 16  CREATININE 1.0  GLUCOSE 93    Recent Labs Lab 05/31/14 1236  HGB 15.7  HCT 47.4  WBC 9.2  PLT 294.0     Lab Results  Component Value Date  TSH 1.14 05/31/2014                 Assessment & Plan:

## 2014-06-13 NOTE — Assessment & Plan Note (Addendum)
onset pain 06/02/14 - given pred 06/01/14 x 6 days course  Classic C8 pattern s rash  strongly suggest disc between C 6-7 as culprit > Discussed in detail all the  indications, usual  risks and alternatives  relative to the benefits with patient who agrees to proceed with short course of steroids (can't takes nsaids due to triad asthma) and MRI/ neurosugical eval.

## 2014-06-15 ENCOUNTER — Telehealth: Payer: Self-pay | Admitting: Internal Medicine

## 2014-06-15 NOTE — Telephone Encounter (Signed)
Will send to MW as Juluis Rainier on patient.

## 2014-06-19 DIAGNOSIS — Z6835 Body mass index (BMI) 35.0-35.9, adult: Secondary | ICD-10-CM | POA: Diagnosis not present

## 2014-06-19 DIAGNOSIS — M5413 Radiculopathy, cervicothoracic region: Secondary | ICD-10-CM | POA: Diagnosis not present

## 2014-06-19 DIAGNOSIS — I10 Essential (primary) hypertension: Secondary | ICD-10-CM | POA: Diagnosis not present

## 2014-06-28 DIAGNOSIS — Z6827 Body mass index (BMI) 27.0-27.9, adult: Secondary | ICD-10-CM | POA: Diagnosis not present

## 2014-06-28 DIAGNOSIS — I10 Essential (primary) hypertension: Secondary | ICD-10-CM | POA: Diagnosis not present

## 2014-06-28 DIAGNOSIS — M5413 Radiculopathy, cervicothoracic region: Secondary | ICD-10-CM | POA: Diagnosis not present

## 2014-07-17 ENCOUNTER — Other Ambulatory Visit: Payer: Self-pay | Admitting: Internal Medicine

## 2014-08-14 ENCOUNTER — Other Ambulatory Visit: Payer: Self-pay | Admitting: Neurosurgery

## 2014-08-14 DIAGNOSIS — M5412 Radiculopathy, cervical region: Secondary | ICD-10-CM

## 2014-08-20 ENCOUNTER — Other Ambulatory Visit (INDEPENDENT_AMBULATORY_CARE_PROVIDER_SITE_OTHER): Payer: Medicare Other

## 2014-08-20 ENCOUNTER — Encounter: Payer: Self-pay | Admitting: Internal Medicine

## 2014-08-20 ENCOUNTER — Ambulatory Visit (INDEPENDENT_AMBULATORY_CARE_PROVIDER_SITE_OTHER): Payer: Medicare Other | Admitting: Internal Medicine

## 2014-08-20 VITALS — BP 126/78 | HR 64 | Temp 97.5°F | Ht 68.0 in | Wt 184.0 lb

## 2014-08-20 DIAGNOSIS — J45909 Unspecified asthma, uncomplicated: Secondary | ICD-10-CM

## 2014-08-20 DIAGNOSIS — J45998 Other asthma: Secondary | ICD-10-CM | POA: Diagnosis not present

## 2014-08-20 DIAGNOSIS — J339 Nasal polyp, unspecified: Secondary | ICD-10-CM

## 2014-08-20 DIAGNOSIS — Z886 Allergy status to analgesic agent status: Secondary | ICD-10-CM

## 2014-08-20 DIAGNOSIS — I10 Essential (primary) hypertension: Secondary | ICD-10-CM

## 2014-08-20 DIAGNOSIS — E785 Hyperlipidemia, unspecified: Secondary | ICD-10-CM | POA: Diagnosis not present

## 2014-08-20 LAB — LIPID PANEL
Cholesterol: 186 mg/dL (ref 0–200)
HDL: 44.6 mg/dL (ref >39.00)
NONHDL: 141.4
Total CHOL/HDL Ratio: 4
Triglycerides: 214 mg/dL — ABNORMAL HIGH (ref 0.0–149.0)
VLDL: 42.8 mg/dL — AB (ref 0.0–40.0)

## 2014-08-20 LAB — LDL CHOLESTEROL, DIRECT: Direct LDL: 122 mg/dL

## 2014-08-20 NOTE — Assessment & Plan Note (Signed)
Adequate control on present rx, reviewed > no change in rx needed   

## 2014-08-20 NOTE — Patient Instructions (Addendum)
Call Dr [name] for any further problems with your hand strength or numbness   Please remember to go to the lab   department downstairs for your tests - we will call you with the results when they are available.  See calendar for specific medication instructions and bring it back for each and every office visit for every healthcare provider you see.  Without it,  you may not receive the best quality medical care that we feel you deserve.  You will note that the calendar groups together  your maintenance  medications that are timed at particular times of the day.  Think of this as your checklist for what your doctor has instructed you to do until your next evaluation to see what benefit  there is  to staying on a consistent group of medications intended to keep you well.  The other group at the bottom is entirely up to you to use as you see fit  for specific symptoms that may arise between visits that require you to treat them on an as needed basis.  Think of this as your action plan or "what if" list.   Separating the top medications from the bottom group is fundamental to providing you adequate care going forward.    Please schedule a follow up office visit in 6 months , call sooner if needed

## 2014-08-20 NOTE — Assessment & Plan Note (Addendum)
target < 130 male, pos fm hx, h/o smoking   - 08/11/2011 changed to 40 mg per day  Lab Results  Component Value Date   CHOL 186 08/20/2014   HDL 44.60 08/20/2014   LDLCALC 127* 08/14/2013   LDLDIRECT 122.0 08/20/2014   TRIG 214.0* 08/20/2014   CHOLHDL 4 08/20/2014     Adequate control on present rx, reviewed > no change in rx needed  = zocor 40 mg daily, lfts ok 05/2014

## 2014-08-20 NOTE — Progress Notes (Signed)
Subjective:    Patient ID: Antonio Carlson, male    DOB: 07-05-1946   MRN: [id number]  Brief patient profile:  69 yowm quit smoking in 1971 with history of Triad Asthma and hyperlipidemia and hbp   HPI 02/05/2012 f/u ov/Antonio Carlson here for f/u lipids/ asthma cc  abrupt onset left hip pain x 2 wks ago, getting better now but wants to have xray. Pain onset while playing corn ball with assoc ecchymosis x sev inches, also resolved. No radicular features or numbness/ weakness leg or foot. rec Check labs > ok with ldl 124 No change rx   05/31/2014 acute  ov/Antonio Carlson re: syncope/ presyncope Chief Complaint  Patient presents with  . Acute Visit    Pt states has had syncopal episode x 2 in the past 2 days. He states that it starts out as a "chill sensation"- goes away if he lies down.   spell sitting x 2 no incont or post ictal sensation no nausea or cp or vertigo/ ha bp and pulse were fine 3 d prior to OV  When had a spell In a doctors office but had recovered by that time  No reproduction standing but always goes away if lies down  Good ex tol between episodes Seems like the same problem he had with neg w/u in 2009 by Dr Antonio Carlson  rec Please see patient coordinator before you leave today  to schedule holter monitor If get light headed lie down right away No driving until no spells x 2 weeks    06/12/2014 acute  ov/Antonio Carlson re: new R arm pain Chief Complaint  Patient presents with  . Follow-up    pain right side back up the shoulder down the arm.  acute onset x 7 days prior to OV  Neck to R shoulder to R hand classic C8 nerve root pattern, worse with ext/ flex of neck. No rash/fever Assoc with slt loss of hand grip strength on R > Pool eval rec steroid rx plus gabapentin > resolved    08/20/2014 f/u ov/Antonio Carlson re: yearly eval fo rasthma, hyperlipidemia, hbp Chief Complaint  Patient presents with  . Annual Exam    Pt is fasting. He denies any new co's today.   still some numbness in R C8  distribution.  No ex cp, tia, claudication, wheeze or sob     No obvious day to day or daytime variabilty or assoc chronic cough or  chest tightness, subjective wheeze overt sinus or hb symptoms. No unusual exp hx or h/o childhood pna/ asthma or knowledge of premature birth.  Sleeping ok without nocturnal  or early am exacerbation  of respiratory  c/o's or need for noct saba. Also denies any obvious fluctuation of symptoms with weather or environmental changes or other aggravating or alleviating factors except as outlined above   Current Medications, Allergies, Complete Past Medical History, Past Surgical History, Family History, and Social History were reviewed in [employer] record.  ROS  The following are not active complaints unless bolded sore throat, dysphagia, dental problems, itching, sneezing,  nasal congestion or excess/ purulent secretions, ear ache,   fever, chills, sweats, unintended wt loss, pleuritic or exertional cp, hemoptysis,  orthopnea pnd or leg swelling, presyncope, palpitations, heartburn, abdominal pain, anorexia, nausea, vomiting, diarrhea  or change in bowel or urinary habits, change in stools or urine, dysuria,hematuria,  rash, arthralgias, visual complaints, headache, numbness weakness or ataxia or problems with walking or coordination,  change in mood/affect or memory.  Past Medical History:  PVCs.  Syncope  - Holter ordered June 27, 2008 > nl  - EP consult June 27, 2008  OSTEOPENIA (ICD-733.90)  COLONIC POLYPS (ICD-211.3) .......................................[name] Kitchen Antonio Carlson - see colonoscopy 11/07/10  DIVERTICULOSIS, MILD (ICD-562.10) ASTHMA (ICD-493.90)  - HFA 90% June 11, 2009  ALLERGIC RHINITIS, CHRONIC (ICD-477.9).......................  Antonio Carlson  - steroid dep until mid oct 2009  - Allergy profile sent January 23, 2010 >> IgE 18.3  - Polypectomy Sept 2012 ......................................................  Antonio Carlson  BENIGN PROSTATIC HYPERTROPHY, HX OF (ICD-V13.8) .[name] Kitchen... Antonio Carlson NEPHROLITHIASIS (ICD-592.0)  HYPERLIPIDEMIA (ICD-272.4) target < 130 male, pos fm hx, h/l smoking  R Shoulder pain..................................................................... Antonio Carlson.......................................................[name] KitchenWert  R C8 radiculopathy 2015 ....................................................... Antonio Carlson  - CPX  08/20/2014  - Pneumovax 11/2004 and @ age 58 June 11, 2009 and Prevnar 08/14/2013  - Td 07/2005  -Med calendar 12/12/2012 , 03/22/2014   Past Surgical History:  Appendectomy  Colonoscopy   Family History:  heart disease in his father onset at age 85 he was a smoker  Ca brain half brother siblings healthy but their half Mother dementia onset late 15s lived to be 71    Social History:  quit smoking 1971  rarely drink alcohol  Retired               Objective:   Physical Exam  wt 198 May 17, 2008 >  > 192 07/17/11 >  02/05/2012  191> 05/10/2012  188> 08/10/2012 190> 11/14/2012 180>182 12/12/2012 > 03/14/2013 182 > 07/06/2013 185 > 08/14/2013  184 > 01/31/2014  176 >176 .03/22/2014 >  05/31/2014   181 > 06/11/14  179 > 08/20/2014   184  Ambulatory healthy appearing wm in no acute distress / no change in bp standing  HEENT: nl dentition, turbinates, and orophanx. Nl external ear canals without cough reflex  Neck without JVD/Nodes/TM  Lungs clear to A and P bilaterally without cough on insp or exp maneuvers  RRR no s3 or murmur or increase in P2 / no edema/ nl pulses carotid and feet no bruits Abd soft and benign with nl excursion in the supine position. No bruits or organomegaly  Ext warm without calf tenderness, cyanosis clubbing or edema  Skin warm and dry without lesions  - no rash or ecchymosis  MS nl gait Neuro : slt decrease R hand grip vs L         ekg 05/31/14 nsr/ wnl   Labs ordered today /  reviewed include:       Chemistry      Component Value Date/Time   NA 139 06/03/2014 0905   K 3.8 06/03/2014 0905   CL 101 06/03/2014 0905   CO2 27 06/03/2014 0856   BUN 14 06/03/2014 0905   CREATININE 0.90 06/03/2014 0905      Component Value Date/Time   CALCIUM 9.7 06/03/2014 0856   ALKPHOS 129* 06/03/2014 0856   AST 25 06/03/2014 0856   ALT 29 06/03/2014 0856   BILITOT 0.3 06/03/2014 0856       Lab Results  Component Value Date   CHOL 186 08/20/2014   HDL 44.60 08/20/2014   LDLCALC 127* 08/14/2013   LDLDIRECT 122.0 08/20/2014   TRIG 214.0* 08/20/2014   CHOLHDL 4 08/20/2014                  Assessment & Plan:

## 2014-10-01 ENCOUNTER — Other Ambulatory Visit: Payer: Self-pay | Admitting: Internal Medicine

## 2014-10-01 NOTE — Telephone Encounter (Signed)
Ativan last refilled 02/10/14 #60 x 2 refills Metoprolol last refilled 03/26/14 #90 x 1 refill Please advise MW thanks

## 2014-11-28 ENCOUNTER — Other Ambulatory Visit: Payer: Self-pay | Admitting: Internal Medicine

## 2015-01-16 ENCOUNTER — Telehealth: Payer: Self-pay | Admitting: Internal Medicine

## 2015-01-16 ENCOUNTER — Other Ambulatory Visit: Payer: Self-pay | Admitting: Internal Medicine

## 2015-01-16 MED ORDER — FORMOTEROL FUMARATE 20 MCG/2ML IN NEBU: 20.0000 ug | INHALATION_SOLUTION | Freq: Two times a day (BID) | RESPIRATORY_TRACT | Status: DC

## 2015-01-16 MED ORDER — BUDESONIDE 0.25 MG/2ML IN SUSP: 0.2500 mg | Freq: Two times a day (BID) | RESPIRATORY_TRACT | Status: DC

## 2015-01-16 MED ORDER — OMEPRAZOLE 20 MG PO CPDR: 20.0000 mg | DELAYED_RELEASE_CAPSULE | Freq: Every day | ORAL | Status: DC

## 2015-01-16 NOTE — Telephone Encounter (Signed)
Spoke with pt, states he went to go pick up perforomist and budesonide, only received a partial rx.  Pt needs a new rx for both of these to [facility] on [address].  Pt also needs a refill on omeprazole.  These have all been sent.  Nothing further needed.

## 2015-01-18 ENCOUNTER — Telehealth: Payer: Self-pay | Admitting: Adult Health

## 2015-01-18 NOTE — Telephone Encounter (Signed)
lmtcb X1 for pt  

## 2015-01-21 NOTE — Telephone Encounter (Signed)
Len Blalock, CMA at 01/16/2015 3:43 PM     Status: Signed       Expand All Collapse All   Spoke with pt, states he went to go pick up perforomist and budesonide, only received a partial rx. Pt needs a new rx for both of these to [facility] on [address]. Pt also needs a refill on omeprazole. These have all been sent. Nothing further needed.        Spoke with pt- states that this has been taken care of and Nothing further needed.

## 2015-02-15 ENCOUNTER — Other Ambulatory Visit: Payer: Self-pay | Admitting: Internal Medicine

## 2015-02-18 ENCOUNTER — Encounter: Payer: Self-pay | Admitting: Internal Medicine

## 2015-02-18 ENCOUNTER — Ambulatory Visit (INDEPENDENT_AMBULATORY_CARE_PROVIDER_SITE_OTHER): Payer: Medicare Other | Admitting: Internal Medicine

## 2015-02-18 ENCOUNTER — Telehealth: Payer: Self-pay | Admitting: Internal Medicine

## 2015-02-18 VITALS — BP 122/84 | HR 69 | Ht 68.0 in | Wt 180.6 lb

## 2015-02-18 DIAGNOSIS — I10 Essential (primary) hypertension: Secondary | ICD-10-CM | POA: Diagnosis not present

## 2015-02-18 DIAGNOSIS — J45998 Other asthma: Secondary | ICD-10-CM

## 2015-02-18 DIAGNOSIS — R058 Other specified cough: Secondary | ICD-10-CM

## 2015-02-18 DIAGNOSIS — J309 Allergic rhinitis, unspecified: Secondary | ICD-10-CM | POA: Diagnosis not present

## 2015-02-18 DIAGNOSIS — Z886 Allergy status to analgesic agent status: Secondary | ICD-10-CM

## 2015-02-18 DIAGNOSIS — R05 Cough: Secondary | ICD-10-CM

## 2015-02-18 DIAGNOSIS — J45909 Unspecified asthma, uncomplicated: Secondary | ICD-10-CM

## 2015-02-18 DIAGNOSIS — J339 Nasal polyp, unspecified: Secondary | ICD-10-CM

## 2015-02-18 DIAGNOSIS — M5090 Cervical disc disorder, unspecified, unspecified cervical region: Secondary | ICD-10-CM

## 2015-02-18 MED ORDER — OMEPRAZOLE 40 MG PO CPDR: 40.0000 mg | DELAYED_RELEASE_CAPSULE | Freq: Every day | ORAL | Status: DC

## 2015-02-18 NOTE — Patient Instructions (Addendum)
Increase flonase to twice daily   Increase omeprazole to 40 mg   Take 30-60 min before first meal of the day and Pepcid ac (famotidine) 20 mg one @  bedtime until  Return  For drainage take chlortrimeton (chlorpheniramine) 4 mg every 4 hours available over the counter (may cause drowsiness) (other choice is zyrtec)  GERD (REFLUX)  is an extremely common cause of respiratory symptoms just like yours , many times with no obvious heartburn at all.    It can be treated with medication, but also with lifestyle changes including elevation of the head of your bed (ideally with 6 inch  bed blocks),  Smoking cessation, avoidance of late meals, excessive alcohol, and avoid fatty foods, chocolate, peppermint, colas, red wine, and acidic juices such as orange juice.  NO MINT OR MENTHOL PRODUCTS SO NO COUGH DROPS  USE SUGARLESS CANDY INSTEAD (Jolley ranchers or Stover's or Life Savers) or even ice chips will also do - the key is to swallow to prevent all throat clearing. NO OIL BASED VITAMINS - use powdered substitutes.     Please schedule a follow up office visit in 6 weeks, call sooner if needed to see Tammy for new calendar

## 2015-02-18 NOTE — Telephone Encounter (Signed)
Called pt and made aware RX was sent in today already to [facility]. Nothing further needed

## 2015-02-18 NOTE — Progress Notes (Signed)
Subjective:    Patient ID: Antonio Carlson, male    DOB: April 27, 1946   MRN: [id number]  Brief patient profile:  26 yowm quit smoking in 1971 with history of Triad Asthma and hyperlipidemia and hbp   HPI  06/12/2014 acute  ov/[name] re: new R arm pain Chief Complaint  Patient presents with  . Follow-up    pain right side back up the shoulder down the arm.  acute onset x 7 days prior to OV  Neck to R shoulder to R hand classic C8 nerve root pattern, worse with ext/ flex of neck. No rash/fever Assoc with slt loss of hand grip strength on R > Pool eval rec steroid rx plus gabapentin > resolved    08/20/2014 f/u ov/[name] re: yearly eval fo rasthma, hyperlipidemia, hbp Chief Complaint  Patient presents with  . Annual Exam    Pt is fasting. He denies any new co's today.   still some numbness in R C8 distribution.  No ex cp, tia, claudication, wheeze or sob  rec Call Dr [name] for any further problems with your hand strength or numbness > improved    02/18/2015 f/u ov/[name] re: triad asthma on laba/ics neb bid  Chief Complaint  Patient presents with  . Follow-up    Pt c/o cough for the past 1-2 months.  Cough is worse in the am and evening and is prod with clear to yellow sputum.    R arm better/ new problem: says doesn't cough much p am laba/ics neb / worse around 3-4 pm  But delays the pm maint dose until 9 pm, taking otc for nose but does not know name    No obvious day to day or daytime variabilty or assoc   Sob / chest tightness, subjective wheeze overt sinus or hb symptoms. No unusual exp hx or h/o childhood pna/ asthma or knowledge of premature birth.  Sleeping ok without nocturnal  or early am exacerbation  of respiratory  c/o's or need for noct saba. Also denies any obvious fluctuation of symptoms with weather or environmental changes or other aggravating or alleviating factors except as outlined above   Current Medications, Allergies, Complete Past Medical History, Past Surgical  History, Family History, and Social History were reviewed in [employer] record.  ROS  The following are not active complaints unless bolded sore throat, dysphagia, dental problems, itching, sneezing,  nasal congestion or excess/ purulent secretions, ear ache,   fever, chills, sweats, unintended wt loss, pleuritic or exertional cp, hemoptysis,  orthopnea pnd or leg swelling, presyncope, palpitations, heartburn, abdominal pain, anorexia, nausea, vomiting, diarrhea  or change in bowel or urinary habits, change in stools or urine, dysuria,hematuria,  rash, arthralgias, visual complaints, headache, numbness weakness or ataxia or problems with walking or coordination,  change in mood/affect or memory.               Past Medical History:  PVCs.  Syncope  - Holter ordered June 27, 2008 > nl  - EP consult June 27, 2008  OSTEOPENIA (ICD-733.90)  COLONIC POLYPS (ICD-211.3) .......................................[name] Kitchen [name] - see colonoscopy 11/07/10  DIVERTICULOSIS, MILD (ICD-562.10) ASTHMA (ICD-493.90)  - HFA 90% June 11, 2009  ALLERGIC RHINITIS, CHRONIC (ICD-477.9).......................  [name]  - steroid dep until mid oct 2009  - Allergy profile sent January 23, 2010 >> IgE 18.3  - Polypectomy Sept 2012 ...................................................... [name]  BENIGN PROSTATIC HYPERTROPHY, HX OF (ICD-V13.8) .[name] Kitchen... [name] NEPHROLITHIASIS (ICD-592.0)  HYPERLIPIDEMIA (ICD-272.4) target < 130 male, pos  fm hx, h/l smoking  R Shoulder pain..................................................................... [place].......................................................[name] KitchenWert  R C8 radiculopathy 2015 ....................................................... [name]  - CPX  08/20/2014  - Pneumovax 11/2004 and @ age 61 June 11, 2009 and Prevnar 08/14/2013  - Td 07/2005  -Med calendar 12/12/2012 , 03/22/2014   Past Surgical History:   Appendectomy  Colonoscopy   Family History:  heart disease in his father onset at age 49 he was a smoker  Ca brain half brother siblings healthy but their half Mother dementia onset late 84s lived to be 41    Social History:  quit smoking 1971  rarely drink alcohol  Retired          Objective:   Physical Exam  wt 198 May 17, 2008 >  > 192 07/17/11 >  02/05/2012  191> 05/10/2012  188> 08/10/2012 190> 11/14/2012 180>182 12/12/2012 > 03/14/2013 182 > 07/06/2013 185 > 08/14/2013  184 > 01/31/2014  176 >176 .03/22/2014 >  05/31/2014   181 > 06/11/14  179 > 08/20/2014   184> 02/18/2015  181   Ambulatory healthy appearing wm in no acute distress   HEENT: nl dentition, , and orophanx. Nl external ear canals without cough reflex - mod L turbinate swelling, mild on R  Neck without JVD/Nodes/TM  Lungs clear to A and P bilaterally without cough on insp or exp maneuvers  - Pos pseudoweeze resolves with purse lip maneuver  RRR no s3 or murmur or increase in P2 / no edema/ nl pulses carotid and feet no bruits Abd soft and benign with nl excursion in the supine position. No bruits or organomegaly  Ext warm without calf tenderness, cyanosis clubbing or edema  Skin warm and dry without lesions  - no rash or ecchymosis  MS nl gait Neuro : good grips now bilaterally           Labs today reviewed include:      Chemistry      Component Value Date/Time   NA 139 06/03/2014 0905   K 3.8 06/03/2014 0905   CL 101 06/03/2014 0905   CO2 27 06/03/2014 0856   BUN 14 06/03/2014 0905   CREATININE 0.90 06/03/2014 0905      Component Value Date/Time   CALCIUM 9.7 06/03/2014 0856   ALKPHOS 129* 06/03/2014 0856   AST 25 06/03/2014 0856   ALT 29 06/03/2014 0856   BILITOT 0.3 06/03/2014 0856       Lab Results  Component Value Date   CHOL 186 08/20/2014   HDL 44.60 08/20/2014   LDLCALC 127* 08/14/2013   LDLDIRECT 122.0 08/20/2014   TRIG 214.0* 08/20/2014   CHOLHDL 4 08/20/2014                   Assessment & Plan:

## 2015-02-24 ENCOUNTER — Encounter: Payer: Self-pay | Admitting: Internal Medicine

## 2015-02-24 NOTE — Assessment & Plan Note (Signed)
I reviewed both graphic and text formatted material regarding the diagnosis and management of rhinitis including how to use topical steroids effectively and why it is necessary to treat nasal obstruction and inflammation and infection all at  the same time to eradicate the cycle of inflammation causing obstruction causing an infection causing inflammation and so forth and so on....  rec continue flonase/ afrin prn rx 1st gen h1

## 2015-02-24 NOTE — Assessment & Plan Note (Signed)
Adequate control on present rx, reviewed > no change in rx needed   

## 2015-02-24 NOTE — Assessment & Plan Note (Signed)
May have component of cough variant asthma now, reviewed better timing for laba/ics to move it up as per calendar to a q12 rx  I had an extended discussion with the patient reviewing all relevant studies completed to date and  lasting 15 to 20 minutes of a 25 minute visit      Each maintenance medication was reviewed in detail including most importantly the difference between maintenance and as needed and under what circumstances the prns are to be used. This was done in the context of a medication calendar review which provided the patient with a user-friendly unambiguous mechanism for medication administration and reconciliation and provides an action plan for all active problems. It is critical that this be shown to every doctor  for modification during the office visit if necessary so the patient can use it as a working document.

## 2015-02-24 NOTE — Assessment & Plan Note (Signed)
Classic Upper airway cough syndrome, so named because it's frequently impossible to sort out how much is  CR/sinusitis with freq throat clearing (which can be related to primary GERD)   vs  causing  secondary (" extra esophageal")  GERD from wide swings in gastric pressure that occur with throat clearing, often  promoting self use of mint and menthol lozenges that reduce the lower esophageal sphincter tone and exacerbate the problem further in a cyclical fashion.   These are the same pts (now being labeled as having "irritable larynx syndrome" by some cough centers) who not infrequently have a history of having failed to tolerate ace inhibitors,  dry powder inhalers or biphosphonates or report having atypical reflux symptoms that don't respond to standard doses of PPI , and are easily confused as having aecopd or asthma flares by even experienced allergists/ pulmonologists.   Try adding 1st gen H1 prn and max gerd rx

## 2015-02-24 NOTE — Assessment & Plan Note (Signed)
C-8 onset pain 06/02/14 - given pred 06/01/14 x 6 days course  - 06/12/2014 MRI c spine  C6-7 broad-based disc osteophyte complex. Spinal stenosis with mild cord flattening. Uncinate hypertrophy slightly greater on the right. Moderate to marked foraminal narrowing greater on the right. C5-6 mild facet joint degenerative changes greater on right. Right-sided uncinate hypertrophy. Moderate to marked right sided and mild left-sided foraminal narrowing. Broad-based disc osteophyte complex greater to the right. Mild encroachment upon right ventral nerve roots >> referred to Dr [name] resolved s surgery.

## 2015-03-05 ENCOUNTER — Other Ambulatory Visit: Payer: Self-pay | Admitting: Adult Health

## 2015-03-05 ENCOUNTER — Other Ambulatory Visit: Payer: Self-pay | Admitting: Internal Medicine

## 2015-03-07 ENCOUNTER — Telehealth: Payer: Self-pay | Admitting: Internal Medicine

## 2015-03-07 ENCOUNTER — Other Ambulatory Visit: Payer: Self-pay | Admitting: Adult Health

## 2015-03-07 NOTE — Telephone Encounter (Signed)
Perforomist was refilled and sent to pharmacy  Called and spoke with pt informed him it was filled and sent to [facility] on [address]. Pt voiced understanding and had no further questions Nothing further needed.

## 2015-03-08 ENCOUNTER — Other Ambulatory Visit: Payer: Self-pay | Admitting: *Deleted

## 2015-04-02 ENCOUNTER — Ambulatory Visit (INDEPENDENT_AMBULATORY_CARE_PROVIDER_SITE_OTHER): Payer: Medicare Other | Admitting: Adult Health

## 2015-04-02 ENCOUNTER — Encounter: Payer: Self-pay | Admitting: Adult Health

## 2015-04-02 VITALS — BP 128/80 | HR 61 | Temp 97.7°F | Ht 68.0 in | Wt 181.0 lb

## 2015-04-02 DIAGNOSIS — Z886 Allergy status to analgesic agent status: Principal | ICD-10-CM

## 2015-04-02 DIAGNOSIS — J339 Nasal polyp, unspecified: Secondary | ICD-10-CM | POA: Diagnosis not present

## 2015-04-02 DIAGNOSIS — J45998 Other asthma: Secondary | ICD-10-CM

## 2015-04-02 DIAGNOSIS — J45909 Unspecified asthma, uncomplicated: Principal | ICD-10-CM

## 2015-04-02 MED ORDER — PREDNISONE 10 MG PO TABS: ORAL_TABLET | ORAL | Status: DC

## 2015-04-02 NOTE — Patient Instructions (Signed)
Prednisone taper as directed.  Get your flu shot in 1-2 weeks .  Follow med calendar closely and bring to each visit.  Continue on current regimen .  Follow up Dr. Melvyn Novas  In 4 months -in January for Physical.

## 2015-04-02 NOTE — Assessment & Plan Note (Signed)
Mild flare with AR  Patient's medications were reviewed today and patient education was given. Computerized medication calendar was adjusted/completed    Plan  Prednisone taper as directed.  Get your flu shot in 1-2 weeks .  Follow med calendar closely and bring to each visit.  Continue on current regimen .  Follow up Dr. Melvyn Novas  In 4 months -in January for Physical.

## 2015-04-02 NOTE — Progress Notes (Signed)
Subjective:    Patient ID: Antonio Carlson, male    DOB: 24-Nov-1945   MRN: [id number]  Brief patient profile:  34 yowm quit smoking in 1971 with history of Triad Asthma and hyperlipidemia and hbp   HPI  06/12/2014 acute  ov/[name] re: new R arm pain Chief Complaint  Patient presents with  . Follow-up    pain right side back up the shoulder down the arm.  acute onset x 7 days prior to OV  Neck to R shoulder to R hand classic C8 nerve root pattern, worse with ext/ flex of neck. No rash/fever Assoc with slt loss of hand grip strength on R > Pool eval rec steroid rx plus gabapentin > resolved    08/20/2014 f/u ov/[name] re: yearly eval fo rasthma, hyperlipidemia, hbp Chief Complaint  Patient presents with  . Annual Exam    Pt is fasting. He denies any new co's today.   still some numbness in R C8 distribution.  No ex cp, tia, claudication, wheeze or sob  rec Call Dr [name] for any further problems with your hand strength or numbness > improved    02/18/2015 f/u ov/[name] re: triad asthma on laba/ics neb bid  Chief Complaint  Patient presents with  . Follow-up    Pt c/o cough for the past 1-2 months.  Cough is worse in the am and evening and is prod with clear to yellow sputum.    R arm better/ new problem: says doesn't cough much p am laba/ics neb / worse around 3-4 pm  But delays the pm maint dose until 9 pm, taking otc for nose but does not know name >>clort tabs and PPI/pepcid   04/02/2015. Follow up : Triad Asthma    Pt returns for 2 month follow up .  Says his asthma is somewhat improved on current regimen  Pepcid and Chlor tab added to regimen last ov for trigger control for asthma flare  Says it helps but still has aggravating symptoms with on/off wheezing and tightness No chest pain, orthopnea, edema or fever. No discolored mucus or hemoptysis .   We reviewed all his meds and organized them into a med calendar with pt education  Appears to be taking correctly.    Current  Medications, Allergies, Complete Past Medical History, Past Surgical History, Family History, and Social History were reviewed in [employer] record.  ROS  The following are not active complaints unless bolded sore throat, dysphagia, dental problems, itching, sneezing,  nasal congestion or excess/ purulent secretions, ear ache,   fever, chills, sweats, unintended wt loss, pleuritic or exertional cp, hemoptysis,  orthopnea pnd or leg swelling, presyncope, palpitations, heartburn, abdominal pain, anorexia, nausea, vomiting, diarrhea  or change in bowel or urinary habits, change in stools or urine, dysuria,hematuria,  rash, arthralgias, visual complaints, headache, numbness weakness or ataxia or problems with walking or coordination,  change in mood/affect or memory.               Past Medical History:  PVCs.  Syncope  - Holter ordered June 27, 2008 > nl  - EP consult June 27, 2008  OSTEOPENIA (ICD-733.90)  COLONIC POLYPS (ICD-211.3) .......................................[name] Kitchen [name] - see colonoscopy 11/07/10  DIVERTICULOSIS, MILD (ICD-562.10) ASTHMA (ICD-493.90)  - HFA 90% June 11, 2009  ALLERGIC RHINITIS, CHRONIC (ICD-477.9).......................  [name]  - steroid dep until mid oct 2009  - Allergy profile sent January 23, 2010 >> IgE 18.3  - Polypectomy Sept 2012 ...................................................... Meredith Leeds  BENIGN PROSTATIC HYPERTROPHY, HX OF (ICD-V13.8) .[name] Kitchen... 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 [place].......................................................[name] KitchenWert  R C8 radiculopathy 2015 ....................................................... [name]  - CPX  08/20/2014  - Pneumovax 11/2004 and @ age 85 June 11, 2009 and Prevnar 08/14/2013  - Td  07/2005  -Med calendar 12/12/2012 , 03/22/2014 , 04/02/2015   Past Surgical History:  Appendectomy  Colonoscopy   Family History:  heart disease in his father onset at age 69 he was a smoker  Ca brain half brother siblings healthy but their half Mother dementia onset late 71s lived to be 60    Social History:  quit smoking 1971  rarely drink alcohol  Retired          Objective:   Physical Exam  wt 198 May 17, 2008 >  > 192 07/17/11 >  02/05/2012  191> 05/10/2012  188> 08/10/2012 190> 11/14/2012 180>182 12/12/2012 > 03/14/2013 182 > 07/06/2013 185 > 08/14/2013  184 > 01/31/2014  176 >176 .03/22/2014 >  05/31/2014   181 > 06/11/14  179 > 08/20/2014   184> 02/18/2015  181   Vital signs reviewed   Ambulatory healthy appearing wm in no acute distress   HEENT: nl dentition, , and orophanx. Nl external ear canals without cough reflex - clear nasal drainage  Neck without JVD/Nodes/TM  Lungs clear to A and P bilaterally without cough on insp or exp maneuvers   RRR no s3 or murmur or increase in P2 / no edema/ nl pulses carotid and feet no bruits Abd soft and benign with nl excursion in the supine position. No bruits or organomegaly  Ext warm without calf tenderness, cyanosis clubbing or edema  Skin warm and dry without lesions  - no rash or ecchymosis  MS nl gait Neuro : no focal deficits noted.           Labs today reviewed include:      Chemistry      Component Value Date/Time   NA 139 06/03/2014 0905   K 3.8 06/03/2014 0905   CL 101 06/03/2014 0905   CO2 27 06/03/2014 0856   BUN 14 06/03/2014 0905   CREATININE 0.90 06/03/2014 0905      Component Value Date/Time   CALCIUM 9.7 06/03/2014 0856   ALKPHOS 129* 06/03/2014 0856   AST 25 06/03/2014 0856   ALT 29 06/03/2014 0856   BILITOT 0.3 06/03/2014 0856       Lab Results  Component Value Date   CHOL 186 08/20/2014   HDL 44.60 08/20/2014   LDLCALC 127* 08/14/2013   LDLDIRECT 122.0 08/20/2014   TRIG 214.0* 08/20/2014    CHOLHDL 4 08/20/2014                  Assessment & Plan:

## 2015-04-02 NOTE — Progress Notes (Signed)
Chart and office note reviewed in detail  > agree with a/p as outlined    

## 2015-04-09 NOTE — Addendum Note (Signed)
Addended by: Rosana Berger on: 04/09/2015 10:13 AM   Modules accepted: Orders, Medications

## 2015-04-11 ENCOUNTER — Other Ambulatory Visit: Payer: Self-pay | Admitting: Internal Medicine

## 2015-04-28 DIAGNOSIS — Z23 Encounter for immunization: Secondary | ICD-10-CM | POA: Diagnosis not present

## 2015-07-05 ENCOUNTER — Other Ambulatory Visit: Payer: Self-pay | Admitting: Internal Medicine

## 2015-08-01 ENCOUNTER — Ambulatory Visit (INDEPENDENT_AMBULATORY_CARE_PROVIDER_SITE_OTHER): Payer: Medicare Other | Admitting: Internal Medicine

## 2015-08-01 ENCOUNTER — Other Ambulatory Visit (INDEPENDENT_AMBULATORY_CARE_PROVIDER_SITE_OTHER): Payer: Medicare Other

## 2015-08-01 ENCOUNTER — Encounter: Payer: Self-pay | Admitting: Internal Medicine

## 2015-08-01 ENCOUNTER — Ambulatory Visit (INDEPENDENT_AMBULATORY_CARE_PROVIDER_SITE_OTHER)
Admission: RE | Admit: 2015-08-01 | Discharge: 2015-08-01 | Disposition: A | Discharge status: Home / Self Care | Payer: Medicare Other | Source: Ambulatory Visit | Attending: Internal Medicine | Admitting: Internal Medicine

## 2015-08-01 VITALS — BP 132/80 | HR 75 | Temp 97.7°F | Ht 68.0 in | Wt 178.4 lb

## 2015-08-01 DIAGNOSIS — J45909 Unspecified asthma, uncomplicated: Principal | ICD-10-CM

## 2015-08-01 DIAGNOSIS — J45998 Other asthma: Secondary | ICD-10-CM

## 2015-08-01 DIAGNOSIS — Z886 Allergy status to analgesic agent status: Principal | ICD-10-CM

## 2015-08-01 DIAGNOSIS — E785 Hyperlipidemia, unspecified: Secondary | ICD-10-CM

## 2015-08-01 DIAGNOSIS — Z23 Encounter for immunization: Secondary | ICD-10-CM | POA: Diagnosis not present

## 2015-08-01 DIAGNOSIS — I10 Essential (primary) hypertension: Secondary | ICD-10-CM

## 2015-08-01 DIAGNOSIS — J339 Nasal polyp, unspecified: Secondary | ICD-10-CM | POA: Diagnosis not present

## 2015-08-01 DIAGNOSIS — J309 Allergic rhinitis, unspecified: Secondary | ICD-10-CM

## 2015-08-01 LAB — BASIC METABOLIC PANEL
BUN: 21 mg/dL (ref 6–23)
CHLORIDE: 104 meq/L (ref 96–112)
CO2: 29 mEq/L (ref 19–32)
Calcium: 9.8 mg/dL (ref 8.4–10.5)
Creatinine, Ser: 1.03 mg/dL (ref 0.40–1.50)
GFR: 75.45 mL/min (ref >60.00)
GLUCOSE: 98 mg/dL (ref 70–99)
POTASSIUM: 4.8 meq/L (ref 3.5–5.1)
SODIUM: 142 meq/L (ref 135–145)

## 2015-08-01 LAB — LIPID PANEL
CHOL/HDL RATIO: 5
Cholesterol: 209 mg/dL — ABNORMAL HIGH (ref 0–200)
HDL: 45.3 mg/dL (ref >39.00)
LDL CALC: 144 mg/dL — AB (ref 0–99)
NONHDL: 163.95
TRIGLYCERIDES: 101 mg/dL (ref 0.0–149.0)
VLDL: 20.2 mg/dL (ref 0.0–40.0)

## 2015-08-01 LAB — TSH: TSH: 1.48 u[IU]/mL (ref 0.35–4.50)

## 2015-08-01 LAB — CBC WITH DIFFERENTIAL/PLATELET
BASOS PCT: 0.5 % (ref 0.0–3.0)
Basophils Absolute: 0.1 10*3/uL (ref 0.0–0.1)
EOS PCT: 7.6 % — AB (ref 0.0–5.0)
Eosinophils Absolute: 1.1 10*3/uL — ABNORMAL HIGH (ref 0.0–0.7)
HEMATOCRIT: 52 % (ref 39.0–52.0)
HEMOGLOBIN: 17.1 g/dL — AB (ref 13.0–17.0)
LYMPHS PCT: 12 % (ref 12.0–46.0)
Lymphs Abs: 1.8 10*3/uL (ref 0.7–4.0)
MCHC: 32.8 g/dL (ref 30.0–36.0)
MCV: 85.6 fl (ref 78.0–100.0)
MONOS PCT: 7.8 % (ref 3.0–12.0)
Monocytes Absolute: 1.2 10*3/uL — ABNORMAL HIGH (ref 0.1–1.0)
Neutro Abs: 10.8 10*3/uL — ABNORMAL HIGH (ref 1.4–7.7)
Neutrophils Relative %: 72.1 % (ref 43.0–77.0)
Platelets: 294 10*3/uL (ref 150.0–400.0)
RBC: 6.08 Mil/uL — ABNORMAL HIGH (ref 4.22–5.81)
RDW: 14.9 % (ref 11.5–15.5)
WBC: 15 10*3/uL — AB (ref 4.0–10.5)

## 2015-08-01 LAB — HEPATIC FUNCTION PANEL
ALBUMIN: 4.4 g/dL (ref 3.5–5.2)
ALT: 20 U/L (ref 0–53)
AST: 21 U/L (ref 0–37)
Alkaline Phosphatase: 93 U/L (ref 39–117)
BILIRUBIN DIRECT: 0.1 mg/dL (ref 0.0–0.3)
TOTAL PROTEIN: 7.5 g/dL (ref 6.0–8.3)
Total Bilirubin: 0.7 mg/dL (ref 0.2–1.2)

## 2015-08-01 MED ORDER — LORAZEPAM 0.5 MG PO TABS: 0.5000 mg | ORAL_TABLET | Freq: Three times a day (TID) | ORAL | Status: DC | PRN

## 2015-08-01 MED ORDER — PREDNISONE 10 MG PO TABS: ORAL_TABLET | ORAL | Status: DC

## 2015-08-01 MED ORDER — ALBUTEROL SULFATE HFA 108 (90 BASE) MCG/ACT IN AERS: INHALATION_SPRAY | RESPIRATORY_TRACT | Status: DC

## 2015-08-01 NOTE — Progress Notes (Signed)
Subjective:    Patient ID: Antonio Carlson, male    DOB: [birth year]   MRN: [id number]  Brief patient profile:  35 yowm quit smoking in 1971 with history of Triad Asthma and hyperlipidemia and hbp   HPI  06/12/2014 acute  ov/Antonio Carlson re: new R arm pain Chief Complaint  Patient presents with  . Follow-up    pain right side back up the shoulder down the arm.  acute onset x 7 days prior to OV  Neck to R shoulder to R hand classic C8 nerve root pattern, worse with ext/ flex of neck. No rash/fever Assoc with slt loss of hand grip strength on R > Pool eval rec steroid rx plus gabapentin > resolved    08/20/2014 f/u ov/Antonio Carlson re: yearly eval fo rasthma, hyperlipidemia, hbp Chief Complaint  Patient presents with  . Annual Exam    Pt is fasting. He denies any new co's today.   still some numbness in R C8 distribution.  No ex cp, tia, claudication, wheeze or sob  rec Call Dr [name] for any further problems with your hand strength or numbness > improved    02/18/2015 f/u ov/Antonio Carlson re: triad asthma on laba/ics neb bid  Chief Complaint  Patient presents with  . Follow-up    Pt c/o cough for the past 1-2 months.  Cough is worse in the am and evening and is prod with clear to yellow sputum.    R arm better/ new problem: says doesn't cough much p am laba/ics neb / worse around 3-4 pm  But delays the pm maint dose until 9 pm, taking otc for nose but does not know name >>clort tabs and PPI/pepcid   04/02/2015. Follow up : Triad Asthma    Pt returns for 2 month follow up .  Says his asthma is somewhat improved on current regimen  Pepcid and Chlor tab added to regimen last ov for trigger control for asthma flare  Says it helps but still has aggravating symptoms with on/off wheezing and tightness rec Prednisone taper as directed.  Get your flu shot in 1-2 weeks .  Follow med calendar closely and bring to each visit.  Continue on current regimen .     08/01/2015  f/u ov/Antonio Carlson re: triad asthma/ chronic  rhinitis  Chief Complaint  Patient presents with  . Annual Exam    Pt states overall doing well. He does have some rhinitis and sinus congestion "I keep it all the time"- worse x 1 month. He has had some cough- prod with clear sputum, relates to PND.   does fine on flat surface, worse breathing out doors and up hills x  Fall  2016 / never uses ventolin   No obvious day to day or daytime variability or assoc chronic cough or cp or chest tightness, subjective wheeze or overt   hb symptoms. No unusual exp hx or h/o childhood pna/ asthma or knowledge of premature birth.  Sleeping ok without nocturnal  or early am exacerbation  of respiratory  c/o's or need for noct saba. Also denies any obvious fluctuation of symptoms with weather or environmental changes or other aggravating or alleviating factors except as outlined above   Current Medications, Allergies, Complete Past Medical History, Past Surgical History, Family History, and Social History were reviewed in [employer] record.  ROS  The following are not active complaints unless bolded sore throat, dysphagia, dental problems, itching, sneezing,  nasal congestion or excess/ purulent secretions, ear ache,  fever, chills, sweats, unintended wt loss, classically pleuritic or exertional cp, hemoptysis,  orthopnea pnd or leg swelling, presyncope, palpitations, abdominal pain, anorexia, nausea, vomiting, diarrhea  or change in bowel or bladder habits, change in stools or urine, dysuria,hematuria,  rash, arthralgias, visual complaints, headache, numbness, weakness or ataxia or problems with walking or coordination,  change in mood/affect or memory.         Past Medical History:  PVCs.  Syncope  - Holter ordered June 27, 2008 > nl  - EP consult June 27, 2008  OSTEOPENIA (ICD-733.90)  COLONIC POLYPS (ICD-211.3) .......................................[name] Kitchen [name] - see colonoscopy 11/07/10  DIVERTICULOSIS, MILD  (ICD-562.10) ASTHMA (ICD-493.90)  - HFA 90% June 11, 2009  ALLERGIC RHINITIS, CHRONIC (ICD-477.9).......................  [name]  - steroid dep until mid oct 2009  - Allergy profile sent January 23, 2010 >> IgE 18.3  - Polypectomy Sept 2012 ...................................................... [name]  BENIGN PROSTATIC HYPERTROPHY, HX OF (ICD-V13.8) .[name] Kitchen... 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 [place].......................................................[name] KitchenWert  R C8 radiculopathy 2015 ....................................................... [name]  - CPX 08/01/2015  - Pneumovax 11/2004 and @ age 70 June 11, 2009 and Prevnar 08/14/2013  - Td 07/2005   And  08/01/2015  -Med calendar 12/12/2012 , 03/22/2014 , 04/02/2015   Past Surgical History:  Appendectomy  Colonoscopy   Family History:  heart disease in his father onset at age 68 he was a smoker  Ca brain half brother siblings healthy but their half Mother dementia onset late 7s lived to be 68    Social History:  quit smoking 1971  rarely drink alcohol  Retired          Objective:   Physical Exam  wt 198 May 17, 2008 >  > 192 07/17/11 >  02/05/2012  191> 05/10/2012  188> 08/10/2012 190> 11/14/2012 180>182 12/12/2012 > 03/14/2013 182 > 07/06/2013 185 > 08/14/2013  184 > 01/31/2014  176 >176 .03/22/2014 >  05/31/2014   181 > 06/11/14  179 > 08/20/2014   184> 02/18/2015  181 > 08/01/2015  178   Vital signs reviewed   Ambulatory healthy appearing wm in no acute distress   HEENT: nl dentition, , and oropharynx. Nl external ear canals without cough reflex - clear nasal drainage  Neck without JVD/Nodes/TM  Lungs clear to A and P bilaterally without cough on insp or exp maneuvers   RRR no s3 or murmur or increase in P2 / no edema/ nl pulses carotid and feet  no bruits Abd soft and benign with nl excursion in the supine position. No bruits or organomegaly  Ext warm without calf tenderness, cyanosis clubbing or edema  Skin warm and dry without lesions  - no rash or ecchymosis  MS nl gait Neuro : no focal deficits noted. GU/Recatal Per Dr Towanda Octave ordered/ reviewed:      Chemistry      Component Value Date/Time   NA 142 08/01/2015 0933   K 4.8 08/01/2015 0933   CL 104 08/01/2015 0933   CO2 29 08/01/2015 0933   BUN 21 08/01/2015 0933   CREATININE 1.03 08/01/2015 0933      Component Value Date/Time   CALCIUM 9.8 08/01/2015 0933   ALKPHOS 93 08/01/2015 0933   AST 21 08/01/2015 0933   ALT 20 08/01/2015 0933   BILITOT 0.7 08/01/2015 0933        Lab Results  Component Value Date  WBC 15.0* 08/01/2015   HGB 17.1* 08/01/2015   HCT 52.0 08/01/2015   MCV 85.6 08/01/2015   PLT 294.0 08/01/2015        Lab Results  Component Value Date   TSH 1.48 08/01/2015            Assessment & Plan:

## 2015-08-01 NOTE — Patient Instructions (Addendum)
Tetanus shot today   Please remember to go to the lab and x-ray department downstairs for your tests - we will call you with the results when they are available.  Try using the albuterol 2 pffs before an activity you know will make you short of breath.  Prednisone 10 mg take  4 each am x 2 days,   2 each am x 2 days,  1 each am x 2 days and stop   Change the flonase to where you twice daily and remember the afrin.  Only use your albuterol as a rescue medication to be used if you can't catch your breath by resting or doing a relaxed purse lip breathing pattern.  - The less you use it, the better it will work when you need it. - Ok to use up to 2 puffs  every 4 hours if you must but call for immediate appointment if use goes up over your usual need - Don't leave home without it !!  (think of it like the spare tire for your car)      Please schedule a follow up office visit in 6 weeks, call sooner if needed with pfts

## 2015-08-02 ENCOUNTER — Telehealth: Payer: Self-pay

## 2015-08-02 NOTE — Telephone Encounter (Signed)
Patient notified of Dr. Gustavus Bryant recommendations. Verbalized understanding. Nothing further needed.

## 2015-08-02 NOTE — Telephone Encounter (Signed)
Can use kaopectate prn otc  Ok to take pred as rec Clear liquid diet until diarrhea resolves then advance to [employer] but avoid dairy products/ salads until all better

## 2015-08-02 NOTE — Telephone Encounter (Signed)
Called pt to relay results from yesterday's ov.  Pt states that since receiving tdap yesterday pt has had several episodes of diarrhea, starting last night and continuing through today.  Pt did not start pred taper prescribed until this morning.  Denies fever.  Pt has been increasing fluids and taking pepto bismol.  Pt requesting something be called in for this.    MW please advise on recs.  Thanks!

## 2015-08-05 ENCOUNTER — Encounter: Payer: Self-pay | Admitting: Internal Medicine

## 2015-08-05 NOTE — Assessment & Plan Note (Signed)
Adequate control on present rx, reviewed > no change in rx needed   

## 2015-08-05 NOTE — Assessment & Plan Note (Signed)
target < 130 male, pos fm hx, h/o smoking   - 08/11/2011 changed to 40 mg per day    Lab Results  Component Value Date   CHOL 209* 08/01/2015   HDL 45.30 08/01/2015   LDLCALC 144* 08/01/2015   LDLDIRECT 122.0 08/20/2014   TRIG 101.0 08/01/2015   CHOLHDL 5 08/01/2015   Reasonably Adequate control on present rx, reviewed > no change in rx needed

## 2015-08-05 NOTE — Assessment & Plan Note (Signed)
-   s/p repeat sinus surgery 03/28/11  Reviewed approp prns > f/u ent

## 2015-08-05 NOTE — Assessment & Plan Note (Addendum)
Adequate control on present rx, reviewed > no change in rx needed  But ok to try saba before ex to see if makes any difference in doe and plan on return for full pfts to sort out chronic doe  I had an extended discussion with the patient reviewing all relevant studies completed to date and  lasting 15 to 20 minutes of a 25 minute visit    Each maintenance medication was reviewed in detail including most importantly the difference between maintenance and prns and under what circumstances the prns are to be triggered using an action plan format that is not reflected in the computer generated alphabetically organized AVS but trather by a customized med calendar that reflects the AVS meds with confirmed 100% correlation.   Please see instructions for details which were reviewed in writing and the patient given a copy highlighting the part that I personally wrote and discussed at today's ov.

## 2015-08-23 ENCOUNTER — Other Ambulatory Visit: Payer: Self-pay | Admitting: Internal Medicine

## 2015-09-16 ENCOUNTER — Other Ambulatory Visit: Payer: Self-pay

## 2015-09-16 ENCOUNTER — Ambulatory Visit (INDEPENDENT_AMBULATORY_CARE_PROVIDER_SITE_OTHER): Payer: Medicare Other | Admitting: Internal Medicine

## 2015-09-16 ENCOUNTER — Encounter: Payer: Self-pay | Admitting: Internal Medicine

## 2015-09-16 VITALS — BP 134/82 | HR 66 | Ht 68.0 in | Wt 182.0 lb

## 2015-09-16 DIAGNOSIS — J45998 Other asthma: Secondary | ICD-10-CM

## 2015-09-16 DIAGNOSIS — J45909 Unspecified asthma, uncomplicated: Principal | ICD-10-CM

## 2015-09-16 DIAGNOSIS — J339 Nasal polyp, unspecified: Secondary | ICD-10-CM | POA: Diagnosis not present

## 2015-09-16 DIAGNOSIS — Z886 Allergy status to analgesic agent status: Principal | ICD-10-CM

## 2015-09-16 LAB — PULMONARY FUNCTION TEST
DL/VA % pred: 107 %
DL/VA: 4.81 ml/min/mmHg/L
DLCO COR % PRED: 81 %
DLCO UNC: 24.21 ml/min/mmHg
DLCO cor: 24.14 ml/min/mmHg
DLCO unc % pred: 81 %
FEF 25-75 POST: 1.22 L/s
FEF 25-75 Pre: 0.57 L/sec
FEF2575-%Change-Post: 113 %
FEF2575-%Pred-Post: 55 %
FEF2575-%Pred-Pre: 26 %
FEV1-%CHANGE-POST: 44 %
FEV1-%Pred-Post: 61 %
FEV1-%Pred-Pre: 42 %
FEV1-POST: 1.81 L
FEV1-PRE: 1.25 L
FEV1FVC-%Change-Post: 23 %
FEV1FVC-%PRED-PRE: 62 %
FEV6-%Change-Post: 21 %
FEV6-%PRED-POST: 83 %
FEV6-%PRED-PRE: 68 %
FEV6-POST: 3.14 L
FEV6-PRE: 2.58 L
FEV6FVC-%CHANGE-POST: 4 %
FEV6FVC-%PRED-POST: 103 %
FEV6FVC-%PRED-PRE: 99 %
FVC-%CHANGE-POST: 16 %
FVC-%PRED-POST: 80 %
FVC-%PRED-PRE: 68 %
FVC-POST: 3.22 L
FVC-PRE: 2.76 L
PRE FEV6/FVC RATIO: 94 %
Post FEV1/FVC ratio: 56 %
Post FEV6/FVC ratio: 97 %
Pre FEV1/FVC ratio: 45 %
RV % PRED: 187 %
RV: 4.46 L
TLC % PRED: 113 %
TLC: 7.53 L

## 2015-09-16 MED ORDER — PREDNISONE 10 MG PO TABS: ORAL_TABLET | ORAL | Status: DC

## 2015-09-16 NOTE — Patient Instructions (Signed)
If need for ventolin goes way up or nasal obstruction does not respond to afrin rec Prednisone 10 mg take  4 each am x 2 days,   2 each am x 2 days,  1 each am x 2 days and stop  See calendar for specific medication instructions and bring it back for each and every office visit for every healthcare provider you see.  Without it,  you may not receive the best quality medical care that we feel you deserve.  You will note that the calendar groups together  your maintenance  medications that are timed at particular times of the day.  Think of this as your checklist for what your doctor has instructed you to do until your next evaluation to see what benefit  there is  to staying on a consistent group of medications intended to keep you well.  The other group at the bottom is entirely up to you to use as you see fit  for specific symptoms that may arise between visits that require you to treat them on an as needed basis.  Think of this as your action plan or "what if" list.   Separating the top medications from the bottom group is fundamental to providing you adequate care going forward.    Please schedule a follow up visit in 3 months but call sooner if needed

## 2015-09-16 NOTE — Assessment & Plan Note (Signed)
-  hfa 90% p coaching 11/14/2012  - PFT's  09/16/2015  FEV1 1.81 (61 % ) ratio 56  p 44 % improvement from saba with DLCO  81/81c % corrects to 107  % for alv volume  Done prior to any am meds   All goals of chronic asthma control met including optimal (though certainly not nl) function and elimination of symptoms with minimal need for rescue therapy.  Contingencies discussed in full including contacting this office immediately if not controlling the symptoms using the rule of two's.     Will add short course of prednisone prn symptoms refractory to saba or afrin using the same principle   I had an extended discussion with the patient reviewing all relevant studies completed to date and  lasting 15 to 20 minutes of a 25 minute visit    Each maintenance medication was reviewed in detail including most importantly the difference between maintenance and prns and under what circumstances the prns are to be triggered using an action plan format that is not reflected in the computer generated alphabetically organized AVS but trather by a customized med calendar that reflects the AVS meds with confirmed 100% correlation.   Please see instructions for details which were reviewed in writing and the patient given a copy highlighting the part that I personally wrote and discussed at today's ov.

## 2015-09-16 NOTE — Progress Notes (Signed)
PFT done today. 

## 2015-09-16 NOTE — Progress Notes (Signed)
Subjective:    Patient ID: Antonio Carlson, male    DOB: 05/05/1946   MRN: [id number]  Brief patient profile:  70 yowm quit smoking in 1971 with history of Triad Asthma and hyperlipidemia and hbp   HPI  06/12/2014 acute  ov/Herny Scurlock re: new R arm pain Chief Complaint  Patient presents with  . Follow-up    pain right side back up the shoulder down the arm.  acute onset x 7 days prior to OV  Neck to R shoulder to R hand classic C8 nerve root pattern, worse with ext/ flex of neck. No rash/fever Assoc with slt loss of hand grip strength on R > Pool eval rec steroid rx plus gabapentin > resolved    08/20/2014 f/u ov/Donni Oglesby re: yearly eval fo rasthma, hyperlipidemia, hbp Chief Complaint  Patient presents with  . Annual Exam    Pt is fasting. He denies any new co's today.   still some numbness in R C8 distribution.  No ex cp, tia, claudication, wheeze or sob  rec Call Dr [name] for any further problems with your hand strength or numbness > improved    02/18/2015 f/u ov/Zariya Minner re: triad asthma on laba/ics neb bid  Chief Complaint  Patient presents with  . Follow-up    Pt c/o cough for the past 1-2 months.  Cough is worse in the am and evening and is prod with clear to yellow sputum.    R arm better/ new problem: says doesn't cough much p am laba/ics neb / worse around 70 pm  But delays the pm maint dose until 9 pm, taking otc for nose but does not know name >>clort tabs and PPI/pepcid   04/02/2015. Follow up : [facility] Asthma    Pt returns for 2 month follow up .  Says his asthma is somewhat improved on current regimen  Pepcid and Chlor tab added to regimen last ov for trigger control for asthma flare  Says it helps but still has aggravating symptoms with on/off wheezing and tightness rec Prednisone taper as directed.  Get your flu shot in 1-2 weeks .  Follow med calendar closely and bring to each visit.  Continue on current regimen .     08/01/2015  f/u ov/Alisha Burgo re: triad asthma/ chronic  rhinitis  Chief Complaint  Patient presents with  . Annual Exam    Pt states overall doing well. He does have some rhinitis and sinus congestion "I keep it all the time"- worse x 1 month. He has had some cough- prod with clear sputum, relates to PND.   does fine on flat surface, worse breathing out doors and up hills x  Fall  2016 / never uses ventolin  rec Tetanus shot today  Please remember to go to the lab and x-ray department downstairs for your tests - we will call you with the results when they are available. Try using the albuterol 2 pffs before an activity you know will make you short of breath. Prednisone 10 mg take  4 each am x 2 days,   2 each am x 2 days,  1 each am x 2 days and stop  Change the flonase to where you twice daily and remember the afrin. Only use your albuterol as a rescue medication    09/16/2015  f/u ov/Tori Dattilio re: triad asthma/ maint rx bud/perf and rare saba  Chief Complaint  Patient presents with  . Follow-up    PFT done today. Breathing is doing well. He has  only used albuterol inhaler x 1 since his last visit.   only once over did it carrying a puppy up an incline   No obvious day to day or daytime variability or assoc chronic cough or cp or chest tightness, subjective wheeze or overt   hb symptoms. No unusual exp hx or h/o childhood pna/ asthma or knowledge of premature birth.  Sleeping ok without nocturnal  or early am exacerbation  of respiratory  c/o's or need for noct saba. Also denies any obvious fluctuation of symptoms with weather or environmental changes or other aggravating or alleviating factors except as outlined above   Current Medications, Allergies, Complete Past Medical History, Past Surgical History, Family History, and Social History were reviewed in [employer] record.  ROS  The following are not active complaints unless bolded sore throat, dysphagia, dental problems, itching, sneezing,  nasal congestion or excess/  purulent secretions, ear ache,   fever, chills, sweats, unintended wt loss, classically pleuritic or exertional cp, hemoptysis,  orthopnea pnd or leg swelling, presyncope, palpitations, abdominal pain, anorexia, nausea, vomiting, diarrhea  or change in bowel or bladder habits, change in stools or urine, dysuria,hematuria,  rash, arthralgias, visual complaints, headache, numbness, weakness or ataxia or problems with walking or coordination,  change in mood/affect or memory.         Past Medical History:  PVCs.  Syncope  - Holter ordered June 27, 2008 > nl  - EP consult June 27, 2008  OSTEOPENIA (ICD-733.90)  COLONIC POLYPS (ICD-211.3) .......................................[name] Kitchen [name] - see colonoscopy 11/07/10  DIVERTICULOSIS, MILD (ICD-562.10) ASTHMA (ICD-493.90)  - HFA 90% June 11, 2009  ALLERGIC RHINITIS, CHRONIC (ICD-477.9).......................  [name]  - steroid dep until mid oct 2009  - Allergy profile sent January 23, 2010 >> IgE 18.3  - Polypectomy Sept 2012 ...................................................... [name]  BENIGN PROSTATIC HYPERTROPHY, HX OF (ICD-V13.8) .[name] Kitchen... 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 [place].......................................................[name] KitchenWert  R C8 radiculopathy 2015 ....................................................... [name]  - CPX 08/01/2015  - Pneumovax 11/2004 and @ age 68 June 11, 2009 and Prevnar 08/14/2013  - Td 07/2005   And  08/01/2015  -Med calendar 12/12/2012 , 03/22/2014 , 04/02/2015   Past Surgical History:  Appendectomy  Colonoscopy   Family History:  heart disease in his father onset at age 72 he was a smoker  Ca brain half brother siblings healthy but their half Mother dementia onset late 72s lived to be 65    Social History:   quit smoking 1971  rarely drink alcohol  Retired          Objective:   Physical Exam  wt 198 May 17, 2008 >  > 192 07/17/11 >  02/05/2012  191> 05/10/2012  188> 08/10/2012 190> 11/14/2012 180>182 12/12/2012 > 03/14/2013 182 > 07/06/2013 185 > 08/14/2013  184 > 01/31/2014  176 >176 .03/22/2014 >  05/31/2014   181 > 06/11/14  179 > 08/20/2014   184> 02/18/2015  181 > 08/01/2015  178 > 09/16/2015 182   Vital signs reviewed   Ambulatory healthy appearing wm in no acute distress   HEENT: nl dentition, , and oropharynx. Nl external ear canals without cough reflex - clear nasal drainage  Neck without JVD/Nodes/TM  Lungs clear to A and P bilaterally without cough on insp or exp maneuvers   RRR no s3 or murmur or increase in P2 / no edema/ nl pulses carotid and  feet no bruits Abd soft and benign with nl excursion in the supine position. No bruits or organomegaly  Ext warm without calf tenderness, cyanosis clubbing or edema  Skin warm and dry without lesions  - no rash or ecchymosis  MS nl gait    Labs ordered/ reviewed:      Chemistry      Component Value Date/Time   NA 142 08/01/2015 0933   K 4.8 08/01/2015 0933   CL 104 08/01/2015 0933   CO2 29 08/01/2015 0933   BUN 21 08/01/2015 0933   CREATININE 1.03 08/01/2015 0933      Component Value Date/Time   CALCIUM 9.8 08/01/2015 0933   ALKPHOS 93 08/01/2015 0933   AST 21 08/01/2015 0933   ALT 20 08/01/2015 0933   BILITOT 0.7 08/01/2015 0933        Lab Results  Component Value Date   WBC 15.0* 08/01/2015   HGB 17.1* 08/01/2015   HCT 52.0 08/01/2015   MCV 85.6 08/01/2015   PLT 294.0 08/01/2015        Lab Results  Component Value Date   TSH 1.48 08/01/2015            Assessment & Plan:

## 2015-09-25 ENCOUNTER — Other Ambulatory Visit: Payer: Self-pay | Admitting: Internal Medicine

## 2015-09-27 ENCOUNTER — Telehealth: Payer: Self-pay | Admitting: Internal Medicine

## 2015-09-27 MED ORDER — FORMOTEROL FUMARATE 20 MCG/2ML IN NEBU: INHALATION_SOLUTION | RESPIRATORY_TRACT | Status: DC

## 2015-09-27 NOTE — Telephone Encounter (Signed)
Spoke with pt. He needs a refill of Perforomist sent in the dx code attached. Rx has been sent in. Nothing further was needed.

## 2015-11-27 ENCOUNTER — Other Ambulatory Visit: Payer: Self-pay | Admitting: Internal Medicine

## 2015-12-16 ENCOUNTER — Ambulatory Visit (INDEPENDENT_AMBULATORY_CARE_PROVIDER_SITE_OTHER): Payer: Medicare Other | Admitting: Internal Medicine

## 2015-12-16 ENCOUNTER — Encounter: Payer: Self-pay | Admitting: Internal Medicine

## 2015-12-16 VITALS — BP 130/78 | HR 67 | Ht 68.0 in | Wt 184.8 lb

## 2015-12-16 DIAGNOSIS — J45998 Other asthma: Secondary | ICD-10-CM | POA: Diagnosis not present

## 2015-12-16 DIAGNOSIS — J3089 Other allergic rhinitis: Secondary | ICD-10-CM

## 2015-12-16 DIAGNOSIS — J45909 Unspecified asthma, uncomplicated: Principal | ICD-10-CM

## 2015-12-16 DIAGNOSIS — I10 Essential (primary) hypertension: Secondary | ICD-10-CM | POA: Diagnosis not present

## 2015-12-16 DIAGNOSIS — E785 Hyperlipidemia, unspecified: Secondary | ICD-10-CM

## 2015-12-16 DIAGNOSIS — J339 Nasal polyp, unspecified: Secondary | ICD-10-CM | POA: Diagnosis not present

## 2015-12-16 DIAGNOSIS — Z886 Allergy status to analgesic agent status: Principal | ICD-10-CM

## 2015-12-16 NOTE — Patient Instructions (Signed)
No change medications  See calendar for specific medication instructions and bring it back for each and every office visit for every healthcare provider you see.  Without it,  you may not receive the best quality medical care that we feel you deserve.  You will note that the calendar groups together  your maintenance  medications that are timed at particular times of the day.  Think of this as your checklist for what your doctor has instructed you to do until your next evaluation to see what benefit  there is  to staying on a consistent group of medications intended to keep you well.  The other group at the bottom is entirely up to you to use as you see fit  for specific symptoms that may arise between visits that require you to treat them on an as needed basis.  Think of this as your action plan or "what if" list.   Separating the top medications from the bottom group is fundamental to providing you adequate care going forward.

## 2015-12-16 NOTE — Progress Notes (Signed)
Subjective:    Patient ID: Antonio Carlson, male    DOB: 05/05/1946   MRN: [id number]  Brief patient profile:  70 yowm quit smoking in 1971 with history of Triad Asthma and hyperlipidemia and hbp   HPI  06/12/2014 acute  ov/Antonio Carlson re: new R arm pain Chief Complaint  Patient presents with  . Follow-up    pain right side back up the shoulder down the arm.  acute onset x 7 days prior to OV  Neck to R shoulder to R hand classic C8 nerve root pattern, worse with ext/ flex of neck. No rash/fever Assoc with slt loss of hand grip strength on R > Pool eval rec steroid rx plus gabapentin > resolved    08/20/2014 f/u ov/Antonio Carlson re: yearly eval fo rasthma, hyperlipidemia, hbp Chief Complaint  Patient presents with  . Annual Exam    Pt is fasting. He denies any new co's today.   still some numbness in R C8 distribution.  No ex cp, tia, claudication, wheeze or sob  rec Call Dr [name] for any further problems with your hand strength or numbness > improved    02/18/2015 f/u ov/Antonio Carlson re: triad asthma on laba/ics neb bid  Chief Complaint  Patient presents with  . Follow-up    Pt c/o cough for the past 1-2 months.  Cough is worse in the am and evening and is prod with clear to yellow sputum.    R arm better/ new problem: says doesn't cough much p am laba/ics neb / worse around 3-4 pm  But delays the pm maint dose until 9 pm, taking otc for nose but does not know name >>clort tabs and PPI/pepcid   04/02/2015. Follow up : [facility] Asthma    Pt returns for 2 month follow up .  Says his asthma is somewhat improved on current regimen  Pepcid and Chlor tab added to regimen last ov for trigger control for asthma flare  Says it helps but still has aggravating symptoms with on/off wheezing and tightness rec Prednisone taper as directed.  Get your flu shot in 1-2 weeks .  Follow med calendar closely and bring to each visit.  Continue on current regimen .     08/01/2015  f/u ov/Antonio Carlson re: triad asthma/ chronic  rhinitis  Chief Complaint  Patient presents with  . Annual Exam    Pt states overall doing well. He does have some rhinitis and sinus congestion "I keep it all the time"- worse x 1 month. He has had some cough- prod with clear sputum, relates to PND.   does fine on flat surface, worse breathing out doors and up hills x  Fall  2016 / never uses ventolin  rec Tetanus shot today  Please remember to go to the lab and x-ray department downstairs for your tests - we will call you with the results when they are available. Try using the albuterol 2 pffs before an activity you know will make you short of breath. Prednisone 10 mg take  4 each am x 2 days,   2 each am x 2 days,  1 each am x 2 days and stop  Change the flonase to where you twice daily and remember the afrin. Only use your albuterol as a rescue medication    09/16/2015  f/u ov/Antonio Carlson re: triad asthma/ maint rx bud/perf and rare saba  Chief Complaint  Patient presents with  . Follow-up    PFT done today. Breathing is doing well. He has  only used albuterol inhaler x 1 since his last visit.   only once over did it carrying a puppy up an incline  rec If need for ventolin goes way up or nasal obstruction does not respond to afrin rec Prednisone 10 mg take  4 each am x 2 days,   2 each am x 2 days,  1 each am x 2 days and stop     12/16/2015  f/u ov/Antonio Carlson re: hbp/hyperlidpidemia/ triad asthma/ on prn pred last used one month prior to OV  / following med calendar nicely including action plans Chief Complaint  Patient presents with  . Follow-up    Breathing is doing well. He has not had to use albuterol since the last visit. Last pred taper was approx 1 month ago. No new co's today.    Continues mostly with nasal congestion > cough or sob/wheeze on flonase bid and prn h1 ok control/ no need for saba  - Not limited by breathing from desired activities    No obvious patterns in day to day or daytime variability or assoc excess/ purulent sputum  or mucus plugs  or cp or chest tightness, subjective wheeze or overt   hb symptoms. No unusual exp hx or h/o childhood pna/ asthma or knowledge of premature birth.  Sleeping ok without nocturnal  or early am exacerbation  of respiratory  c/o's or need for noct saba. Also denies any obvious fluctuation of symptoms with weather or environmental changes or other aggravating or alleviating factors except as outlined above   Current Medications, Allergies, Complete Past Medical History, Past Surgical History, Family History, and Social History were reviewed in [employer] record.  ROS  The following are not active complaints unless bolded sore throat, dysphagia, dental problems, itching, sneezing,  nasal congestion or excess/ purulent secretions, ear ache,   fever, chills, sweats, unintended wt loss, classically pleuritic or exertional cp, hemoptysis,  orthopnea pnd or leg swelling, presyncope, palpitations, abdominal pain, anorexia, nausea, vomiting, diarrhea  or change in bowel or bladder habits, change in stools or urine, dysuria,hematuria,  rash, arthralgias, visual complaints, headache, numbness, weakness or ataxia or problems with walking or coordination,  change in mood/affect or memory.         Past Medical History:  PVCs.  Syncope  - Holter ordered June 27, 2008 > nl  - EP consult June 27, 2008  OSTEOPENIA (ICD-733.90)  COLONIC POLYPS (ICD-211.3) .......................................[name] Kitchen [name] - see colonoscopy 11/07/10  DIVERTICULOSIS, MILD (ICD-562.10) ASTHMA (ICD-493.90)  - HFA 90% June 11, 2009  ALLERGIC RHINITIS, CHRONIC (ICD-477.9).......................  [name]  - steroid dep until mid oct 2009  - Allergy profile sent January 23, 2010 >> IgE 18.3  - Polypectomy Sept 2012 ...................................................... [name]  BENIGN PROSTATIC HYPERTROPHY, HX OF (ICD-V13.8) .[name] Kitchen... [name] NEPHROLITHIASIS (ICD-592.0)  HYPERLIPIDEMIA  (ICD-272.4) target < 130 male, pos fm hx, h/l smoking  R Shoulder pain..................................................................... [place].......................................................[name] KitchenWert  R C8 radiculopathy 2015 ....................................................... [name]  - CPX 08/01/2015  - Pneumovax 11/2004 and @ age 31 June 11, 2009 and Prevnar 08/14/2013  - Td 07/2005   And  08/01/2015  -Med calendar 12/12/2012 , 03/22/2014 , 04/02/2015   Past Surgical History:  Appendectomy  Colonoscopy   Family History:  heart disease in his father onset at age 4 he was a smoker  Ca brain half brother siblings healthy but their half Mother dementia onset late 36s lived to be 67    Social History:  quit smoking  1971  rarely drink alcohol  Retired          Objective:   Physical Exam  wt 198 May 17, 2008 >  > 192 07/17/11 >  02/05/2012  191> 05/10/2012  188> 08/10/2012 190> 11/14/2012 180>182 12/12/2012 > 03/14/2013 182 > 07/06/2013 185 > 08/14/2013  184 > 01/31/2014  176 >176 .03/22/2014 >  05/31/2014   181 > 06/11/14  179 > 08/20/2014   184> 02/18/2015  181 > 08/01/2015  178 > 09/16/2015 182  > 12/16/2015   185   Vital signs reviewed   Ambulatory healthy appearing wm in no acute distress   HEENT: nl dentition, , and oropharynx. Nl external ear canals without cough reflex - clear nasal drainage  - moderate bilateral non-specific turbinate edema   Neck without JVD/Nodes/TM  Lungs clear to A and P bilaterally without cough on insp or exp maneuvers   RRR no s3 or murmur or increase in P2 / no edema/ nl pulses carotid and feet no bruits Abd soft and benign with nl excursion in the supine position. No bruits or organomegaly  Ext warm without calf tenderness, cyanosis clubbing or edema  Skin warm and dry without lesions  - no rash or ecchymosis  MS nl gait                 Assessment & Plan:

## 2015-12-17 NOTE — Assessment & Plan Note (Signed)
-  hfa 90% p coaching 11/14/2012  - PFT's  09/16/2015  FEV1 1.81 (61 % ) ratio 56  p 44 % improvement from saba with DLCO  81/81c % corrects to 107  % for alv volume  Done prior to any am meds   By pfts he has moderate persistent asthma but All goals of chronic asthma control met including optimal function (though not nl) and elimination of symptoms with minimal need for rescue therapy on present complex rx   Contingencies discussed in full including contacting this office immediately if not controlling the symptoms using the rule of two's.     I had an extended discussion with the patient reviewing all relevant studies completed to date and  lasting 15 to 20 minutes of a 25 minute visit    Each maintenance medication was reviewed in detail including most importantly the difference between maintenance and prns and under what circumstances the prns are to be triggered using an action plan format that is not reflected in the computer generated alphabetically organized AVS but trather by a customized med calendar that reflects the AVS meds with confirmed 100% correlation.   Please see instructions for details which were reviewed in writing and the patient given a copy highlighting the part that I personally wrote and discussed at today's ov.

## 2015-12-17 NOTE — Assessment & Plan Note (Signed)
Adequate control on present rx, reviewed > no change in rx needed   

## 2015-12-17 NOTE — Assessment & Plan Note (Signed)
Lab Results  Component Value Date   CHOL 209* 08/01/2015   HDL 45.30 08/01/2015   LDLCALC 144* 08/01/2015   LDLDIRECT 122.0 08/20/2014   TRIG 101.0 08/01/2015   CHOLHDL 5 08/01/2015     Not quite to goal by ldl but already on zocor 40 mg daily which he reports taking consistently as per med calendar so no change rx needed

## 2015-12-17 NOTE — Assessment & Plan Note (Signed)
-   s/p repeat sinus surgery 03/28/11  rx reviewed/ ent f/u prn

## 2015-12-20 DIAGNOSIS — Z0100 Encounter for examination of eyes and vision without abnormal findings: Secondary | ICD-10-CM | POA: Diagnosis not present

## 2015-12-20 DIAGNOSIS — H2513 Age-related nuclear cataract, bilateral: Secondary | ICD-10-CM | POA: Diagnosis not present

## 2016-01-12 ENCOUNTER — Other Ambulatory Visit: Payer: Self-pay | Admitting: Internal Medicine

## 2016-01-14 ENCOUNTER — Other Ambulatory Visit: Payer: Self-pay | Admitting: Internal Medicine

## 2016-03-17 ENCOUNTER — Other Ambulatory Visit (INDEPENDENT_AMBULATORY_CARE_PROVIDER_SITE_OTHER): Payer: Medicare Other

## 2016-03-17 ENCOUNTER — Encounter: Payer: Self-pay | Admitting: Internal Medicine

## 2016-03-17 ENCOUNTER — Ambulatory Visit (INDEPENDENT_AMBULATORY_CARE_PROVIDER_SITE_OTHER): Payer: Medicare Other | Admitting: Internal Medicine

## 2016-03-17 VITALS — BP 128/78 | HR 78 | Ht 68.0 in | Wt 185.0 lb

## 2016-03-17 DIAGNOSIS — J45998 Other asthma: Secondary | ICD-10-CM | POA: Diagnosis not present

## 2016-03-17 DIAGNOSIS — E785 Hyperlipidemia, unspecified: Secondary | ICD-10-CM

## 2016-03-17 DIAGNOSIS — J339 Nasal polyp, unspecified: Secondary | ICD-10-CM | POA: Insufficient documentation

## 2016-03-17 DIAGNOSIS — I10 Essential (primary) hypertension: Secondary | ICD-10-CM | POA: Diagnosis not present

## 2016-03-17 DIAGNOSIS — Z886 Allergy status to analgesic agent status: Principal | ICD-10-CM

## 2016-03-17 DIAGNOSIS — J45909 Unspecified asthma, uncomplicated: Principal | ICD-10-CM

## 2016-03-17 LAB — HEPATIC FUNCTION PANEL
ALT: 18 U/L (ref 0–53)
AST: 18 U/L (ref 0–37)
Albumin: 4 g/dL (ref 3.5–5.2)
Alkaline Phosphatase: 73 U/L (ref 39–117)
BILIRUBIN DIRECT: 0.1 mg/dL (ref 0.0–0.3)
BILIRUBIN TOTAL: 0.6 mg/dL (ref 0.2–1.2)
TOTAL PROTEIN: 6.8 g/dL (ref 6.0–8.3)

## 2016-03-17 LAB — BASIC METABOLIC PANEL
BUN: 21 mg/dL (ref 6–23)
CHLORIDE: 102 meq/L (ref 96–112)
CO2: 31 mEq/L (ref 19–32)
Calcium: 9 mg/dL (ref 8.4–10.5)
Creatinine, Ser: 1.02 mg/dL (ref 0.40–1.50)
GFR: 76.18 mL/min (ref >60.00)
Glucose, Bld: 96 mg/dL (ref 70–99)
POTASSIUM: 4.5 meq/L (ref 3.5–5.1)
SODIUM: 138 meq/L (ref 135–145)

## 2016-03-17 LAB — LIPID PANEL
CHOL/HDL RATIO: 4
Cholesterol: 194 mg/dL (ref 0–200)
HDL: 46.1 mg/dL (ref >39.00)
LDL CALC: 117 mg/dL — AB (ref 0–99)
NONHDL: 147.6
Triglycerides: 153 mg/dL — ABNORMAL HIGH (ref 0.0–149.0)
VLDL: 30.6 mg/dL (ref 0.0–40.0)

## 2016-03-17 NOTE — Assessment & Plan Note (Addendum)
target < 130 male, pos fm hx, h/o smoking   - 08/11/2011 changed to zocor 40 mg per day    Lab Results  Component Value Date   CHOL 194 03/17/2016   HDL 46.10 03/17/2016   LDLCALC 117 (H) 03/17/2016   LDLDIRECT 122.0 08/20/2014   TRIG 153.0 (H) 03/17/2016   CHOLHDL 4 03/17/2016

## 2016-03-17 NOTE — Assessment & Plan Note (Addendum)
-  hfa 90% p coaching 11/14/2012  - PFT's  09/16/2015  FEV1 1.81 (61 % ) ratio 56  p 44 % improvement from saba with DLCO  81/81c % corrects to 107  % for alv volume  Done prior to any am meds   All goals of chronic asthma control met including optimal (though not nl) function and elimination of symptoms with minimal need for rescue therapy.  Contingencies discussed in full including contacting this office immediately if not controlling the symptoms using the rule of two's.     I had an extended discussion with the patient reviewing all relevant studies completed to date and  lasting 15 to 20 minutes of a 25 minute visit    Each maintenance medication was reviewed in detail including most importantly the difference between maintenance and prns and under what circumstances the prns are to be triggered using an action plan format that is not reflected in the computer generated alphabetically organized AVS but trather by a customized med calendar that reflects the AVS meds with confirmed 100% correlation.   Please see instructions for details which were reviewed in writing and the patient given a copy highlighting the part that I personally wrote and discussed at today's ov.

## 2016-03-17 NOTE — Progress Notes (Signed)
Left detailed msg on his home number

## 2016-03-17 NOTE — Patient Instructions (Signed)
Please remember to go to the lab  department downstairs for your tests - we will call you with the results when they are available.  See calendar for specific medication instructions and bring it back for each and every office visit for every healthcare provider you see.  Without it,  you may not receive the best quality medical care that we feel you deserve.  You will note that the calendar groups together  your maintenance  medications that are timed at particular times of the day.  Think of this as your checklist for what your doctor has instructed you to do until your next evaluation to see what benefit  there is  to staying on a consistent group of medications intended to keep you well.  The other group at the bottom is entirely up to you to use as you see fit  for specific symptoms that may arise between visits that require you to treat them on an as needed basis.  Think of this as your action plan or "what if" list.   Separating the top medications from the bottom group is fundamental to providing you adequate care going forward.   See [name] in 74m  with all your medications, even over the counter meds, separated in two separate bags, the ones you take no matter what vs the ones you stop once you feel better and take only as needed when you feel you need them.   Tammy  will generate for you a new user friendly medication calendar that will put [place] all on the same page re: your medication use.

## 2016-03-17 NOTE — Progress Notes (Signed)
Subjective:    Patient ID: Antonio Carlson, male    DOB: 1972/09/04   MRN: [id number]  Brief patient profile:  70 yowm quit smoking in 1971 with history of Triad Asthma and hyperlipidemia and hbp   HPI  06/12/2025 acute  ov/[name] re: new R arm pain Chief Complaint  Patient presents with  . Follow-up    pain right side back up the shoulder down the arm.  acute onset x 7 days prior to OV  Neck to R shoulder to R hand classic C8 nerve root pattern, worse with ext/ flex of neck. No rash/fever Assoc with slt loss of hand grip strength on R > Pool eval rec steroid rx plus gabapentin > resolved    08/20/2024 f/u ov/[name] re: yearly eval fo rasthma, hyperlipidemia, hbp Chief Complaint  Patient presents with  . Annual Exam    Pt is fasting. He denies any new co's today.   still some numbness in R C8 distribution.  No ex cp, tia, claudication, wheeze or sob  rec Call Dr [name] for any further problems with your hand strength or numbness > improved    02/17/2025 f/u ov/[name] re: triad asthma on laba/ics neb bid  Chief Complaint  Patient presents with  . Follow-up    Pt c/o cough for the past 1-2 months.  Cough is worse in the am and evening and is prod with clear to yellow sputum.    R arm better/ new problem: says doesn't cough much p am laba/ics neb / worse around 3-4 pm  But delays the pm maint dose until 9 pm, taking otc for nose but does not know name >>clort tabs and PPI/pepcid   9/72/2016. Follow up : Triad Asthma    Pt returns for 2 month follow up .  Says his asthma is somewhat improved on current regimen  Pepcid and Chlor tab added to regimen last ov for trigger control for asthma flare  Says it helps but still has aggravating symptoms with on/off wheezing and tightness rec Prednisone taper as directed.  Get your flu shot in 1-2 weeks .  Follow med calendar closely and bring to each visit.  Continue on current regimen .     1/72/2017  f/u ov/[name] re: triad asthma/ chronic  rhinitis  Chief Complaint  Patient presents with  . Annual Exam    Pt states overall doing well. He does have some rhinitis and sinus congestion "I keep it all the time"- worse x 1 month. He has had some cough- prod with clear sputum, relates to PND.   does fine on flat surface, worse breathing out doors and up hills x  Fall  2016 / never uses ventolin  rec Tetanus shot today  Please remember to go to the lab and x-ray department downstairs for your tests - we will call you with the results when they are available. Try using the albuterol 2 pffs before an activity you know will make you short of breath. Prednisone 10 mg take  4 each am x 2 days,   2 each am x 2 days,  1 each am x 2 days and stop  Change the flonase to where you twice daily and remember the afrin. Only use your albuterol as a rescue medication    2/72/2017  f/u ov/[name] re: triad asthma/ maint rx bud/perf and rare saba  Chief Complaint  Patient presents with  . Follow-up    PFT done today. Breathing is doing well. He has  only used albuterol inhaler x 1 since his last visit.   only once over did it carrying a puppy up an incline  rec If need for ventolin goes way up or nasal obstruction does not respond to afrin rec Prednisone 10 mg take  4 each am x 2 days,   2 each am x 2 days,  1 each am x 2 days and stop     5/72/2017  f/u ov/[name] re: hbp/hyperlidpidemia/ triad asthma/ on prn pred last used one month prior to OV  / following med calendar nicely including action plans Chief Complaint  Patient presents with  . Follow-up    Breathing is doing well. He has not had to use albuterol since the last visit. Last pred taper was approx 1 month ago. No new co's today.   Continues mostly with nasal congestion > cough or sob/wheeze on flonase bid and prn h1 ok control/ no need for saba  - Not limited by breathing from desired activities  rec  no change rx   8/72/2017  f/u ov/[name] re: HBP/ hyperlipidemia/ triad asthma on  prednisone prn for nasal symptoms  Chief Complaint  Patient presents with  . Follow-up    Doing well and no new co's today.   very rarely ventolin, main issue is recurrent nasal congestion and has not returned to ent  Taking zocor 40 s muscle cramps/ no tia/claudication   No obvious patterns in day to day or daytime variability or assoc excess/ purulent sputum or mucus plugs  or cp or chest tightness, subjective wheeze or overt   hb symptoms. No unusual exp hx or h/o childhood pna/ asthma or knowledge of premature birth.  Sleeping ok without nocturnal  or early am exacerbation  of respiratory  c/o's or need for noct saba. Also denies any obvious fluctuation of symptoms with weather or environmental changes or other aggravating or alleviating factors except as outlined above   Current Medications, Allergies, Complete Past Medical History, Past Surgical History, Family History, and Social History were reviewed in [employer] record.  ROS  The following are not active complaints unless bolded sore throat, dysphagia, dental problems, itching, sneezing,  nasal congestion or excess/ purulent secretions, ear ache,   fever, chills, sweats, unintended wt loss, classically pleuritic or exertional cp, hemoptysis,  orthopnea pnd or leg swelling, presyncope, palpitations, abdominal pain, anorexia, nausea, vomiting, diarrhea  or change in bowel or bladder habits, change in stools or urine, dysuria,hematuria,  rash, arthralgias, visual complaints, headache, numbness, weakness or ataxia or problems with walking or coordination,  change in mood/affect or memory.         Past Medical History:  PVCs.  Syncope  - Holter ordered June 27, 2008 > nl  - EP consult June 27, 2008  OSTEOPENIA (ICD-733.90)  COLONIC POLYPS (ICD-211.3) .......................................[name] Kitchen [name] - see colonoscopy 11/07/10  DIVERTICULOSIS, MILD (ICD-562.10) ASTHMA (ICD-493.90)  - HFA 90% June 11, 2009  ALLERGIC RHINITIS, CHRONIC (ICD-477.9).......................  [name]  - steroid dep until mid oct 2009  - Allergy profile sent January 23, 2010 >> IgE 18.3  - Polypectomy Sept 2012 ...................................................... [name]  BENIGN PROSTATIC HYPERTROPHY, HX OF (ICD-V13.8) .[name] Kitchen... 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 [place].......................................................[name] KitchenWert  R C8 radiculopathy 2015 ....................................................... [name]  - CPX 08/01/2015  - Pneumovax 11/2004 and @ age 40 June 11, 2009 and Prevnar 08/14/2013  - Td 07/2005  And  08/01/2015  -Med calendar 12/12/2012 , 03/22/2014 , 04/02/2015   Past Surgical History:  Appendectomy  Colonoscopy   Family History:  heart disease in his father onset at age 63 he was a smoker  Ca brain half brother siblings healthy but their half Mother dementia onset late 71s lived to be 76    Social History:  quit smoking 1971  rarely drink alcohol  Retired          Objective:   Physical Exam  wt 198 May 17, 2008 >  > 192 07/17/11 >  02/05/2012  191> 05/10/2012  188> 08/10/2012 190> 11/14/2012 180>182 12/12/2012 > 03/14/2013 182 > 07/06/2013 185 > 08/14/2013  184 > 01/31/2014  176 >176 .03/22/2014 >  05/31/2014   181 > 06/11/14  179 > 08/20/2014   184> 02/18/2015  181 > 08/01/2015  178 > 09/16/2015 182  > 12/16/2015   185 > 03/17/2016  185   Vital signs reviewed   Ambulatory healthy appearing wm in no acute distress   HEENT: nl dentition, , and oropharynx. Nl external ear canals without cough reflex - clear nasal drainage  - moderate bilateral non-specific turbinate edema s polyps viz  Neck without JVD/Nodes/TM  Lungs clear to A and P bilaterally without cough on insp or exp maneuvers   RRR no s3 or  murmur or increase in P2 / no edema/ nl pulses carotid and feet no bruits Abd soft and benign with nl excursion in the supine position. No bruits or organomegaly  Ext warm without calf tenderness, cyanosis clubbing or edema  Skin warm and dry without lesions  - no rash or ecchymosis  MS nl gait          Labs ordered/ reviewed:      Chemistry      Component Value Date/Time   NA 138 03/17/2016 0920   K 4.5 03/17/2016 0920   CL 102 03/17/2016 0920   CO2 31 03/17/2016 0920   BUN 21 03/17/2016 0920   CREATININE 1.02 03/17/2016 0920      Component Value Date/Time   CALCIUM 9.0 03/17/2016 0920   ALKPHOS 73 03/17/2016 0920   AST 18 03/17/2016 0920   ALT 18 03/17/2016 0920   BILITOT 0.6 03/17/2016 0920                    Assessment & Plan:

## 2016-03-17 NOTE — Assessment & Plan Note (Signed)
09/16/15 placed on prn pred for nasal obst symptoms  Referred back to [facility]  03/17/2016  As using too much pred to control it

## 2016-03-18 NOTE — Assessment & Plan Note (Signed)
Adequate control on present rx, reviewed > no change in rx needed   

## 2016-04-03 ENCOUNTER — Other Ambulatory Visit: Payer: Self-pay | Admitting: Internal Medicine

## 2016-04-08 DIAGNOSIS — Z23 Encounter for immunization: Secondary | ICD-10-CM | POA: Diagnosis not present

## 2016-04-14 ENCOUNTER — Other Ambulatory Visit: Payer: Self-pay | Admitting: Internal Medicine

## 2016-06-23 ENCOUNTER — Encounter: Payer: Self-pay | Admitting: Adult Health

## 2016-06-23 ENCOUNTER — Ambulatory Visit (INDEPENDENT_AMBULATORY_CARE_PROVIDER_SITE_OTHER): Payer: Medicare Other | Admitting: Adult Health

## 2016-06-23 DIAGNOSIS — J339 Nasal polyp, unspecified: Secondary | ICD-10-CM

## 2016-06-23 DIAGNOSIS — J3089 Other allergic rhinitis: Secondary | ICD-10-CM | POA: Diagnosis not present

## 2016-06-23 DIAGNOSIS — I10 Essential (primary) hypertension: Secondary | ICD-10-CM | POA: Diagnosis not present

## 2016-06-23 MED ORDER — PREDNISONE 10 MG PO TABS: ORAL_TABLET | ORAL | 0 refills | Status: DC

## 2016-06-23 MED ORDER — FLUTICASONE PROPIONATE 50 MCG/ACT NA SUSP: NASAL | 1 refills | Status: DC

## 2016-06-23 NOTE — Addendum Note (Signed)
Addended by: Doroteo Glassman D on: 06/23/2016 09:57 AM   Modules accepted: Orders

## 2016-06-23 NOTE — Patient Instructions (Signed)
Prednisone taper as directed.  Follow med calendar closely and bring to each visit.  Restart Flonase .  Follow up Dr. Melvyn Novas  In 4 months and As needed

## 2016-06-23 NOTE — Assessment & Plan Note (Signed)
Flare with nasal polyps   Plan  Prednisone taper  Restart flonase .  Saline nasal rinses

## 2016-06-23 NOTE — Assessment & Plan Note (Signed)
Steroid taper  Restart flonase

## 2016-06-23 NOTE — Progress Notes (Signed)
Subjective:    Patient ID: Antonio Carlson, male    DOB: 1945/09/05   MRN: [id number]  Brief patient profile:  4 yowm quit smoking in 1971 with history of Triad Asthma and hyperlipidemia and hbp   HPI  06/12/2014 acute  ov/[name] re: new R arm pain Chief Complaint  Patient presents with  . Follow-up    pain right side back up the shoulder down the arm.  acute onset x 7 days prior to OV  Neck to R shoulder to R hand classic C8 nerve root pattern, worse with ext/ flex of neck. No rash/fever Assoc with slt loss of hand grip strength on R > Pool eval rec steroid rx plus gabapentin > resolved    08/20/2014 f/u ov/[name] re: yearly eval fo rasthma, hyperlipidemia, hbp Chief Complaint  Patient presents with  . Annual Exam    Pt is fasting. He denies any new co's today.   still some numbness in R C8 distribution.  No ex cp, tia, claudication, wheeze or sob  rec Call Dr [name] for any further problems with your hand strength or numbness > improved    02/18/2015 f/u ov/[name] re: triad asthma on laba/ics neb bid  Chief Complaint  Patient presents with  . Follow-up    Pt c/o cough for the past 1-2 months.  Cough is worse in the am and evening and is prod with clear to yellow sputum.    R arm better/ new problem: says doesn't cough much p am laba/ics neb / worse around 3-4 pm  But delays the pm maint dose until 9 pm, taking otc for nose but does not know name >>clort tabs and PPI/pepcid   04/02/2015. Follow up : [facility] Asthma    Pt returns for 2 month follow up .  Says his asthma is somewhat improved on current regimen  Pepcid and Chlor tab added to regimen last ov for trigger control for asthma flare  Says it helps but still has aggravating symptoms with on/off wheezing and tightness rec Prednisone taper as directed.  Get your flu shot in 1-2 weeks .  Follow med calendar closely and bring to each visit.  Continue on current regimen .     08/01/2015  f/u ov/[name] re: triad asthma/ chronic  rhinitis  Chief Complaint  Patient presents with  . Annual Exam    Pt states overall doing well. He does have some rhinitis and sinus congestion "I keep it all the time"- worse x 1 month. He has had some cough- prod with clear sputum, relates to PND.   does fine on flat surface, worse breathing out doors and up hills x  Fall  2016 / never uses ventolin  rec Tetanus shot today  Please remember to go to the lab and x-ray department downstairs for your tests - we will call you with the results when they are available. Try using the albuterol 2 pffs before an activity you know will make you short of breath. Prednisone 10 mg take  4 each am x 2 days,   2 each am x 2 days,  1 each am x 2 days and stop  Change the flonase to where you twice daily and remember the afrin. Only use your albuterol as a rescue medication    09/16/2015  f/u ov/[name] re: triad asthma/ maint rx bud/perf and rare saba  Chief Complaint  Patient presents with  . Follow-up    PFT done today. Breathing is doing well. He has  only used albuterol inhaler x 1 since his last visit.   only once over did it carrying a puppy up an incline  rec If need for ventolin goes way up or nasal obstruction does not respond to afrin rec Prednisone 10 mg take  4 each am x 2 days,   2 each am x 2 days,  1 each am x 2 days and stop     12/16/2015  f/u ov/[name] re: hbp/hyperlidpidemia/ triad asthma/ on prn pred last used one month prior to OV  / following med calendar nicely including action plans Chief Complaint  Patient presents with  . Follow-up    Breathing is doing well. He has not had to use albuterol since the last visit. Last pred taper was approx 1 month ago. No new co's today.   Continues mostly with nasal congestion > cough or sob/wheeze on flonase bid and prn h1 ok control/ no need for saba  - Not limited by breathing from desired activities  rec  no change rx   03/17/2016  f/u ov/[name] re: HBP/ hyperlipidemia/ triad asthma on  prednisone prn for nasal symptoms  Chief Complaint  Patient presents with  . Follow-up    Doing well and no new co's today.   very rarely ventolin, main issue is recurrent nasal congestion and has not returned to ent  Taking zocor 40 s muscle cramps/ no tia/claudication  >>no changes   06/23/2016 Follow up : HTN, Hyperlipidemia/Triad Asthma  Pt returns for 3 month follow up and med review .  We reviewed all his meds and organized them into a med calendar w/ pt education . He appears to be taking correctly except for flonase , has not been using on regular basis.  Complains of 1 week of nasal congestion , drainage, stuffiness, sneezing, and ear fullness. No fever or sinus pain. Minimal cough . No wheezing . No increased ventolin use.  He denies chest pain, orthopnea, edema or fever.   .   Current Medications, Allergies, Complete Past Medical History, Past Surgical History, Family History, and Social History were reviewed in [employer] record.  ROS  The following are not active complaints unless bolded sore throat, dysphagia, dental problems, itching, sneezing,  nasal congestion or excess/ purulent secretions, ear ache,   fever, chills, sweats, unintended wt loss, classically pleuritic or exertional cp, hemoptysis,  orthopnea pnd or leg swelling, presyncope, palpitations, abdominal pain, anorexia, nausea, vomiting, diarrhea  or change in bowel or bladder habits, change in stools or urine, dysuria,hematuria,  rash, arthralgias, visual complaints, headache, numbness, weakness or ataxia or problems with walking or coordination,  change in mood/affect or memory.         Past Medical History:  PVCs.  Syncope  - Holter ordered June 27, 2008 > nl  - EP consult June 27, 2008  OSTEOPENIA (ICD-733.90)  COLONIC POLYPS (ICD-211.3) .......................................[name] Kitchen [name] - see colonoscopy 11/07/10 (repeat in 10 yr)  DIVERTICULOSIS, MILD (ICD-562.10) ASTHMA  (ICD-493.90)  - HFA 90% June 11, 2009  ALLERGIC RHINITIS, CHRONIC (ICD-477.9).......................  [name]  - steroid dep until mid oct 2009  - Allergy profile sent January 23, 2010 >> IgE 18.3  - Polypectomy Sept 2012 ...................................................... [name]  BENIGN PROSTATIC HYPERTROPHY, HX OF (ICD-V13.8) .[name] Kitchen... 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 [place].......................................................[name] KitchenWert  R C8 radiculopathy 2015 ....................................................... [name]  - CPX 08/01/2015  - Pneumovax 11/2004  and @ age 41 June 11, 2009 and Prevnar 08/14/2013  - Td 07/2005   And  08/01/2015  -Med calendar 12/12/2012 , 03/22/2014 , 04/02/2015 , 06/23/2016   Past Surgical History:  Appendectomy  Colonoscopy   Family History:  heart disease in his father onset at age 40 he was a smoker  Ca brain half brother siblings healthy but their half Mother dementia onset late 32s lived to be 82    Social History:  quit smoking 1971  rarely drink alcohol  Retired          Objective:   Physical Exam  wt 198 May 17, 2008 >  > 192 07/17/11 >  02/05/2012  191> 05/10/2012  188> 08/10/2012 190> 11/14/2012 180>182 12/12/2012 > 03/14/2013 182 > 07/06/2013 185 > 08/14/2013  184 > 01/31/2014  176 >176 .03/22/2014 >  05/31/2014   181 > 06/11/14  179 > 08/20/2014   184> 02/18/2015  181 > 08/01/2015  178 > 09/16/2015 182  > 12/16/2015   185 > 03/17/2016  185   Vitals:   06/23/16 0904  BP: 112/64  Pulse: 78  Temp: 97.8 F (36.6 C)  TempSrc: Oral  SpO2: 96%  Weight: 188 lb 6.4 oz (85.5 kg)  Height: 5\' 8"  (1.727 m)     Ambulatory healthy appearing wm in no acute distress   HEENT: nl dentition, , and oropharynx. Nl external ear canals without cough reflex - clear nasal  drainage  - moderate bilateral non-specific turbinate edema w/ visible small distal polyp left  Neck without JVD/Nodes/TM  Lungs clear to A and P bilaterally without cough on insp or exp maneuvers   RRR no s3 or murmur or increase in P2 / no edema/ nl pulses carotid and feet no bruits Abd soft and benign with nl excursion in the supine position. No bruits or organomegaly  Ext warm without calf tenderness, cyanosis clubbing or edema  Skin warm and dry without lesions  - no rash or ecchymosis  MS nl gait          Labs ordered/ reviewed:      Chemistry      Component Value Date/Time   NA 138 03/17/2016 0920   K 4.5 03/17/2016 0920   CL 102 03/17/2016 0920   CO2 31 03/17/2016 0920   BUN 21 03/17/2016 0920   CREATININE 1.02 03/17/2016 0920      Component Value Date/Time   CALCIUM 9.0 03/17/2016 0920   ALKPHOS 73 03/17/2016 0920   AST 18 03/17/2016 0920   ALT 18 03/17/2016 0920   BILITOT 0.6 03/17/2016 0920         Ewen Varnell NP-C  [facility]  06/23/2016

## 2016-06-23 NOTE — Progress Notes (Signed)
Chart and office note reviewed in detail  > agree with a/p as outlined    

## 2016-06-23 NOTE — Assessment & Plan Note (Signed)
Controlled on rx   Plan  Diet and exercise discussed  Patient's medications were reviewed today and patient education was given. Computerized medication calendar was adjusted/completed

## 2016-07-07 NOTE — Addendum Note (Signed)
Addended by: Doroteo Glassman D on: 07/07/2016 09:19 AM   Modules accepted: Orders

## 2016-08-07 ENCOUNTER — Ambulatory Visit (INDEPENDENT_AMBULATORY_CARE_PROVIDER_SITE_OTHER): Payer: Medicare Other | Admitting: Internal Medicine

## 2016-08-07 ENCOUNTER — Encounter: Payer: Self-pay | Admitting: Internal Medicine

## 2016-08-07 VITALS — BP 122/82 | HR 81 | Ht 68.0 in | Wt 187.0 lb

## 2016-08-07 DIAGNOSIS — J45909 Unspecified asthma, uncomplicated: Secondary | ICD-10-CM

## 2016-08-07 DIAGNOSIS — J301 Allergic rhinitis due to pollen: Secondary | ICD-10-CM

## 2016-08-07 DIAGNOSIS — J339 Nasal polyp, unspecified: Secondary | ICD-10-CM | POA: Diagnosis not present

## 2016-08-07 DIAGNOSIS — Z886 Allergy status to analgesic agent status: Secondary | ICD-10-CM

## 2016-08-07 MED ORDER — CEFDINIR 300 MG PO CAPS: 300.0000 mg | ORAL_CAPSULE | Freq: Two times a day (BID) | ORAL | 0 refills | Status: DC

## 2016-08-07 MED ORDER — PREDNISONE 10 MG PO TABS: ORAL_TABLET | ORAL | 5 refills | Status: DC

## 2016-08-07 NOTE — Progress Notes (Signed)
Subjective:    Patient ID: Antonio Carlson, male    DOB: 1946/03/31   MRN: [id number]  Brief patient profile:  71 yowm quit smoking in 1971 with history of Triad Asthma and hyperlipidemia and hbp   Brief patient profile:  06/23/2016 NP  Follow up : HTN, Hyperlipidemia/Triad Asthma  Pt returns for 3 month follow up and med review .  We reviewed all his meds and organized them into a med calendar w/ pt education . He appears to be taking correctly except for flonase , has not been using on regular basis.  Complains of 1 week of nasal congestion , drainage, stuffiness, sneezing, and ear fullness. No fever or sinus pain. Minimal cough .   rec Prednisone taper as directed.  Follow med calendar closely and bring to each visit.  Restart Flonase    08/07/2016  Acute  ov/Antonio Carlson re: nasal congestion Chief Complaint  Patient presents with  . Acute Visit    Pt c/o PND, stuffy ears, and sinus pressure since off and on since Nov 2017. He has had some yellow nasal d/c.    not really all better since November 2017 though improved p prednisone and using afrin/flonase for persistent nasal congstion  Breathing fine and not needing rescue / happy with neb laba/ics  Has action plan for pred for sob or nasal congestion but though he had to call for rx  Rarely needs saba now   No obvious day to day or daytime variability or assoc   mucus plugs or hemoptysis or cp or chest tightness, subjective wheeze or overt sinus or hb symptoms. No unusual exp hx or h/o childhood pna/ asthma or knowledge of premature birth.  Sleeping ok without nocturnal  or early am exacerbation  of respiratory  c/o's or need for noct saba. Also denies any obvious fluctuation of symptoms with weather or environmental changes or other aggravating or alleviating factors except as outlined above   Current Medications, Allergies, Complete Past Medical History, Past Surgical History, Family History, and Social History were reviewed in [employer] record.  ROS  The following are not active complaints unless bolded sore throat, dysphagia, dental problems, itching, sneezing,  nasal congestion or excess/ purulent secretions, ear ache,   fever, chills, sweats, unintended wt loss, classically pleuritic or exertional cp,  orthopnea pnd or leg swelling, presyncope, palpitations, abdominal pain, anorexia, nausea, vomiting, diarrhea  or change in bowel or bladder habits, change in stools or urine, dysuria,hematuria,  rash, arthralgias, visual complaints, headache, numbness, weakness or ataxia or problems with walking or coordination,  change in mood/affect or memory.             .             Past Medical History:  PVCs.  Syncope  - Holter ordered June 27, 2008 > nl  - EP consult June 27, 2008  OSTEOPENIA (ICD-733.90)  COLONIC POLYPS (ICD-211.3) .......................................[name] Kitchen [name] - see colonoscopy 11/07/10 (repeat in 10 yr)  DIVERTICULOSIS, MILD (ICD-562.10) ASTHMA (ICD-493.90)  - HFA 90% June 11, 2009  ALLERGIC RHINITIS, CHRONIC (ICD-477.9).......................  [name]  - steroid dep until mid oct 2009  - Allergy profile sent January 23, 2010 >> IgE 18.3  - Polypectomy Sept 2012 ...................................................... [name]  BENIGN PROSTATIC HYPERTROPHY, HX OF (ICD-V13.8) .[name] Kitchen... 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 [place].......................................................[name] KitchenWert  R C8 radiculopathy 2015 ....................................................... [name]  -  CPX 08/01/2015  - Pneumovax 11/2004 and @ age 52 June 11, 2009 and Prevnar 08/14/2013  - Td 07/2005   And  08/01/2015  -Med calendar 12/12/2012 , 03/22/2014 , 04/02/2015 , 06/23/2016   Past Surgical History:   Appendectomy  Colonoscopy   Family History:  heart disease in his father onset at age 30 he was a smoker  Ca brain half brother siblings healthy but their half Mother dementia onset late 57s lived to be 82    Social History:  quit smoking 1971  rarely drink alcohol  Retired          Objective:   Physical Exam  wt 198 May 17, 2008 >  > 192 07/17/11 >  73/06/2012  191> 73/15/2013  188> 08/10/2012 190> 73/21/2014 180>182 12/12/2012 > 73/19/2014 182 > 07/06/2013 185 > 73/19/2015  184 > 01/31/2014  176 >176 .03/22/2014 >  05/31/2014   181 > 06/11/14  179 > 73/25/2016   184> 73/25/2016  181 > 08/01/2015  178 > 73/20/2017 182  > 12/16/2015   185 > 03/17/2016  185> 08/07/2016   187      Ambulatory healthy appearing wm in no acute distress  /  Vital signs reviewed  - Note on arrival 02 sats  95% on RA   HEENT: nl dentition, , and oropharynx. Nl external ear canals without cough reflex - clear nasal drainage  - moderate bilateral non-specific turbinate edema w/ visible small distal polyp left side  Neck without JVD/Nodes/TM  Lungs very minimal insp/exp  bilaterally without cough on insp or exp maneuvers   RRR no s3 or murmur or increase in P2 / no edema/ nl pulses carotid and feet no bruits Abd soft and benign with nl excursion in the supine position. No bruits or organomegaly  Ext warm without calf tenderness, cyanosis clubbing or edema  Skin warm and dry without lesions  - no rash or ecchymosis  MS nl gait

## 2016-08-07 NOTE — Patient Instructions (Addendum)
omnicef 300 mg twice daily x 10 days  As per the action plan on your med calendar >  Prednisone 10 mg take  4 each am x 2 days,   2 each am x 2 days,  1 each am x 2 days and stop    See calendar for specific medication instructions and bring it back for each and every office visit for every healthcare provider you see.  Without it,  you may not receive the best quality medical care that we feel you deserve.  You will note that the calendar groups together  your maintenance  medications that are timed at particular times of the day.  Think of this as your checklist for what your doctor has instructed you to do until your next evaluation to see what benefit  there is  to staying on a consistent group of medications intended to keep you well.  The other group at the bottom is entirely up to you to use as you see fit  for specific symptoms that may arise between visits that require you to treat them on an as needed basis.  Think of this as your action plan or "what if" list.   Separating the top medications from the bottom group is fundamental to providing you adequate care going forward.   Keep previous appt - if not improving see Dr Meredith Leeds

## 2016-08-08 NOTE — Assessment & Plan Note (Signed)
May now be developing recurent or persistent sinus infections at this point  rec pred x 6 days/ omnicef x 10 and f/u ent prn     Each maintenance medication was reviewed in detail including most importantly the difference between maintenance and as needed and under what circumstances the prns are to be used. This was done in the context of a medication calendar review which provided the patient with a user-friendly unambiguous mechanism for medication administration and reconciliation and provides an action plan for all active problems. It is critical that this be shown to every doctor  for modification during the office visit if necessary so the patient can use it as a working document.

## 2016-08-08 NOTE — Assessment & Plan Note (Signed)
-  hfa 90% p coaching 11/14/2012  - PFT's  09/16/2015  FEV1 1.81 (61 % ) ratio 56  p 44 % improvement from saba with DLCO  81/81c % corrects to 107  % for alv volume  Done prior to any am meds   All goals of chronic asthma control met including optimal function and elimination of symptoms with minimal need for rescue therapy.  Contingencies discussed in full including contacting this office immediately if not controlling the symptoms using the rule of two's.

## 2016-10-22 ENCOUNTER — Other Ambulatory Visit (INDEPENDENT_AMBULATORY_CARE_PROVIDER_SITE_OTHER): Payer: Medicare Other

## 2016-10-22 ENCOUNTER — Encounter: Payer: Self-pay | Admitting: Internal Medicine

## 2016-10-22 ENCOUNTER — Ambulatory Visit (INDEPENDENT_AMBULATORY_CARE_PROVIDER_SITE_OTHER): Payer: Medicare Other | Admitting: Internal Medicine

## 2016-10-22 ENCOUNTER — Ambulatory Visit (INDEPENDENT_AMBULATORY_CARE_PROVIDER_SITE_OTHER)
Admission: RE | Admit: 2016-10-22 | Discharge: 2016-10-22 | Disposition: A | Discharge status: Home / Self Care | Payer: Medicare Other | Source: Ambulatory Visit | Attending: Internal Medicine | Admitting: Internal Medicine

## 2016-10-22 VITALS — BP 146/80 | HR 66 | Ht 68.0 in | Wt 183.0 lb

## 2016-10-22 DIAGNOSIS — J45909 Unspecified asthma, uncomplicated: Secondary | ICD-10-CM

## 2016-10-22 DIAGNOSIS — J339 Nasal polyp, unspecified: Secondary | ICD-10-CM

## 2016-10-22 DIAGNOSIS — Z886 Allergy status to analgesic agent status: Secondary | ICD-10-CM

## 2016-10-22 DIAGNOSIS — I10 Essential (primary) hypertension: Secondary | ICD-10-CM | POA: Diagnosis not present

## 2016-10-22 DIAGNOSIS — E785 Hyperlipidemia, unspecified: Secondary | ICD-10-CM | POA: Diagnosis not present

## 2016-10-22 LAB — CBC WITH DIFFERENTIAL/PLATELET
BASOS ABS: 0 10*3/uL (ref 0.0–0.1)
Basophils Relative: 0.5 % (ref 0.0–3.0)
EOS ABS: 0.7 10*3/uL (ref 0.0–0.7)
Eosinophils Relative: 8.1 % — ABNORMAL HIGH (ref 0.0–5.0)
HCT: 48.1 % (ref 39.0–52.0)
Hemoglobin: 16.2 g/dL (ref 13.0–17.0)
LYMPHS ABS: 2.4 10*3/uL (ref 0.7–4.0)
Lymphocytes Relative: 29.3 % (ref 12.0–46.0)
MCHC: 33.7 g/dL (ref 30.0–36.0)
MCV: 85.2 fl (ref 78.0–100.0)
Monocytes Absolute: 0.8 10*3/uL (ref 0.1–1.0)
Monocytes Relative: 9.8 % (ref 3.0–12.0)
NEUTROS ABS: 4.3 10*3/uL (ref 1.4–7.7)
Neutrophils Relative %: 52.3 % (ref 43.0–77.0)
PLATELETS: 287 10*3/uL (ref 150.0–400.0)
RBC: 5.65 Mil/uL (ref 4.22–5.81)
RDW: 14.5 % (ref 11.5–15.5)
WBC: 8.3 10*3/uL (ref 4.0–10.5)

## 2016-10-22 LAB — HEPATIC FUNCTION PANEL
ALK PHOS: 94 U/L (ref 39–117)
ALT: 20 U/L (ref 0–53)
AST: 23 U/L (ref 0–37)
Albumin: 4.2 g/dL (ref 3.5–5.2)
BILIRUBIN DIRECT: 0.2 mg/dL (ref 0.0–0.3)
Total Bilirubin: 0.8 mg/dL (ref 0.2–1.2)
Total Protein: 7 g/dL (ref 6.0–8.3)

## 2016-10-22 LAB — LIPID PANEL
CHOL/HDL RATIO: 4
Cholesterol: 188 mg/dL (ref 0–200)
HDL: 45.9 mg/dL (ref >39.00)
LDL CALC: 113 mg/dL — AB (ref 0–99)
NONHDL: 142.02
TRIGLYCERIDES: 146 mg/dL (ref 0.0–149.0)
VLDL: 29.2 mg/dL (ref 0.0–40.0)

## 2016-10-22 LAB — BASIC METABOLIC PANEL
BUN: 18 mg/dL (ref 6–23)
CALCIUM: 9.6 mg/dL (ref 8.4–10.5)
CO2: 32 mEq/L (ref 19–32)
Chloride: 102 mEq/L (ref 96–112)
Creatinine, Ser: 0.99 mg/dL (ref 0.40–1.50)
GFR: 78.71 mL/min (ref >60.00)
GLUCOSE: 108 mg/dL — AB (ref 70–99)
POTASSIUM: 4.4 meq/L (ref 3.5–5.1)
Sodium: 140 mEq/L (ref 135–145)

## 2016-10-22 LAB — TSH: TSH: 1.65 u[IU]/mL (ref 0.35–4.50)

## 2016-10-22 MED ORDER — PREDNISONE 10 MG PO TABS: ORAL_TABLET | ORAL | 11 refills | Status: DC

## 2016-10-22 NOTE — Patient Instructions (Addendum)
Please prednisone and keep up with refills   Call Dr Ralene Muskrat office to see when your yearly follow up is due   Please remember to go to the lab and x-ray department downstairs in the basement  for your tests - we will call you with the results when they are available.  See calendar for specific medication instructions and bring it back for each and every office visit for every healthcare provider you see.  Without it,  you may not receive the best quality medical care that we feel you deserve.  You will note that the calendar groups together  your maintenance  medications that are timed at particular times of the day.  Think of this as your checklist for what your doctor has instructed you to do until your next evaluation to see what benefit  there is  to staying on a consistent group of medications intended to keep you well.  The other group at the bottom is entirely up to you to use as you see fit  for specific symptoms that may arise between visits that require you to treat them on an as needed basis.  Think of this as your action plan or "what if" list.   Separating the top medications from the bottom group is fundamental to providing you adequate care going forward.    Please schedule a follow up visit in 3 months but call sooner if needed

## 2016-10-22 NOTE — Progress Notes (Signed)
Subjective:    Patient ID: Antonio Carlson, male    DOB: 1945/12/12   MRN: [id number]  Brief patient profile:  1   yowm quit smoking in 1971 with history of Triad Asthma and hyperlipidemia and hbp   Brief patient profile:  06/23/2016 NP  Follow up : HTN, Hyperlipidemia/Triad Asthma  Pt returns for 3 month follow up and med review .  We reviewed all his meds and organized them into a med calendar w/ pt education . He appears to be taking correctly except for flonase , has not been using on regular basis.  Complains of 1 week of nasal congestion , drainage, stuffiness, sneezing, and ear fullness. No fever or sinus pain. Minimal cough .   rec Prednisone taper as directed.  Follow med calendar closely and bring to each visit.  Restart Flonase    08/07/2016  Acute  ov/Simone Tuckey re: nasal congestion Chief Complaint  Patient presents with  . Acute Visit    Pt c/o PND, stuffy ears, and sinus pressure since off and on since Nov 2017. He has had some yellow nasal d/c.    not really all better since November 2017 though improved p prednisone and using afrin/flonase for persistent nasal congstion  Breathing fine and not needing rescue / happy with neb laba/ics  Has action plan for pred for sob or nasal congestion but though he had to call for rx  Rarely needs saba now  rec omnicef 300 mg twice daily x 10 days As per the action plan on your med calendar >  Prednisone 10 mg take  4 each am x 2 days,   2 each am x 2 days,  1 each am x 2 days and stop     10/22/2016  f/u ov/Tynetta Bachmann re:  hbp/ hyperlipidemia/triad asthma on  Prn prednisone  Chief Complaint  Patient presents with  . Follow-up    Breathing is doing well today. He has had had to use his albuterol inhaler recently.    following med calendar well Not limited by breathing from desired activities    No obvious day to day or daytime variability or assoc excess/ purulent sputum or mucus plugs or hemoptysis or cp or chest tightness, subjective  wheeze or overt sinus or hb symptoms. No unusual exp hx or h/o childhood pna/ asthma or knowledge of premature birth.  Sleeping ok without nocturnal  or early am exacerbation  of respiratory  c/o's or need for noct saba. Also denies any obvious fluctuation of symptoms with weather or environmental changes or other aggravating or alleviating factors except as outlined above   Current Medications, Allergies, Complete Past Medical History, Past Surgical History, Family History, and Social History were reviewed in [employer] record.  ROS  The following are not active complaints unless bolded sore throat, dysphagia, dental problems, itching, sneezing,  nasal congestion or excess/ purulent secretions, ear ache,   fever, chills, sweats, unintended wt loss, classically pleuritic or exertional cp,  orthopnea pnd or leg swelling, presyncope, palpitations, abdominal pain, anorexia, nausea, vomiting, diarrhea  or change in bowel or bladder habits, change in stools or urine, dysuria,hematuria,  rash, arthralgias, visual complaints, headache, numbness, weakness or ataxia or problems with walking or coordination,  change in mood/affect or memory.            Past Medical History:  PVCs.  Syncope  - Holter ordered June 27, 2008 > nl  - EP consult June 27, 2008  OSTEOPENIA (ICD-733.90)  COLONIC POLYPS (ICD-211.3) .......................................[name] Kitchen [name] - see colonoscopy 11/07/10 (repeat in 10 yr)  DIVERTICULOSIS, MILD (ICD-562.10) ASTHMA (ICD-493.90)  - HFA 90% June 11, 2009  ALLERGIC RHINITIS, CHRONIC (ICD-477.9).......................  [name]  - steroid dep until mid oct 2009  - Allergy profile sent January 23, 2010 >> IgE 18.3  - Polypectomy Sept 2012 ...................................................... [name]  BENIGN PROSTATIC HYPERTROPHY, HX OF (ICD-V13.8) .[name] Kitchen... 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 [place].......................................................[name] KitchenWert  R C8 radiculopathy 2015 ....................................................... [name]  - CPX 10/22/2016  - Pneumovax 11/2004 and @ age 40 June 11, 2009 and Prevnar 08/14/2013  - Td 07/2005   And  08/01/2015        Past Surgical History:  Appendectomy  Colonoscopy   Family History:  heart disease in his father onset at age 38 he was a smoker  Ca brain half brother siblings healthy but their half Mother dementia onset late 45s lived to be 73    Social History:  quit smoking 1971  rarely drink alcohol  Retired          Objective:   Physical Exam  wt 198 May 17, 2008 >  > 192 07/17/11 >  02/05/2012  191> 05/10/2012  188> 08/10/2012 190> 11/14/2012 180>182 12/12/2012 > 03/14/2013 182 > 07/06/2013 185 > 08/14/2013  184 > 01/31/2014  176 >176 .03/22/2014 >  05/31/2014   181 > 06/11/14  179 > 08/20/2014   184> 02/18/2015  181 > 08/01/2015  178 > 09/16/2015 182  > 12/16/2015   185 > 03/17/2016  185> 08/07/2016   187  > 10/22/2016    183    Ambulatory healthy appearing wm in no acute distress  /  Vital signs reviewed  - Note on arrival 02 sats  95% on RA and bp 146/80   HEENT: nl dentition, , and oropharynx. Nl external ear canals without cough reflex - moderate bilateral non-specific turbinate edema    Neck without JVD/Nodes/TM  Lungs clear to A and P without cough on insp or exp maneuvers   RRR no s3 or murmur or increase in P2 / no edema/ nl pulses carotid and feet no bruits Abd soft and benign with nl excursion in the supine position. No bruits or organomegaly  Ext warm without calf tenderness, cyanosis clubbing or edema  Skin warm and dry without lesions  - no rash or ecchymosis  MS nl gait Neuro:  Alert/ nl sensorium/ no motor or cerebellar deficits  GU  Per Dr Jeffie Pollock         CXR PA and  Lateral:   10/22/2016 :    I personally reviewed images and agree with radiology impression as follows:    No active cardiopulmonary disease.  Nodular density projecting over the left lower lung is felt a represent nipple shadow. This could be confirmed with repeat frontal view with nipple markers.   Labs ordered/ reviewed:      Chemistry      Component Value Date/Time   NA 140 10/22/2016 1011   K 4.4 10/22/2016 1011   CL 102 10/22/2016 1011   CO2 32 10/22/2016 1011   BUN 18 10/22/2016 1011   CREATININE 0.99 10/22/2016 1011      Component Value Date/Time   CALCIUM 9.6 10/22/2016 1011   ALKPHOS 94 10/22/2016 1011   AST 23 10/22/2016 1011   ALT 20 10/22/2016 1011   BILITOT 0.8 10/22/2016  1011        Lab Results  Component Value Date   WBC 8.3 10/22/2016   HGB 16.2 10/22/2016   HCT 48.1 10/22/2016   MCV 85.2 10/22/2016   PLT 287.0 10/22/2016       Lab Results  Component Value Date   TSH 1.65 10/22/2016

## 2016-10-22 NOTE — Progress Notes (Signed)
Spoke with pt and notified of results per Dr. [name]. Pt verbalized understanding and denied any questions. 

## 2016-10-26 NOTE — Assessment & Plan Note (Signed)
-  hfa 90% p coaching 11/14/2012  - PFT's  09/16/2015  FEV1 1.81 (61 % ) ratio 56  p 44 % improvement from saba with DLCO  81/81c % corrects to 107  % for alv volume  Done prior to any am meds   All goals of chronic asthma control met including optimal function and elimination of symptoms with minimal need for rescue therapy.  Contingencies discussed in full including contacting this office immediately if not controlling the symptoms using the rule of two's.

## 2016-10-26 NOTE — Assessment & Plan Note (Signed)
target < 130 male, pos fm hx, h/o smoking   - 08/11/2011 changed to zocor 40 mg per day    Lab Results  Component Value Date   CHOL 188 10/22/2016   HDL 45.90 10/22/2016   LDLCALC 113 (H) 10/22/2016   LDLDIRECT 122.0 08/20/2014   TRIG 146.0 10/22/2016   CHOLHDL 4 10/22/2016    LFT's ok on 40 mg zocor  Adequate control on present rx, reviewed in detail with pt > no change in rx needed

## 2016-10-26 NOTE — Assessment & Plan Note (Signed)
Lab Results  Component Value Date   CREATININE 0.99 10/22/2016   CREATININE 1.02 03/17/2016   CREATININE 1.03 08/01/2015      Adequate control on present rx, reviewed in detail with pt > no change in rx needed

## 2016-10-27 ENCOUNTER — Telehealth: Payer: Self-pay | Admitting: Internal Medicine

## 2016-10-27 ENCOUNTER — Other Ambulatory Visit: Payer: Self-pay | Admitting: Internal Medicine

## 2016-10-27 MED ORDER — BUDESONIDE 0.25 MG/2ML IN SUSP: RESPIRATORY_TRACT | 5 refills | Status: DC

## 2016-10-27 NOTE — Telephone Encounter (Signed)
Patient returning call - he can be reached at [phone] -pr

## 2016-10-27 NOTE — Telephone Encounter (Signed)
Pt aware that we are refilling his Perforomist to [facility]. Nothing further needed.

## 2016-10-27 NOTE — Telephone Encounter (Signed)
lmtcb X1 for pt  

## 2016-11-03 ENCOUNTER — Telehealth: Payer: Self-pay | Admitting: Internal Medicine

## 2016-11-03 MED ORDER — FORMOTEROL FUMARATE 20 MCG/2ML IN NEBU: INHALATION_SOLUTION | RESPIRATORY_TRACT | 5 refills | Status: DC

## 2016-11-03 NOTE — Telephone Encounter (Signed)
Called and spoke to pt's wife, she states they are needing a diagnosis code for a neb med. [place] and was advised they need the perforomist neb med e-scribed again with diagnosis code, this has been done. Nothing further needed at this time.

## 2016-11-03 NOTE — Telephone Encounter (Signed)
Called and spoke to pharmacist at [facility] and was advised insurance would like a more specific diagnosis. New rx with new dx code sent to pharmacy. Called and informed pt's wife. She verbalized understanding and denied any further questions or concerns at this time.

## 2016-12-17 ENCOUNTER — Ambulatory Visit: Payer: Medicare Other | Admitting: Internal Medicine

## 2017-01-07 DIAGNOSIS — H524 Presbyopia: Secondary | ICD-10-CM | POA: Diagnosis not present

## 2017-01-07 DIAGNOSIS — H2513 Age-related nuclear cataract, bilateral: Secondary | ICD-10-CM | POA: Diagnosis not present

## 2017-01-07 DIAGNOSIS — H5203 Hypermetropia, bilateral: Secondary | ICD-10-CM | POA: Diagnosis not present

## 2017-01-22 ENCOUNTER — Ambulatory Visit (INDEPENDENT_AMBULATORY_CARE_PROVIDER_SITE_OTHER)
Admission: RE | Admit: 2017-01-22 | Discharge: 2017-01-22 | Disposition: A | Discharge status: Home / Self Care | Payer: Medicare Other | Source: Ambulatory Visit | Attending: Internal Medicine | Admitting: Internal Medicine

## 2017-01-22 ENCOUNTER — Ambulatory Visit (INDEPENDENT_AMBULATORY_CARE_PROVIDER_SITE_OTHER): Payer: Medicare Other | Admitting: Internal Medicine

## 2017-01-22 ENCOUNTER — Encounter: Payer: Self-pay | Admitting: Internal Medicine

## 2017-01-22 VITALS — BP 130/68 | HR 58 | Ht 68.0 in | Wt 180.4 lb

## 2017-01-22 DIAGNOSIS — I10 Essential (primary) hypertension: Secondary | ICD-10-CM | POA: Diagnosis not present

## 2017-01-22 DIAGNOSIS — J339 Nasal polyp, unspecified: Secondary | ICD-10-CM

## 2017-01-22 DIAGNOSIS — Z886 Allergy status to analgesic agent status: Secondary | ICD-10-CM | POA: Diagnosis not present

## 2017-01-22 DIAGNOSIS — J45909 Unspecified asthma, uncomplicated: Secondary | ICD-10-CM

## 2017-01-22 DIAGNOSIS — R918 Other nonspecific abnormal finding of lung field: Secondary | ICD-10-CM | POA: Diagnosis not present

## 2017-01-22 DIAGNOSIS — E785 Hyperlipidemia, unspecified: Secondary | ICD-10-CM

## 2017-01-22 NOTE — Progress Notes (Signed)
Subjective:    Patient ID: Antonio Carlson, male    DOB: Apr 09, 1946   MRN: [id number]  Brief patient profile:  71 yowm quit smoking in 1971 with history of Triad Asthma and hyperlipidemia and hbp   Brief patient profile:  06/23/2016 NP  Follow up : HTN, Hyperlipidemia/Triad Asthma  Pt returns for 3 month follow up and med review .  We reviewed all his meds and organized them into a med calendar w/ pt education . He appears to be taking correctly except for flonase , has not been using on regular basis.  Complains of 1 week of nasal congestion , drainage, stuffiness, sneezing, and ear fullness. No fever or sinus pain. Minimal cough .   rec Prednisone taper as directed.  Follow med calendar closely and bring to each visit.  Restart Flonase    08/07/2016  Acute  ov/Dione Mccombie re: nasal congestion Chief Complaint  Patient presents with  . Acute Visit    Pt c/o PND, stuffy ears, and sinus pressure since off and on since Nov 2017. He has had some yellow nasal d/c.    not really all better since November 2017 though improved p prednisone and using afrin/flonase for persistent nasal congstion  Breathing fine and not needing rescue / happy with neb laba/ics  Has action plan for pred for sob or nasal congestion but though he had to call for rx  Rarely needs saba now  rec omnicef 300 mg twice daily x 10 days As per the action plan on your med calendar >  Prednisone 10 mg take  4 each am x 2 days,   2 each am x 2 days,  1 each am x 2 days and stop     10/22/2016  f/u ov/Daruis Swaim re:  hbp/ hyperlipidemia/triad asthma on  Prn prednisone  Chief Complaint  Patient presents with  . Follow-up    Breathing is doing well today. He has had had to use his albuterol inhaler recently.    following med calendar well Not limited by breathing from desired activities   rec Please prednisone and keep up with refills  Call Dr Ralene Muskrat office to see when your yearly follow up is due      01/22/2017  f/u ov/Richanda Darin  re: hbp/ triad asthma/ hyperlipidemia  Chief Complaint  Patient presents with  . Follow-up    Pt has no complaints or concerns. Pt's only issue is sinus drainage but it is not really bothering him.   just finished dose of pred one day prior to OV  But none month of May 2018   Not limited by breathing from desired activities  = very active in [employer] business  No obvious day to day or daytime variability or assoc excess/ purulent sputum or mucus plugs or hemoptysis or cp or chest tightness, subjective wheeze or overt sinus or hb symptoms. No unusual exp hx or h/o childhood pna/ asthma or knowledge of premature birth.  Sleeping ok without nocturnal  or early am exacerbation  of respiratory  c/o's or need for noct saba. Also denies any obvious fluctuation of symptoms with weather or environmental changes or other aggravating or alleviating factors except as outlined above   Current Medications, Allergies, Complete Past Medical History, Past Surgical History, Family History, and Social History were reviewed in [employer] record.  ROS  The following are not active complaints unless bolded sore throat, dysphagia, dental problems, itching, sneezing,  nasal congestion or excess/ purulent secretions,  ear ache,   fever, chills, sweats, unintended wt loss, classically pleuritic or exertional cp,  orthopnea pnd or leg swelling, presyncope, palpitations, abdominal pain, anorexia, nausea, vomiting, diarrhea  or change in bowel or bladder habits, change in stools or urine, dysuria,hematuria,  rash, arthralgias, visual complaints, headache, numbness, weakness or ataxia or problems with walking or coordination,  change in mood/affect or memory.                Past Medical History:  PVCs.  Syncope  - Holter ordered June 27, 2008 > nl  - EP consult June 27, 2008  OSTEOPENIA (ICD-733.90)  COLONIC POLYPS (ICD-211.3) .......................................[name] Kitchen [name] - see colonoscopy 11/07/10 (repeat in 10 yr)  DIVERTICULOSIS, MILD (ICD-562.10) ASTHMA (ICD-493.90)  - HFA 90% June 11, 2009  ALLERGIC RHINITIS, CHRONIC (ICD-477.9).......................  [name]  - steroid dep until mid oct 2009  - Allergy profile sent January 23, 2010 >> IgE 18.3  - Polypectomy Sept 2012 ...................................................... [name]  BENIGN PROSTATIC HYPERTROPHY, HX OF (ICD-V13.8) .[name] Kitchen... 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 [place].......................................................[name] KitchenWert  R C8 radiculopathy 2015 ....................................................... [name]  - CPX 10/22/2016  - Pneumovax 11/2004 and @ age 60 June 11, 2009 and Prevnar 08/14/2013  - Td 07/2005   And  08/01/2015        Past Surgical History:  Appendectomy  Colonoscopy   Family History:  heart disease in his father onset at age 71 he was a smoker  Ca brain half brother siblings healthy but their half Mother dementia onset late 4s lived to be 61    Social History:  quit smoking 1971  rarely drink alcohol  Retired          Objective:   Physical Exam  wt 198 May 17, 2008 >  > 192 07/17/11 >  02/05/2012  191> 05/10/2012  188> 08/10/2012 190> 11/14/2012 180>182 12/12/2012 > 03/14/2013 182 > 07/06/2013 185 > 08/14/2013  184 > 01/31/2014  176 >176 .03/22/2014 >  05/31/2014   181 > 06/11/14  179 > 08/20/2014   184> 02/18/2015  181 > 08/01/2015  178 > 09/16/2015 182  > 12/16/2015   185 > 03/17/2016  185> 08/07/2016   187  > 10/22/2016    183 > 01/22/2017   180    Ambulatory healthy appearing wm in no acute distress  /  Vital signs reviewed  - Note on arrival 02 sats  97% on RA and bp 130/68   HEENT: nl dentition, , and oropharynx. Nl external ear canals without cough reflex - moderate  bilateral non-specific turbinate edema    Neck without JVD/Nodes/TM  Lungs distant insp/exp rhonchi sym bilaterally  without cough on insp or exp maneuvers   RRR no s3 or murmur or increase in P2 / no edema/ nl pulses carotid and feet no bruits Abd soft and benign with nl excursion in the supine position. No bruits or organomegaly  Ext warm without calf tenderness, cyanosis clubbing or edema  Skin warm and dry without lesions  - no rash or ecchymosis  MS nl gait/ no restrictions  Neuro:  Alert/ nl sensorium/ no motor or cerebellar deficits

## 2017-01-22 NOTE — Patient Instructions (Addendum)
Please remember to go to the  x-ray department downstairs in the basement  for your tests with  nipple markers - we will call you with the results when they are available.  Only use your albuterol as a rescue medication to be used if you can't catch your breath by resting or doing a relaxed purse lip breathing pattern.  - The less you use it, the better it will work when you need it. - Ok to use up to 2 puffs  every 4 hours if you must but call for immediate appointment if use goes up over your usual need - Don't leave home without it !!  (think of it like the spare tire for your car)   See calendar for specific medication instructions and bring it back for each and every office visit for every healthcare provider you see.  Without it,  you may not receive the best quality medical care that we feel you deserve.  You will note that the calendar groups together  your maintenance  medications that are timed at particular times of the day.  Think of this as your checklist for what your doctor has instructed you to do until your next evaluation to see what benefit  there is  to staying on a consistent group of medications intended to keep you well.  The other group at the bottom is entirely up to you to use as you see fit  for specific symptoms that may arise between visits that require you to treat them on an as needed basis.  Think of this as your action plan or "what if" list.   Separating the top medications from the bottom group is fundamental to providing you adequate care going forward.     Please schedule a follow up visit in 6  months but call sooner if needed

## 2017-01-22 NOTE — Progress Notes (Signed)
Spoke with pt and notified of results per Dr. [name]. Pt verbalized understanding and denied any questions. 

## 2017-01-23 NOTE — Assessment & Plan Note (Signed)
Adequate control on present rx, reviewed in detail with pt > no change in rx needed   

## 2017-01-23 NOTE — Assessment & Plan Note (Signed)
target < 130 male, pos fm hx, h/o smoking   - 08/11/2011 changed to zocor 40 mg per day    Lfts/ profile reviewed from last ov > Adequate control on present rx, reviewed in detail with pt > no change in rx needed

## 2017-01-23 NOTE — Assessment & Plan Note (Signed)
-  hfa 90% p coaching 11/14/2012  - PFT's  09/16/2015  FEV1 1.81 (61 % ) ratio 56  p 44 % improvement from saba with DLCO  81/81c % corrects to 107  % for alv volume  Done prior to any am meds   All goals of chronic asthma control met including optimal (though certainly not nl) function and elimination of symptoms with minimal need for rescue therapy.  Contingencies discussed in full including contacting this office immediately if not controlling the symptoms using the rule of two's.

## 2017-01-27 ENCOUNTER — Other Ambulatory Visit: Payer: Self-pay | Admitting: Internal Medicine

## 2017-03-19 ENCOUNTER — Telehealth: Payer: Self-pay | Admitting: Internal Medicine

## 2017-03-19 MED ORDER — MONTELUKAST SODIUM 10 MG PO TABS: 10.0000 mg | ORAL_TABLET | Freq: Every evening | ORAL | 1 refills | Status: DC

## 2017-03-19 NOTE — Telephone Encounter (Signed)
Spoke with pt. He is needing a refill on Singulair. Rx has been sent to [facility] per his request. Nothing further was needed.

## 2017-04-10 ENCOUNTER — Other Ambulatory Visit: Payer: Self-pay | Admitting: Internal Medicine

## 2017-05-05 ENCOUNTER — Telehealth: Payer: Self-pay | Admitting: Internal Medicine

## 2017-05-05 MED ORDER — SIMVASTATIN 40 MG PO TABS: ORAL_TABLET | ORAL | 1 refills | Status: DC

## 2017-05-05 NOTE — Telephone Encounter (Signed)
Called and spoke to pt. Pt is requesting refill of Simvastatin 40mg , MW is pt's PCP. Rx sent to preferred pharmacy. Pt verbalized understanding and denied any further questions or concerns at this time.

## 2017-05-08 DIAGNOSIS — Z23 Encounter for immunization: Secondary | ICD-10-CM | POA: Diagnosis not present

## 2017-05-22 ENCOUNTER — Other Ambulatory Visit: Payer: Self-pay | Admitting: Internal Medicine

## 2017-05-25 ENCOUNTER — Telehealth: Payer: Self-pay | Admitting: Internal Medicine

## 2017-05-25 MED ORDER — METOPROLOL TARTRATE 50 MG PO TABS: 25.0000 mg | ORAL_TABLET | Freq: Two times a day (BID) | ORAL | 1 refills | Status: DC

## 2017-05-25 NOTE — Telephone Encounter (Signed)
Rx for Metoprolol has been sent to preferred pharmacy. Nothing further needed.

## 2017-07-29 ENCOUNTER — Encounter: Payer: Self-pay | Admitting: Internal Medicine

## 2017-07-29 ENCOUNTER — Ambulatory Visit (INDEPENDENT_AMBULATORY_CARE_PROVIDER_SITE_OTHER): Payer: Medicare Other | Admitting: Internal Medicine

## 2017-07-29 ENCOUNTER — Other Ambulatory Visit (INDEPENDENT_AMBULATORY_CARE_PROVIDER_SITE_OTHER): Payer: Medicare Other

## 2017-07-29 ENCOUNTER — Telehealth: Payer: Self-pay | Admitting: Internal Medicine

## 2017-07-29 VITALS — BP 132/86 | HR 74 | Ht 68.0 in | Wt 177.4 lb

## 2017-07-29 DIAGNOSIS — J45909 Unspecified asthma, uncomplicated: Secondary | ICD-10-CM

## 2017-07-29 DIAGNOSIS — R05 Cough: Secondary | ICD-10-CM | POA: Diagnosis not present

## 2017-07-29 DIAGNOSIS — Z886 Allergy status to analgesic agent status: Secondary | ICD-10-CM

## 2017-07-29 DIAGNOSIS — I10 Essential (primary) hypertension: Secondary | ICD-10-CM | POA: Diagnosis not present

## 2017-07-29 DIAGNOSIS — E785 Hyperlipidemia, unspecified: Secondary | ICD-10-CM

## 2017-07-29 DIAGNOSIS — J339 Nasal polyp, unspecified: Secondary | ICD-10-CM

## 2017-07-29 DIAGNOSIS — R058 Other specified cough: Secondary | ICD-10-CM

## 2017-07-29 LAB — TSH: TSH: 1.64 u[IU]/mL (ref 0.35–4.50)

## 2017-07-29 LAB — LIPID PANEL
Cholesterol: 188 mg/dL (ref 0–200)
HDL: 51.8 mg/dL (ref >39.00)
LDL Cholesterol: 113 mg/dL — ABNORMAL HIGH (ref 0–99)
NONHDL: 136.34
Total CHOL/HDL Ratio: 4
Triglycerides: 119 mg/dL (ref 0.0–149.0)
VLDL: 23.8 mg/dL (ref 0.0–40.0)

## 2017-07-29 LAB — HEPATIC FUNCTION PANEL
ALK PHOS: 111 U/L (ref 39–117)
ALT: 29 U/L (ref 0–53)
AST: 24 U/L (ref 0–37)
Albumin: 4.3 g/dL (ref 3.5–5.2)
BILIRUBIN DIRECT: 0.1 mg/dL (ref 0.0–0.3)
BILIRUBIN TOTAL: 0.7 mg/dL (ref 0.2–1.2)
Total Protein: 7.3 g/dL (ref 6.0–8.3)

## 2017-07-29 LAB — CBC WITH DIFFERENTIAL/PLATELET
BASOS PCT: 1.3 % (ref 0.0–3.0)
Basophils Absolute: 0.1 10*3/uL (ref 0.0–0.1)
EOS PCT: 1 % (ref 0.0–5.0)
Eosinophils Absolute: 0.1 10*3/uL (ref 0.0–0.7)
HCT: 49.2 % (ref 39.0–52.0)
HEMOGLOBIN: 16.2 g/dL (ref 13.0–17.0)
LYMPHS ABS: 1.7 10*3/uL (ref 0.7–4.0)
Lymphocytes Relative: 16.4 % (ref 12.0–46.0)
MCHC: 32.9 g/dL (ref 30.0–36.0)
MCV: 86.1 fl (ref 78.0–100.0)
MONO ABS: 0.6 10*3/uL (ref 0.1–1.0)
Monocytes Relative: 6.3 % (ref 3.0–12.0)
NEUTROS ABS: 7.6 10*3/uL (ref 1.4–7.7)
Neutrophils Relative %: 75 % (ref 43.0–77.0)
Platelets: 324 10*3/uL (ref 150.0–400.0)
RBC: 5.72 Mil/uL (ref 4.22–5.81)
RDW: 14.9 % (ref 11.5–15.5)
WBC: 10.2 10*3/uL (ref 4.0–10.5)

## 2017-07-29 LAB — BASIC METABOLIC PANEL
BUN: 19 mg/dL (ref 6–23)
CALCIUM: 9.7 mg/dL (ref 8.4–10.5)
CO2: 32 mEq/L (ref 19–32)
Chloride: 99 mEq/L (ref 96–112)
Creatinine, Ser: 0.96 mg/dL (ref 0.40–1.50)
GFR: 81.39 mL/min (ref >60.00)
Glucose, Bld: 96 mg/dL (ref 70–99)
Potassium: 4.4 mEq/L (ref 3.5–5.1)
SODIUM: 139 meq/L (ref 135–145)

## 2017-07-29 MED ORDER — AZITHROMYCIN 250 MG PO TABS: ORAL_TABLET | ORAL | 0 refills | Status: DC

## 2017-07-29 NOTE — Telephone Encounter (Signed)
Pt here for ov today and left his med cal  I called to see if he wants to come pick up or to have me mail it to him  [facility]

## 2017-07-29 NOTE — Progress Notes (Signed)
Subjective:    Patient ID: Antonio Carlson, male    DOB: [birth year]   MRN: [id number]  Brief patient profile:  68 yowm quit smoking in 1971 with history of Triad Asthma and hyperlipidemia and hbp   Brief patient profile:  06/23/2016 NP  Follow up : HTN, Hyperlipidemia/Triad Asthma  Pt returns for 3 month follow up and med review .  We reviewed all his meds and organized them into a med calendar w/ pt education . He appears to be taking correctly except for flonase , has not been using on regular basis.  Complains of 1 week of nasal congestion , drainage, stuffiness, sneezing, and ear fullness. No fever or sinus pain. Minimal cough .   rec Prednisone taper as directed.  Follow med calendar closely and bring to each visit.  Restart Flonase    08/07/2016  Acute  ov/Antonio Carlson re: nasal congestion Chief Complaint  Patient presents with  . Acute Visit    Pt c/o PND, stuffy ears, and sinus pressure since off and on since Nov 2017. He has had some yellow nasal d/c.    not really all better since November 2017 though improved p prednisone and using afrin/flonase for persistent nasal congstion  Breathing fine and not needing rescue / happy with neb laba/ics  Has action plan for pred for sob or nasal congestion but though he had to call for rx  Rarely needs saba now  rec omnicef 300 mg twice daily x 10 days As per the action plan on your med calendar >  Prednisone 10 mg take  4 each am x 2 days,   2 each am x 2 days,  1 each am x 2 days and stop     10/22/2016  f/u ov/Antonio Carlson re:  hbp/ hyperlipidemia/triad asthma on  Prn prednisone  Chief Complaint  Patient presents with  . Follow-up    Breathing is doing well today. He has had had to use his albuterol inhaler recently.    following med calendar well Not limited by breathing from desired activities   rec Please prednisone and keep up with refills  Call Dr Ralene Muskrat office to see when your yearly follow up is due      01/22/2017  f/u ov/Antonio Carlson  re: hbp/ triad asthma/ hyperlipidemia      07/29/2017  f/u ov/Antonio Carlson re: hbp/ triad asthma/ hyperlipidemia  Chief Complaint  Patient presents with  . Follow-up    Pt states he has been doing good since last visit but 1 week ago, pt developed a cough with clear mucus,sore throat, hoarseness, and sinus drainage x1 month. Pt currently on prednisone and will finish taper Saturday, 07/31/17  still not over "cold " onset x 4 weeks prior to OV  But here for regularly scheduled f/u  Not needing saba despite flare of cough relates to pnds / no purulent sputum/ sore throat worse in am   No obvious day to day or daytime variability or assoc  purulent sputum or mucus plugs or hemoptysis or cp or chest tightness, subjective wheeze or overt sinus or hb symptoms. No unusual exposure hx or h/o childhood pna/ asthma or knowledge of premature birth.  Sleeping ok flat without nocturnal  or early am exacerbation  of respiratory  c/o's or need for noct saba. Also denies any obvious fluctuation of symptoms with weather or environmental changes or other aggravating or alleviating factors except as outlined above   Current Allergies, Complete Past Medical History, Past Surgical History, Family  History, and Social History were reviewed in [employer] record.  ROS  The following are not active complaints unless bolded Hoarseness, sore throat, dysphagia, dental problems, itching, sneezing,  nasal congestion or discharge of excess mucus or purulent secretions, ear ache,   fever, chills, sweats, unintended wt loss or wt gain, classically pleuritic or exertional cp,  orthopnea pnd or leg swelling, presyncope, palpitations, abdominal pain, anorexia, nausea, vomiting, diarrhea  or change in bowel habits or change in bladder habits, change in stools or change in urine, dysuria, hematuria,  rash, arthralgias, visual complaints, headache, numbness, weakness or ataxia or problems with walking or coordination,   change in mood/affect or memory.        Current Meds  Medication Sig  . acetaminophen (TYLENOL) 500 MG tablet Take 500 mg by mouth every 6 (six) hours as needed (pain). Per bottle as needed for pain  . acyclovir (ZOVIRAX) 5 % ointment Apply 1 application topically every 3 (three) hours as needed (cold sores). Apply as directed for cold sores  . albuterol (PROAIR HFA) 108 (90 Base) MCG/ACT inhaler 2 puffs every 4 hours as needed only  if your can't catch your breath  . budesonide (PULMICORT) 0.25 MG/2ML nebulizer solution USE ONE VIAL IN NEBULIZER TWICE DAILY  . Calcium Carbonate (CALCIUM 500 PO) Take 1 tablet by mouth daily.   . chlorpheniramine (CHLOR-TRIMETON) 4 MG tablet Take 4 mg by mouth every 4 (four) hours as needed for allergies.  . fluticasone (FLONASE) 50 MCG/ACT nasal spray USE 1 SPRAY IN EACH NOSTRIL EVERY MORNING  AND AT BEDTIME AS NEEDED  . Lactobacillus (DIGESTIVE HEALTH PROBIOTIC) CAPS Take 1 capsule by mouth daily.  [name] Kitchen LORazepam (ATIVAN) 0.5 MG tablet Take 1 tablet (0.5 mg total) by mouth every 8 (eight) hours as needed.  . metoprolol tartrate (LOPRESSOR) 50 MG tablet Take 0.5 tablets (25 mg total) by mouth 2 (two) times daily.  . montelukast (SINGULAIR) 10 MG tablet Take 1 tablet (10 mg total) by mouth every evening.  . Multiple Vitamins-Minerals (CENTRUM) tablet Take 1 tablet by mouth daily.    [name] Kitchen omeprazole (PRILOSEC) 20 MG capsule TAKE ONE CAPSULE BY MOUTH ONCE DAILY.  [name] Kitchen oxymetazoline (AFRIN) 0.05 % nasal spray Place 1 spray into the nose 2 (two) times daily as needed for congestion (x5 days). congestion  . PERFOROMIST 20 MCG/2ML nebulizer solution USE 1 VIAL IN NEBULIZER TWICE DAILY  . predniSONE (DELTASONE) 10 MG tablet Take  4 each am x 2 days,   2 each am x 2 days,  1 each am x 2 days and stop  . simvastatin (ZOCOR) 40 MG tablet TAKE 1 TABLET ONE TIME DAILY IN THE EVENING                   Past Medical History:  PVCs.  Syncope  - Holter ordered June 27, 2008 > nl  - EP consult June 27, 2008  OSTEOPENIA (ICD-733.90)  COLONIC POLYPS (ICD-211.3) .......................................[name] Kitchen [name] - see colonoscopy 11/07/10 (repeat in 10 yr)  DIVERTICULOSIS, MILD (ICD-562.10) ASTHMA (ICD-493.90)  - HFA 90% June 11, 2009  ALLERGIC RHINITIS, CHRONIC (ICD-477.9).......................  [name]  - steroid dep until mid oct 2009  - Allergy profile sent January 23, 2010 >> IgE 18.3  - Polypectomy Sept 2012 ...................................................... [name]  BENIGN PROSTATIC HYPERTROPHY, HX OF (ICD-V13.8) .[name] Kitchen... 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 [place].......................................................[name] KitchenWert  R C8 radiculopathy 2015 ....................................................... [name]  - CPX 10/22/2016  - Pneumovax 11/2004 and @ age 71 June 11, 2009 and Prevnar 08/14/2013  - Td 07/2005   And  08/01/2015        Past Surgical History:  Appendectomy  Colonoscopy   Family History:  heart disease in his father onset at age 16 he was a smoker  Ca brain half brother siblings healthy but their half Mother dementia onset late 22s lived to be 61    Social History:  quit smoking 1971  rarely drink alcohol  Retired          Objective:   Physical Exam  wt 198 May 17, 2008 >  > 192 07/17/11 >  02/05/2012  191> 05/10/2012  188> 08/10/2012 190> 11/14/2012 180>182 12/12/2012 > 03/14/2013 182 > 07/06/2013 185 > 08/14/2013  184 > 01/31/2014  176 >176 .03/22/2014 >  05/31/2014   181 > 06/11/14  179 > 08/20/2014   184> 02/18/2015  181 > 08/01/2015  178 > 09/16/2015 182  > 12/16/2015   185 > 03/17/2016  185> 08/07/2016   187  > 10/22/2016    183 > 01/22/2017   180> 07/29/2017   177   amb wm with nasal tone to voice  Vital signs reviewed - Note on  arrival 02 sats  94% on RA      HEENT: nl dentition,  and oropharynx. Nl external ear canals without cough reflex - moderate bilateral non-specific turbinate edema     NECK :  without JVD/Nodes/TM/ nl carotid upstrokes bilaterally   LUNGS: no acc muscle use,  Nl contour chest with minimal bilateral  insp and exp rhonchi, mostly upper airway    CV:  RRR  no s3 or murmur or increase in P2, and no edema   ABD:  soft and nontender with nl inspiratory excursion in the supine position. No bruits or organomegaly appreciated, bowel sounds nl  MS:  Nl gait/ ext warm without deformities, calf tenderness, cyanosis or clubbing No obvious joint restrictions   SKIN: warm and dry without lesions    NEURO:  alert, approp, nl sensorium with  no motor or cerebellar deficits apparent.         Labs ordered/ reviewed:      Chemistry      Component Value Date/Time   NA 139 07/29/2017 1044   K 4.4 07/29/2017 1044   CL 99 07/29/2017 1044   CO2 32 07/29/2017 1044   BUN 19 07/29/2017 1044   CREATININE 0.96 07/29/2017 1044      Component Value Date/Time   CALCIUM 9.7 07/29/2017 1044   ALKPHOS 111 07/29/2017 1044   AST 24 07/29/2017 1044   ALT 29 07/29/2017 1044   BILITOT 0.7 07/29/2017 1044        Lab Results  Component Value Date   WBC 10.2 07/29/2017   HGB 16.2 07/29/2017   HCT 49.2 07/29/2017   MCV 86.1 07/29/2017   PLT 324.0 07/29/2017        Lab Results  Component Value Date   TSH 1.64 07/29/2017

## 2017-07-29 NOTE — Patient Instructions (Addendum)
When ever any of your respiratory symptoms flare,  add pepcid 20 mg at bedtime   zpak    Please remember to go to the lab department downstairs in the basement  for your tests - we will call you with the results when they are available.      Please schedule a follow up visit in 3 months but call sooner if needed

## 2017-07-30 NOTE — Telephone Encounter (Signed)
Called spoke with patient, he would like his med calendar mailed to him - address verified Med calendar placed in outgoing mail  Nothing further needed; will sign off

## 2017-08-01 ENCOUNTER — Encounter: Payer: Self-pay | Admitting: Internal Medicine

## 2017-08-01 NOTE — Assessment & Plan Note (Signed)
Target LDL  < 130 male, pos fm hx, h/o smoking   - 08/11/2011 changed to zocor 40 mg per day    Lab Results  Component Value Date   CHOL 188 07/29/2017   HDL 51.80 07/29/2017   LDLCALC 113 (H) 07/29/2017   LDLDIRECT 122.0 08/20/2014   TRIG 119.0 07/29/2017   CHOLHDL 4 07/29/2017    Adequate control on present rx, reviewed in detail with pt > no change in rx needed

## 2017-08-01 NOTE — Assessment & Plan Note (Signed)
-  hfa 90% p coaching 11/14/2012  - PFT's  09/16/2015  FEV1 1.81 (61 % ) ratio 56  p 44 % improvement from saba with DLCO  81/81c % corrects to 107  % for alv volume  Done prior to any am meds   Despite  ongoing issues with rhinitis and ? Recurrent sinusitis > All goals of chronic asthma control met including optimal (though not nl) function and elimination of symptoms with minimal need for rescue therapy.  Contingencies discussed in full including contacting this office immediately if not controlling the symptoms using the rule of two's.

## 2017-08-01 NOTE — Assessment & Plan Note (Signed)
-   max gerd rx plus 1st gen H1 added  02/18/2015 >  rx z pak for present flare > f/u ent prn

## 2017-08-01 NOTE — Assessment & Plan Note (Signed)
Lab Results  Component Value Date   CREATININE 0.96 07/29/2017   CREATININE 0.99 10/22/2016   CREATININE 1.02 03/17/2016     Adequate control on present rx, reviewed in detail with pt > no change in rx needed

## 2017-08-04 ENCOUNTER — Telehealth: Payer: Self-pay | Admitting: Internal Medicine

## 2017-08-04 MED ORDER — ALBUTEROL SULFATE HFA 108 (90 BASE) MCG/ACT IN AERS: INHALATION_SPRAY | RESPIRATORY_TRACT | 11 refills | Status: DC

## 2017-08-04 NOTE — Telephone Encounter (Signed)
Spoke with pt. He is needing a refill on Albuterol HFA. Rx has been sent in. Nothing further was needed. 

## 2017-08-09 ENCOUNTER — Other Ambulatory Visit: Payer: Self-pay | Admitting: Internal Medicine

## 2017-09-22 DIAGNOSIS — N401 Enlarged prostate with lower urinary tract symptoms: Secondary | ICD-10-CM | POA: Diagnosis not present

## 2017-09-22 DIAGNOSIS — R3912 Poor urinary stream: Secondary | ICD-10-CM | POA: Diagnosis not present

## 2017-09-22 DIAGNOSIS — R972 Elevated prostate specific antigen [PSA]: Secondary | ICD-10-CM | POA: Diagnosis not present

## 2017-09-22 DIAGNOSIS — N4340 Spermatocele of epididymis, unspecified: Secondary | ICD-10-CM | POA: Diagnosis not present

## 2017-10-13 ENCOUNTER — Other Ambulatory Visit: Payer: Self-pay | Admitting: Internal Medicine

## 2017-10-28 ENCOUNTER — Encounter: Payer: Self-pay | Admitting: Internal Medicine

## 2017-10-28 ENCOUNTER — Ambulatory Visit (INDEPENDENT_AMBULATORY_CARE_PROVIDER_SITE_OTHER): Payer: Medicare Other | Admitting: Internal Medicine

## 2017-10-28 DIAGNOSIS — J339 Nasal polyp, unspecified: Secondary | ICD-10-CM

## 2017-10-28 DIAGNOSIS — J45909 Unspecified asthma, uncomplicated: Secondary | ICD-10-CM

## 2017-10-28 DIAGNOSIS — Z886 Allergy status to analgesic agent status: Secondary | ICD-10-CM

## 2017-10-28 DIAGNOSIS — I10 Essential (primary) hypertension: Secondary | ICD-10-CM

## 2017-10-28 MED ORDER — LORAZEPAM 0.5 MG PO TABS: 0.5000 mg | ORAL_TABLET | Freq: Three times a day (TID) | ORAL | 2 refills | Status: DC | PRN

## 2017-10-28 NOTE — Progress Notes (Signed)
Subjective:    Patient ID: Antonio Carlson, male    DOB: 74/24/1947   MRN: [id number]  Brief patient profile:  72 yowm quit smoking in 1971 with history of Triad Asthma and hyperlipidemia and hbp   Brief patient profile:  74/28/2017 NP  Follow up : HTN, Hyperlipidemia/Triad Asthma  Pt returns for 3 month follow up and med review .  We reviewed all his meds and organized them into a med calendar w/ pt education . He appears to be taking correctly except for flonase , has not been using on regular basis.  Complains of 1 week of nasal congestion , drainage, stuffiness, sneezing, and ear fullness. No fever or sinus pain. Minimal cough .   rec Prednisone taper as directed.  Follow med calendar closely and bring to each visit.  Restart Flonase    08/07/2016  Acute  ov/Jonn Chaikin re: nasal congestion Chief Complaint  Patient presents with  . Acute Visit    Pt c/o PND, stuffy ears, and sinus pressure since off and on since Nov 2017. He has had some yellow nasal d/c.    not really all better since November 2017 though improved p prednisone and using afrin/flonase for persistent nasal congstion  Breathing fine and not needing rescue / happy with neb laba/ics  Has action plan for pred for sob or nasal congestion but though he had to call for rx  Rarely needs saba now  rec omnicef 300 mg twice daily x 10 days As per the action plan on your med calendar >  Prednisone 10 mg take  4 each am x 2 days,   2 each am x 2 days,  1 each am x 2 days and stop     10/22/2016  f/u ov/Kamori Barbier re:  hbp/ hyperlipidemia/triad asthma on  Prn prednisone  Chief Complaint  Patient presents with  . Follow-up    Breathing is doing well today. He has had had to use his albuterol inhaler recently.    following med calendar well Not limited by breathing from desired activities   rec Please prednisone and keep up with refills  Call Dr Ralene Muskrat office to see when your yearly follow up is due      01/22/2017  f/u ov/Sherlonda Flater  re: hbp/ triad asthma/ hyperlipidemia      07/29/2017  f/u ov/Linzy Laury re: hbp/ triad asthma/ hyperlipidemia  Chief Complaint  Patient presents with  . Follow-up    Pt states he has been doing good since last visit but 1 week ago, pt developed a cough with clear mucus,sore throat, hoarseness, and sinus drainage x1 month. Pt currently on prednisone and will finish taper Saturday, 07/31/17  still not over "cold " onset x 4 weeks prior to OV  But here for regularly scheduled f/u  Not needing saba despite flare of cough relates to pnds / no purulent sputum/ sore throat worse in am  rec When ever any of your respiratory symptoms flare,  add pepcid 20 mg at bedtime  zpak  Please remember to go to the lab department downstairs in the basement  for your tests - we will call you with the results when they are available    10/28/2017  f/u ov/Garry Bochicchio re: hbp/triad asthma Chief Complaint  Patient presents with  . Follow-up  Dyspnea:  Not limited by breathing from desired activities   Cough: no Sleep: ok SABA use:  None   No obvious day to day or daytime variability or assoc excess/ purulent sputum  or mucus plugs or hemoptysis or cp or chest tightness, subjective wheeze or overt sinus or hb symptoms. No unusual exposure hx or h/o childhood pna/ asthma or knowledge of premature birth.  Sleeping ok flat without nocturnal  or early am exacerbation  of respiratory  c/o's or need for noct saba. Also denies any obvious fluctuation of symptoms with weather or environmental changes or other aggravating or alleviating factors except as outlined above   Current Allergies, Complete Past Medical History, Past Surgical History, Family History, and Social History were reviewed in [employer] record.  ROS  The following are not active complaints unless bolded Hoarseness, sore throat, dysphagia, dental problems, itching, sneezing,  nasal congestion or discharge of excess mucus or purulent secretions,  ear ache,   fever, chills, sweats, unintended wt loss or wt gain, classically pleuritic or exertional cp,  orthopnea pnd or leg swelling, presyncope, palpitations, abdominal pain, anorexia, nausea, vomiting, diarrhea  or change in bowel habits or change in bladder habits, change in stools or change in urine, dysuria, hematuria,  rash, arthralgias, visual complaints, headache, numbness, weakness or ataxia or problems with walking or coordination,  change in mood/affect or memory.        Current Meds  Medication Sig  . acetaminophen (TYLENOL) 500 MG tablet Take 500 mg by mouth every 6 (six) hours as needed (pain). Per bottle as needed for pain  . acyclovir (ZOVIRAX) 5 % ointment Apply 1 application topically every 3 (three) hours as needed (cold sores). Apply as directed for cold sores  . albuterol (PROAIR HFA) 108 (90 Base) MCG/ACT inhaler 2 puffs every 4 hours as needed only  if your can't catch your breath  . budesonide (PULMICORT) 0.25 MG/2ML nebulizer solution USE ONE VIAL IN NEBULIZER TWICE DAILY  . Calcium Carbonate (CALCIUM 500 PO) Take 1 tablet by mouth daily.   . chlorpheniramine (CHLOR-TRIMETON) 4 MG tablet Take 4 mg by mouth every 4 (four) hours as needed for allergies.  . fluticasone (FLONASE) 50 MCG/ACT nasal spray USE 1 SPRAY IN EACH NOSTRIL EVERY MORNING  AND AT BEDTIME AS NEEDED  . Lactobacillus (DIGESTIVE HEALTH PROBIOTIC) CAPS Take 1 capsule by mouth daily.  [name] Kitchen LORazepam (ATIVAN) 0.5 MG tablet Take 1 tablet (0.5 mg total) by mouth every 8 (eight) hours as needed.  . metoprolol tartrate (LOPRESSOR) 50 MG tablet TAKE 1/2 TABLET (25 MG TOTAL) BY MOUTH 2 TIMES DAILY.  . montelukast (SINGULAIR) 10 MG tablet TAKE 1 TABLET EVERY EVENING  . Multiple Vitamins-Minerals (CENTRUM) tablet Take 1 tablet by mouth daily.    [name] Kitchen omeprazole (PRILOSEC) 20 MG capsule TAKE ONE CAPSULE BY MOUTH ONCE DAILY.  [name] Kitchen oxymetazoline (AFRIN) 0.05 % nasal spray Place 1 spray into the nose 2 (two) times daily as needed  for congestion (x5 days). congestion  . PERFOROMIST 20 MCG/2ML nebulizer solution USE 1 VIAL IN NEBULIZER TWICE DAILY  . simvastatin (ZOCOR) 40 MG tablet TAKE 1 TABLET ONE TIME DAILY IN THE EVENING  . [  LORazepam (ATIVAN) 0.5 MG tablet Take 1 tablet (0.5 mg total) by mouth every 8 (eight) hours as needed.             PMH PVCs.  Syncope  - Holter ordered June 27, 2008 > nl  - EP consult June 27, 2008  OSTEOPENIA (ICD-733.90)  COLONIC POLYPS (ICD-211.3) .......................................[name] Kitchen [name] - see colonoscopy 11/07/10 (repeat in 10 yr)  DIVERTICULOSIS, MILD (ICD-562.10) ASTHMA (ICD-493.90)  - HFA 90% June 11, 2009  ALLERGIC RHINITIS,  CHRONIC (ICD-477.9).......................  [name]  - steroid dep until mid oct 2009  - Allergy profile sent January 23, 2010 >> IgE 18.3  - Polypectomy Sept 2012 ...................................................... [name]  BENIGN PROSTATIC HYPERTROPHY, HX OF (ICD-V13.8) .[name] Kitchen... 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 [place].......................................................[name] KitchenWert  R C8 radiculopathy 2015 ....................................................... [name]  - CPX 10/22/2016  - Pneumovax 11/2004 and @ age 4 June 11, 2009 and Prevnar 08/14/2013  - Td 07/2005   And  08/01/2015        Past Surgical History:  Appendectomy  Colonoscopy   Family History:  heart disease in his father onset at age 71 he was a smoker  Ca brain half brother siblings healthy but their half Mother dementia onset late 60s lived to be 24    Social History:  quit smoking 1971  rarely drink alcohol  Retired          Objective:   Physical Exam  wt 198 May 17, 2008 >  > 192 07/17/11 >  02/05/2012  191> 05/10/2012  188> 08/10/2012 190> 11/14/2012  180>182 12/12/2012 > 03/14/2013 182 > 07/06/2013 185 > 08/14/2013  184 > 01/31/2014  176 >176 .03/22/2014 >  05/31/2014   181 > 06/11/14  179 > 08/20/2014   184> 02/18/2015  181 > 08/01/2015  178 > 09/16/2015 182  > 12/16/2015   185 > 03/17/2016  185> 08/07/2016   187  > 10/22/2016    183 > 01/22/2017   180> 07/29/2017   177 > 10/28/2017  169     amb wm nasal tone to voice   Vital signs reviewed - Note on arrival 02 sats  98% on RA  And BP 124/78       HEENT: nl dentition, and oropharynx. Nl external ear canals without cough reflex - moderate bilateral non-specific turbinate edema     NECK :  without JVD/Nodes/TM/ nl carotid upstrokes bilaterally   LUNGS: no acc muscle use,  Nl contour chest with a few wheezes/ rhonchi  bilaterally without cough on insp or exp maneuvers   CV:  RRR  no s3 or murmur or increase in P2, and no edema   ABD:  soft and nontender with nl inspiratory excursion in the supine position. No bruits or organomegaly appreciated, bowel sounds nl  MS:  Nl gait/ ext warm without deformities, calf tenderness, cyanosis or clubbing No obvious joint restrictions   SKIN: warm and dry without lesions    NEURO:  alert, approp, nl sensorium with  no motor or cerebellar deficits apparent.

## 2017-10-28 NOTE — Patient Instructions (Signed)
See calendar for specific medication instructions and bring it back for each and every office visit for every healthcare provider you see.  Without it,  you may not receive the best quality medical care that we feel you deserve.  You will note that the calendar groups together  your maintenance  medications that are timed at particular times of the day.  Think of this as your checklist for what your doctor has instructed you to do until your next evaluation to see what benefit  there is  to staying on a consistent group of medications intended to keep you well.  The other group at the bottom is entirely up to you to use as you see fit  for specific symptoms that may arise between visits that require you to treat them on an as needed basis.  Think of this as your action plan or "what if" list.   Separating the top medications from the bottom group is fundamental to providing you adequate care going forward.    See [name] w/in 3 months  with all your medications, even over the counter meds, separated in two separate bags, the ones you take no matter what vs the ones you stop once you feel better and take only as needed when you feel you need them.   Tammy  will generate for you a new user friendly medication calendar that will put [place] all on the same page re: your medication use.

## 2017-11-01 ENCOUNTER — Other Ambulatory Visit: Payer: Self-pay | Admitting: Internal Medicine

## 2017-11-01 ENCOUNTER — Encounter: Payer: Self-pay | Admitting: Internal Medicine

## 2017-11-01 NOTE — Assessment & Plan Note (Signed)
Adequate control on present rx, reviewed in detail with pt > no change in rx needed   

## 2017-11-01 NOTE — Assessment & Plan Note (Signed)
Mild flare for which he has pred short course already on action plan portion of med calendar    Each maintenance medication was reviewed in detail including most importantly the difference between maintenance and as needed and under what circumstances the prns are to be used. This was done in the context of a medication calendar review which provided the patient with a user-friendly unambiguous mechanism for medication administration and reconciliation and provides an action plan for all active problems. It is critical that this be shown to every doctor  for modification during the office visit if necessary so the patient can use it as a working document.

## 2017-11-08 ENCOUNTER — Other Ambulatory Visit: Payer: Self-pay | Admitting: Internal Medicine

## 2017-11-08 DIAGNOSIS — Z886 Allergy status to analgesic agent status: Principal | ICD-10-CM

## 2017-11-08 DIAGNOSIS — J339 Nasal polyp, unspecified: Secondary | ICD-10-CM

## 2017-11-08 DIAGNOSIS — J45909 Unspecified asthma, uncomplicated: Principal | ICD-10-CM

## 2017-11-08 DIAGNOSIS — R3912 Poor urinary stream: Secondary | ICD-10-CM | POA: Diagnosis not present

## 2017-11-08 DIAGNOSIS — N401 Enlarged prostate with lower urinary tract symptoms: Secondary | ICD-10-CM | POA: Diagnosis not present

## 2017-11-08 DIAGNOSIS — R972 Elevated prostate specific antigen [PSA]: Secondary | ICD-10-CM | POA: Diagnosis not present

## 2017-11-08 MED ORDER — PREDNISONE 10 MG PO TABS
ORAL_TABLET | ORAL | 11 refills | Status: DC
Start: 2017-11-08 — End: 2018-11-23

## 2017-12-13 ENCOUNTER — Other Ambulatory Visit: Payer: Self-pay | Admitting: Internal Medicine

## 2017-12-13 ENCOUNTER — Telehealth: Payer: Self-pay | Admitting: Internal Medicine

## 2017-12-13 MED ORDER — FORMOTEROL FUMARATE 20 MCG/2ML IN NEBU: 20.0000 ug | INHALATION_SOLUTION | Freq: Two times a day (BID) | RESPIRATORY_TRACT | 5 refills | Status: DC

## 2017-12-13 NOTE — Telephone Encounter (Signed)
Spoke with pt and advised rx sent to pharmacy. Nothing further is needed.   

## 2017-12-15 ENCOUNTER — Other Ambulatory Visit: Payer: Self-pay | Admitting: Internal Medicine

## 2018-01-10 DIAGNOSIS — H2513 Age-related nuclear cataract, bilateral: Secondary | ICD-10-CM | POA: Diagnosis not present

## 2018-01-10 DIAGNOSIS — H5203 Hypermetropia, bilateral: Secondary | ICD-10-CM | POA: Diagnosis not present

## 2018-01-28 ENCOUNTER — Encounter: Payer: Medicare Other | Admitting: Adult Health

## 2018-02-07 ENCOUNTER — Encounter: Payer: Medicare Other | Admitting: Adult Health

## 2018-02-08 ENCOUNTER — Encounter: Payer: Self-pay | Admitting: Adult Health

## 2018-02-08 ENCOUNTER — Ambulatory Visit (INDEPENDENT_AMBULATORY_CARE_PROVIDER_SITE_OTHER): Payer: Medicare Other | Admitting: Adult Health

## 2018-02-08 DIAGNOSIS — J45909 Unspecified asthma, uncomplicated: Secondary | ICD-10-CM

## 2018-02-08 DIAGNOSIS — J339 Nasal polyp, unspecified: Secondary | ICD-10-CM

## 2018-02-08 DIAGNOSIS — I10 Essential (primary) hypertension: Secondary | ICD-10-CM | POA: Diagnosis not present

## 2018-02-08 DIAGNOSIS — Z886 Allergy status to analgesic agent status: Secondary | ICD-10-CM | POA: Diagnosis not present

## 2018-02-08 NOTE — Assessment & Plan Note (Signed)
Compensated on present regimen Patient's medications were reviewed today and patient education was given. Computerized medication calendar was adjusted/completed   PLAN  Patient Instructions  Continue on current regimen  Follow med calendar closely and bring to each visit.  Follow up Dr. Melvyn Novas  In 4 months and As needed

## 2018-02-08 NOTE — Progress Notes (Signed)
@Patient  ID: Antonio Carlson, male    DOB: Jul 21, 1946, 72 y.o.   MRN: [id number]  Chief Complaint  Patient presents with  . Follow-up    Asthma    Referring provider: [name], MD  HPI: 72 yo male former smoker followed for Triad Asthma, HTN , and Hyperlipidemia   TEST   PFT's  09/16/2015  FEV1 1.81 (61 % ) ratio 56  p 44 % improvement from saba with DLCO  81/81c % corrects to 107    02/08/2018 Follow up : [facility] Ast[facility] , HTN and Med Review  Patient presents for a 12-month follow-up.  Says overall his breathing has been doing well.  He denies any increased cough or wheezing. Had flare with sinus issues few weeks ago, took steroid course and resolved .  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   B/p doing well . Running good lately. No headache .  Remains on metoprolol .   We reviewed all his medications and organize them into a medication count with patient education.  Appears he is taking his medications correctly  Allergies  Allergen Reactions  . Aspirin     REACTION: sob  . Amoxicillin     REACTION: gi upset  . Amoxicillin-Pot Clavulanate     REACTION: gi upset  . Sulfonamide Derivatives Other (See Comments)    Doesn't remember    Immunization History  Administered Date(s) Administered  . Influenza Split 07/17/2011, 05/10/2012, 04/26/2014  . Influenza Whole 05/17/2008, 06/11/2009  . Influenza, High Dose Seasonal PF 05/08/2017  . Influenza,inj,Quad PF,6+ Mos 08/14/2013, 04/29/2015  . Influenza-Unspecified 04/27/2016  . Pneumococcal Conjugate-13 08/14/2013  . Pneumococcal Polysaccharide-23 06/11/2009  . Tdap 08/01/2015    Past Medical History:  Diagnosis Date  . Allergy   . Asthma   . Benign prostatic hypertrophy   . Diverticulosis   . GERD (gastroesophageal reflux disease)   . Hyperlipidemia   . Hypertension   . Nephrolithiasis   . PVC's (premature ventricular contractions)   . Syncope     Tobacco History: Social History   Tobacco Use  Smoking Status  Former Smoker  . Packs/day: 2.00  . Years: 14.00  . Pack years: 28.00  . Last attempt to quit: 07/27/1969  . Years since quitting: 48.5  Smokeless Tobacco Never Used   Counseling given: Not Answered   Outpatient Medications Prior to Visit  Medication Sig Dispense Refill  . acetaminophen (TYLENOL) 500 MG tablet Take 500 mg by mouth every 6 (six) hours as needed (pain). Per bottle as needed for pain    . acyclovir (ZOVIRAX) 5 % ointment Apply 1 application topically every 3 (three) hours as needed (cold sores). Apply as directed for cold sores    . albuterol (PROAIR HFA) 108 (90 Base) MCG/ACT inhaler 2 puffs every 4 hours as needed only  if your can't catch your breath 1 Inhaler 11  . azithromycin (ZITHROMAX) 250 MG tablet Take 2 on day one then 1 daily x 4 days 6 tablet 0  . budesonide (PULMICORT) 0.25 MG/2ML nebulizer solution USE ONE VIAL IN NEBULIZER TWICE DAILY 120 mL 11  . Calcium Carbonate (CALCIUM 500 PO) Take 1 tablet by mouth daily.     . chlorpheniramine (CHLOR-TRIMETON) 4 MG tablet Take 4 mg by mouth every 4 (four) hours as needed for allergies.    . fluticasone (FLONASE) 50 MCG/ACT nasal spray USE 1 SPRAY IN EACH NOSTRIL EVERY MORNING  AND AT BEDTIME AS NEEDED 48 g 1  .  formoterol (PERFOROMIST) 20 MCG/2ML nebulizer solution Take 2 mLs (20 mcg total) by nebulization 2 (two) times daily. 120 mL 5  . Lactobacillus (DIGESTIVE HEALTH PROBIOTIC) CAPS Take 1 capsule by mouth daily.    [name] Kitchen LORazepam (ATIVAN) 0.5 MG tablet Take 1 tablet (0.5 mg total) by mouth every 8 (eight) hours as needed. 60 tablet 2  . metoprolol tartrate (LOPRESSOR) 50 MG tablet TAKE 1/2 TABLET (25 MG TOTAL) BY MOUTH 2 TIMES DAILY. 90 tablet 1  . montelukast (SINGULAIR) 10 MG tablet TAKE 1 TABLET EVERY EVENING 90 tablet 1  . Multiple Vitamins-Minerals (CENTRUM) tablet Take 1 tablet by mouth daily.      [name] Kitchen omeprazole (PRILOSEC) 20 MG capsule TAKE ONE CAPSULE BY MOUTH ONCE DAILY. 90 capsule 3  . oxymetazoline (AFRIN)  0.05 % nasal spray Place 1 spray into the nose 2 (two) times daily as needed for congestion (x5 days). congestion    . predniSONE (DELTASONE) 10 MG tablet Take  4 each am x 2 days,   2 each am x 2 days,  1 each am x 2 days and stop 14 tablet 11  . simvastatin (ZOCOR) 40 MG tablet TAKE 1 TABLET ONE TIME DAILY IN THE EVENING 90 tablet 1   No facility-administered medications prior to visit.      Review of Systems  Constitutional:   No  weight loss, night sweats,  Fevers, chills, fatigue, or  lassitude.  HEENT:   No headaches,  Difficulty swallowing,  Tooth/dental problems, or  Sore throat,                No sneezing, itching, ear ache, +nasal congestion, post nasal drip,   CV:  No chest pain,  Orthopnea, PND, swelling in lower extremities, anasarca, dizziness, palpitations, syncope.   GI  No heartburn, indigestion, abdominal pain, nausea, vomiting, diarrhea, change in bowel habits, loss of appetite, bloody stools.   Resp:   No excess mucus, no productive cough,  No non-productive cough,  No coughing up of blood.  No change in color of mucus.  No wheezing.  No chest wall deformity  Skin: no rash or lesions.  GU: no dysuria, change in color of urine, no urgency or frequency.  No flank pain, no hematuria   MS:  No joint pain or swelling.  No decreased range of motion.  No back pain.    Physical Exam  BP 108/68 (BP Location: Left Arm, Cuff Size: Normal)   Pulse 77   Ht 5\' 8"  (1.727 m)   Wt 178 lb 3.2 oz (80.8 kg)   SpO2 97%   BMI 27.10 kg/m   GEN: A/Ox3; pleasant , NAD, well nourished , elderly    HEENT:  [place]/AT,  EACs-clear, TMs-wnl, NOSE-clear, THROAT-clear, no lesions, no postnasal drip or exudate noted.   NECK:  Supple w/ fair ROM; no JVD; normal carotid impulses w/o bruits; no thyromegaly or nodules palpated; no lymphadenopathy.    RESP  Clear  P & A; w/o, wheezes/ rales/ or rhonchi. no accessory muscle use, no dullness to percussion  CARD:  RRR, no m/r/g, no peripheral  edema, pulses intact, no cyanosis or clubbing.  GI:   Soft & nt; nml bowel sounds; no organomegaly or masses detected.   Musco: Warm bil, no deformities or joint swelling noted.   Neuro: alert, no focal deficits noted.    Skin: Warm, no lesions or rashes    Lab Results:  CBC    Component Value Date/Time   WBC 10.2  07/29/2017 1044   RBC 5.72 07/29/2017 1044   HGB 16.2 07/29/2017 1044   HCT 49.2 07/29/2017 1044   PLT 324.0 07/29/2017 1044   MCV 86.1 07/29/2017 1044   MCH 28.9 06/03/2014 0856   MCHC 32.9 07/29/2017 1044   RDW 14.9 07/29/2017 1044   LYMPHSABS 1.7 07/29/2017 1044   MONOABS 0.6 07/29/2017 1044   EOSABS 0.1 07/29/2017 1044   BASOSABS 0.1 07/29/2017 1044    BMET    Component Value Date/Time   NA 139 07/29/2017 1044   K 4.4 07/29/2017 1044   CL 99 07/29/2017 1044   CO2 32 07/29/2017 1044   GLUCOSE 96 07/29/2017 1044   BUN 19 07/29/2017 1044   CREATININE 0.96 07/29/2017 1044   CALCIUM 9.7 07/29/2017 1044   GFRNONAA 90 (L) 06/03/2014 0856   GFRAA >90 06/03/2014 0856    BNP No results found for: BNP  ProBNP No results found for: PROBNP  Imaging: No results found.   Assessment & Plan:   Triad asthma Compensated on present regimen Patient's medications were reviewed today and patient education was given. Computerized medication calendar was adjusted/completed   PLAN  Patient Instructions  Continue on current regimen  Follow med calendar closely and bring to each visit.  Follow up Dr. Melvyn Novas  In 4 months and As needed         Essential hypertension Well controlled on present regimen   Plan  No changes  Low salt diet       Tammy Parrett, NP 02/08/2018

## 2018-02-08 NOTE — Patient Instructions (Signed)
Continue on current regimen  Follow med calendar closely and bring to each visit.  Follow up Dr. Melvyn Novas  In 4 months and As needed

## 2018-02-08 NOTE — Assessment & Plan Note (Signed)
Well controlled on present regimen   Plan  No changes  Low salt diet

## 2018-02-09 ENCOUNTER — Other Ambulatory Visit: Payer: Self-pay | Admitting: Internal Medicine

## 2018-02-09 NOTE — Progress Notes (Signed)
Chart and office note reviewed in detail  > agree with a/p as outlined    

## 2018-02-11 NOTE — Addendum Note (Signed)
Addended by: Parke Poisson E on: 02/11/2018 11:19 AM   Modules accepted: Orders

## 2018-04-08 ENCOUNTER — Other Ambulatory Visit: Payer: Self-pay | Admitting: Internal Medicine

## 2018-04-13 ENCOUNTER — Other Ambulatory Visit: Payer: Self-pay | Admitting: Internal Medicine

## 2018-04-13 MED ORDER — OMEPRAZOLE 20 MG PO CPDR: 20.0000 mg | DELAYED_RELEASE_CAPSULE | Freq: Every day | ORAL | 3 refills | Status: DC

## 2018-04-23 ENCOUNTER — Other Ambulatory Visit: Payer: Self-pay | Admitting: Internal Medicine

## 2018-05-05 DIAGNOSIS — Z23 Encounter for immunization: Secondary | ICD-10-CM | POA: Diagnosis not present

## 2018-05-09 DIAGNOSIS — R972 Elevated prostate specific antigen [PSA]: Secondary | ICD-10-CM | POA: Diagnosis not present

## 2018-05-09 DIAGNOSIS — J300 Vasomotor rhinitis: Secondary | ICD-10-CM | POA: Diagnosis not present

## 2018-05-09 DIAGNOSIS — H66003 Acute suppurative otitis media without spontaneous rupture of ear drum, bilateral: Secondary | ICD-10-CM | POA: Diagnosis not present

## 2018-05-09 DIAGNOSIS — J018 Other acute sinusitis: Secondary | ICD-10-CM | POA: Diagnosis not present

## 2018-05-18 DIAGNOSIS — N401 Enlarged prostate with lower urinary tract symptoms: Secondary | ICD-10-CM | POA: Diagnosis not present

## 2018-05-18 DIAGNOSIS — R972 Elevated prostate specific antigen [PSA]: Secondary | ICD-10-CM | POA: Diagnosis not present

## 2018-05-18 DIAGNOSIS — R3912 Poor urinary stream: Secondary | ICD-10-CM | POA: Diagnosis not present

## 2018-05-26 ENCOUNTER — Other Ambulatory Visit: Payer: Self-pay | Admitting: Internal Medicine

## 2018-05-26 MED ORDER — BUDESONIDE 0.25 MG/2ML IN SUSP: RESPIRATORY_TRACT | 11 refills | Status: DC

## 2018-05-26 MED ORDER — FORMOTEROL FUMARATE 20 MCG/2ML IN NEBU: 20.0000 ug | INHALATION_SOLUTION | Freq: Two times a day (BID) | RESPIRATORY_TRACT | 11 refills | Status: DC

## 2018-05-30 ENCOUNTER — Telehealth: Payer: Self-pay | Admitting: Internal Medicine

## 2018-05-30 MED ORDER — BUDESONIDE 0.25 MG/2ML IN SUSP: RESPIRATORY_TRACT | 5 refills | Status: DC

## 2018-05-30 MED ORDER — FORMOTEROL FUMARATE 20 MCG/2ML IN NEBU: 20.0000 ug | INHALATION_SOLUTION | Freq: Two times a day (BID) | RESPIRATORY_TRACT | 5 refills | Status: DC

## 2018-05-30 NOTE — Telephone Encounter (Signed)
Medication name and strength: Perforomist 52mcg/2mL Provider: Dr. Melvyn Novas [facility]acy: [facility] [address] Patient insurance ID: [id number] Phone: [phone] Fax: [phone]  Was the PA started on CMM?  Yes If yes, please enter the Key: [id number] Timeframe for approval/denial: 24-72hrs   Will route to [facility] for follow up.

## 2018-05-30 NOTE — Telephone Encounter (Signed)
These medications should be covered under Medicare part B. Called pharmacy, states that since the rx was a transferred prescription they cannot run it under part B- needs a new rx with dx code sent directly to them.  New rx's have been sent to pharmacy.    lmtcb X1 for pt to make aware that rx's should be ready for pickup at [facility]

## 2018-05-30 NOTE — Telephone Encounter (Signed)
Patient calling from [facility].  Needs authorization for two meds Perforomist & Budesonide for nebulizer. Pharmacy is [facility] phone number [phone], fax number [phone].

## 2018-06-01 NOTE — Telephone Encounter (Signed)
LMTCB

## 2018-06-08 ENCOUNTER — Telehealth: Payer: Self-pay | Admitting: Internal Medicine

## 2018-06-08 NOTE — Telephone Encounter (Signed)
Medication name and strength: Perforomist 70mcg/2ML nebulizer solution  Provider: [facility] Pharmacy: [place] Patient insurance ID: [id number] Phone: [phone]  Was the PA started on CMM?  yes If yes, please enter the Key: [id number] PA Case ID: [id number] - Rx #: [id number]  Timeframe for approval/denial: Takes 3-5 days for determination   Routing to [place] to f/u with PA

## 2018-06-16 ENCOUNTER — Encounter: Payer: Self-pay | Admitting: Internal Medicine

## 2018-06-16 ENCOUNTER — Ambulatory Visit: Payer: Medicare Other | Admitting: Internal Medicine

## 2018-06-16 ENCOUNTER — Ambulatory Visit (INDEPENDENT_AMBULATORY_CARE_PROVIDER_SITE_OTHER): Payer: Medicare Other | Admitting: Internal Medicine

## 2018-06-16 VITALS — BP 126/78 | HR 75 | Ht 68.0 in | Wt 175.0 lb

## 2018-06-16 DIAGNOSIS — J339 Nasal polyp, unspecified: Secondary | ICD-10-CM

## 2018-06-16 DIAGNOSIS — J309 Allergic rhinitis, unspecified: Secondary | ICD-10-CM

## 2018-06-16 DIAGNOSIS — J45909 Unspecified asthma, uncomplicated: Secondary | ICD-10-CM | POA: Diagnosis not present

## 2018-06-16 DIAGNOSIS — I10 Essential (primary) hypertension: Secondary | ICD-10-CM | POA: Diagnosis not present

## 2018-06-16 DIAGNOSIS — Z886 Allergy status to analgesic agent status: Secondary | ICD-10-CM | POA: Diagnosis not present

## 2018-06-16 NOTE — Progress Notes (Signed)
Subjective:    Patient ID: Antonio Carlson, male    DOB: 10-22-1945   MRN: [id number]  Brief patient profile:  72 yowm quit smoking in 1971 with history of Tri[facility] Asthma and hyperlipidemia and hbp   Brief patient profile:  06/23/2016 NP  Follow up : HTN, Hyperlipidemia/Triad Asthma  Pt returns for 3 month follow up and med review .  We reviewed all his meds and organized them into a med calendar w/ pt education . He appears to be taking correctly except for flonase , has not been using on regular basis.  Complains of 1 week of nasal congestion , drainage, stuffiness, sneezing, and ear fullness. No fever or sinus pain. Minimal cough .   rec Prednisone taper as directed.  Follow med calendar closely and bring to each visit.  Restart Flonase     10/28/2017  f/u ov/Stefany Starace re: hbp/triad asthma Chief Complaint  Patient presents with  . Follow-up  Dyspnea:  Not limited by breathing from desired activities   Cough: no Sleep: ok SABA use:  None rec Follow med calendar   06/16/2018  f/u ov/Ravynn Hogate re: triad asthma/ hbp / following med cal well  Chief Complaint  Patient presents with  . Follow-up    doing well. no complaints today.  Dyspnea:  Not limited by breathing from desired activities  / some worse in cold air Cough: occ assoc with pnds  Day > noct Sleeping: flat bed, one pillow SABA use: very little 02: none  No obvious day to day or daytime variability or assoc excess/ purulent sputum or mucus plugs or hemoptysis or cp or chest tightness, subjective wheeze or overt sinus or hb symptoms.   Sleeping as abover  without nocturnal  or early am exacerbation  of respiratory  c/o's or need for noct saba. Also denies any obvious fluctuation of symptoms with weather or environmental changes or other aggravating or alleviating factors except as outlined above   No unusual exposure hx or h/o childhood pna/ asthma or knowledge of premature birth.  Current Allergies, Complete Past Medical  History, Past Surgical History, Family History, and Social History were reviewed in [employer] record.  ROS  The following are not active complaints unless bolded Hoarseness, sore throat, dysphagia, dental problems, itching, sneezing,  nasal congestion or discharge of excess mucus or purulent secretions, ear ache,   fever, chills, sweats, unintended wt loss or wt gain, classically pleuritic or exertional cp,  orthopnea pnd or arm/hand swelling  or leg swelling, presyncope, palpitations, abdominal pain, anorexia, nausea, vomiting, diarrhea  or change in bowel habits or change in bladder habits, change in stools or change in urine, dysuria, hematuria,  rash, arthralgias, visual complaints, headache, numbness, weakness or ataxia or problems with walking or coordination,  change in mood or  memory.        Current Meds  Medication Sig  . acetaminophen (TYLENOL) 500 MG tablet Take 500 mg by mouth every 6 (six) hours as needed (pain). Per bottle as needed for pain  . acyclovir (ZOVIRAX) 5 % ointment Apply 1 application topically every 3 (three) hours as needed (cold sores). Apply as directed for cold sores  . albuterol (PROAIR HFA) 108 (90 Base) MCG/ACT inhaler 2 puffs every 4 hours as needed only  if your can't catch your breath  . albuterol (VENTOLIN HFA) 108 (90 Base) MCG/ACT inhaler Inhale 2 puffs into the lungs every 4 (four) hours as needed for wheezing or shortness of breath.  [name] Kitchen  budesonide (PULMICORT) 0.25 MG/2ML nebulizer solution USE 1 VIAL IN NEBULIZER TWICE DAILY. DX: J45.909  . Calcium Carbonate (CALCIUM 500 PO) Take 1 tablet by mouth daily.   . chlorpheniramine (CHLOR-TRIMETON) 4 MG tablet Take 4 mg by mouth every 4 (four) hours as needed for allergies.  . famotidine (PEPCID) 20 MG tablet Take 20 mg by mouth daily as needed for heartburn or indigestion.  . fluticasone (FLONASE) 50 MCG/ACT nasal spray USE 1 SPRAY IN EACH NOSTRIL EVERY MORNING  AND AT BEDTIME AS NEEDED  .  formoterol (PERFOROMIST) 20 MCG/2ML nebulizer solution Take 2 mLs (20 mcg total) by nebulization 2 (two) times daily. DX: J45.909  . LORazepam (ATIVAN) 0.5 MG tablet Take 0.5 mg by mouth daily as needed for anxiety.  . metoprolol tartrate (LOPRESSOR) 50 MG tablet TAKE 1/2 TABLET (25 MG TOTAL) BY MOUTH 2 TIMES DAILY.  . montelukast (SINGULAIR) 10 MG tablet TAKE 1 TABLET EVERY EVENING  . Multiple Vitamins-Minerals (CENTRUM) tablet Take 1 tablet by mouth daily.    [name] Kitchen omeprazole (PRILOSEC) 20 MG capsule Take 1 capsule (20 mg total) by mouth daily.  [name] Kitchen oxymetazoline (AFRIN) 0.05 % nasal spray Place 1 spray into the nose 2 (two) times daily as needed for congestion (x5 days). congestion  . simvastatin (ZOCOR) 40 MG tablet TAKE 1 TABLET EVERY EVENING            PMH PVCs.  Syncope  - Holter ordered June 27, 2008 > nl  - EP consult June 27, 2008  OSTEOPENIA (ICD-733.90)  COLONIC POLYPS (ICD-211.3) .......................................[name] Kitchen [name] - see colonoscopy 11/07/10 (repeat in 10 yr)  DIVERTICULOSIS, MILD (ICD-562.10) ASTHMA (ICD-493.90)  - HFA 90% June 11, 2009  ALLERGIC RHINITIS, CHRONIC (ICD-477.9).......................  [name]  - steroid dep until mid oct 2009  - Allergy profile sent January 23, 2010 >> IgE 18.3  - Polypectomy Sept 2012 ......................................................  [name]/ [facility]  BENIGN PROSTATIC HYPERTROPHY, HX OF (ICD-V13.8) .[name] Kitchen... 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 [place].......................................................[name] KitchenWert  R C6/7 radiculopathy 2015 ....................................................... [name]  - CPX 10/22/2016  - Pneumovax 11/2004 and @ age 72 June 11, 2009 and Prevnar 08/14/2013  - Td 07/2005   And  08/01/2015        Past  Surgical History:  Appendectomy  Colonoscopy     Family History:  heart disease in his father onset at age 27 he was a smoker  Ca brain half brother siblings healthy but their half Mother dementia onset late 24s lived to be 72    Social History:  quit smoking 1971  rarely drink alcohol  Retired  Widower 2018          Objective:   Physical Exam  wt 198 May 17, 2008 >  > 192 07/17/11 >  02/05/2012  191> 05/10/2012  188> 08/10/2012 190> 11/14/2012 180>182 12/12/2012 > 03/14/2013 182 > 07/06/2013 185 > 08/14/2013  184 > 01/31/2014  176 >176 .03/22/2014 >  05/31/2014   181 > 06/11/14  179 > 08/20/2014   184> 02/18/2015  181 > 08/01/2015  178 > 09/16/2015 182  > 12/16/2015   185 > 03/17/2016  185> 08/07/2016   187  > 10/22/2016    183 > 01/22/2017   180> 07/29/2017   177 > 10/28/2017  169 >  06/16/2018  175        Vital signs reviewed - Note on arrival 02 sats  96% on  RA and bp 126/78          HEENT: nl dentition, and oropharynx. Nl external ear canals without cough reflex - moderate bilateral non-specific turbinate edema/ cruting    with a few late exp wheezes   Bilaterally    HEENT: nl dentition,  and oropharynx. Nl external ear canals without cough reflex - mod severe bilateral non-specific turbinate edema /crusty secretions but no viz polyps     NECK :  without JVD/Nodes/TM/ nl carotid upstrokes bilaterally   LUNGS: no acc muscle use,  Nl contour chest with min late exp wheeze  bilaterally without cough on insp or exp maneuvers   CV:  RRR  no s3 or murmur or increase in P2, and no edema   ABD:  soft and nontender with nl inspiratory excursion in the supine position. No bruits or organomegaly appreciated, bowel sounds nl  MS:  Nl gait/ ext warm without deformities, calf tenderness, cyanosis or clubbing No obvious joint restrictions   SKIN: warm and dry without lesions    NEURO:  alert, approp, nl sensorium with  no motor or cerebellar deficits apparent.

## 2018-06-16 NOTE — Patient Instructions (Addendum)
No change in medications   If nasal symptoms worsen you will need to see either Dr Meredith Leeds or Lucia Gaskins  See calendar for specific medication instructions and bring it back for each and every office visit for every healthcare provider you see.  Without it,  you may not receive the best quality medical care that we feel you deserve.  You will note that the calendar groups together  your maintenance  medications that are timed at particular times of the day.  Think of this as your checklist for what your doctor has instructed you to do until your next evaluation to see what benefit  there is  to staying on a consistent group of medications intended to keep you well.  The other group at the bottom is entirely up to you to use as you see fit  for specific symptoms that may arise between visits that require you to treat them on an as needed basis.  Think of this as your action plan or "what if" list.   Separating the top medications from the bottom group is fundamental to providing you adequate care going forward.     Please schedule a follow up visit in 6 months but call sooner if needed with CPX on return

## 2018-06-17 ENCOUNTER — Encounter: Payer: Self-pay | Admitting: Internal Medicine

## 2018-06-17 NOTE — Assessment & Plan Note (Signed)
-   s/p repeat sinus surgery polypectomy 03/28/11   Seeing two different ent's now for ears vs sinus issues > suggest he pick one and settle on him.

## 2018-06-17 NOTE — Assessment & Plan Note (Signed)
Adequate control on present rx, reviewed in detail with pt > no change in rx needed        I had an extended discussion with the patient reviewing all relevant studies completed to date and  lasting 15 to 20 minutes of a 25 minute visit    See device teaching which extended face to face time for this visit.  Each maintenance medication was reviewed in detail including emphasizing most importantly the difference between maintenance and prns and under what circumstances the prns are to be triggered using an action plan format that is not reflected in the computer generated alphabetically organized AVS which I have not found useful in most complex patients, especially with respiratory illnesses  Please see AVS for specific instructions unique to this visit that I personally wrote and verbalized to the the pt in detail and then reviewed with pt  by my nurse highlighting any  changes in therapy recommended at today's visit to their plan of care.

## 2018-06-17 NOTE — Assessment & Plan Note (Signed)
-  PFT's  09/16/2015  FEV1 1.81 (61 % ) ratio 56  p 44 % improvement from saba with DLCO  81/81c % corrects to 107  % for alv volume  Done prior to any am meds  - 06/16/2018  After extensive coaching inhaler device,  effectiveness =    90%    All goals of chronic asthma control met including optimal (though not nl) function and elimination of symptoms with minimal need for rescue therapy.  Contingencies discussed in full including contacting this office immediately if not controlling the symptoms using the rule of two's.     Reviewed difference between severe chronic asthma and copd vs acos all [place] from my perspective and though  insurance companies may try to distinguish these, there really is no difference in management needed from my perspective

## 2018-07-25 ENCOUNTER — Telehealth: Payer: Self-pay | Admitting: Internal Medicine

## 2018-07-25 MED ORDER — FORMOTEROL FUMARATE 20 MCG/2ML IN NEBU: 20.0000 ug | INHALATION_SOLUTION | Freq: Two times a day (BID) | RESPIRATORY_TRACT | 5 refills | Status: DC

## 2018-07-25 NOTE — Telephone Encounter (Signed)
Spoke with patient in the lobby, he is requesting a refill of perforomist and did not know that it could be sent electronically. Refill sent. Nothing further needed.

## 2018-08-10 ENCOUNTER — Telehealth: Payer: Self-pay | Admitting: Internal Medicine

## 2018-08-10 NOTE — Telephone Encounter (Signed)
Called patient to verify this. Unable to reach left message to call back.

## 2018-08-11 MED ORDER — METOPROLOL TARTRATE 50 MG PO TABS: ORAL_TABLET | ORAL | 1 refills | Status: DC

## 2018-08-11 MED ORDER — MONTELUKAST SODIUM 10 MG PO TABS: 10.0000 mg | ORAL_TABLET | Freq: Every evening | ORAL | 1 refills | Status: DC

## 2018-08-11 NOTE — Telephone Encounter (Signed)
rx refills sent to pharm as requested

## 2018-09-21 ENCOUNTER — Encounter (INDEPENDENT_AMBULATORY_CARE_PROVIDER_SITE_OTHER): Payer: Self-pay | Admitting: Family Medicine

## 2018-09-21 ENCOUNTER — Ambulatory Visit (INDEPENDENT_AMBULATORY_CARE_PROVIDER_SITE_OTHER): Payer: Medicare Other | Admitting: Family Medicine

## 2018-09-21 ENCOUNTER — Ambulatory Visit (INDEPENDENT_AMBULATORY_CARE_PROVIDER_SITE_OTHER): Payer: Medicare Other

## 2018-09-21 DIAGNOSIS — H2513 Age-related nuclear cataract, bilateral: Secondary | ICD-10-CM | POA: Diagnosis not present

## 2018-09-21 DIAGNOSIS — M25512 Pain in left shoulder: Secondary | ICD-10-CM

## 2018-09-21 NOTE — Progress Notes (Signed)
Office Visit Note   Patient: Antonio Carlson           Date of Birth: [birth year]           MRN: [id number] Visit Date: 09/21/2018 Requested by: [name], MD [address] [place], [place] [address] PCP: [name], MD  Subjective: Chief Complaint  Patient presents with  . Left Shoulder - Pain    Toe got caught on something while crossing a creek while hunting 09/19/2018. [place] forward hitting elbow on rocks. Elbow ok, but having pain in shoulder.    HPI: He is a 73 year old right-hand-dominant male with left shoulder pain.  2 days ago while hunting, he was crossing a creek and stepping over some rocks, he tripped and fell forward landing on his left elbow.  The elbow did not hurt but it jammed his shoulder.  He continued to hunt but by the end of the day he had very difficult time lifting his gun with his left arm.  He put some over-the-counter ointment on his shoulder, felt a little bit better yesterday and feels a lot better today but still has pain with certain movements such as reaching behind his back or reaching overhead.  He has had a history of shoulder strains over the years but never anything requiring surgery.  Denies any numbness or tingling in his arm.  The last day of hunting season is this weekend.              ROS: He has hypertension, history of osteopenia.  Other systems were negative as pertains to the chief complaint.  Objective: Vital Signs: There were no vitals taken for this visit.  Physical Exam:  Left shoulder: No swelling or bruising.  He is moderately tender over the long head biceps tendon.  No tenderness at the [facility] joint or in the subacromial space.  Speeds test causes pain, empty can test causes slight pain.  No pain with internal/external rotation against resistance.  Elbow range of motion is full and pain-free.  Imaging: X-rays left shoulder: Mild glenohumeral and moderate AC joint DJD.  On the AP view I question whether there might be a  cortical defect at the inferior aspect of the glenoid but I do not see a corresponding abnormality on the axillary view.  Alignment is anatomic.   Assessment & Plan: 1.  Clinically improving 2 days status post fall with left shoulder pain, possible long head biceps subluxation, cannot rule out nondisplaced inferior glenoid fracture. -Gentle range of motion to tolerance.  Okay to proceed with hunting this weekend.  If still having significant pain in the next couple weeks we might repeat x-rays.  If unremarkable, then possibly ultrasound.     Procedures: No procedures performed  No notes on file     PMFS History: Patient Active Problem List   Diagnosis Date Noted  . Nasal polyposis 03/17/2016  . Upper airway cough syndrome vs cough variant component  02/18/2015  . Cervical neck pain with evidence of disc disease = C8 nerve root distribution 06/11/2014  . Syncope 05/31/2014  . Mechanical low back pain 02/01/2014  . Hip pain, left 02/05/2012  . URI (upper respiratory infection) 10/01/2011  . Essential hypertension 09/15/2010  . RECTAL BLEEDING 09/15/2010  . BLOOD IN STOOL 09/11/2010  . 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 OSTEOPENIA 10/10/2007  . COLONIC POLYPS 07/08/2007  . Hyperlipidemia LDL goal <130 07/08/2007  . Allergic rhinitis 07/08/2007  . 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 DIVERTICULOSIS, MILD 07/08/2007  . NEPHROLITHIASIS 07/08/2007  . BENIGN PROSTATIC HYPERTROPHY, HX OF 07/08/2007   Past Medical History:  Diagnosis Date  . Allergy   . Asthma   . Benign prostatic hypertrophy   . Diverticulosis   . GERD (gastroesophageal reflux disease)   . Hyperlipidemia   . Hypertension   . Nephrolithiasis   . PVC's (premature ventricular contractions)   . Syncope     History reviewed. No pertinent family history.  Past Surgical History:  Procedure Laterality Date  . APPENDECTOMY    . NASAL SINUS SURGERY  Sept 2012   Social History   Occupational History  . Not on file    Tobacco Use  . Smoking status: Former Smoker    Packs/day: 2.00    Years: 14.00    Pack years: 28.00    Last attempt to quit: 07/27/1969    Years since quitting: 49.1  . Smokeless tobacco: Never Used  Substance and Sexual Activity  . Alcohol use: No  . Drug use: Not on file  . Sexual activity: Not on file

## 2018-11-07 DIAGNOSIS — R3912 Poor urinary stream: Secondary | ICD-10-CM | POA: Diagnosis not present

## 2018-11-07 DIAGNOSIS — N401 Enlarged prostate with lower urinary tract symptoms: Secondary | ICD-10-CM | POA: Diagnosis not present

## 2018-11-16 ENCOUNTER — Telehealth: Payer: Self-pay | Admitting: Internal Medicine

## 2018-11-16 MED ORDER — SIMVASTATIN 40 MG PO TABS: ORAL_TABLET | ORAL | 1 refills | Status: DC

## 2018-11-16 NOTE — Telephone Encounter (Signed)
Rx simvastatin sent to pt's preferred pharmacy. Attempted to call pt to let him know this had been done but unable to reach. Left pt a detailed message letting him know that the refill was taken care of. Nothing further needed.

## 2018-11-22 ENCOUNTER — Other Ambulatory Visit: Payer: Self-pay | Admitting: Internal Medicine

## 2018-11-22 DIAGNOSIS — J339 Nasal polyp, unspecified: Secondary | ICD-10-CM

## 2018-11-22 DIAGNOSIS — Z886 Allergy status to analgesic agent status: Principal | ICD-10-CM

## 2018-11-22 DIAGNOSIS — J45909 Unspecified asthma, uncomplicated: Principal | ICD-10-CM

## 2018-11-23 ENCOUNTER — Telehealth: Payer: Self-pay | Admitting: Internal Medicine

## 2018-11-23 DIAGNOSIS — J339 Nasal polyp, unspecified: Secondary | ICD-10-CM

## 2018-11-23 DIAGNOSIS — Z886 Allergy status to analgesic agent status: Principal | ICD-10-CM

## 2018-11-23 DIAGNOSIS — J45909 Unspecified asthma, uncomplicated: Principal | ICD-10-CM

## 2018-11-23 MED ORDER — PREDNISONE 10 MG PO TABS: ORAL_TABLET | ORAL | 11 refills | Status: DC

## 2018-11-23 NOTE — Telephone Encounter (Signed)
Returned call to patient.  States his prednisone short taper prescription has expired, having obtained the last refill for it this week for his seasonal 'sinus problems.'  Denies having any flare up with asthma stating 'no breathing problems right now.'   Symptoms are sinus congestion (yellowish) and drainage that is keeping him up at night.  No fever.  No SOB.  Describes symptoms as usual seasonal problems with sinuses and has used prednisone once a month for the past 3 months due to recurring sinus issues.   Currently using the pred taper and is 'already helping.'  Wanted to update this prescription at [facility] due to it has expired the 1 year for refills.    Patient also states he knows he has to make appointment with ENT and plans to do so as soon as offices begin seeing patients again.    Has scheduled OV with Dr. Melvyn Novas for July 2020  predniSONE (DELTASONE) 10 MG tablet 14 tablet 11 11/08/2017    Sig: Take 4 each am x 2 days,  2 each am x 2 days, 1 each am x 2 days and stop    LOV 06/16/2018 [name]  No change in medications  If nasal symptoms worsen you will need to see either Dr Meredith Leeds or Lucia Gaskins  See calendar for specific medication instructions and bring it back for each and every office visit for every healthcare provider you see.  Without it,  you may not receive the best quality medical care that we feel you deserve.  You will note that the calendar groups together  your maintenance  medications that are timed at particular times of the day.  Think of this as your checklist for what your doctor has instructed you to do until your next evaluation to see what benefit  there is  to staying on a consistent group of medications intended to keep you well.  The other group at the bottom is entirely up to you to use as you see fit  for specific symptoms that may arise between visits that require you to treat them on an as needed basis.  Think of this as your action plan or "what  if" list.   Separating the top medications from the bottom group is fundamental to providing you adequate care going forward.    Please schedule a follow up visit in 6 months but call sooner if needed with CPX on return   Route to Dr. Melvyn Novas for refill authorization.   Dr. Melvyn Novas please advise. Thank you

## 2018-11-23 NOTE — Telephone Encounter (Signed)
LMTCB

## 2018-11-23 NOTE — Telephone Encounter (Signed)
Pt returned call

## 2018-11-23 NOTE — Telephone Encounter (Signed)
Refill of pred taper Rx has been sent to pt's pharmacy per MW. Attempted to call pt to let him know this had been done but unable to reach pt. I left a detailed message on pt's machine letting him know this was taken care of for him. Nothing further needed.

## 2018-11-23 NOTE — Telephone Encounter (Signed)
Yes needs refill x  11

## 2018-12-08 ENCOUNTER — Encounter: Payer: Medicare Other | Admitting: Internal Medicine

## 2018-12-15 ENCOUNTER — Encounter: Payer: Medicare Other | Admitting: Internal Medicine

## 2018-12-15 ENCOUNTER — Ambulatory Visit (INDEPENDENT_AMBULATORY_CARE_PROVIDER_SITE_OTHER): Payer: Medicare Other | Admitting: Internal Medicine

## 2018-12-15 ENCOUNTER — Other Ambulatory Visit: Payer: Self-pay

## 2018-12-15 ENCOUNTER — Ambulatory Visit (INDEPENDENT_AMBULATORY_CARE_PROVIDER_SITE_OTHER): Payer: Medicare Other

## 2018-12-15 ENCOUNTER — Encounter: Payer: Self-pay | Admitting: Internal Medicine

## 2018-12-15 VITALS — BP 130/70 | HR 62 | Temp 98.4°F | Ht 68.0 in | Wt 182.8 lb

## 2018-12-15 DIAGNOSIS — J339 Nasal polyp, unspecified: Secondary | ICD-10-CM

## 2018-12-15 DIAGNOSIS — J302 Other seasonal allergic rhinitis: Secondary | ICD-10-CM

## 2018-12-15 DIAGNOSIS — J45909 Unspecified asthma, uncomplicated: Secondary | ICD-10-CM | POA: Diagnosis not present

## 2018-12-15 DIAGNOSIS — Z886 Allergy status to analgesic agent status: Secondary | ICD-10-CM

## 2018-12-15 DIAGNOSIS — I10 Essential (primary) hypertension: Secondary | ICD-10-CM | POA: Diagnosis not present

## 2018-12-15 DIAGNOSIS — I209 Angina pectoris, unspecified: Secondary | ICD-10-CM | POA: Insufficient documentation

## 2018-12-15 DIAGNOSIS — E785 Hyperlipidemia, unspecified: Secondary | ICD-10-CM | POA: Diagnosis not present

## 2018-12-15 LAB — LIPID PANEL
Cholesterol: 181 mg/dL (ref 0–200)
HDL: 51.2 mg/dL (ref >39.00)
LDL Cholesterol: 102 mg/dL — ABNORMAL HIGH (ref 0–99)
NonHDL: 129.31
Total CHOL/HDL Ratio: 4
Triglycerides: 138 mg/dL (ref 0.0–149.0)
VLDL: 27.6 mg/dL (ref 0.0–40.0)

## 2018-12-15 LAB — CBC WITH DIFFERENTIAL/PLATELET
Basophils Absolute: 0.1 10*3/uL (ref 0.0–0.1)
Basophils Relative: 1.2 % (ref 0.0–3.0)
Eosinophils Absolute: 0.8 10*3/uL — ABNORMAL HIGH (ref 0.0–0.7)
Eosinophils Relative: 10.3 % — ABNORMAL HIGH (ref 0.0–5.0)
HCT: 47 % (ref 39.0–52.0)
Hemoglobin: 15.8 g/dL (ref 13.0–17.0)
Lymphocytes Relative: 31.6 % (ref 12.0–46.0)
Lymphs Abs: 2.5 10*3/uL (ref 0.7–4.0)
MCHC: 33.7 g/dL (ref 30.0–36.0)
MCV: 86.4 fl (ref 78.0–100.0)
Monocytes Absolute: 0.9 10*3/uL (ref 0.1–1.0)
Monocytes Relative: 11.3 % (ref 3.0–12.0)
Neutro Abs: 3.6 10*3/uL (ref 1.4–7.7)
Neutrophils Relative %: 45.6 % (ref 43.0–77.0)
Platelets: 240 10*3/uL (ref 150.0–400.0)
RBC: 5.44 Mil/uL (ref 4.22–5.81)
RDW: 15 % (ref 11.5–15.5)
WBC: 7.9 10*3/uL (ref 4.0–10.5)

## 2018-12-15 LAB — BASIC METABOLIC PANEL
BUN: 16 mg/dL (ref 6–23)
CO2: 32 mEq/L (ref 19–32)
Calcium: 9.1 mg/dL (ref 8.4–10.5)
Chloride: 100 mEq/L (ref 96–112)
Creatinine, Ser: 0.9 mg/dL (ref 0.40–1.50)
GFR: 82.19 mL/min (ref >60.00)
Glucose, Bld: 89 mg/dL (ref 70–99)
Potassium: 4 mEq/L (ref 3.5–5.1)
Sodium: 139 mEq/L (ref 135–145)

## 2018-12-15 LAB — HEPATIC FUNCTION PANEL
ALT: 21 U/L (ref 0–53)
AST: 26 U/L (ref 0–37)
Albumin: 4.1 g/dL (ref 3.5–5.2)
Alkaline Phosphatase: 78 U/L (ref 39–117)
Bilirubin, Direct: 0.1 mg/dL (ref 0.0–0.3)
Total Bilirubin: 0.6 mg/dL (ref 0.2–1.2)
Total Protein: 6.9 g/dL (ref 6.0–8.3)

## 2018-12-15 LAB — TSH: TSH: 1.81 u[IU]/mL (ref 0.35–4.50)

## 2018-12-15 NOTE — Patient Instructions (Addendum)
Our [facility] will contact you for cardiology referral for new exercise related chest discomfort   Please remember to go to the lab and x-ray department   for your tests - we will call you with the results when they are available.     Please schedule a follow up visit in 3 months but call sooner if needed  - consider refer for Dupixent if freq flares needing prednisone

## 2018-12-15 NOTE — Progress Notes (Signed)
Subjective:    Patient ID: Antonio Carlson, male    DOB: 1946/04/15   MRN: [id number]  Brief patient profile:  73 yowm quit smoking in 1971 with history of Triad Asthma and hyperlipidemia and hbp   Brief patient profile:  06/23/2016 NP  Follow up : HTN, Hyperlipidemia/Triad Asthma  Pt returns for 3 month follow up and med review .  We reviewed all his meds and organized them into a med calendar w/ pt education . He appears to be taking correctly except for flonase , has not been using on regular basis.  Complains of 1 week of nasal congestion , drainage, stuffiness, sneezing, and ear fullness. No fever or sinus pain. Minimal cough .   rec Prednisone taper as directed.  Follow med calendar closely and bring to each visit.  Restart Flonase         12/15/2018  f/u ov/Maurine Mowbray re: triad asthma/ last prednisone  1st of May 2020/ hbp/ hyperlipidemia   Cc new chest burning with exertion new since Jan 2020   Dyspnea:  Not limited by breathing from desired activities  But slows down more than used to - avoid fast x 5 m is limit = MMRC1 = can walk nl pace, flat grade, can't hurry or go uphills or steps s sob   Cough: none  Sleeping: able to lie flat / on pillow SABA use: rarely 02: none    No obvious day to day or daytime variability or assoc excess/ purulent sputum or mucus plugs or hemoptysis or   subjective wheeze or overt sinus or hb symptoms.   Sleeping as above without nocturnal  or early am exacerbation  of respiratory  c/o's or need for noct saba. Also denies any obvious fluctuation of symptoms with weather or environmental changes or other aggravating or alleviating factors except as outlined above   No unusual exposure hx or h/o childhood pna/ asthma or knowledge of premature birth.  Current Allergies, Complete Past Medical History, Past Surgical History, Family History, and Social History were reviewed in [employer] record.  ROS  The following are not active  complaints unless bolded Hoarseness, sore throat, dysphagia, dental problems, itching, sneezing,  nasal congestion or discharge of excess mucus or purulent secretions, ear ache,   fever, chills, sweats, unintended wt loss or wt gain, classically pleuritic or exertional cp,  orthopnea pnd or arm/hand swelling  or leg swelling, presyncope, palpitations, abdominal pain, anorexia, nausea, vomiting, diarrhea  or change in bowel habits or change in bladder habits, change in stools or change in urine, dysuria, hematuria,  rash, arthralgias, visual complaints, headache, numbness, weakness or ataxia or problems with walking or coordination,  change in mood or  memory.        Current Meds  Medication Sig  . acetaminophen (TYLENOL) 500 MG tablet Take 500 mg by mouth every 6 (six) hours as needed (pain). Per bottle as needed for pain  . acyclovir (ZOVIRAX) 5 % ointment Apply 1 application topically every 3 (three) hours as needed (cold sores). Apply as directed for cold sores  . albuterol (PROAIR HFA) 108 (90 Base) MCG/ACT inhaler 2 puffs every 4 hours as needed only  if your can't catch your breath  . albuterol (VENTOLIN HFA) 108 (90 Base) MCG/ACT inhaler Inhale 2 puffs into the lungs every 4 (four) hours as needed for wheezing or shortness of breath.  . budesonide (PULMICORT) 0.25 MG/2ML nebulizer solution USE 1 VIAL IN NEBULIZER TWICE DAILY. DX: J45.909  .  Calcium Carbonate (CALCIUM 500 PO) Take 1 tablet by mouth daily.   . chlorpheniramine (CHLOR-TRIMETON) 4 MG tablet Take 4 mg by mouth every 4 (four) hours as needed for allergies.  . fluticasone (FLONASE) 50 MCG/ACT nasal spray USE 1 SPRAY IN EACH NOSTRIL EVERY MORNING  AND AT BEDTIME AS NEEDED  . formoterol (PERFOROMIST) 20 MCG/2ML nebulizer solution Take 2 mLs (20 mcg total) by nebulization 2 (two) times daily. DX: J45.909  . LORazepam (ATIVAN) 0.5 MG tablet Take 0.5 mg by mouth daily as needed for anxiety.  . metoprolol tartrate (LOPRESSOR) 50 MG tablet  TAKE 1/2 TABLET (25 MG TOTAL) BY MOUTH 2 TIMES DAILY.  . montelukast (SINGULAIR) 10 MG tablet Take 1 tablet (10 mg total) by mouth every evening.  . Multiple Vitamins-Minerals (CENTRUM) tablet Take 1 tablet by mouth daily.    [name] Kitchen omeprazole (PRILOSEC) 20 MG capsule Take 1 capsule (20 mg total) by mouth daily.  [name] Kitchen oxymetazoline (AFRIN) 0.05 % nasal spray Place 1 spray into the nose 2 (two) times daily as needed for congestion (x5 days). congestion  . simvastatin (ZOCOR) 40 MG tablet TAKE 1 TABLET EVERY EVENING                 PMH PVCs.  Syncope  - Holter ordered June 27, 2008 > nl  - EP consult June 27, 2008  OSTEOPENIA (ICD-733.90)  COLONIC POLYPS (ICD-211.3) .......................................[name] Kitchen Antonio Carlson - see colonoscopy 11/07/10 (repeat in 10 yr)  DIVERTICULOSIS, MILD (ICD-562.10) ASTHMA (ICD-493.90)  - HFA 90% June 11, 2009  ALLERGIC RHINITIS, CHRONIC (ICD-477.9)  - steroid dep until mid oct 2009  - Allergy profile sent January 23, 2010 >> IgE 18.3  - Polypectomy Sept 2012 ......................................................  Antonio Carlson  BENIGN PROSTATIC HYPERTROPHY, HX OF (ICD-V13.8) .[name] Kitchen... Antonio Carlson NEPHROLITHIASIS (ICD-592.0)  HYPERLIPIDEMIA (ICD-272.4) target < 130 male, pos fm hx, h/l smoking  R Shoulder pain..................................................................... Antonio Carlson.......................................................[name] KitchenWert  R C6/7 radiculopathy 2015 ....................................................... Antonio Carlson  - CPX 12/15/2018  - Pneumovax 11/2004 and @ age 84 June 11, 2009 and Prevnar 13 08/14/2013  - Td 07/2005   And  08/01/2015        Past Surgical History:  Appendectomy  Colonoscopy     Family History:  heart disease in his father onset at age 87 he was a smoker  Ca brain half brother siblings healthy but they're  82 siblings  Mother dementia onset late 3s lived to be 25    Social  History:  quit smoking 1971  rarely drink alcohol  Retired  [employer] 2018          Objective:   Physical Exam  wt 198 May 17, 2008 >  > 192 07/17/11 >  02/05/2012  191> 05/10/2012  188> 08/10/2012 190> 11/14/2012 180>182 12/12/2012 > 03/14/2013 182 > 07/06/2013 185 > 08/14/2013  184 > 01/31/2014  176 >176 .03/22/2014 >  05/31/2014   181 > 06/11/14  179 > 08/20/2014   184> 02/18/2015  181 > 08/01/2015  178 > 09/16/2015 182  > 12/16/2015   185 > 03/17/2016  185> 08/07/2016   187  > 10/22/2016    183 > 01/22/2017   180> 07/29/2017   177 > 10/28/2017  169 >  06/16/2018  175 > 12/15/2018  182     Vital signs reviewed - Note on arrival 02 sats  97% on RA  And bp 130/70     HEENT: nl dentition,   and oropharynx. Nl external ear canals without cough reflex -  mild/mod bilateral non-specific turbinate edema     NECK :  without JVD/Nodes/TM/ nl carotid upstrokes bilaterally   LUNGS: no acc muscle use,  Nl contour chest trace end exp wheeze bilaterally without cough on insp or exp maneuvers   CV:  RRR  no s3 or murmur or increase in P2, and trace  edema  R > L with decreased L Dorsalis pedis o/w good periperal pulses bilaterally   ABD:  soft and nontender with nl inspiratory excursion in the supine position. No bruits or organomegaly appreciated, bowel sounds nl  MS:  Nl gait/ ext warm without deformities, calf tenderness, cyanosis or clubbing No obvious joint restrictions   SKIN: warm and dry without lesions    NEURO:  alert, approp, nl sensorium with  no motor or cerebellar deficits apparent.     ekg 12/15/2018  Nsr/ wnl   Labs ordered/ reviewed:      Chemistry      Component Value Date/Time   NA 139 12/15/2018 1140   K 4.0 12/15/2018 1140   CL 100 12/15/2018 1140   CO2 32 12/15/2018 1140   BUN 16 12/15/2018 1140   CREATININE 0.90 12/15/2018 1140      Component Value Date/Time   CALCIUM 9.1 12/15/2018 1140   ALKPHOS 78 12/15/2018 1140   AST 26 12/15/2018 1140   ALT 21 12/15/2018 1140    BILITOT 0.6 12/15/2018 1140        Lab Results  Component Value Date   WBC 7.9 12/15/2018   HGB 15.8 12/15/2018   HCT 47.0 12/15/2018   MCV 86.4 12/15/2018   PLT 240.0 12/15/2018       Lab Results  Component Value Date   TSH 1.81 12/15/2018         Labs ordered 12/15/2018  Lipid profile

## 2018-12-19 ENCOUNTER — Encounter: Payer: Self-pay | Admitting: Internal Medicine

## 2018-12-19 NOTE — Assessment & Plan Note (Signed)
Onset jan 2020   Classic reproducible but stable exertional cp > referred to card, rec all sooner if crescendo's

## 2018-12-19 NOTE — Assessment & Plan Note (Signed)
-   s/p repeat sinus surgery 03/28/11   Adequate control on present rx, reviewed in detail with pt > no change in rx needed

## 2018-12-19 NOTE — Assessment & Plan Note (Signed)
-   PFT's  09/16/2015  FEV1 1.81 (61 % ) ratio 56  p 44 % improvement from saba with DLCO  81/81c % corrects to 107  % for alv volume  Done prior to any am meds  - 06/16/2018  After extensive coaching inhaler device,  effectiveness =    90%   At present, All goals of chronic asthma control met including optimal (though clearly not nl)  function and elimination of symptoms with minimal need for rescue therapy.  Contingencies discussed in full including contacting this office immediately if not controlling the symptoms using the rule of two's.     May be a good candidate for Dupixent  if has recurrent polyps/ increase in baseline prednisone need

## 2018-12-19 NOTE — Assessment & Plan Note (Signed)
Target LDL  < 130 male, pos fm hx, h/o smoking   - 08/11/2011 changed to zocor 40 mg per day    Lab Results  Component Value Date   CHOL 181 12/15/2018   HDL 51.20 12/15/2018   LDLCALC 102 (H) 12/15/2018   LDLDIRECT 122.0 08/20/2014   TRIG 138.0 12/15/2018   CHOLHDL 4 12/15/2018   hdl looks good - if proves to have cad then needs ldl < 70

## 2018-12-19 NOTE — Assessment & Plan Note (Signed)
Lab Results  Component Value Date   CREATININE 0.90 12/15/2018   CREATININE 0.96 07/29/2017   CREATININE 0.99 10/22/2016     Adequate control on present rx, reviewed in detail with pt > no change in rx needed

## 2019-01-03 DIAGNOSIS — H2512 Age-related nuclear cataract, left eye: Secondary | ICD-10-CM | POA: Diagnosis not present

## 2019-01-03 DIAGNOSIS — H25812 Combined forms of age-related cataract, left eye: Secondary | ICD-10-CM | POA: Diagnosis not present

## 2019-01-23 ENCOUNTER — Other Ambulatory Visit: Payer: Self-pay | Admitting: Internal Medicine

## 2019-01-23 ENCOUNTER — Telehealth: Payer: Self-pay | Admitting: Internal Medicine

## 2019-01-23 MED ORDER — PERFOROMIST 20 MCG/2ML IN NEBU: 20.0000 ug | INHALATION_SOLUTION | Freq: Two times a day (BID) | RESPIRATORY_TRACT | 5 refills | Status: DC

## 2019-01-23 NOTE — Telephone Encounter (Signed)
ATC pt, no answer. Left message for pt to call back.  RX sent into the  [facility] [address]

## 2019-01-24 NOTE — Telephone Encounter (Signed)
Patient notified by phone that prescription was sent to [facility].  Acknowledged understanding and nothing further needed.

## 2019-02-09 ENCOUNTER — Encounter: Payer: Medicare Other | Admitting: Internal Medicine

## 2019-02-14 ENCOUNTER — Other Ambulatory Visit: Payer: Self-pay | Admitting: Internal Medicine

## 2019-03-07 DIAGNOSIS — H2511 Age-related nuclear cataract, right eye: Secondary | ICD-10-CM | POA: Diagnosis not present

## 2019-03-07 DIAGNOSIS — H25811 Combined forms of age-related cataract, right eye: Secondary | ICD-10-CM | POA: Diagnosis not present

## 2019-03-15 ENCOUNTER — Other Ambulatory Visit: Payer: Self-pay

## 2019-03-15 ENCOUNTER — Ambulatory Visit (INDEPENDENT_AMBULATORY_CARE_PROVIDER_SITE_OTHER): Payer: Medicare Other | Admitting: Internal Medicine

## 2019-03-15 ENCOUNTER — Encounter: Payer: Self-pay | Admitting: Internal Medicine

## 2019-03-15 VITALS — BP 126/70 | HR 61 | Temp 97.7°F | Ht 68.0 in | Wt 178.8 lb

## 2019-03-15 DIAGNOSIS — I209 Angina pectoris, unspecified: Secondary | ICD-10-CM | POA: Diagnosis not present

## 2019-03-15 DIAGNOSIS — E782 Mixed hyperlipidemia: Secondary | ICD-10-CM | POA: Diagnosis not present

## 2019-03-15 DIAGNOSIS — M79604 Pain in right leg: Secondary | ICD-10-CM

## 2019-03-15 DIAGNOSIS — M79605 Pain in left leg: Secondary | ICD-10-CM | POA: Diagnosis not present

## 2019-03-15 DIAGNOSIS — I10 Essential (primary) hypertension: Secondary | ICD-10-CM | POA: Diagnosis not present

## 2019-03-15 DIAGNOSIS — R072 Precordial pain: Secondary | ICD-10-CM | POA: Diagnosis not present

## 2019-03-15 DIAGNOSIS — I8393 Asymptomatic varicose veins of bilateral lower extremities: Secondary | ICD-10-CM

## 2019-03-15 DIAGNOSIS — M25559 Pain in unspecified hip: Secondary | ICD-10-CM | POA: Diagnosis not present

## 2019-03-15 NOTE — Progress Notes (Signed)
OFFICE CONSULT NOTE  Chief Complaint:  Chest pain  Primary Care Physician: [name], MD  HPI:  Antonio Carlson is a 73 y.o. male who is being seen today for the evaluation of chest pain at the request of [name], MD. This is a pleasant 73 year old male patient of Dr. Melvyn Novas he is been followed for asthma for some time.  He has a history of hypertension, dyslipidemia and PVCs in the past.  Family history significant for his father who had an MI in his 59s.  He was a previously a smoker but quit in the 1970s.  Since about January has had some exertional burning in his chest.  Is worse with exercise and improves at rest.  It does not radiate.  His symptoms have been fairly stable.  He says it does not always happen with exertion.  It was felt that some of the symptoms were out of proportion for his lung disease.  His dyslipidemia is well managed with total cholesterol 181, triglycerides 138, HDL 51 and LDL 102 on 40 mg simvastatin.  He is not on aspirin due to anaphylactic reaction.  He also has hypertension and is on a beta-blocker.  Heart rate is in the low 60s.  Blood pressures well controlled today.  EKG was performed the office shows sinus rhythm with sinus arrhythmia at 61.  Mr. Borkenhagen is an avid hunter but also notes that when he walks a certain distance he gets pain in both of the buttocks and upper thighs and has to stop.  Once he stops the symptoms get better and then he gets up and goes on again.  He has had x-rays of his hips which showed some mild osteoarthritis but no significant findings to explain his pain.  PMHx:  Past Medical History:  Diagnosis Date   Allergy    Asthma    Benign prostatic hypertrophy    Diverticulosis    GERD (gastroesophageal reflux disease)    Hyperlipidemia    Hypertension    Nephrolithiasis    PVC's (premature ventricular contractions)    Syncope     Past Surgical History:  Procedure Laterality Date   APPENDECTOMY     NASAL  SINUS SURGERY  Sept 2012    FAMHx:  Family History  Problem Relation Age of Onset   Heart attack Father     SOCHx:   reports that he quit smoking about 49 years ago. He has a 28.00 pack-year smoking history. He has never used smokeless tobacco. He reports that he does not drink alcohol. No history on file for drug.  ALLERGIES:  Allergies  Allergen Reactions   Aspirin     REACTION: sob   Amoxicillin     REACTION: gi upset   Amoxicillin-Pot Clavulanate     REACTION: gi upset   Sulfonamide Derivatives Other (See Comments)    Doesn't remember    ROS: Pertinent items noted in HPI and remainder of comprehensive ROS otherwise negative.  HOME MEDS: Current Outpatient Medications on File Prior to Visit  Medication Sig Dispense Refill   acetaminophen (TYLENOL) 500 MG tablet Take 500 mg by mouth every 6 (six) hours as needed (pain). Per bottle as needed for pain     acyclovir (ZOVIRAX) 5 % ointment Apply 1 application topically every 3 (three) hours as needed (cold sores). Apply as directed for cold sores     albuterol (PROAIR HFA) 108 (90 Base) MCG/ACT inhaler 2 puffs every 4 hours as needed only  if your can't catch your breath 1 Inhaler 11   albuterol (VENTOLIN HFA) 108 (90 Base) MCG/ACT inhaler Inhale 2 puffs into the lungs every 4 (four) hours as needed for wheezing or shortness of breath.     budesonide (PULMICORT) 0.25 MG/2ML nebulizer solution USE 1 VIAL IN NEBULIZER TWICE DAILY. DX: J45.909 120 mL 5   Calcium Carbonate (CALCIUM 500 PO) Take 1 tablet by mouth daily.      chlorpheniramine (CHLOR-TRIMETON) 4 MG tablet Take 4 mg by mouth every 4 (four) hours as needed for allergies.     fluticasone (FLONASE) 50 MCG/ACT nasal spray USE 1 SPRAY IN EACH NOSTRIL EVERY MORNING  AND AT BEDTIME AS NEEDED 48 g 1   formoterol (PERFOROMIST) 20 MCG/2ML nebulizer solution Take 2 mLs (20 mcg total) by nebulization 2 (two) times daily. DX: J45.909 120 mL 5   LORazepam (ATIVAN) 0.5  MG tablet Take 0.5 mg by mouth daily as needed for anxiety.     metoprolol tartrate (LOPRESSOR) 50 MG tablet Take 1/2 (one-half) tablet by mouth twice daily 90 tablet 1   montelukast (SINGULAIR) 10 MG tablet TAKE 1 TABLET BY MOUTH ONCE DAILY IN THE EVENING 90 tablet 1   Multiple Vitamins-Minerals (CENTRUM) tablet Take 1 tablet by mouth daily.       omeprazole (PRILOSEC) 20 MG capsule Take 1 capsule (20 mg total) by mouth daily. 90 capsule 3   oxymetazoline (AFRIN) 0.05 % nasal spray Place 1 spray into the nose 2 (two) times daily as needed for congestion (x5 days). congestion     simvastatin (ZOCOR) 40 MG tablet TAKE 1 TABLET EVERY EVENING 90 tablet 1   [DISCONTINUED] rosuvastatin (CRESTOR) 20 MG tablet Take 1 tablet (20 mg total) by mouth at bedtime. 30 tablet 1   No current facility-administered medications on file prior to visit.     LABS/IMAGING: No results found for this or any previous visit (from the past 48 hour(s)). No results found.  LIPID PANEL:    Component Value Date/Time   CHOL 181 12/15/2018 1140   TRIG 138.0 12/15/2018 1140   HDL 51.20 12/15/2018 1140   CHOLHDL 4 12/15/2018 1140   VLDL 27.6 12/15/2018 1140   LDLCALC 102 (H) 12/15/2018 1140   LDLDIRECT 122.0 08/20/2014 0939    WEIGHTS: Wt Readings from Last 3 Encounters:  03/15/19 178 lb 12.8 oz (81.1 kg)  12/15/18 182 lb 12.8 oz (82.9 kg)  06/16/18 175 lb (79.4 kg)    VITALS: BP 126/70    Pulse 61    Temp 97.7 F (36.5 C) (Temporal)    Ht 5\' 8"  (1.727 m)    Wt 178 lb 12.8 oz (81.1 kg)    SpO2 96%    BMI 27.19 kg/m   EXAM: General appearance: alert and no distress Neck: no carotid bruit, no JVD and thyroid not enlarged, symmetric, no tenderness/mass/nodules Lungs: diminished breath sounds bilaterally and wheezes bilaterally Heart: regular rate and rhythm, S1, S2 normal, no murmur, click, rub or gallop Abdomen: soft, non-tender; bowel sounds normal; no masses,  no organomegaly Extremities:  extremities normal, atraumatic, no cyanosis or edema and varicose veins noted Pulses: 1+ pulses DP/PT in bilateral LE Skin: Skin color, texture, turgor normal. No rashes or lesions Neurologic: Grossly normal Psych: Pleasant  EKG: Normal sinus rhythm with sinus arrhythmia at 61- personally reviewed  ASSESSMENT: 1. Exertional chest pain 2. Asthma 3. Hypertension 4. Dyslipidemia 5. Bilateral hip pain 6. Asymptomatic bilateral varicose veins  PLAN: 1.   Mr. Charles  has description of exertional chest pain and shortness of breath which is worse since his January.  Cardiac risk factors include a history of smoking in the past, hypertension and dyslipidemia as well as a family history of early onset coronary disease in his father at age 73.  I am concerned that this is angina and would recommend further evaluation.  Given his low heart rate he is a good candidate for coronary artery CT scan.  Traditional stress testing or chemical stress testing could be complicated with using adenosine compounds given his reactive airway disease.  His blood pressures well controlled cholesterol is close to 100 for LDL of his target is likely less than 70, certainly if we see underlying atherosclerosis.  Finally, he is describing bilateral hip pain which seems to be worse with exertion and improved at rest.  He has decreased pulses and this may be Leriche syndrome.  Would recommend lower extremity arterial Dopplers.  We will plan follow-up via virtual visit and further testing as indicated.  Thanks again for the kind referral.  Pixie Casino, MD, FACC, [place] Director of the [facility]  [facility] Diplomate of the American Board of Clinical Lipidology Attending Cardiologist  Direct Dial: [phone]   Fax: [phone]  Website:  [email or web address] [email or web address] Bellanger 03/15/2019, 10:25 AM

## 2019-03-15 NOTE — Patient Instructions (Addendum)
Medication Instructions:  Your physician recommends that you continue on your current medications as directed. Please refer to the Current Medication list given to you today.  If you need a refill on your cardiac medications before your next appointment, please call your pharmacy.   Testing/Procedures: Dr. Debara Pickett has ordered a LOWER EXTREMITY ARTERIAL DOPPLER. This will look at blood flow in your legs. This is done at Dr. Lysbeth Penner office  Dr. Debara Pickett has ordered a CORONARY CT STUDY. This will look at your coronary arteries. This test is done at [facility]. Instructions included.  Follow-Up: At [facility], you and your health needs are our priority.  As part of our continuing mission to provide you with exceptional heart care, we have created designated Provider Care Teams.  These Care Teams include your primary Cardiologist (physician) and Advanced Practice Providers (APPs -  Physician Assistants and Nurse Practitioners) who all work together to provide you with the care you need, when you need it. . You will need a follow up appointment after your testing is completed (virtual). Please schedule with Dr. Debara Pickett.  Any Other Special Instructions Will Be Listed Below (If Applicable).  Your cardiac CT will be scheduled at one of the below locations:   [facility] [address] [place], [place] [address] [phone]  Please arrive at the [facility] main entrance of [facility] 30-45 minutes prior to test start time. Proceed to the [facility] Radiology Department (first floor) to check-in and test prep.  Please follow these instructions carefully (unless otherwise directed):  Hold all erectile dysfunction medications at least 48 hours prior to test.  On the Night Before the Test: . Be sure to Drink plenty of water. . Do not consume any caffeinated/decaffeinated beverages or chocolate 12 hours prior to your test. . Do not take any antihistamines 12 hours prior to  your test.   On the Day of the Test: . Drink plenty of water. Do not drink any water within one hour of the test. . Do not eat any food 4 hours prior to the test. . You may take your regular medications prior to the test.  . Take metoprolol (Lopressor) two hours prior to test.  After the Test: . Drink plenty of water. . After receiving IV contrast, you may experience a mild flushed feeling. This is normal. . On occasion, you may experience a mild rash up to 24 hours after the test. This is not dangerous. If this occurs, you can take Benadryl 25 mg and increase your fluid intake. . If you experience trouble breathing, this can be serious. If it is severe call 911 IMMEDIATELY. If it is mild, please call our office. . If you take any of these medications: Glipizide/Metformin, Avandament, Glucavance, please do not take 48 hours after completing test.    Please contact the cardiac imaging nurse navigator should you have any questions/concerns Marchia Bond, RN Navigator Cardiac [facility] [phone] Office  [phone] Cell

## 2019-03-16 ENCOUNTER — Other Ambulatory Visit (HOSPITAL_COMMUNITY): Payer: Self-pay | Admitting: Internal Medicine

## 2019-03-16 ENCOUNTER — Ambulatory Visit (INDEPENDENT_AMBULATORY_CARE_PROVIDER_SITE_OTHER): Payer: Medicare Other | Admitting: Internal Medicine

## 2019-03-16 ENCOUNTER — Encounter: Payer: Self-pay | Admitting: Internal Medicine

## 2019-03-16 VITALS — BP 122/70 | HR 61 | Temp 97.5°F | Ht 68.0 in | Wt 179.4 lb

## 2019-03-16 DIAGNOSIS — I739 Peripheral vascular disease, unspecified: Secondary | ICD-10-CM

## 2019-03-16 DIAGNOSIS — Z886 Allergy status to analgesic agent status: Secondary | ICD-10-CM | POA: Diagnosis not present

## 2019-03-16 DIAGNOSIS — J339 Nasal polyp, unspecified: Secondary | ICD-10-CM

## 2019-03-16 DIAGNOSIS — R058 Other specified cough: Secondary | ICD-10-CM

## 2019-03-16 DIAGNOSIS — R05 Cough: Secondary | ICD-10-CM

## 2019-03-16 DIAGNOSIS — J45909 Unspecified asthma, uncomplicated: Secondary | ICD-10-CM

## 2019-03-16 DIAGNOSIS — I10 Essential (primary) hypertension: Secondary | ICD-10-CM | POA: Diagnosis not present

## 2019-03-16 DIAGNOSIS — I209 Angina pectoris, unspecified: Secondary | ICD-10-CM

## 2019-03-16 NOTE — Progress Notes (Signed)
Subjective:    Patient ID: Antonio Carlson, male    DOB: March 11, 1946   MRN: [id number]  Brief patient profile:  73 yowm quit smoking in 1971 with history of Triad Asthma and hyperlipidemia and hbp   Brief patient profile:  06/23/2016 NP  Follow up : HTN, Hyperlipidemia/Triad Asthma  Pt returns for 3 month follow up and med review .  We reviewed all his meds and organized them into a med calendar w/ pt education . He appears to be taking correctly except for flonase , has not been using on regular basis.  Complains of 1 week of nasal congestion , drainage, stuffiness, sneezing, and ear fullness. No fever or sinus pain. Minimal cough .   rec Prednisone taper as directed.  Follow med calendar closely and bring to each visit.  Restart Flonase         12/15/2018  f/u ov/Antonio Carlson re: triad asthma/ last prednisone  1st of May 2020/ hbp/ hyperlipidemia   Cc new chest burning with exertion new since Jan 2020   Dyspnea:  Not limited by breathing from desired activities  But slows down more than used to - avoid fast x 5 m is limit = MMRC1 = can walk nl pace, flat grade, can't hurry or go uphills or steps s sob   Cough: none  Sleeping: able to lie flat / on pillow SABA use: rarely rec [facility] will contact you for cardiology referral for new exercise related chest discomfort  Please remember to go to the lab and x-ray department   for your tests - we will call you with the results when they are available.   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 Follow-up    Patient reports that his breathing and cough are doing well at this time.  Dyspnea: no change with  occ cp with exertion > cards eval in progress , no change pattern  Cough: none now  Sleeping: able to lie flat/ one pillow SABA use:  none 02: none    No obvious day to day or daytime variability or assoc excess/ purulent sputum or mucus plugs or hemoptysis or   chest tightness, subjective wheeze or overt sinus or hb symptoms.   Sleeping  without nocturnal  or early am exacerbation  of respiratory  c/o's or need for noct saba. Also denies any obvious fluctuation of symptoms with weather or environmental changes or other aggravating or alleviating factors except as outlined above   No unusual exposure hx or h/o childhood pna/ asthma or knowledge of premature birth.  Current Allergies, Complete Past Medical History, Past Surgical History, Family History, and Social History were reviewed in [employer] record.  ROS  The following are not active complaints unless bolded Hoarseness, sore throat, dysphagia, dental problems, itching, sneezing,  nasal congestion or discharge of excess mucus or purulent secretions, ear ache,   fever, chills, sweats, unintended wt loss or wt gain, classically pleuritic   cp,  orthopnea pnd or arm/hand swelling  or leg swelling, presyncope, palpitations, abdominal pain, anorexia, nausea, vomiting, diarrhea  or change in bowel habits or change in bladder habits, change in stools or change in urine, dysuria, hematuria,  rash, arthralgias, visual complaints, headache, numbness, weakness or  ataxia or problems with walking or coordination,  change in mood or  memory.        Current Meds  Medication Sig  . acetaminophen (TYLENOL) 500 MG tablet Take 500 mg by mouth every 6 (six) hours as needed (pain). Per bottle as needed for pain  . acyclovir (ZOVIRAX) 5 % ointment Apply 1 application topically every 3 (three) hours as needed (cold sores). Apply as directed for cold sores  . albuterol (PROAIR HFA) 108 (90 Base) MCG/ACT inhaler 2 puffs every 4 hours as needed only  if your can't catch your breath  . albuterol (VENTOLIN HFA) 108 (90 Base) MCG/ACT inhaler Inhale 2 puffs into the lungs  every 4 (four) hours as needed for wheezing or shortness of breath.  . budesonide (PULMICORT) 0.25 MG/2ML nebulizer solution USE 1 VIAL IN NEBULIZER TWICE DAILY. DX: J45.909  . Calcium Carbonate (CALCIUM 500 PO) Take 1 tablet by mouth daily.   . chlorpheniramine (CHLOR-TRIMETON) 4 MG tablet Take 4 mg by mouth every 4 (four) hours as needed for allergies.  . fluticasone (FLONASE) 50 MCG/ACT nasal spray USE 1 SPRAY IN EACH NOSTRIL EVERY MORNING  AND AT BEDTIME AS NEEDED  . formoterol (PERFOROMIST) 20 MCG/2ML nebulizer solution Take 2 mLs (20 mcg total) by nebulization 2 (two) times daily. DX: J45.909  . LORazepam (ATIVAN) 0.5 MG tablet Take 0.5 mg by mouth daily as needed for anxiety.  . metoprolol tartrate (LOPRESSOR) 50 MG tablet Take 1/2 (one-half) tablet by mouth twice daily  . montelukast (SINGULAIR) 10 MG tablet TAKE 1 TABLET BY MOUTH ONCE DAILY IN THE EVENING  . Multiple Vitamins-Minerals (CENTRUM) tablet Take 1 tablet by mouth daily.    [name] Kitchen omeprazole (PRILOSEC) 20 MG capsule Take 1 capsule (20 mg total) by mouth daily.  [name] Kitchen oxymetazoline (AFRIN) 0.05 % nasal spray Place 1 spray into the nose 2 (two) times daily as needed for congestion (x5 days). congestion  . simvastatin (ZOCOR) 40 MG tablet TAKE 1 TABLET EVERY EVENING                      PMH PVCs.  Syncope  - Holter ordered June 27, 2008 > nl  - EP consult June 27, 2008  OSTEOPENIA (ICD-733.90)  COLONIC POLYPS (ICD-211.3) .......................................[name] Kitchen Antonio Carlson - see colonoscopy 11/07/10 (repeat in 10 yr)  DIVERTICULOSIS, MILD (ICD-562.10) ASTHMA (ICD-493.90)  - HFA 90% June 11, 2009  ALLERGIC RHINITIS, CHRONIC (ICD-477.9)  - steroid dep until mid oct 2009  - Allergy profile sent January 23, 2010 >> IgE 18.3  - Polypectomy Sept 2012 ......................................................  Antonio Carlson/ Antonio Carlson  BENIGN PROSTATIC HYPERTROPHY, HX OF (ICD-V13.8) .[name] Kitchen... Antonio Carlson NEPHROLITHIASIS (ICD-592.0)   HYPERLIPIDEMIA (ICD-272.4) target < 130 male, pos fm hx, h/l smoking  R Shoulder pain..................................................................... [place].......................................................[name] KitchenWert  R C6/7 radiculopathy 2015 ....................................................... [name]  - CPX 12/15/2018  - Pneumovax 11/2004 and @ age 89 June 11, 2009 and Prevnar 13 08/14/2013  - Td 07/2005   And  08/01/2015        Past Surgical History:  Appendectomy  Colonoscopy     Family History:  heart disease in his father onset at age 58 he was a smoker  Ca brain half brother siblings healthy but they're  56 siblings  Mother dementia onset late 104s lived to be 12    Social History:  quit smoking 1971  rarely drink alcohol  Retired  Widower 2018          Objective:  Physical Exam  wt 198 May 17, 2008 >  > 192 07/17/11 >  02/05/2012  191> 05/10/2012  188> 08/10/2012 190> 11/14/2012 180>182 12/12/2012 > 03/14/2013 182 > 07/06/2013 185 > 08/14/2013  184 > 01/31/2014  176 >176 .03/22/2014 >  05/31/2014   181 > 06/11/14  179 > 08/20/2014   184> 02/18/2015  181 > 08/01/2015  178 > 09/16/2015 182  > 12/16/2015   185 > 03/17/2016  185> 08/07/2016   187  > 10/22/2016    183 > 01/22/2017   180> 07/29/2017   177 > 10/28/2017  169 >  06/16/2018  175 > 12/15/2018  182 > 03/16/2019   179      Vital signs reviewed - Note on arrival 02 sats  96% on RA   amb pleasant wm nad       HEENT: nl dentition, turbinates bilaterally, and oropharynx. Nl external ear canals without cough reflex   NECK :  without JVD/Nodes/TM/ nl carotid upstrokes bilaterally   LUNGS: no acc muscle use,  Nl contour chest which is clear to A and P bilaterally without cough on insp or exp maneuvers   CV:  RRR  no s3 or murmur or increase in P2, and no edema   ABD:  soft and nontender with nl inspiratory excursion in the supine position. No bruits or organomegaly appreciated, bowel  sounds nl  MS:  Nl gait/ ext warm without deformities, calf tenderness, cyanosis or clubbing No obvious joint restrictions   SKIN: warm and dry without lesions    NEURO:  alert, approp, nl sensorium with  no motor or cerebellar deficits apparent.

## 2019-03-16 NOTE — Patient Instructions (Addendum)
Congratulations on your upcoming marriage !   No change in your medications   Please schedule a follow up visit in 6 months but call sooner if needed - can refer you to Dr Neldon Mc to consider Dupixent

## 2019-03-17 ENCOUNTER — Ambulatory Visit: Payer: Medicare Other | Admitting: Internal Medicine

## 2019-03-17 ENCOUNTER — Encounter: Payer: Self-pay | Admitting: Internal Medicine

## 2019-03-17 NOTE — Assessment & Plan Note (Signed)
09/16/15 placed on prn pred for nasal obst symptoms  Referred back to ENT [facility]  03/17/2016 >>>   He has triad asthma which is under good control but still requiring intermittent prednisone for nasal flares and would be a good candidate for dupixent but since this is for rhintis more than his asthma I would prefer it be arranged by ent or Dr Neldon Mc should he agree to consider it  Discussed in detail all the  indications, usual  risks and alternatives  relative to the benefits with patient who wants to proceed with conservative f/u for now.    I had an extended discussion with the patient reviewing all relevant studies completed to date and  lasting 15 to 20 minutes of a 25 minute visit    Each maintenance medication was reviewed in detail including most importantly the difference between maintenance and prns and under what circumstances the prns are to be triggered using an action plan format that is not reflected in the computer generated alphabetically organized AVS but trather by a customized med calendar that reflects the AVS meds with confirmed 100% correlation.   In addition, Please see AVS for unique instructions that I personally wrote and verbalized to the the pt in detail and then reviewed with pt  by my nurse highlighting any  changes in therapy recommended at today's visit to their plan of care.

## 2019-03-17 NOTE — Assessment & Plan Note (Signed)
Quit smoking 1971  - PFT's  09/16/2015  FEV1 1.81 (61 % ) ratio 56  p 44 % improvement from saba with DLCO  81/81c % corrects to 107  % for alv volume  Done prior to any am meds  - 06/16/2018  After extensive coaching inhaler device,  effectiveness =    90%   All goals of chronic asthma control met including optimal function and elimination of symptoms with minimal need for rescue therapy.  Contingencies discussed in full including contacting this office immediately if not controlling the symptoms using the rule of two's.    

## 2019-03-17 NOTE — Assessment & Plan Note (Signed)
Lab Results  Component Value Date   CREATININE 0.90 12/15/2018   CREATININE 0.96 07/29/2017   CREATININE 0.99 10/22/2016     Adequate control on present rx, reviewed in detail with pt > no change in rx needed   

## 2019-03-20 ENCOUNTER — Other Ambulatory Visit: Payer: Self-pay

## 2019-03-20 ENCOUNTER — Ambulatory Visit (HOSPITAL_COMMUNITY)
Admission: RE | Admit: 2019-03-20 | Discharge: 2019-03-20 | Disposition: A | Discharge status: Home / Self Care | Payer: Medicare Other | Source: Ambulatory Visit | Attending: Cardiology | Admitting: Cardiology

## 2019-03-20 DIAGNOSIS — I739 Peripheral vascular disease, unspecified: Secondary | ICD-10-CM

## 2019-03-20 DIAGNOSIS — M25559 Pain in unspecified hip: Secondary | ICD-10-CM | POA: Diagnosis not present

## 2019-03-20 DIAGNOSIS — M79605 Pain in left leg: Secondary | ICD-10-CM | POA: Insufficient documentation

## 2019-03-20 DIAGNOSIS — M79604 Pain in right leg: Secondary | ICD-10-CM | POA: Diagnosis not present

## 2019-03-27 ENCOUNTER — Ambulatory Visit (HOSPITAL_COMMUNITY)
Admission: EM | Admit: 2019-03-27 | Discharge: 2019-03-27 | Disposition: A | Discharge status: Home / Self Care | Payer: Medicare Other | Attending: Family Medicine | Admitting: Family Medicine

## 2019-03-27 ENCOUNTER — Encounter (HOSPITAL_COMMUNITY): Payer: Self-pay

## 2019-03-27 ENCOUNTER — Other Ambulatory Visit: Payer: Self-pay

## 2019-03-27 DIAGNOSIS — R21 Rash and other nonspecific skin eruption: Secondary | ICD-10-CM

## 2019-03-27 MED ORDER — PREDNISONE 50 MG PO TABS: 50.0000 mg | ORAL_TABLET | Freq: Every day | ORAL | 0 refills | Status: AC

## 2019-03-27 MED ORDER — CETIRIZINE HCL 10 MG PO CAPS: 10.0000 mg | ORAL_CAPSULE | Freq: Every day | ORAL | 0 refills | Status: DC

## 2019-03-27 MED ORDER — PERMETHRIN 5 % EX CREA: TOPICAL_CREAM | CUTANEOUS | 0 refills | Status: DC

## 2019-03-27 NOTE — ED Triage Notes (Signed)
Pt cc insect bite x 3 days. Pt states it's all over his back and buttock.

## 2019-03-27 NOTE — ED Provider Notes (Signed)
[place]    CSN: [id number] Arrival date & time: 03/27/19  [id number]      History   Chief Complaint Chief Complaint  Patient presents with  . Insect Bite    HPI Antonio Carlson is a 73 y.o. male history of hypertension, hyperlipidemia, GERD, BPH, presenting today for evaluation of possible bug bites.  Patient states that over the past 3 to 4 days he has developed bug bites to his back, buttocks and legs.  Lesions have been associated with itching.  Itching worse at nighttime.  He believes this is from a small bug-he brings in a medicine container with a small black/brown speck.  Denies close contacts with similar symptoms.  Denies any recent tick bites.  Denies fevers.  Denies headache, nausea or vomiting.  Denies changes in hygiene products. HPI  Past Medical History:  Diagnosis Date  . Allergy   . Asthma   . Benign prostatic hypertrophy   . Diverticulosis   . GERD (gastroesophageal reflux disease)   . Hyperlipidemia   . Hypertension   . Nephrolithiasis   . PVC's (premature ventricular contractions)   . Syncope     Patient Active Problem List   Diagnosis Date Noted  . Angina pectoris syndrome ([place]) 12/15/2018  . Nasal polyposis 03/17/2016  . Upper airway cough syndrome vs cough variant component  02/18/2015  . Cervical neck pain with evidence of disc disease = C8 nerve root distribution 06/11/2014  . Syncope 05/31/2014  . Mechanical low back pain 02/01/2014  . Hip pain, left 02/05/2012  . URI (upper respiratory infection) 10/01/2011  . Essential hypertension 09/15/2010  . RECTAL BLEEDING 09/15/2010  . BLOOD IN STOOL 09/11/2010  . 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 OSTEOPENIA 10/10/2007  . COLONIC POLYPS 07/08/2007  . Hyperlipidemia LDL goal <130 07/08/2007  . Allergic rhinitis 07/08/2007  . [place] asthma 07/08/2007  . DIVERTICULOSIS, MILD 07/08/2007  . NEPHROLITHIASIS 07/08/2007  . BENIGN PROSTATIC HYPERTROPHY, HX OF 07/08/2007    Past Surgical  History:  Procedure Laterality Date  . APPENDECTOMY    . NASAL SINUS SURGERY  Sept 2012       Home Medications    Prior to Admission medications   Medication Sig Start Date End Date Taking? Authorizing Provider  acetaminophen (TYLENOL) 500 MG tablet Take 500 mg by mouth every 6 (six) hours as needed (pain). Per bottle as needed for pain    [provider]  acyclovir (ZOVIRAX) 5 % ointment Apply 1 application topically every 3 (three) hours as needed (cold sores). Apply as directed for cold sores    [provider]  albuterol (PROAIR HFA) 108 (90 Base) MCG/ACT inhaler 2 puffs every 4 hours as needed only  if your can't catch your breath 08/04/17   [name], MD  albuterol (VENTOLIN HFA) 108 (90 Base) MCG/ACT inhaler Inhale 2 puffs into the lungs every 4 (four) hours as needed for wheezing or shortness of breath.    [provider]  budesonide (PULMICORT) 0.25 MG/2ML nebulizer solution USE 1 VIAL IN NEBULIZER TWICE DAILY. DX: J45.909 05/30/18   [name], MD  Calcium Carbonate (CALCIUM 500 PO) Take 1 tablet by mouth daily.     [provider]  Cetirizine HCl 10 MG CAPS Take 1 capsule (10 mg total) by mouth daily for 10 days. 03/27/19 04/06/19  Danaiya Steadman C, PA-C  chlorpheniramine (CHLOR-TRIMETON) 4 MG tablet Take 4 mg by mouth every 4 (four) hours as needed for allergies.  [provider]  fluticasone (FLONASE) 50 MCG/ACT nasal spray USE 1 SPRAY IN EACH NOSTRIL EVERY MORNING  AND AT BEDTIME AS NEEDED 06/23/16   Parrett, Tammy S, NP  formoterol (PERFOROMIST) 20 MCG/2ML nebulizer solution Take 2 mLs (20 mcg total) by nebulization 2 (two) times daily. DX: J45.909 01/23/19   [name], MD  LORazepam (ATIVAN) 0.5 MG tablet Take 0.5 mg by mouth daily as needed for anxiety.    [provider]  metoprolol tartrate (LOPRESSOR) 50 MG tablet Take 1/2 (one-half) tablet by mouth twice daily 02/14/19   [name], MD  montelukast  (SINGULAIR) 10 MG tablet TAKE 1 TABLET BY MOUTH ONCE DAILY IN THE EVENING 02/14/19   [name], MD  Multiple Vitamins-Minerals (CENTRUM) tablet Take 1 tablet by mouth daily.      [provider]  omeprazole (PRILOSEC) 20 MG capsule Take 1 capsule (20 mg total) by mouth daily. 04/13/18   [name], MD  oxymetazoline (AFRIN) 0.05 % nasal spray Place 1 spray into the nose 2 (two) times daily as needed for congestion (x5 days). congestion    [provider]  permethrin (ELIMITE) 5 % cream Apply head to toe, leave on 8-12 hours and rinse off 03/27/19   Tovia Kisner C, PA-C  predniSONE (DELTASONE) 50 MG tablet Take 1 tablet (50 mg total) by mouth daily for 5 days. 03/27/19 04/01/19  Valen Mascaro C, PA-C  simvastatin (ZOCOR) 40 MG tablet TAKE 1 TABLET EVERY EVENING 11/16/18   [name], MD  rosuvastatin (CRESTOR) 20 MG tablet Take 1 tablet (20 mg total) by mouth at bedtime. 08/07/11 10/01/11  [name], MD    Family History Family History  Problem Relation Age of Onset  . Heart attack Father     Social History Social History   Tobacco Use  . Smoking status: Former Smoker    Packs/day: 2.00    Years: 14.00    Pack years: 28.00    Quit date: 07/27/1969    Years since quitting: 49.6  . Smokeless tobacco: Never Used  Substance Use Topics  . Alcohol use: No  . Drug use: Not on file     Allergies   Aspirin, Amoxicillin, Amoxicillin-pot clavulanate, and Sulfonamide derivatives   Review of Systems Review of Systems  Constitutional: Negative for fatigue and fever.  Eyes: Negative for redness, itching and visual disturbance.  Respiratory: Negative for shortness of breath.   Cardiovascular: Negative for chest pain and leg swelling.  Gastrointestinal: Negative for nausea and vomiting.  Musculoskeletal: Negative for arthralgias and myalgias.  Skin: Positive for color change and rash. Negative for wound.  Neurological: Negative for dizziness, syncope,  weakness, light-headedness and headaches.     Physical Exam Triage Vital Signs ED Triage Vitals  Enc Vitals Group     BP 03/27/19 1048 (!) 155/60     Pulse Rate 03/27/19 1048 60     Resp 03/27/19 1048 18     Temp 03/27/19 1048 97.6 F (36.4 C)     Temp Source 03/27/19 1048 Oral     SpO2 03/27/19 1048 99 %     Weight 03/27/19 1050 175 lb (79.4 kg)     Height --      Head Circumference --      Peak Flow --      Pain Score 03/27/19 1050 6     Pain Loc --      Pain Edu? --  Excl. in GC? --    No data found.  Updated Vital Signs BP (!) 155/60 (BP Location: Right Arm)   Pulse 60   Temp 97.6 F (36.4 C) (Oral)   Resp 18   Wt 175 lb (79.4 kg)   SpO2 99%   BMI 26.61 kg/m   Visual Acuity Right Eye Distance:   Left Eye Distance:   Bilateral Distance:    Right Eye Near:   Left Eye Near:    Bilateral Near:     Physical Exam Vitals signs and nursing note reviewed.  Constitutional:      Appearance: He is well-developed.  HENT:     Head: Normocephalic and atraumatic.  Eyes:     Conjunctiva/sclera: Conjunctivae normal.  Neck:     Musculoskeletal: Neck supple.  Cardiovascular:     Rate and Rhythm: Normal rate and regular rhythm.     Heart sounds: No murmur.  Pulmonary:     Effort: Pulmonary effort is normal. No respiratory distress.     Breath sounds: Normal breath sounds.  Abdominal:     Palpations: Abdomen is soft.     Tenderness: There is no abdominal tenderness.  Skin:    General: Skin is warm and dry.     Comments: Multiple erythematous papules noted to back and posterior lower legs as well as and buttocks region  Minimal involvement to anterior aspect of trunk and legs No involvement of face  Neurological:     Mental Status: He is alert.      UC Treatments / Results  Labs (all labs ordered are listed, but only abnormal results are displayed) Labs Reviewed - No data to display  EKG   Radiology No results found.  Procedures Procedures  (including critical care time)  Medications Ordered in UC Medications - No data to display  Initial Impression / Assessment and Plan / UC Course  I have reviewed the triage vital signs and the nursing notes.  Pertinent labs & imaging results that were available during my care of the patient were reviewed by me and considered in my medical decision making (see chart for details).     Given amount of bumps and lesions feel this is less likely related to tick bites.  No systemic symptoms, scabies a high possibility.  Speck that patient brought in appears too small to be a tick, does not appear moving, no legs noted but cannot fully ascertain characteristic as very small speck, possible flake of skin.  Will treat for scabies with permethrin, followed by a course of prednisone, antihistamines.  Wash linens.  continue to monitor, Discussed strict return precautions. Patient verbalized understanding and is agreeable with plan.  Final Clinical Impressions(s) / UC Diagnoses   Final diagnoses:  Rash and nonspecific skin eruption     Discharge Instructions      Permethrin Topical: Cream 5%: Thoroughly massage cream (30 g for average adult) from head to soles of feet; leave on for 8 to 14 hours before removing (shower or bath); for infants and the elderly, also apply on the hairline, neck, scalp, temple, and forehead; may repeat if living mites are observed 14 days after first treatment; one application is generally curative.  Begin prednisone daily for 5 days to further help with itching Daily cetirizine/zyrtec or Claritin for itching during the day, benadryl at nightitme.   Wash all linens in hot water  Follow up if rash not resolving or worsening, developing fever, headache, nausea and vomiting   ED Prescriptions  Medication Sig Dispense Auth. Provider   permethrin (ELIMITE) 5 % cream Apply head to toe, leave on 8-12 hours and rinse off 60 g Artavis Cowie C, PA-C   predniSONE  (DELTASONE) 50 MG tablet Take 1 tablet (50 mg total) by mouth daily for 5 days. 5 tablet Jessalynn Mccowan C, PA-C   Cetirizine HCl 10 MG CAPS Take 1 capsule (10 mg total) by mouth daily for 10 days. 10 capsule Ewell Benassi C, PA-C     Controlled Substance Prescriptions [place] Controlled Substance Registry consulted? Not Applicable   Janith L[place], [place] 03/27/19 1134

## 2019-03-27 NOTE — Discharge Instructions (Signed)
Permethrin Topical: Cream 5%: Thoroughly massage cream (30 g for average adult) from head to soles of feet; leave on for 8 to 14 hours before removing (shower or bath); for infants and the elderly, also apply on the hairline, neck, scalp, temple, and forehead; may repeat if living mites are observed 14 days after first treatment; one application is generally curative.  Begin prednisone daily for 5 days to further help with itching Daily cetirizine/zyrtec or Claritin for itching during the day, benadryl at nightitme.   Wash all linens in hot water  Follow up if rash not resolving or worsening, developing fever, headache, nausea and vomiting

## 2019-04-05 DIAGNOSIS — M79604 Pain in right leg: Secondary | ICD-10-CM | POA: Diagnosis not present

## 2019-04-05 DIAGNOSIS — M79605 Pain in left leg: Secondary | ICD-10-CM | POA: Diagnosis not present

## 2019-04-05 DIAGNOSIS — M25559 Pain in unspecified hip: Secondary | ICD-10-CM | POA: Diagnosis not present

## 2019-04-06 ENCOUNTER — Telehealth (HOSPITAL_COMMUNITY): Payer: Self-pay | Admitting: Emergency Medicine

## 2019-04-06 LAB — BASIC METABOLIC PANEL
BUN/Creatinine Ratio: 19 (ref 10–24)
BUN: 18 mg/dL (ref 8–27)
CO2: 26 mmol/L (ref 20–29)
Calcium: 9.3 mg/dL (ref 8.6–10.2)
Chloride: 98 mmol/L (ref 96–106)
Creatinine, Ser: 0.95 mg/dL (ref 0.76–1.27)
GFR calc Af Amer: 90 mL/min/{1.73_m2} (ref >59)
GFR calc non Af Amer: 78 mL/min/{1.73_m2} (ref >59)
Glucose: 135 mg/dL — ABNORMAL HIGH (ref 65–99)
Potassium: 5.2 mmol/L (ref 3.5–5.2)
Sodium: 139 mmol/L (ref 134–144)

## 2019-04-06 NOTE — Telephone Encounter (Signed)
Left message on voicemail with name and callback number Shayana Hornstein RN Navigator [facility] [phone] Office [phone] Cell  

## 2019-04-07 ENCOUNTER — Encounter (HOSPITAL_COMMUNITY): Payer: Self-pay

## 2019-04-07 ENCOUNTER — Ambulatory Visit (HOSPITAL_COMMUNITY)
Admission: RE | Admit: 2019-04-07 | Discharge: 2019-04-07 | Disposition: A | Discharge status: Home / Self Care | Payer: Medicare Other | Source: Ambulatory Visit | Attending: Internal Medicine | Admitting: Internal Medicine

## 2019-04-07 ENCOUNTER — Ambulatory Visit (HOSPITAL_COMMUNITY): Payer: Medicare Other

## 2019-04-07 ENCOUNTER — Other Ambulatory Visit: Payer: Self-pay

## 2019-04-07 DIAGNOSIS — R072 Precordial pain: Secondary | ICD-10-CM | POA: Diagnosis not present

## 2019-04-07 MED ORDER — IOHEXOL 350 MG/ML SOLN
80.0000 mL | Freq: Once | INTRAVENOUS | Status: AC | PRN
  Administered 2019-04-07: 10:00:00 80 mL via INTRAVENOUS

## 2019-04-07 MED ORDER — NITROGLYCERIN 0.4 MG SL SUBL
SUBLINGUAL_TABLET | SUBLINGUAL | Status: AC
  Filled 2019-04-07: qty 2

## 2019-04-07 MED ORDER — NITROGLYCERIN 0.4 MG SL SUBL
0.8000 mg | SUBLINGUAL_TABLET | Freq: Once | SUBLINGUAL | Status: AC
  Administered 2019-04-07: 10:00:00 0.8 mg via SUBLINGUAL
  Filled 2019-04-07: qty 25

## 2019-04-07 NOTE — Progress Notes (Signed)
Pt tolerated exam without incident.  PIV removed, dressing applied.  Caffeinated beverage and crackers provided to patient.  Discharge instructions discussed, all questions answered.  Pt discharged

## 2019-04-07 NOTE — Discharge Instructions (Signed)
Testing With IV Contrast Material °IV contrast material is a fluid that is used with some imaging tests. It is injected into your body through a vein. Contrast material is used when your health care providers need a detailed look at organs, tissues, or blood vessels that may not show up with the standard test. The material may be used when an X-ray, an MRI, a CT scan, or an ultrasound is done. °IV contrast material may be used for imaging tests that check: °· Muscles, skin, and fat. °· Breasts. °· Brain. °· Digestive tract. °· Heart. °· Organs such as the liver, kidneys, lungs, bladder, and many others. °· Arteries and veins. °Tell a health care provider about: °· Any allergies you have, especially an allergy to contrast material. °· All medicines you are taking, including metformin, beta blockers, NSAIDs (such as ibuprofen), interleukin-2, vitamins, herbs, eye drops, creams, and over-the-counter medicines. °· Any problems you or family members have had with the use of contrast material. °· Any blood disorders you have, such as sickle cell anemia. °· Any surgeries you have had. °· Any medical conditions you have or have had, especially alcohol abuse, dehydration, asthma, or kidney, liver, or heart problems. °· Whether you are pregnant or may be pregnant. °· Whether you are breastfeeding. Most contrast materials are safe for use in breastfeeding women. °What are the risks? °Generally, this is a safe procedure. However, problems may occur, including: °· Headache. °· Itching, skin rash, and hives. °· Nausea and vomiting. °· Allergic reactions. °· Wheezing or difficulty breathing. °· Abnormal heart rate. °· Changes in blood pressure. °· Throat swelling. °· Kidney damage. °What happens before the procedure? °Medicines °Ask your health care provider about: °· Changing or stopping your regular medicines. This is especially important if you are taking diabetes medicines or blood thinners. °· Taking medicines such as aspirin  and ibuprofen. These medicines can thin your blood. Do not take these medicines unless your health care provider tells you to take them. °· Taking over-the-counter medicines, vitamins, herbs, and supplements. °If you are at risk of having a reaction to the IV contrast material, you may be asked to take medicine before the procedure to prevent a reaction. °General instructions °· Follow instructions from your health care provider about eating or drinking restrictions. °· You may have an exam or lab tests to make sure that you can safely get IV contrast material. °· Ask if you will be given a medicine to help you relax (sedative) during the procedure. If so, plan to have someone take you home from the hospital or clinic. °What happens during the procedure? °· You may be given a sedative to help you relax. °· An IV will be inserted into one of your veins. °· Contrast material will be injected into your IV. °· You may feel warmth or flushing as the contrast material enters your bloodstream. °· You may have a metallic taste in your mouth for a few minutes. °· The needle may cause some discomfort and bruising. °· After the contrast material is in your body, the imaging test will be done. °The procedure may vary among health care providers and hospitals. °What can I expect after the procedure? °· The IV will be removed. °· You may be taken to a recovery area if sedation medicines were used. Your blood pressure, heart rate, breathing rate, and blood oxygen level will be monitored until you leave the hospital or clinic. °Follow these instructions at home: ° °· Take over-the-counter and   prescription medicines only as told by your health care provider. °? Your health care provider may tell you to not take certain medicines for a couple of days after the procedure. This is especially important if you are taking diabetes medicines. °· If you are told, drink enough fluid to keep your urine pale yellow. This will help to remove  the contrast material out of your body. °· Do not drive for 24 hours if you were given a sedative during your procedure. °· It is up to you to get the results of your procedure. Ask your health care provider, or the department that is doing the procedure, when your results will be ready. °· Keep all follow-up visits as told by your health care provider. This is important. °Contact a health care provider if: °· You have redness, swelling, or pain near your IV site. °Get help right away if: °· You have an abnormal heart rhythm. °· You have trouble breathing. °· You have: °? Chest pain. °? Pain in your back, neck, arm, jaw, or stomach. °? Nausea or sweating. °? Hives or a rash. °· You start shaking and cannot stop. °These symptoms may represent a serious problem that is an emergency. Do not wait to see if the symptoms will go away. Get medical help right away. Call your local emergency services (911 in the [place] Do not drive yourself to the hospital. °Summary °· IV contrast material may be used for imaging tests to help your health care providers see your organs and tissues more clearly. °· Tell your health care provider if you are pregnant or may be pregnant. °· During the procedure, you may feel warmth or flushing as the contrast material enters your bloodstream. °· After the procedure, drink enough fluid to keep your urine pale yellow. °This information is not intended to replace advice given to you by your health care provider. Make sure you discuss any questions you have with your health care provider. °Document Released: 07/01/2009 Document Revised: 09/29/2018 Document Reviewed: 09/29/2018 °Elsevier Patient Education © 2020 [employer] ° ° °Cardiac CT Angiogram ° °A cardiac CT angiogram is a procedure to look at the heart and the area around the heart. It may be done to help find the cause of chest pains or other symptoms of heart disease. During this procedure, a large X-ray machine, called a CT scanner,  takes detailed pictures of the heart and the surrounding area after a dye (contrast material) has been injected into blood vessels in the area. The procedure is also sometimes called a coronary CT angiogram, coronary artery scanning, or CTA. °A cardiac CT angiogram allows the health care provider to see how well blood is flowing to and from the heart. The health care provider will be able to see if there are any problems, such as: °· Blockage or narrowing of the coronary arteries in the heart. °· Fluid around the heart. °· Signs of weakness or disease in the muscles, valves, and tissues of the heart. °Tell a health care provider about: °· Any allergies you have. This is especially important if you have had a previous allergic reaction to contrast dye. °· All medicines you are taking, including vitamins, herbs, eye drops, creams, and over-the-counter medicines. °· Any blood disorders you have. °· Any surgeries you have had. °· Any medical conditions you have. °· Whether you are pregnant or may be pregnant. °· Any anxiety disorders, chronic pain, or other conditions you have that may increase your stress or prevent   you from lying still. °What are the risks? °Generally, this is a safe procedure. However, problems may occur, including: °· Bleeding. °· Infection. °· Allergic reactions to medicines or dyes. °· Damage to other structures or organs. °· Kidney damage from the dye or contrast that is used. °· Increased risk of cancer from radiation exposure. This risk is low. Talk with your health care provider about: °? The risks and benefits of testing. °? How you can receive the lowest dose of radiation. °What happens before the procedure? °· Wear comfortable clothing and remove any jewelry, glasses, dentures, and hearing aids. °· Follow instructions from your health care provider about eating and drinking. This may include: °? For 12 hours before the test -- avoid caffeine. This includes tea, coffee, soda, energy drinks,  and diet pills. Drink plenty of water or other fluids that do not have caffeine in them. Being well-hydrated can prevent complications. °? For 4-6 hours before the test -- stop eating and drinking. The contrast dye can cause nausea, but this is less likely if your stomach is empty. °· Ask your health care provider about changing or stopping your regular medicines. This is especially important if you are taking diabetes medicines, blood thinners, or medicines to treat erectile dysfunction. °What happens during the procedure? °· Hair on your chest may need to be removed so that small sticky patches called electrodes can be placed on your chest. These will transmit information that helps to monitor your heart during the test. °· An IV tube will be inserted into one of your veins. °· You might be given a medicine to control your heart rate during the test. This will help to ensure that good images are obtained. °· You will be asked to lie on an exam table. This table will slide in and out of the CT machine during the procedure. °· Contrast dye will be injected into the IV tube. You might feel warm, or you may get a metallic taste in your mouth. °· You will be given a medicine (nitroglycerin) to relax (dilate) the arteries in your heart. °· The table that you are lying on will move into the CT machine tunnel for the scan. °· The person running the machine will give you instructions while the scans are being done. You may be asked to: °? Keep your arms above your head. °? Hold your breath. °? Stay very still, even if the table is moving. °· When the scanning is complete, you will be moved out of the machine. °· The IV tube will be removed. °The procedure may vary among health care providers and hospitals. °What happens after the procedure? °· You might feel warm, or you may get a metallic taste in your mouth from the contrast dye. °· You may have a headache from the nitroglycerin. °· After the procedure, drink water or  other fluids to wash (flush) the contrast material out of your body. °· Contact a health care provider if you have any symptoms of allergy to the contrast. These symptoms include: °? Shortness of breath. °? Rash or hives. °? A racing heartbeat. °· Most people can return to their normal activities right after the procedure. Ask your health care provider what activities are safe for you. °· It is up to you to get the results of your procedure. Ask your health care provider, or the department that is doing the procedure, when your results will be ready. °Summary °· A cardiac CT angiogram is a procedure to   look at the heart and the area around the heart. It may be done to help find the cause of chest pains or other symptoms of heart disease. °· During this procedure, a large X-ray machine, called a CT scanner, takes detailed pictures of the heart and the surrounding area after a dye (contrast material) has been injected into blood vessels in the area. °· Ask your health care provider about changing or stopping your regular medicines before the procedure. This is especially important if you are taking diabetes medicines, blood thinners, or medicines to treat erectile dysfunction. °· After the procedure, drink water or other fluids to wash (flush) the contrast material out of your body. °This information is not intended to replace advice given to you by your health care provider. Make sure you discuss any questions you have with your health care provider. °Document Released: 06/25/2008 Document Revised: 06/25/2017 Document Reviewed: 06/01/2016 °Elsevier Patient Education © 2020 [employer] ° °

## 2019-04-10 DIAGNOSIS — I2510 Atherosclerotic heart disease of native coronary artery without angina pectoris: Secondary | ICD-10-CM | POA: Diagnosis not present

## 2019-04-11 ENCOUNTER — Telehealth (INDEPENDENT_AMBULATORY_CARE_PROVIDER_SITE_OTHER): Payer: Medicare Other | Admitting: Internal Medicine

## 2019-04-11 ENCOUNTER — Telehealth: Payer: Self-pay | Admitting: Internal Medicine

## 2019-04-11 ENCOUNTER — Encounter: Payer: Self-pay | Admitting: Internal Medicine

## 2019-04-11 DIAGNOSIS — I209 Angina pectoris, unspecified: Secondary | ICD-10-CM | POA: Diagnosis not present

## 2019-04-11 DIAGNOSIS — I2510 Atherosclerotic heart disease of native coronary artery without angina pectoris: Secondary | ICD-10-CM | POA: Diagnosis not present

## 2019-04-11 DIAGNOSIS — E782 Mixed hyperlipidemia: Secondary | ICD-10-CM

## 2019-04-11 DIAGNOSIS — I10 Essential (primary) hypertension: Secondary | ICD-10-CM

## 2019-04-11 DIAGNOSIS — Z01812 Encounter for preprocedural laboratory examination: Secondary | ICD-10-CM

## 2019-04-11 DIAGNOSIS — E785 Hyperlipidemia, unspecified: Secondary | ICD-10-CM | POA: Diagnosis not present

## 2019-04-11 NOTE — Progress Notes (Signed)
Virtual Visit via Telephone Note   This visit type was conducted due to national recommendations for restrictions regarding the COVID-19 Pandemic (e.g. social distancing) in an effort to limit this patient's exposure and mitigate transmission in our community.  Due to his co-morbid illnesses, this patient is at least at moderate risk for complications without adequate follow up.  This format is felt to be most appropriate for this patient at this time.  The patient did not have access to video technology/had technical difficulties with video requiring transitioning to audio format only (telephone).  All issues noted in this document were discussed and addressed.  No physical exam could be performed with this format.  Please refer to the patient's chart for his  consent to telehealth for [facility].   Evaluation Performed:  Telephone follow-up  Date:  04/11/2019   ID:  Antonio Carlson, DOB 1945/09/30, MRN [id number]  Patient Location:  [place] [id number]  Provider location:   [address] [place], [place] [address]  PCP:  [name], MD  Cardiologist:  No primary care provider on file. Electrophysiologist:  None   Chief Complaint:  Chest pain  History of Present Illness:    Antonio Carlson is a 73 y.o. male who presents via audio/video conferencing for a telehealth visit today.  Antonio Carlson returns today for telephone follow-up of coronary artery CT scan.  This was performed last Friday.  It was ordered for burning chest discomfort he got with exertion that was relieved at rest, however he also had some symptoms at rest and some atypical symptoms as well.  He does have multiple cardiovascular risk factors and strong family history of early heart disease in his father.  The results of his coronary artery CT scan were significantly abnormal.  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  The study was sent for FFR no significant left main stenosis, significant distal LAD stenosis of 0.76 with gradual tapering of the vessel, significant left circumflex stenosis with FFR 0.68, and severe and significant stenosis of the RCA with the proximal FFR of 0.51 just distal to the lesion.  Calcium score was significantly elevated 1189.  Overall findings support multivessel coronary disease.  I discussed those findings with him today my recommendations for cardiac catheterization primarily for confirmation, as I suspect he will likely need bypass surgery.  The patient does not have symptoms concerning for COVID-19 infection (fever, chills, cough, or new SHORTNESS OF BREATH).    Prior CV studies:   The following studies were reviewed today:  Coronary artery CT scan CT FFR  PMHx:  Past Medical History:  Diagnosis Date   Allergy    Asthma    Benign prostatic hypertrophy    Diverticulosis    GERD (gastroesophageal reflux disease)    Hyperlipidemia    Hypertension    Nephrolithiasis    PVC's (premature ventricular contractions)    Syncope     Past Surgical History:  Procedure Laterality Date   APPENDECTOMY     NASAL SINUS SURGERY  Sept 2012    FAMHx:  Family History  Problem Relation Age of Onset   Heart attack Father     SOCHx:   reports that he quit smoking about 49 years ago. He has a  28.00 pack-year smoking history. He has never used smokeless tobacco. He reports that he does not drink alcohol. No history on file for drug.  ALLERGIES:  Allergies  Allergen Reactions   Aspirin     REACTION: sob   Amoxicillin     REACTION: gi upset   Amoxicillin-Pot Clavulanate     REACTION: gi upset   Sulfonamide Derivatives Other (See Comments)    Doesn't remember    MEDS:  Current Meds  Medication Sig    acetaminophen (TYLENOL) 500 MG tablet Take 500 mg by mouth every 6 (six) hours as needed (pain). Per bottle as needed for pain   acyclovir (ZOVIRAX) 5 % ointment Apply 1 application topically every 3 (three) hours as needed (cold sores). Apply as directed for cold sores   albuterol (PROAIR HFA) 108 (90 Base) MCG/ACT inhaler 2 puffs every 4 hours as needed only  if your can't catch your breath   albuterol (VENTOLIN HFA) 108 (90 Base) MCG/ACT inhaler Inhale 2 puffs into the lungs every 4 (four) hours as needed for wheezing or shortness of breath.   budesonide (PULMICORT) 0.25 MG/2ML nebulizer solution USE 1 VIAL IN NEBULIZER TWICE DAILY. DX: J45.909   Calcium Carbonate (CALCIUM 500 PO) Take 1 tablet by mouth daily.    chlorpheniramine (CHLOR-TRIMETON) 4 MG tablet Take 4 mg by mouth every 4 (four) hours as needed for allergies.   fluticasone (FLONASE) 50 MCG/ACT nasal spray USE 1 SPRAY IN EACH NOSTRIL EVERY MORNING  AND AT BEDTIME AS NEEDED   formoterol (PERFOROMIST) 20 MCG/2ML nebulizer solution Take 2 mLs (20 mcg total) by nebulization 2 (two) times daily. DX: J45.909   LORazepam (ATIVAN) 0.5 MG tablet Take 0.5 mg by mouth daily as needed for anxiety.   metoprolol tartrate (LOPRESSOR) 50 MG tablet Take 1/2 (one-half) tablet by mouth twice daily   montelukast (SINGULAIR) 10 MG tablet TAKE 1 TABLET BY MOUTH ONCE DAILY IN THE EVENING   Multiple Vitamins-Minerals (CENTRUM) tablet Take 1 tablet by mouth daily.     omeprazole (PRILOSEC) 20 MG capsule Take 1 capsule (20 mg total) by mouth daily.   oxymetazoline (AFRIN) 0.05 % nasal spray Place 1 spray into the nose 2 (two) times daily as needed for congestion (x5 days). congestion   simvastatin (ZOCOR) 40 MG tablet TAKE 1 TABLET EVERY EVENING     ROS: Pertinent items noted in HPI and remainder of comprehensive ROS otherwise negative.  Labs/Other Tests and Data Reviewed:    Recent Labs: 12/15/2018: ALT 21; Hemoglobin 15.8; Platelets  240.0; TSH 1.81 04/05/2019: BUN 18; Creatinine, Ser 0.95; Potassium 5.2; Sodium 139   Recent Lipid Panel Lab Results  Component Value Date/Time   CHOL 181 12/15/2018 11:40 AM   TRIG 138.0 12/15/2018 11:40 AM   HDL 51.20 12/15/2018 11:40 AM   CHOLHDL 4 12/15/2018 11:40 AM   LDLCALC 102 (H) 12/15/2018 11:40 AM   LDLDIRECT 122.0 08/20/2014 09:39 AM    Wt Readings from Last 3 Encounters:  03/27/19 175 lb (79.4 kg)  03/16/19 179 lb 6.4 oz (81.4 kg)  03/15/19 178 lb 12.8 oz (81.1 kg)     Exam:    Vital Signs:  There were no vitals taken for this visit.   Exam not performed due to telephone visit  ASSESSMENT & PLAN:    1. Angina pectoris -abnormal CT FFR with multivessel coronary disease, significant by FFR in all 3 vessels and high coronary calcium score 1189. 1. Asthma 2. Hypertension 3. Dyslipidemia 4. Bilateral hip pain  5. Asymptomatic bilateral varicose veins  Mr. Pop is describing angina and was found to have an abnormal CT FFR with multivessel coronary disease, most significant in the RCA and circumflex however the distal LAD was also significant with tapering disease.  There is at least moderate to severe proximal disease and I suspect this is significant as well.  I am recommending left heart catheterization to further evaluate this however suspect that ultimately coronary artery bypass grafting would be the best option for him.  I discussed the risk, benefits and alternatives of heart catheterization as well as the procedure in detail with him today and he is agreeable to proceed.  I will discuss with his wife about timing but I would hope to get this scheduled within the next week or 2.  COVID-19 Education: The signs and symptoms of COVID-19 were discussed with the patient and how to seek care for testing (follow up with PCP or arrange E-visit).  The importance of social distancing was discussed today.  Patient Risk:   After full review of this patients clinical status,  I feel that they are at least moderate risk at this time.  Time:   Today, I have spent 25 minutes with the patient with telehealth technology discussing coronary artery CT findings, FFR findings, cardiac catheterization procedure including risks and benefits as well as alternatives..     Medication Adjustments/Labs and Tests Ordered: Current medicines are reviewed at length with the patient today.  Concerns regarding medicines are outlined above.   Tests Ordered: No orders of the defined types were placed in this encounter.   Medication Changes: No orders of the defined types were placed in this encounter.   Disposition:  in 2 month(s)  Pixie Casino, MD, [facility], [place] Director of the Advanced Lipid Disorders &  Cardiovascular Risk Reduction Clinic Diplomate of the American Board of Clinical Lipidology Attending Cardiologist  Direct Dial: [phone]   Fax: [phone]  Website:  [email or web address]  Pixie Casino, MD  04/11/2019 9:45 AM

## 2019-04-11 NOTE — Telephone Encounter (Signed)
LM with man answering phone to have patient return call - need to arrange LHC per MD note 04/11/2019 and COVID19 screening.   Procedure, labs, COVID19 test have been ordered

## 2019-04-11 NOTE — H&P (View-Only) (Signed)
Virtual Visit via Telephone Note   This visit type was conducted due to national recommendations for restrictions regarding the COVID-19 Pandemic (e.g. social distancing) in an effort to limit this patient's exposure and mitigate transmission in our community.  Due to his co-morbid illnesses, this patient is at least at moderate risk for complications without adequate follow up.  This format is felt to be most appropriate for this patient at this time.  The patient did not have access to video technology/had technical difficulties with video requiring transitioning to audio format only (telephone).  All issues noted in this document were discussed and addressed.  No physical exam could be performed with this format.  Please refer to the patient's chart for his  consent to telehealth for [facility].   Evaluation Performed:  Telephone follow-up  Date:  04/11/2019   ID:  LAVARR DURNIN, DOB July 30, 1945, MRN [id number]  Patient Location:  [place] [address]  Provider location:   [address] [place], [place] [address]  PCP:  [name], MD  Cardiologist:  No primary care provider on file. Electrophysiologist:  None   Chief Complaint:  Chest pain  History of Present Illness:    LESLIE DESCHENES is a 73 y.o. male who presents via audio/video conferencing for a telehealth visit today.  Mr. Stempel returns today for telephone follow-up of coronary artery CT scan.  This was performed last Friday.  It was ordered for burning chest discomfort he got with exertion that was relieved at rest, however he also had some symptoms at rest and some atypical symptoms as well.  He does have multiple cardiovascular risk factors and strong family history of early heart disease in his father.  The results of his coronary artery CT scan were significantly abnormal.  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  The study was sent for FFR no significant left main stenosis, significant distal LAD stenosis of 0.76 with gradual tapering of the vessel, significant left circumflex stenosis with FFR 0.68, and severe and significant stenosis of the RCA with the proximal FFR of 0.51 just distal to the lesion.  Calcium score was significantly elevated 1189.  Overall findings support multivessel coronary disease.  I discussed those findings with him today my recommendations for cardiac catheterization primarily for confirmation, as I suspect he will likely need bypass surgery.  The patient does not have symptoms concerning for COVID-19 infection (fever, chills, cough, or new SHORTNESS OF BREATH).    Prior CV studies:   The following studies were reviewed today:  Coronary artery CT scan CT FFR  PMHx:  Past Medical History:  Diagnosis Date   Allergy    Asthma    Benign prostatic hypertrophy    Diverticulosis    GERD (gastroesophageal reflux disease)    Hyperlipidemia    Hypertension    Nephrolithiasis    PVC's (premature ventricular contractions)    Syncope     Past Surgical History:  Procedure Laterality Date   APPENDECTOMY     NASAL SINUS SURGERY  Sept 2012    FAMHx:  Family History  Problem Relation Age of Onset   Heart attack Father     SOCHx:   reports that he quit smoking about 49 years ago. He has a  28.00 pack-year smoking history. He has never used smokeless tobacco. He reports that he does not drink alcohol. No history on file for drug.  ALLERGIES:  Allergies  Allergen Reactions   Aspirin     REACTION: sob   Amoxicillin     REACTION: gi upset   Amoxicillin-Pot Clavulanate     REACTION: gi upset   Sulfonamide Derivatives Other (See Comments)    Doesn't remember    MEDS:  Current Meds  Medication Sig    acetaminophen (TYLENOL) 500 MG tablet Take 500 mg by mouth every 6 (six) hours as needed (pain). Per bottle as needed for pain   acyclovir (ZOVIRAX) 5 % ointment Apply 1 application topically every 3 (three) hours as needed (cold sores). Apply as directed for cold sores   albuterol (PROAIR HFA) 108 (90 Base) MCG/ACT inhaler 2 puffs every 4 hours as needed only  if your can't catch your breath   albuterol (VENTOLIN HFA) 108 (90 Base) MCG/ACT inhaler Inhale 2 puffs into the lungs every 4 (four) hours as needed for wheezing or shortness of breath.   budesonide (PULMICORT) 0.25 MG/2ML nebulizer solution USE 1 VIAL IN NEBULIZER TWICE DAILY. DX: J45.909   Calcium Carbonate (CALCIUM 500 PO) Take 1 tablet by mouth daily.    chlorpheniramine (CHLOR-TRIMETON) 4 MG tablet Take 4 mg by mouth every 4 (four) hours as needed for allergies.   fluticasone (FLONASE) 50 MCG/ACT nasal spray USE 1 SPRAY IN EACH NOSTRIL EVERY MORNING  AND AT BEDTIME AS NEEDED   formoterol (PERFOROMIST) 20 MCG/2ML nebulizer solution Take 2 mLs (20 mcg total) by nebulization 2 (two) times daily. DX: J45.909   LORazepam (ATIVAN) 0.5 MG tablet Take 0.5 mg by mouth daily as needed for anxiety.   metoprolol tartrate (LOPRESSOR) 50 MG tablet Take 1/2 (one-half) tablet by mouth twice daily   montelukast (SINGULAIR) 10 MG tablet TAKE 1 TABLET BY MOUTH ONCE DAILY IN THE EVENING   Multiple Vitamins-Minerals (CENTRUM) tablet Take 1 tablet by mouth daily.     omeprazole (PRILOSEC) 20 MG capsule Take 1 capsule (20 mg total) by mouth daily.   oxymetazoline (AFRIN) 0.05 % nasal spray Place 1 spray into the nose 2 (two) times daily as needed for congestion (x5 days). congestion   simvastatin (ZOCOR) 40 MG tablet TAKE 1 TABLET EVERY EVENING     ROS: Pertinent items noted in HPI and remainder of comprehensive ROS otherwise negative.  Labs/Other Tests and Data Reviewed:    Recent Labs: 12/15/2018: ALT 21; Hemoglobin 15.8; Platelets  240.0; TSH 1.81 04/05/2019: BUN 18; Creatinine, Ser 0.95; Potassium 5.2; Sodium 139   Recent Lipid Panel Lab Results  Component Value Date/Time   CHOL 181 12/15/2018 11:40 AM   TRIG 138.0 12/15/2018 11:40 AM   HDL 51.20 12/15/2018 11:40 AM   CHOLHDL 4 12/15/2018 11:40 AM   LDLCALC 102 (H) 12/15/2018 11:40 AM   LDLDIRECT 122.0 08/20/2014 09:39 AM    Wt Readings from Last 3 Encounters:  03/27/19 175 lb (79.4 kg)  03/16/19 179 lb 6.4 oz (81.4 kg)  03/15/19 178 lb 12.8 oz (81.1 kg)     Exam:    Vital Signs:  There were no vitals taken for this visit.   Exam not performed due to telephone visit  ASSESSMENT & PLAN:    1. Angina pectoris -abnormal CT FFR with multivessel coronary disease, significant by FFR in all 3 vessels and high coronary calcium score 1189. 1. Asthma 2. Hypertension 3. Dyslipidemia 4. Bilateral hip pain  5. Asymptomatic bilateral varicose veins  Mr. Loos is describing angina and was found to have an abnormal CT FFR with multivessel coronary disease, most significant in the RCA and circumflex however the distal LAD was also significant with tapering disease.  There is at least moderate to severe proximal disease and I suspect this is significant as well.  I am recommending left heart catheterization to further evaluate this however suspect that ultimately coronary artery bypass grafting would be the best option for him.  I discussed the risk, benefits and alternatives of heart catheterization as well as the procedure in detail with him today and he is agreeable to proceed.  I will discuss with his wife about timing but I would hope to get this scheduled within the next week or 2.  COVID-19 Education: The signs and symptoms of COVID-19 were discussed with the patient and how to seek care for testing (follow up with PCP or arrange E-visit).  The importance of social distancing was discussed today.  Patient Risk:   After full review of this patients clinical status,  I feel that they are at least moderate risk at this time.  Time:   Today, I have spent 25 minutes with the patient with telehealth technology discussing coronary artery CT findings, FFR findings, cardiac catheterization procedure including risks and benefits as well as alternatives..     Medication Adjustments/Labs and Tests Ordered: Current medicines are reviewed at length with the patient today.  Concerns regarding medicines are outlined above.   Tests Ordered: No orders of the defined types were placed in this encounter.   Medication Changes: No orders of the defined types were placed in this encounter.   Disposition:  in 2 month(s)  Pixie Casino, MD, [facility], [place] Director of the Advanced Lipid Disorders &  Cardiovascular Risk Reduction Clinic Diplomate of the American Board of Clinical Lipidology Attending Cardiologist  Direct Dial: [phone]   Fax: [phone]  Website:  [email or web address]  Pixie Casino, MD  04/11/2019 9:45 AM

## 2019-04-11 NOTE — Patient Instructions (Signed)
Medication Instructions:  NO CHANGES If you need a refill on your cardiac medications before your next appointment, please call your pharmacy.   Lab work: BMET & CBC prior to heart cath If you have labs (blood work) drawn today and your tests are completely normal, you will receive your results only by: [name] Kitchen MyChart Message (if you have MyChart) OR . A paper copy in the mail If you have any lab test that is abnormal or we need to change your treatment, we will call you to review the results.  Testing/Procedures: Dr. Debara Pickett recommends a cardiac catheterization. Instructions noted below  Follow-Up: At [facility], you and your health needs are our priority.  As part of our continuing mission to provide you with exceptional heart care, we have created designated Provider Care Teams.  These Care Teams include your primary Cardiologist (physician) and Advanced Practice Providers (APPs -  Physician Assistants and Nurse Practitioners) who all work together to provide you with the care you need, when you need it. You will need a follow up appointment in 3-4 weeks after heart cath.  You may see Dr. Debara Pickett or one of the following Advanced Practice Providers on your designated Care Team: Almyra Deforest, [place] . Fabian Sharp, PA-C  Any Other Special Instructions Will Be Listed Below (If Applicable).     [place] [facility] [address] [place] [address] Dept: [phone] Loc: [phone]  DAUNDRE HUNTSBERGER  04/11/2019  You are scheduled for a Cardiac Catheterization on ------------,  September ------ with Dr ----------.  1. Please arrive at the [facility] (Main Entrance A) at [facility]: [address] [place], [place] [address] at ----------. (This time is two hours before your procedure to ensure your preparation). Free valet parking service is available.   Special note: Every effort is made to have your  procedure done on time. Please understand that emergencies sometimes delay scheduled procedures.  2. Diet: Do not eat solid foods after midnight.  You may have clear liquids until 5am upon the day of the procedure.  3. Labs: You will need to have blood drawn 3-5 days prior to your procedure.  -- You can use the lab in Dr. Lysbeth Penner office @ [address] or any [facility] testing location convenient for you.  -- You do not have to be fasting.  4. Medication instructions in preparation for your procedure: -- On the morning of your procedure, take any normal/routine morning medicines.  You may use sips of water.  5. Plan for one night stay--bring personal belongings. 6. Bring a current list of your medications and current insurance cards. 7. You MUST have a responsible person to drive you home. 8. Someone MUST be with you the first 24 hours after you arrive home or your discharge will be delayed. 9. Please wear clothes that are easy to get on and off and wear slip-on shoes.  Thank you for allowing [place] to care for you!   -- [place] [facility]

## 2019-04-12 ENCOUNTER — Telehealth: Payer: Self-pay | Admitting: Internal Medicine

## 2019-04-12 NOTE — Telephone Encounter (Signed)
Spoke with patient who agrees to [facility] next week. He is aware he needs a COVID19 screening and will have to quarantine from screening test until date of [place] [phone]  Scheduled LHC on 04/18/19 with Dr. Daneen Schick arrival time of 5:30am.

## 2019-04-12 NOTE — Telephone Encounter (Signed)
LMTCB - patient needs LHC arranged per telemedicine note on 04/11/2019. Need to know which day(s) patient is available and inform patient of labs needed, COVID19 screening needed, instructions for procedure

## 2019-04-12 NOTE — Telephone Encounter (Signed)
New Message ° ° °Patient returning your call. Please call patient back. °

## 2019-04-12 NOTE — Telephone Encounter (Signed)
Patient aware of cath date 04/18/2019 and arrival time of 0530. He will have covid19 screening on 04/14/2019 @ [place]. He is aware of location. He will come to [facility] office to pick up instructions. Per MD, BMET from 04/05/19 is OK and patient can be CBC on arrival to hospital day of cath.

## 2019-04-14 ENCOUNTER — Other Ambulatory Visit: Payer: Self-pay | Admitting: Internal Medicine

## 2019-04-14 ENCOUNTER — Telehealth: Payer: Self-pay | Admitting: Internal Medicine

## 2019-04-14 ENCOUNTER — Other Ambulatory Visit (HOSPITAL_COMMUNITY)
Admission: RE | Admit: 2019-04-14 | Discharge: 2019-04-14 | Disposition: A | Discharge status: Home / Self Care | Payer: Medicare Other | Source: Ambulatory Visit | Attending: Interventional Cardiology | Admitting: Interventional Cardiology

## 2019-04-14 DIAGNOSIS — Z01812 Encounter for preprocedural laboratory examination: Secondary | ICD-10-CM | POA: Diagnosis not present

## 2019-04-14 DIAGNOSIS — I209 Angina pectoris, unspecified: Secondary | ICD-10-CM

## 2019-04-14 DIAGNOSIS — I2510 Atherosclerotic heart disease of native coronary artery without angina pectoris: Secondary | ICD-10-CM

## 2019-04-14 DIAGNOSIS — Z20828 Contact with and (suspected) exposure to other viral communicable diseases: Secondary | ICD-10-CM | POA: Diagnosis not present

## 2019-04-14 NOTE — Telephone Encounter (Signed)
New Message   Patient would like to speak to someone about his upcoming procedure.

## 2019-04-14 NOTE — Telephone Encounter (Signed)
Patient contacted and he wanted to know since he has to quarantine, if he is able to go out to eat at at steakhouse for his granddaughter's birthday. Advised that he is supposed to quarantine until his heart cath is completed. Patient verbalized understanding.

## 2019-04-14 NOTE — Telephone Encounter (Signed)
Cath instruction letter printed for patient to pick up today 04/14/2019.   Cath hospital orders completed.

## 2019-04-15 LAB — NOVEL CORONAVIRUS, NAA (HOSP ORDER, SEND-OUT TO REF LAB; TAT 18-24 HRS): SARS-CoV-2, NAA: NOT DETECTED

## 2019-04-16 NOTE — H&P (Signed)
Possible three-vessel disease with left main, tight mid to distal circumflex, and significant RCA.  After analyzing the CT images, rule out RCA total occlusion.  If RCA is antegrade patent, may need to stents including proximal 18 mm stent and mid 50 mm stent.  High coronary calcium score, making the possibility that rotational or orbital atherectomy could be needed.

## 2019-04-17 ENCOUNTER — Telehealth: Payer: Self-pay | Admitting: *Deleted

## 2019-04-17 NOTE — Telephone Encounter (Signed)
Pt contacted pre-catheterization scheduled at [facility] for: Verified arrival time and place: [facility]) at:   No solid food after midnight prior to cath, clear liquids until 5 AM day of procedure. Contrast allergy:   AM meds can be  taken pre-cath with sip of water. Pt states he is highly allergic to aspirin and cannot take it-causes significant shortness of breath within a short period of time after taking it.  Confirmed patient has responsible person to drive home post procedure and observe 24 hours after arriving home: yes  Currently, due to Covid-19 pandemic, only one support person will be allowed with patient. Must be the same support person for that patient's entire stay, will be screened and required to wear a mask. They will be asked to wait in the waiting room for the duration of the patient's stay.  Patients are required to wear a mask when they enter the hospital.      COVID-19 Pre-Screening Questions:  . In the past 7 to 10 days have you had a cough,  shortness of breath, headache, congestion, fever (100 or greater) body aches, chills, sore throat, or sudden loss of taste or sense of smell? No-pt does have a history of sinus congestion . Have you been around anyone with known Covid 19? no . Have you been around anyone who is awaiting Covid 19 test results in the past 7 to 10 days? no . Have you been around anyone who has been exposed to Covid 19, or has mentioned symptoms of Covid 19 within the past 7 to 10 days? no   I reviewed procedure/mask/visitor,Covid-19 screening questions with patient, he verbalized understanding,thanked me for call.

## 2019-04-18 ENCOUNTER — Encounter (HOSPITAL_COMMUNITY)
Admission: RE | Disposition: A | Discharge status: Home / Self Care | Payer: Medicare Other | Source: Home / Self Care | Attending: Interventional Cardiology

## 2019-04-18 ENCOUNTER — Other Ambulatory Visit: Payer: Self-pay

## 2019-04-18 ENCOUNTER — Ambulatory Visit (HOSPITAL_COMMUNITY)
Admission: RE | Admit: 2019-04-18 | Discharge: 2019-04-18 | Disposition: A | Discharge status: Home / Self Care | Payer: Medicare Other | Attending: Interventional Cardiology | Admitting: Interventional Cardiology

## 2019-04-18 DIAGNOSIS — I8393 Asymptomatic varicose veins of bilateral lower extremities: Secondary | ICD-10-CM | POA: Diagnosis not present

## 2019-04-18 DIAGNOSIS — I25119 Atherosclerotic heart disease of native coronary artery with unspecified angina pectoris: Secondary | ICD-10-CM | POA: Diagnosis not present

## 2019-04-18 DIAGNOSIS — Z886 Allergy status to analgesic agent status: Secondary | ICD-10-CM | POA: Insufficient documentation

## 2019-04-18 DIAGNOSIS — E785 Hyperlipidemia, unspecified: Secondary | ICD-10-CM | POA: Diagnosis present

## 2019-04-18 DIAGNOSIS — Z79899 Other long term (current) drug therapy: Secondary | ICD-10-CM | POA: Insufficient documentation

## 2019-04-18 DIAGNOSIS — J45909 Unspecified asthma, uncomplicated: Secondary | ICD-10-CM | POA: Insufficient documentation

## 2019-04-18 DIAGNOSIS — Z7951 Long term (current) use of inhaled steroids: Secondary | ICD-10-CM | POA: Diagnosis not present

## 2019-04-18 DIAGNOSIS — Z8249 Family history of ischemic heart disease and other diseases of the circulatory system: Secondary | ICD-10-CM | POA: Diagnosis not present

## 2019-04-18 DIAGNOSIS — M25551 Pain in right hip: Secondary | ICD-10-CM | POA: Diagnosis not present

## 2019-04-18 DIAGNOSIS — Z880 Allergy status to penicillin: Secondary | ICD-10-CM | POA: Diagnosis not present

## 2019-04-18 DIAGNOSIS — K219 Gastro-esophageal reflux disease without esophagitis: Secondary | ICD-10-CM | POA: Diagnosis not present

## 2019-04-18 DIAGNOSIS — N400 Enlarged prostate without lower urinary tract symptoms: Secondary | ICD-10-CM | POA: Diagnosis not present

## 2019-04-18 DIAGNOSIS — Z882 Allergy status to sulfonamides status: Secondary | ICD-10-CM | POA: Insufficient documentation

## 2019-04-18 DIAGNOSIS — I2510 Atherosclerotic heart disease of native coronary artery without angina pectoris: Secondary | ICD-10-CM

## 2019-04-18 DIAGNOSIS — M25552 Pain in left hip: Secondary | ICD-10-CM | POA: Insufficient documentation

## 2019-04-18 DIAGNOSIS — I10 Essential (primary) hypertension: Secondary | ICD-10-CM | POA: Diagnosis not present

## 2019-04-18 DIAGNOSIS — K5730 Diverticulosis of large intestine without perforation or abscess without bleeding: Secondary | ICD-10-CM | POA: Diagnosis present

## 2019-04-18 DIAGNOSIS — I209 Angina pectoris, unspecified: Secondary | ICD-10-CM | POA: Diagnosis present

## 2019-04-18 DIAGNOSIS — K921 Melena: Secondary | ICD-10-CM | POA: Diagnosis present

## 2019-04-18 HISTORY — PX: LEFT HEART CATH AND CORONARY ANGIOGRAPHY: CATH118249

## 2019-04-18 LAB — CBC
HCT: 53.4 % — ABNORMAL HIGH (ref 39.0–52.0)
Hemoglobin: 17.1 g/dL — ABNORMAL HIGH (ref 13.0–17.0)
MCH: 28.4 pg (ref 26.0–34.0)
MCHC: 32 g/dL (ref 30.0–36.0)
MCV: 88.7 fL (ref 80.0–100.0)
Platelets: 269 10*3/uL (ref 150–400)
RBC: 6.02 MIL/uL — ABNORMAL HIGH (ref 4.22–5.81)
RDW: 14.6 % (ref 11.5–15.5)
WBC: 10.8 10*3/uL — ABNORMAL HIGH (ref 4.0–10.5)
nRBC: 0 % (ref 0.0–0.2)

## 2019-04-18 SURGERY — LEFT HEART CATH AND CORONARY ANGIOGRAPHY
Anesthesia: LOCAL

## 2019-04-18 MED ORDER — HEPARIN (PORCINE) IN NACL 1000-0.9 UT/500ML-% IV SOLN
INTRAVENOUS | Status: DC | PRN
  Administered 2019-04-18 (×2): 500 mL

## 2019-04-18 MED ORDER — LIDOCAINE HCL (PF) 1 % IJ SOLN
INTRAMUSCULAR | Status: DC | PRN
  Administered 2019-04-18: 2 mL

## 2019-04-18 MED ORDER — HEPARIN SODIUM (PORCINE) 1000 UNIT/ML IJ SOLN
INTRAMUSCULAR | Status: DC | PRN
  Administered 2019-04-18: 4000 [IU] via INTRAVENOUS

## 2019-04-18 MED ORDER — HEPARIN (PORCINE) IN NACL 1000-0.9 UT/500ML-% IV SOLN
INTRAVENOUS | Status: AC
  Filled 2019-04-18: qty 1000

## 2019-04-18 MED ORDER — LABETALOL HCL 5 MG/ML IV SOLN: 10.0000 mg | INTRAVENOUS | Status: DC | PRN

## 2019-04-18 MED ORDER — SODIUM CHLORIDE 0.9 % IV SOLN: INTRAVENOUS | Status: DC

## 2019-04-18 MED ORDER — SODIUM CHLORIDE 0.9 % IV SOLN
INTRAVENOUS | Status: DC
  Administered 2019-04-18: 06:00:00 via INTRAVENOUS

## 2019-04-18 MED ORDER — NITROGLYCERIN 1 MG/10 ML FOR IR/CATH LAB
INTRA_ARTERIAL | Status: AC
  Filled 2019-04-18: qty 10

## 2019-04-18 MED ORDER — NITROGLYCERIN 1 MG/10 ML FOR IR/CATH LAB
INTRA_ARTERIAL | Status: DC | PRN
  Administered 2019-04-18: 200 ug via INTRACORONARY

## 2019-04-18 MED ORDER — MIDAZOLAM HCL 2 MG/2ML IJ SOLN
INTRAMUSCULAR | Status: AC
  Filled 2019-04-18: qty 2

## 2019-04-18 MED ORDER — ACETAMINOPHEN 325 MG PO TABS: 650.0000 mg | ORAL_TABLET | ORAL | Status: DC | PRN

## 2019-04-18 MED ORDER — SODIUM CHLORIDE 0.9% FLUSH: 3.0000 mL | Freq: Two times a day (BID) | INTRAVENOUS | Status: DC

## 2019-04-18 MED ORDER — VERAPAMIL HCL 2.5 MG/ML IV SOLN
INTRAVENOUS | Status: DC | PRN
  Administered 2019-04-18: 10 mL via INTRA_ARTERIAL

## 2019-04-18 MED ORDER — ONDANSETRON HCL 4 MG/2ML IJ SOLN: 4.0000 mg | Freq: Four times a day (QID) | INTRAMUSCULAR | Status: DC | PRN

## 2019-04-18 MED ORDER — HYDRALAZINE HCL 20 MG/ML IJ SOLN: 10.0000 mg | INTRAMUSCULAR | Status: DC | PRN

## 2019-04-18 MED ORDER — MIDAZOLAM HCL 2 MG/2ML IJ SOLN
INTRAMUSCULAR | Status: DC | PRN
  Administered 2019-04-18: 1 mg via INTRAVENOUS

## 2019-04-18 MED ORDER — FENTANYL CITRATE (PF) 100 MCG/2ML IJ SOLN
INTRAMUSCULAR | Status: AC
  Filled 2019-04-18: qty 2

## 2019-04-18 MED ORDER — SODIUM CHLORIDE 0.9 % IV SOLN: 250.0000 mL | INTRAVENOUS | Status: DC | PRN

## 2019-04-18 MED ORDER — SODIUM CHLORIDE 0.9% FLUSH: 3.0000 mL | INTRAVENOUS | Status: DC | PRN

## 2019-04-18 MED ORDER — IOHEXOL 350 MG/ML SOLN
INTRAVENOUS | Status: DC | PRN
  Administered 2019-04-18: 85 mL via INTRA_ARTERIAL

## 2019-04-18 MED ORDER — VERAPAMIL HCL 2.5 MG/ML IV SOLN
INTRAVENOUS | Status: AC
  Filled 2019-04-18: qty 2

## 2019-04-18 MED ORDER — LIDOCAINE HCL (PF) 1 % IJ SOLN
INTRAMUSCULAR | Status: AC
  Filled 2019-04-18: qty 30

## 2019-04-18 MED ORDER — HEPARIN SODIUM (PORCINE) 1000 UNIT/ML IJ SOLN
INTRAMUSCULAR | Status: AC
  Filled 2019-04-18: qty 1

## 2019-04-18 MED ORDER — FENTANYL CITRATE (PF) 100 MCG/2ML IJ SOLN
INTRAMUSCULAR | Status: DC | PRN
  Administered 2019-04-18: 25 ug via INTRAVENOUS

## 2019-04-18 SURGICAL SUPPLY — 11 items
CATH INFINITI 5 FR JL3.5 (CATHETERS) ×2 IMPLANT
CATH INFINITI JR4 5F (CATHETERS) ×2 IMPLANT
DEVICE RAD COMP TR BAND LRG (VASCULAR PRODUCTS) ×2 IMPLANT
GLIDESHEATH SLEND A-KIT 6F 22G (SHEATH) ×2 IMPLANT
GUIDEWIRE INQWIRE 1.5J.035X260 (WIRE) ×1 IMPLANT
INQWIRE 1.5J .035X260CM (WIRE) ×2
KIT HEART LEFT (KITS) ×2 IMPLANT
PACK CARDIAC CATHETERIZATION (CUSTOM PROCEDURE TRAY) ×2 IMPLANT
SHEATH PROBE COVER 6X72 (BAG) ×2 IMPLANT
TRANSDUCER W/STOPCOCK (MISCELLANEOUS) ×2 IMPLANT
TUBING CIL FLEX 10 FLL-RA (TUBING) ×2 IMPLANT

## 2019-04-18 NOTE — CV Procedure (Signed)
   Severe multivessel disease involving RCA, circumflex, and moderate left main and LAD disease.  Left heart cath with coronary angiography via right radial approach using real-time vascular ultrasound for access.  Patient is allergic to aspirin.  Plan surgical consultation for multi-vessel revascularization.  Will need Plavix post surgery for graft patency.  Normal LV function.

## 2019-04-18 NOTE — Interval H&P Note (Signed)
Cath Lab Visit (complete for each Cath Lab visit)  Clinical Evaluation Leading to the Procedure:   ACS: No.  Non-ACS:    Anginal Classification: CCS III  Anti-ischemic medical therapy: Minimal Therapy (1 class of medications)  Non-Invasive Test Results: High-risk stress test findings: cardiac mortality >3%/year  Prior CABG: No previous CABG      History and Physical Interval Note:  04/18/2019 7:07 AM  Antonio Carlson  has presented today for surgery, with the diagnosis of CAD.  The various methods of treatment have been discussed with the patient and family. After consideration of risks, benefits and other options for treatment, the patient has consented to  Procedure(s): LEFT HEART CATH AND CORONARY ANGIOGRAPHY (N/A) as a surgical intervention.  The patient's history has been reviewed, patient examined, no change in status, stable for surgery.  I have reviewed the patient's chart and labs.  Questions were answered to the patient's satisfaction.     Belva Crome III

## 2019-04-18 NOTE — Progress Notes (Signed)
TCTS consulted for outpt CABG evaluation. °

## 2019-04-18 NOTE — Discharge Instructions (Signed)
Drink plenty of fluids °Keep right arm at or above heart level  °Radial Site Care ° °This sheet gives you information about how to care for yourself after your procedure. Your health care provider may also give you more specific instructions. If you have problems or questions, contact your health care provider. °What can I expect after the procedure? °After the procedure, it is common to have: °· Bruising and tenderness at the catheter insertion area. °Follow these instructions at home: °Medicines °· Take over-the-counter and prescription medicines only as told by your health care provider. °Insertion site care °· Follow instructions from your health care provider about how to take care of your insertion site. Make sure you: °? Wash your hands with soap and water before you change your bandage (dressing). If soap and water are not available, use hand sanitizer. °? Change your dressing as told by your health care provider. °? Leave stitches (sutures), skin glue, or adhesive strips in place. These skin closures may need to stay in place for 2 weeks or longer. If adhesive strip edges start to loosen and curl up, you may trim the loose edges. Do not remove adhesive strips completely unless your health care provider tells you to do that. °· Check your insertion site every day for signs of infection. Check for: °? Redness, swelling, or pain. °? Fluid or blood. °? Pus or a bad smell. °? Warmth. °· Do not take baths, swim, or use a hot tub until your health care provider approves. °· You may shower 24-48 hours after the procedure, or as directed by your health care provider. °? Remove the dressing and gently wash the site with plain soap and water. °? Pat the area dry with a clean towel. °? Do not rub the site. That could cause bleeding. °· Do not apply powder or lotion to the site. °Activity ° °· For 24 hours after the procedure, or as directed by your health care provider: °? Do not flex or bend the affected arm. °? Do  not push or pull heavy objects with the affected arm. °? Do not drive yourself home from the hospital or clinic. You may drive 24 hours after the procedure unless your health care provider tells you not to. °? Do not operate machinery or power tools. °· Do not lift anything that is heavier than 10 lb (4.5 kg), or the limit that you are told, until your health care provider says that it is safe. °· Ask your health care provider when it is okay to: °? Return to work or school. °? Resume usual physical activities or sports. °? Resume sexual activity. °General instructions °· If the catheter site starts to bleed, raise your arm and put firm pressure on the site. If the bleeding does not stop, get help right away. This is a medical emergency. °· If you went home on the same day as your procedure, a responsible adult should be with you for the first 24 hours after you arrive home. °· Keep all follow-up visits as told by your health care provider. This is important. °Contact a health care provider if: °· You have a fever. °· You have redness, swelling, or yellow drainage around your insertion site. °Get help right away if: °· You have unusual pain at the radial site. °· The catheter insertion area swells very fast. °· The insertion area is bleeding, and the bleeding does not stop when you hold steady pressure on the area. °· Your arm or hand   becomes pale, cool, tingly, or numb. °These symptoms may represent a serious problem that is an emergency. Do not wait to see if the symptoms will go away. Get medical help right away. Call your local emergency services (911 in the [place] Do not drive yourself to the hospital. °Summary °· After the procedure, it is common to have bruising and tenderness at the site. °· Follow instructions from your health care provider about how to take care of your radial site wound. Check the wound every day for signs of infection. °· Do not lift anything that is heavier than 10 lb (4.5 kg), or the  limit that you are told, until your health care provider says that it is safe. °This information is not intended to replace advice given to you by your health care provider. Make sure you discuss any questions you have with your health care provider. °Document Released: 08/15/2010 Document Revised: 08/18/2017 Document Reviewed: 08/18/2017 °Elsevier Patient Education © 2020 [employer]. ° °

## 2019-04-19 ENCOUNTER — Institutional Professional Consult (permissible substitution) (INDEPENDENT_AMBULATORY_CARE_PROVIDER_SITE_OTHER): Payer: Medicare Other | Admitting: Cardiothoracic Surgery

## 2019-04-19 ENCOUNTER — Encounter (HOSPITAL_COMMUNITY): Payer: Self-pay | Admitting: Interventional Cardiology

## 2019-04-19 ENCOUNTER — Other Ambulatory Visit: Payer: Self-pay | Admitting: *Deleted

## 2019-04-19 VITALS — BP 150/80 | HR 62 | Temp 97.3°F | Resp 16 | Ht 68.0 in | Wt 175.0 lb

## 2019-04-19 DIAGNOSIS — I2510 Atherosclerotic heart disease of native coronary artery without angina pectoris: Secondary | ICD-10-CM

## 2019-04-19 DIAGNOSIS — I209 Angina pectoris, unspecified: Secondary | ICD-10-CM

## 2019-04-19 DIAGNOSIS — Z01818 Encounter for other preprocedural examination: Secondary | ICD-10-CM

## 2019-04-20 NOTE — Progress Notes (Signed)
[address]       [place] [address]             [phone]     CARDIOTHORACIC SURGERY CONSULTATION REPORT  Referring Provider is Belva Crome, MD Primary Cardiologist is No primary care provider on file. PCP is [name], MD  Chief Complaint  Patient presents with  . Coronary Artery Disease    eval for CABG with CATH 04/18/19/[facility] 04/07/19    HPI:  73 yo man in [place] until past several months when he began to experience burning, substernal CP with exertion. He underwent coronary CT which suggested severe, diffuse CAD. This was confirmed by recent LHC. Presents now for CABG consultation. Not currently having pain, but sx are relatively predictable when he exerts heavily. PMHx most notable for aspirin allergy and asthma.  Past Medical History:  Diagnosis Date  . Allergy   . Asthma   . Benign prostatic hypertrophy   . Diverticulosis   . GERD (gastroesophageal reflux disease)   . Hyperlipidemia   . Hypertension   . Nephrolithiasis   . PVC's (premature ventricular contractions)   . Syncope     Past Surgical History:  Procedure Laterality Date  . APPENDECTOMY    . LEFT HEART CATH AND CORONARY ANGIOGRAPHY N/A 04/18/2019   Procedure: LEFT HEART CATH AND CORONARY ANGIOGRAPHY;  Surgeon: Belva Crome, MD;  Location: [facility];  Service: Cardiovascular;  Laterality: N/A;  . NASAL SINUS SURGERY  Sept 2012    Family History  Problem Relation Age of Onset  . Heart attack Father     Social History   Socioeconomic History  . Marital status: Married    Spouse name: Not on file  . Number of children: Not on file  . Years of education: Not on file  . Highest education level: Not on file  Occupational History  . Not on file  Social Needs  . Financial resource strain: Not on file  . Food insecurity    Worry: Not on file    Inability: Not on file  . Transportation needs    Medical: Not on file    Non-medical: Not on file   Tobacco Use  . Smoking status: Former Smoker    Packs/day: 2.00    Years: 14.00    Pack years: 28.00    Quit date: 07/27/1969    Years since quitting: 49.7  . Smokeless tobacco: Never Used  Substance and Sexual Activity  . Alcohol use: No  . Drug use: Not on file  . Sexual activity: Not on file  Lifestyle  . Physical activity    Days per week: Not on file    Minutes per session: Not on file  . Stress: Not on file  Relationships  . Social Herbalist on phone: Not on file    Gets together: Not on file    Attends religious service: Not on file    Active member of club or organization: Not on file    Attends meetings of clubs or organizations: Not on file    Relationship status: Not on file  . Intimate partner violence    Fear of current or ex partner: Not on file    Emotionally abused: Not on file    Physically abused: Not on file    Forced sexual activity: Not on file  Other Topics Concern  . Not on file  Social History  Narrative  . Not on file    Current Outpatient Medications  Medication Sig Dispense Refill  . acetaminophen (TYLENOL) 500 MG tablet Take 1,000 mg by mouth every 6 (six) hours as needed for moderate pain.     [name] Kitchen acyclovir (ZOVIRAX) 5 % ointment Apply 1 application topically every 3 (three) hours as needed (cold sores). Apply as directed for cold sores    . albuterol (PROAIR HFA) 108 (90 Base) MCG/ACT inhaler 2 puffs every 4 hours as needed only  if your can't catch your breath (Patient taking differently: Inhale 2 puffs into the lungs every 4 (four) hours as needed for wheezing or shortness of breath. ) 1 Inhaler 11  . budesonide (PULMICORT) 0.25 MG/2ML nebulizer solution USE 1 VIAL IN NEBULIZER TWICE DAILY. 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 ) 120 mL 5  . Calcium Carbonate (CALCIUM 500 PO) Take 500 mg by mouth daily.     . Cetirizine HCl 10 MG CAPS Take 1 capsule (10 mg total) by mouth daily for  10 days. 10 capsule 0  . chlorpheniramine (CHLOR-TRIMETON) 4 MG tablet Take 4 mg by mouth daily.     . fluticasone (FLONASE) 50 MCG/ACT nasal spray USE 1 SPRAY IN EACH NOSTRIL EVERY MORNING  AND AT BEDTIME AS NEEDED (Patient taking differently: Place 1 spray into both nostrils daily as needed for allergies. ) 48 g 1  . formoterol (PERFOROMIST) 20 MCG/2ML nebulizer solution Take 2 mLs (20 mcg total) by nebulization 2 (two) times daily. 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 LORazepam (ATIVAN) 0.5 MG tablet Take 0.5 mg by mouth daily as needed for anxiety.    . metoprolol tartrate (LOPRESSOR) 50 MG tablet Take 1/2 (one-half) tablet by mouth twice daily (Patient taking differently: Take 25 mg by mouth 2 (two) times daily. ) 90 tablet 1  . montelukast (SINGULAIR) 10 MG tablet TAKE 1 TABLET BY MOUTH ONCE DAILY IN THE EVENING (Patient taking differently: Take 10 mg by mouth every evening. ) 90 tablet 1  . Multiple Vitamins-Minerals (CENTRUM) tablet Take 1 tablet by mouth daily.      [name] Kitchen omeprazole (PRILOSEC) 20 MG capsule Take 1 capsule (20 mg total) by mouth daily. 90 capsule 3  . oxymetazoline (AFRIN) 0.05 % nasal spray Place 1 spray into the nose 2 (two) times daily as needed for congestion.     . predniSONE (DELTASONE) 10 MG tablet Take 10 mg by mouth daily as needed (sinus polyp congestion).    . simvastatin (ZOCOR) 40 MG tablet TAKE 1 TABLET EVERY EVENING (Patient taking differently: Take 40 mg by mouth at bedtime. ) 90 tablet 1   No current facility-administered medications for this visit.     Allergies  Allergen Reactions  . Aspirin Shortness Of Breath  . Amoxicillin     GI upset Did it involve swelling of the face/tongue/throat, SOB, or low BP? No Did it involve sudden or severe rash/hives, skin peeling, or any reaction on the inside of your mouth or nose? No Did you need to seek medical attention at a hospital or doctor's office? No When did it last happen?20+ years If all above answers are "NO",  may proceed with cephalosporin use.   . Sulfonamide Derivatives Other (See Comments)    Doesn't remember      Review of Systems:   General:  No change appetite or energy, no weight gain or loss Cardiac: Denies resting SOB, denies palpitations, rrhythmia, or atrial  fibrillation,  Respiratory:  Positive history of asthma,   GI:   Negative  GU:   Negative  Vascular:  Negative  Neuro:   Denies stroke,  TIA's, or seizures,   Musculoskeletal: Negative  Skin:   neg  Psych:   Denies  eyes:   Negative  ENT:   Negative  Hematologic:  Negative  Endocrine:  Negative     Physical Exam:   BP (!) 150/80 (BP Location: Right Arm, Patient Position: Sitting, Cuff Size: Normal)   Pulse 62   Temp (!) 97.3 F (36.3 C)   Resp 16   Ht 5\' 8"  (1.727 m)   Wt 79.4 kg   SpO2 95% Comment: RA  BMI 26.61 kg/m   General:   well-appearing, NAD  HEENT:  Unremarkable  Neck:   no JVD, no bruits, no adenopathy   Chest:   clear to auscultation, symmetrical breath sounds, no wheezes, no rhonchi   CV:   RRR, no detectable murmur   Abdomen:  soft, non-tender, no masses   Extremities:  warm, well-perfused, pulses intact throughout, no LE edema  Rectal/GU  Deferred  Neuro:   Grossly non-focal and symmetrical throughout  Skin:   Clean and dry, no rashes, no breakdown   Diagnostic Tests:  I have personally reviewed the images of his left heart catheterization and agree with their interpretation.   Impression:  73 yo man with diffuse, severe multivessel CAD. He appears a great candidate for surgery. Would like to get updated PFTs given his impressive asthma sx, especially with aspirin administration.    Plan: Pre-op work-up then CABG surgery in next 7-10 days   I spent in excess of 45 minutes during the conduct of this office consultation and >50% of this time involved direct face-to-face encounter with the patient for counseling and/or coordination of their care.          Level 3 Office Consult =  40 minutes         Level 4 Office Consult = 60 minutes         Level 5 Office Consult = 80 minutes  B. Murvin Natal, MD 04/20/2019 7:29 AM

## 2019-04-21 ENCOUNTER — Encounter: Payer: Self-pay | Admitting: *Deleted

## 2019-04-21 ENCOUNTER — Other Ambulatory Visit: Payer: Self-pay | Admitting: *Deleted

## 2019-04-21 DIAGNOSIS — I2510 Atherosclerotic heart disease of native coronary artery without angina pectoris: Secondary | ICD-10-CM

## 2019-04-25 ENCOUNTER — Other Ambulatory Visit: Payer: Self-pay

## 2019-04-25 ENCOUNTER — Encounter (HOSPITAL_COMMUNITY): Payer: Medicare Other

## 2019-04-25 ENCOUNTER — Ambulatory Visit (HOSPITAL_BASED_OUTPATIENT_CLINIC_OR_DEPARTMENT_OTHER): Payer: Medicare Other

## 2019-04-25 ENCOUNTER — Other Ambulatory Visit (HOSPITAL_COMMUNITY)
Admission: RE | Admit: 2019-04-25 | Discharge: 2019-04-25 | Disposition: A | Discharge status: Home / Self Care | Payer: Medicare Other | Source: Ambulatory Visit | Attending: Internal Medicine | Admitting: Internal Medicine

## 2019-04-25 DIAGNOSIS — Z20828 Contact with and (suspected) exposure to other viral communicable diseases: Secondary | ICD-10-CM | POA: Insufficient documentation

## 2019-04-25 DIAGNOSIS — I2510 Atherosclerotic heart disease of native coronary artery without angina pectoris: Secondary | ICD-10-CM | POA: Diagnosis not present

## 2019-04-25 DIAGNOSIS — E785 Hyperlipidemia, unspecified: Secondary | ICD-10-CM | POA: Diagnosis not present

## 2019-04-25 DIAGNOSIS — Z01818 Encounter for other preprocedural examination: Secondary | ICD-10-CM

## 2019-04-25 DIAGNOSIS — I082 Rheumatic disorders of both aortic and tricuspid valves: Secondary | ICD-10-CM | POA: Diagnosis not present

## 2019-04-25 DIAGNOSIS — I10 Essential (primary) hypertension: Secondary | ICD-10-CM | POA: Diagnosis not present

## 2019-04-25 LAB — SARS CORONAVIRUS 2 (TAT 6-24 HRS): SARS Coronavirus 2: NEGATIVE

## 2019-04-26 ENCOUNTER — Telehealth: Payer: Self-pay | Admitting: *Deleted

## 2019-04-26 NOTE — Progress Notes (Signed)
[place], [place] - [id number] [place] [place] [address] Phone: [phone] Fax: [place]), [place] - [address] [place]) [place] [address] Phone: [phone] Fax: [phone]      Your procedure is scheduled on Monday 05/01/2019.  Report to [facility] Main Entrance "A" at 05:30 A.M., and check in at the Admitting office.  Call this number if you have problems the morning of surgery:  [phone]  Call [phone] if you have any questions prior to your surgery date Monday-Friday 8am-4pm    Remember:  Do not eat or drink after midnight the night before your surgery    Take these medicines the morning of surgery with A SIP OF WATER: Acetaminophen (Tylenol) - if needed Acyclovir (Zovirax) - if needed Albuterol (Proair) inhaler - if needed --> Please bring all inhalers with you the day of surgery.  Budesonide (Pulmicort) nebulizer solution Chlorpheniramine (Chlor-trimeton) Famotidine (Pepcid) - if needed Formoterol (perforomist) nebulizer solution Lorazepam (ativan) - if needed Metoprolol tartrate (Lopressor) Omeprazole (Prilosec) Oxymetazoline (Afrin) nasal spray - if needed Prednisone (Deltasone) - if needed   7 days prior to surgery STOP taking any Aspirin (unless otherwise instructed by your surgeon), Aleve, Naproxen, Ibuprofen, Motrin, Advil, Goody's, BC's, all herbal medications, fish oil, and all vitamins.    The Morning of Surgery  Do not wear jewelry.  Do not wear lotions, powders, colognes, or deodorant  Men may shave face and neck.  Do not bring valuables to the hospital.  [facility] is not responsible for any belongings or valuables.  If you are a smoker, DO NOT Smoke 24 hours prior to surgery  If you wear a CPAP at night please bring your mask, tubing, and machine the morning of surgery   Remember that you must have  someone to transport you home after your surgery, and remain with you for 24 hours if you are discharged the same day.   Contacts, eyeglasses, hearing aids, dentures or bridgework may not be worn into surgery.    Leave your suitcase in the car.  After surgery it may be brought to your room.  For patients admitted to the hospital, discharge time will be determined by your treatment team.  Patients discharged the day of surgery will not be allowed to drive home.    Special instructions:   [facility]- Preparing For Surgery  Before surgery, you can play an important role. Because skin is not sterile, your skin needs to be as free of germs as possible. You can reduce the number of germs on your skin by washing with CHG (chlorahexidine gluconate) Soap before surgery.  CHG is an antiseptic cleaner which kills germs and bonds with the skin to continue killing germs even after washing.    Oral Hygiene is also important to reduce your risk of infection.  Remember - BRUSH YOUR TEETH THE MORNING OF SURGERY WITH YOUR REGULAR TOOTHPASTE  Please do not use if you have an allergy to CHG or antibacterial soaps. If your skin becomes reddened/irritated stop using the CHG.  Do not shave (including legs and underarms) for at least 48 hours prior to first CHG shower. It is OK to shave your face.  Please follow these instructions carefully.   1. Shower the NIGHT BEFORE SURGERY and the MORNING OF SURGERY with CHG Soap.   2. If you chose to wash your hair, wash your hair first as usual  with your normal shampoo.  3. After you shampoo, rinse your hair and body thoroughly to remove the shampoo.  4. Use CHG as you would any other liquid soap. You can apply CHG directly to the skin and wash gently with a scrungie or a clean washcloth.   5. Apply the CHG Soap to your body ONLY FROM THE NECK DOWN.  Do not use on open wounds or open sores. Avoid contact with your eyes, ears, mouth and genitals (private parts). Wash  Face and genitals (private parts)  with your normal soap.   6. Wash thoroughly, paying special attention to the area where your surgery will be performed.  7. Thoroughly rinse your body with warm water from the neck down.  8. DO NOT shower/wash with your normal soap after using and rinsing off the CHG Soap.  9. Pat yourself dry with a CLEAN TOWEL.  10. Wear CLEAN PAJAMAS to bed the night before surgery, wear comfortable clothes the morning of surgery  11. Place CLEAN SHEETS on your bed the night of your first shower and DO NOT SLEEP WITH PETS.    Day of Surgery:   Please shower the morning of surgery with the CHG soap Do not apply any deodorants/lotions.  Please wear clean clothes to the hospital/surgery center.   Remember to brush your teeth WITH YOUR REGULAR TOOTHPASTE.   Please read over the following fact sheets that you were given.

## 2019-04-26 NOTE — Telephone Encounter (Signed)
ASTELLAS Research study: Left message about the [place] study. Office number left [phone]) I will follow up tomorrow at the Preop visit.

## 2019-04-27 ENCOUNTER — Other Ambulatory Visit (HOSPITAL_COMMUNITY)
Admission: RE | Admit: 2019-04-27 | Discharge: 2019-04-27 | Disposition: A | Discharge status: Home / Self Care | Payer: Medicare Other | Source: Ambulatory Visit | Attending: Cardiothoracic Surgery | Admitting: Cardiothoracic Surgery

## 2019-04-27 ENCOUNTER — Encounter (HOSPITAL_COMMUNITY)
Admission: RE | Admit: 2019-04-27 | Discharge: 2019-04-27 | Disposition: A | Discharge status: Home / Self Care | Payer: Medicare Other | Source: Ambulatory Visit | Attending: Cardiothoracic Surgery | Admitting: Cardiothoracic Surgery

## 2019-04-27 ENCOUNTER — Encounter: Payer: Self-pay | Admitting: *Deleted

## 2019-04-27 ENCOUNTER — Ambulatory Visit (HOSPITAL_COMMUNITY)
Admission: RE | Admit: 2019-04-27 | Discharge: 2019-04-27 | Disposition: A | Discharge status: Home / Self Care | Payer: Medicare Other | Source: Ambulatory Visit | Attending: Cardiothoracic Surgery | Admitting: Cardiothoracic Surgery

## 2019-04-27 ENCOUNTER — Encounter (HOSPITAL_COMMUNITY): Payer: Self-pay

## 2019-04-27 ENCOUNTER — Other Ambulatory Visit: Payer: Self-pay

## 2019-04-27 ENCOUNTER — Ambulatory Visit (HOSPITAL_BASED_OUTPATIENT_CLINIC_OR_DEPARTMENT_OTHER)
Admission: RE | Admit: 2019-04-27 | Discharge: 2019-04-27 | Disposition: A | Discharge status: Home / Self Care | Payer: Medicare Other | Source: Ambulatory Visit | Attending: Cardiothoracic Surgery | Admitting: Cardiothoracic Surgery

## 2019-04-27 DIAGNOSIS — I6523 Occlusion and stenosis of bilateral carotid arteries: Secondary | ICD-10-CM | POA: Insufficient documentation

## 2019-04-27 DIAGNOSIS — Z20828 Contact with and (suspected) exposure to other viral communicable diseases: Secondary | ICD-10-CM | POA: Insufficient documentation

## 2019-04-27 DIAGNOSIS — Z006 Encounter for examination for normal comparison and control in clinical research program: Secondary | ICD-10-CM

## 2019-04-27 DIAGNOSIS — I2510 Atherosclerotic heart disease of native coronary artery without angina pectoris: Secondary | ICD-10-CM

## 2019-04-27 DIAGNOSIS — J988 Other specified respiratory disorders: Secondary | ICD-10-CM | POA: Insufficient documentation

## 2019-04-27 DIAGNOSIS — R079 Chest pain, unspecified: Secondary | ICD-10-CM | POA: Diagnosis not present

## 2019-04-27 HISTORY — DX: Atherosclerotic heart disease of native coronary artery without angina pectoris: I25.10

## 2019-04-27 HISTORY — DX: Dyspnea, unspecified: R06.00

## 2019-04-27 HISTORY — DX: Angina pectoris, unspecified: I20.9

## 2019-04-27 HISTORY — DX: Personal history of urinary calculi: Z87.442

## 2019-04-27 HISTORY — DX: Unspecified osteoarthritis, unspecified site: M19.90

## 2019-04-27 LAB — BLOOD GAS, ARTERIAL
Acid-Base Excess: 2.6 mmol/L — ABNORMAL HIGH (ref 0.0–2.0)
Bicarbonate: 26.7 mmol/L (ref 20.0–28.0)
Drawn by: 265211
FIO2: 21
O2 Saturation: 96.9 %
Patient temperature: 98.6
pCO2 arterial: 42 mmHg (ref 32.0–48.0)
pH, Arterial: 7.419 (ref 7.350–7.450)
pO2, Arterial: 93 mmHg (ref 83.0–108.0)

## 2019-04-27 LAB — APTT: aPTT: 32 seconds (ref 24–36)

## 2019-04-27 LAB — CBC
HCT: 51.2 % (ref 39.0–52.0)
Hemoglobin: 16.3 g/dL (ref 13.0–17.0)
MCH: 28.3 pg (ref 26.0–34.0)
MCHC: 31.8 g/dL (ref 30.0–36.0)
MCV: 88.9 fL (ref 80.0–100.0)
Platelets: 284 10*3/uL (ref 150–400)
RBC: 5.76 MIL/uL (ref 4.22–5.81)
RDW: 14.4 % (ref 11.5–15.5)
WBC: 8.3 10*3/uL (ref 4.0–10.5)
nRBC: 0 % (ref 0.0–0.2)

## 2019-04-27 LAB — URINALYSIS, ROUTINE W REFLEX MICROSCOPIC
Bilirubin Urine: NEGATIVE
Glucose, UA: NEGATIVE mg/dL
Hgb urine dipstick: NEGATIVE
Ketones, ur: NEGATIVE mg/dL
Nitrite: NEGATIVE
Protein, ur: NEGATIVE mg/dL
Specific Gravity, Urine: 1.017 (ref 1.005–1.030)
pH: 5 (ref 5.0–8.0)

## 2019-04-27 LAB — PULMONARY FUNCTION TEST
DL/VA % pred: 109 %
DL/VA: 4.4 ml/min/mmHg/L
DLCO cor % pred: 98 %
DLCO cor: 23.27 ml/min/mmHg
DLCO unc % pred: 103 %
DLCO unc: 24.49 ml/min/mmHg
FEF 25-75 Post: 1.21 L/sec
FEF 25-75 Pre: 0.84 L/sec
FEF2575-%Change-Post: 44 %
FEF2575-%Pred-Post: 59 %
FEF2575-%Pred-Pre: 41 %
FEV1-%Change-Post: 13 %
FEV1-%Pred-Post: 75 %
FEV1-%Pred-Pre: 66 %
FEV1-Post: 2.11 L
FEV1-Pre: 1.87 L
FEV1FVC-%Change-Post: 2 %
FEV1FVC-%Pred-Pre: 76 %
FEV6-%Change-Post: 10 %
FEV6-%Pred-Post: 93 %
FEV6-%Pred-Pre: 85 %
FEV6-Post: 3.43 L
FEV6-Pre: 3.12 L
FEV6FVC-%Change-Post: 0 %
FEV6FVC-%Pred-Post: 98 %
FEV6FVC-%Pred-Pre: 99 %
FVC-%Change-Post: 10 %
FVC-%Pred-Post: 94 %
FVC-%Pred-Pre: 86 %
FVC-Post: 3.7 L
FVC-Pre: 3.36 L
Post FEV1/FVC ratio: 57 %
Post FEV6/FVC ratio: 93 %
Pre FEV1/FVC ratio: 56 %
Pre FEV6/FVC Ratio: 93 %
RV % pred: 195 %
RV: 4.78 L
TLC % pred: 125 %
TLC: 8.31 L

## 2019-04-27 LAB — HEMOGLOBIN A1C
Hgb A1c MFr Bld: 5.8 % — ABNORMAL HIGH (ref 4.8–5.6)
Mean Plasma Glucose: 119.76 mg/dL

## 2019-04-27 LAB — COMPREHENSIVE METABOLIC PANEL
ALT: 19 U/L (ref 0–44)
AST: 20 U/L (ref 15–41)
Albumin: 3.6 g/dL (ref 3.5–5.0)
Alkaline Phosphatase: 70 U/L (ref 38–126)
Anion gap: 12 (ref 5–15)
BUN: 20 mg/dL (ref 8–23)
CO2: 21 mmol/L — ABNORMAL LOW (ref 22–32)
Calcium: 9.3 mg/dL (ref 8.9–10.3)
Chloride: 105 mmol/L (ref 98–111)
Creatinine, Ser: 0.79 mg/dL (ref 0.61–1.24)
GFR calc Af Amer: >60 mL/min (ref >60)
GFR calc non Af Amer: >60 mL/min (ref >60)
Glucose, Bld: 103 mg/dL — ABNORMAL HIGH (ref 70–99)
Potassium: 4.5 mmol/L (ref 3.5–5.1)
Sodium: 138 mmol/L (ref 135–145)
Total Bilirubin: 1 mg/dL (ref 0.3–1.2)
Total Protein: 6.2 g/dL — ABNORMAL LOW (ref 6.5–8.1)

## 2019-04-27 LAB — ABO/RH: ABO/RH(D): O POS

## 2019-04-27 LAB — SURGICAL PCR SCREEN
MRSA, PCR: NEGATIVE
Staphylococcus aureus: POSITIVE — AB

## 2019-04-27 LAB — PROTIME-INR
INR: 1 (ref 0.8–1.2)
Prothrombin Time: 13.1 seconds (ref 11.4–15.2)

## 2019-04-27 LAB — TYPE AND SCREEN
ABO/RH(D): O POS
Antibody Screen: NEGATIVE

## 2019-04-27 MED ORDER — ALBUTEROL SULFATE (2.5 MG/3ML) 0.083% IN NEBU
2.5000 mg | INHALATION_SOLUTION | Freq: Once | RESPIRATORY_TRACT | Status: AC
  Administered 2019-04-27: 2.5 mg via RESPIRATORY_TRACT

## 2019-04-27 NOTE — Progress Notes (Signed)
PRE CABG and saphenous vein mapping has been completed.   Preliminary results in CV Proc.   Abram Sander 04/27/2019 9:31 AM

## 2019-04-27 NOTE — Progress Notes (Signed)
Left message for Ryan at Dr. Orvan Seen office regarding U/A result.  Also, sent message to Dr. Orvan Seen.

## 2019-04-27 NOTE — Progress Notes (Signed)
PCP:  Christinia Gully Cardiologist:  Dr. Debara Pickett  EKG:  04/27/19 CXR:  04/27/19 ECHO:  04/25/19 Stress Test:  denies Cardiac Cath:  04/18/19  Blood Thinners:  Denies ASA Instructions:  N/A  Covid Testing 04/27/19  Anesthesia Review:  Yes, CABG  Patient denies shortness of breath, fever, cough, and chest pain at PAT appointment.  Patient verbalized understanding of instructions provided today at the PAT appointment.  Patient asked to review instructions at home and day of surgery.

## 2019-04-27 NOTE — Research (Signed)
ASTELLAS RESEARCH STUDY  Subject met inclusion and exclusion criteria.  The informed consent form, study requirements and expectations were reviewed with the subject and questions and concerns were addressed prior to the signing of the consent form.  The subject verbalized understanding of the trail requirements.  The subject agreed to participate in the ASTELLAS trial and signed the informed consent.  The informed consent was obtained prior to performance of any protocol-specific procedures for the subject.  A copy of the signed informed consent was given to the subject and a copy was placed in the subject's medical record.

## 2019-04-27 NOTE — Progress Notes (Signed)
Called in RX for Mupiricin to [facility] on [address].  Called patient and he is aware to pick up med at pharmacy.  Patient states he already has med from previous sx.  Asked him to look for expiration date and if expired to pick up med.

## 2019-04-27 NOTE — Progress Notes (Signed)
[place], [place] - [id number] [place] [place] [address] Phone: [phone] Fax: [place]), [place] - [address] [place] [address] Phone: [phone] Fax: [phone]      Your procedure is scheduled on Monday October 5, 20202.  Report to [facility] Main Entrance "A" at 5:30 A.M., and check in at the Admitting office.  Call this number if you have problems the morning of surgery:  [phone]  Call [phone] if you have any questions prior to your surgery date Monday-Friday 8am-4pm    Remember:  Do not eat or drink after midnight the night before your surgery    Take these medicines the morning of surgery with A SIP OF WATER: budesonide (PULMICORT) formoterol (PERFOROMIST)  metoprolol tartrate (LOPRESSOR) omeprazole (PRILOSEC) acyclovir (ZOVIRAX) - if needed albuterol (PROAIR HFA) chlorpheniramine (CHLOR-TRIMETON  fluticasone (FLONASE) LORazepam (ATIVAN)  oxymetazoline (AFRIN)  As of today, STOP taking any Aspirin (unless otherwise instructed by your surgeon), Aleve, Naproxen, Ibuprofen, Motrin, Advil, Goody's, BC's, all herbal medications, fish oil, and all vitamins.    The Morning of Surgery  Do not wear jewelry Do not wear lotions, powders, colognes, or deodorant    Men may shave face and neck.  Do not bring valuables to the hospital.  [facility] is not responsible for any belongings or valuables.  If you are a smoker, DO NOT Smoke 24 hours prior to surgery IF you wear a CPAP at night please bring your mask, tubing, and machine the morning of surgery   Remember that you must have someone to transport you home after your surgery, and remain with you for 24 hours if you are discharged the same day.   Contacts, glasses, hearing aids, dentures or bridgework may not be worn into surgery.    Leave your suitcase in  the car.  After surgery it may be brought to your room.  For patients admitted to the hospital, discharge time will be determined by your treatment team.  Patients discharged the day of surgery will not be allowed to drive home.    Special instructions:   [place]- Preparing For Surgery  Before surgery, you can play an important role. Because skin is not sterile, your skin needs to be as free of germs as possible. You can reduce the number of germs on your skin by washing with CHG (chlorahexidine gluconate) Soap before surgery.  CHG is an antiseptic cleaner which kills germs and bonds with the skin to continue killing germs even after washing.    Oral Hygiene is also important to reduce your risk of infection.  Remember - BRUSH YOUR TEETH THE MORNING OF SURGERY WITH YOUR REGULAR TOOTHPASTE  Please do not use if you have an allergy to CHG or antibacterial soaps. If your skin becomes reddened/irritated stop using the CHG.  Do not shave (including legs and underarms) for at least 48 hours prior to first CHG shower. It is OK to shave your face.  Please follow these instructions carefully.   1. Shower the NIGHT BEFORE SURGERY and the MORNING OF SURGERY with CHG Soap.   2. If you chose to wash your hair, wash your hair first as usual with your normal shampoo.  3. After you shampoo, rinse your hair and body thoroughly to remove the shampoo.  4. Use CHG as you would any other liquid soap. You can apply CHG directly to the skin  and wash gently with a scrungie or a clean washcloth.   5. Apply the CHG Soap to your body ONLY FROM THE NECK DOWN.  Do not use on open wounds or open sores. Avoid contact with your eyes, ears, mouth and genitals (private parts). Wash Face and genitals (private parts)  with your normal soap.   6. Wash thoroughly, paying special attention to the area where your surgery will be performed.  7. Thoroughly rinse your body with warm water from the neck down.  8. DO NOT  shower/wash with your normal soap after using and rinsing off the CHG Soap.  9. Pat yourself dry with a CLEAN TOWEL.  10. Wear CLEAN PAJAMAS to bed the night before surgery, wear comfortable clothes the morning of surgery  11. Place CLEAN SHEETS on your bed the night of your first shower and DO NOT SLEEP WITH PETS.    Day of Surgery:  Do not apply any deodorants/lotions. Please shower the morning of surgery with the CHG soap  Please wear clean clothes to the hospital/surgery center.   Remember to brush your teeth WITH YOUR REGULAR TOOTHPASTE.   Please read over the following fact sheets that you were given.

## 2019-04-28 ENCOUNTER — Other Ambulatory Visit: Payer: Self-pay

## 2019-04-28 LAB — NOVEL CORONAVIRUS, NAA (HOSP ORDER, SEND-OUT TO REF LAB; TAT 18-24 HRS): SARS-CoV-2, NAA: NOT DETECTED

## 2019-04-28 MED ORDER — POTASSIUM CHLORIDE 2 MEQ/ML IV SOLN
80.0000 meq | INTRAVENOUS | Status: DC
  Filled 2019-04-28 (×2): qty 40

## 2019-04-28 MED ORDER — DOPAMINE-DEXTROSE 3.2-5 MG/ML-% IV SOLN
0.0000 ug/kg/min | INTRAVENOUS | Status: DC
  Filled 2019-04-28 (×2): qty 250

## 2019-04-28 MED ORDER — VANCOMYCIN HCL 1000 MG IV SOLR
INTRAVENOUS | Status: DC
  Filled 2019-04-28 (×2): qty 1000

## 2019-04-28 MED ORDER — SODIUM CHLORIDE 0.9 % IV SOLN
INTRAVENOUS | Status: DC
  Filled 2019-04-28 (×2): qty 30

## 2019-04-28 MED ORDER — MILRINONE LACTATE IN DEXTROSE 20-5 MG/100ML-% IV SOLN
0.3000 ug/kg/min | INTRAVENOUS | Status: DC
  Filled 2019-04-28 (×2): qty 100

## 2019-04-28 MED ORDER — SODIUM CHLORIDE 0.9 % IV SOLN
750.0000 mg | INTRAVENOUS | Status: DC
  Filled 2019-04-28 (×2): qty 750

## 2019-04-28 MED ORDER — MAGNESIUM SULFATE 50 % IJ SOLN
40.0000 meq | INTRAMUSCULAR | Status: DC
  Filled 2019-04-28 (×2): qty 9.85

## 2019-04-28 MED ORDER — CIPROFLOXACIN HCL 500 MG PO TABS: 500.0000 mg | ORAL_TABLET | Freq: Two times a day (BID) | ORAL | 0 refills | Status: DC

## 2019-04-28 MED ORDER — TRANEXAMIC ACID (OHS) PUMP PRIME SOLUTION
2.0000 mg/kg | INTRAVENOUS | Status: DC
  Filled 2019-04-28 (×2): qty 1.61

## 2019-04-28 MED ORDER — SODIUM CHLORIDE 0.9 % IV SOLN
1.5000 g | INTRAVENOUS | Status: AC
  Administered 2019-05-01: .75 g via INTRAVENOUS
  Administered 2019-05-01: 1.5 g via INTRAVENOUS
  Filled 2019-04-28 (×2): qty 1.5

## 2019-04-28 MED ORDER — EPINEPHRINE HCL 5 MG/250ML IV SOLN IN NS
0.0000 ug/min | INTRAVENOUS | Status: DC
  Filled 2019-04-28 (×2): qty 250

## 2019-04-28 MED ORDER — TRANEXAMIC ACID (OHS) BOLUS VIA INFUSION
15.0000 mg/kg | INTRAVENOUS | Status: AC
  Administered 2019-05-01: 1210.5 mg via INTRAVENOUS
  Filled 2019-04-28 (×2): qty 1211

## 2019-04-28 MED ORDER — VANCOMYCIN HCL 10 G IV SOLR
1250.0000 mg | INTRAVENOUS | Status: AC
  Administered 2019-05-01: 1250 mg via INTRAVENOUS
  Filled 2019-04-28 (×2): qty 1250

## 2019-04-28 MED ORDER — NITROGLYCERIN IN D5W 200-5 MCG/ML-% IV SOLN
2.0000 ug/min | INTRAVENOUS | Status: AC
  Administered 2019-05-01: 5 ug/min via INTRAVENOUS
  Filled 2019-04-28 (×2): qty 250

## 2019-04-28 MED ORDER — DEXMEDETOMIDINE HCL IN NACL 400 MCG/100ML IV SOLN
0.1000 ug/kg/h | INTRAVENOUS | Status: AC
  Administered 2019-05-01: .5 ug/kg/h via INTRAVENOUS
  Filled 2019-04-28 (×2): qty 100

## 2019-04-28 MED ORDER — PHENYLEPHRINE HCL-NACL 20-0.9 MG/250ML-% IV SOLN
30.0000 ug/min | INTRAVENOUS | Status: AC
  Administered 2019-05-01: 30 ug/min via INTRAVENOUS
  Filled 2019-04-28 (×2): qty 250

## 2019-04-28 MED ORDER — TRANEXAMIC ACID 1000 MG/10ML IV SOLN
1.5000 mg/kg/h | INTRAVENOUS | Status: AC
  Administered 2019-05-01: 1.5 mg/kg/h via INTRAVENOUS
  Filled 2019-04-28 (×2): qty 25

## 2019-04-28 MED ORDER — PLASMA-LYTE 148 IV SOLN
INTRAVENOUS | Status: DC
  Filled 2019-04-28 (×2): qty 2.5

## 2019-04-28 MED ORDER — INSULIN REGULAR(HUMAN) IN NACL 100-0.9 UT/100ML-% IV SOLN
INTRAVENOUS | Status: AC
  Administered 2019-05-01: 08:00:00 1 [IU]/h via INTRAVENOUS
  Filled 2019-04-28 (×2): qty 100

## 2019-04-28 NOTE — Research (Signed)
[facility] study Visit 1: Reviewed of history and physical by investigator.   Yes No Inclusion  [x]  []  Institutional Review Board (IRB)/Independent Ethics Committee (IEC)-approved written informed consent and privacy language as per national regulations (e.g., [place] for [place] sites) must be obtained from the subject or legally authorized representative prior to any study-related procedures (including withdrawal of prohibited medication).  [x]  []  Subject agrees not to participate in another interventional study after signing the informed consent form (ICF) and until the end of study (EoS) visit has been completed.   [x]  []  Subject is ? 75 years of age at time of screening (visit   [x]  []  Subject undergoing non-emergent open chest cardiovascular surgery with use of CPB (i.e., CABG and/or valve surgery [including aortic root and ascending aorta surgery, without circulatory arrest]) within 4 weeks of screening (visit 1).  [x]  []  Subject has moderate/high risk of developing AKI following surgery: [x]  Only CABG, or single valve surgery: subject requires to have at least 2 AKI risk factors at screening [] Combined CABG and surgery for 1 or more valves: subject requires to have at least 1 AKI risk factor at screening [] Surgery for more than 1 cardiac valve: subject requires to have at least 1 AKI risk factor at screening [] Surgery for aortic root or ascending aorta without circulatory arrest: subject requires to have at least 1 AKI risk factor at screening  AKI risk factors: [x]  Age at screening of ? 70 years []  Documented history of eGFR < 60 mL/min per 1.73 m2 as per Modification of Diet in Renal Disease Study (MDRD) or CKD-EPI equation (or documented measured GFR assessment) [] o History needs to be present for at least 90 days prior to screening. Both SCr and eGFR need to be documented in the chart, and [] o eGFR at screening or  baseline needs to be < 60 mL/min per 1.73 m2, as well as per CKD-EPI equation. []  Documented history of congestive heart failure requiring hospitalization. This condition should exist for at least 90 days prior to screening. []  Documented history of diabetes mellitus (type 1 or 2) of ? 90 days prior to screening, and subject is on active antidiabetic medication treatment for ? 90 days. []  Documented history of proteinuria/albuminuria at any time point before screening [] o Urinary dipstick result of ? 2+, OR [] o Documented urinary albumin creatinine ratio measurement of ? 300 mg/g, or [] o Documented total quantity of protein in a 24-hour urine collection test ? 0.3 g/day.  [x]  []  Subject must have the ability, in the opinion of the investigator, and willingness to return for all scheduled visits and perform all assessments.  [x]  []  A male subject is eligible to participate if she is not pregnant see [Appendix 12.3 Contraception Requirements] and at least 1 of the following conditions applies: [x]  Not a woman of childbearing potential (WOCBP) as defined in [Appendix 12.3 Contraception Requirements] OR []  WOCBP who agrees to follow the contraceptive guidance as defined in [Appendix 12.3 Contraception Requirements] throughout the treatment period and for at least 30 days after the final study drug administration.  []  []  Male subject must agree not to breastfeed starting at screening and throughout the study period, and for 30 days after the final study drug administration.  []  []  Male subject must not donate ova starting at screening and throughout the study period, and for 30 days after the final study drug administration.  [x]  []  A male subject with male partner(s) of childbearing potential  must agree to use contraception as detailed in [Appendix 12.3 Contraception Requirements] during the treatment period and for at least 30 days after the final study drug administration.  [x]  []  A male  subject must not donate sperm during the treatment period and for at least 30 days after the final study drug administration.  [x]  []  Male subject with a pregnant or breastfeeding partner(s) must agree to remain abstinent or use a condom for the duration of the pregnancy or time partner is breastfeeding throughout the study period and for 30 days after the final study drug administration.    Exclusion  []  [x]  Subject has received investigational drug within 30 days or 5 half-lives, whichever is longer, prior to screening.  []  [x]  Subject has use of RRT within 30 days prior to screening.  []  [x]  Subject has a known history of eGFR < 30 mL/min per 1.73 m2 as per CKD-EPI or MDRD equation at any time before screening.  []  [x]  Subject has a prior kidney transplantation.  []  [x]  Subject has a known or suspected glomerulonephritis (other than Diabetic Kidney Disease).  []  [x]  Subject has confirmed or treated endocarditis, or other current active infection requiring antibiotic treatment, within 30 days prior to screening.  []  [x]  Subject is using prohibited medications as specified in the concomitant medication section of the protocol [Section 5.1.3.1 Prohibited and Restricted Treatment].  []  [x]  Subject has a prior history of intravenous drug abuse within 1 year prior to screening.  []  [x]  Subject has a known chronic liver disorder with Child-Pugh B or C classification.  []  [x]  Subject has any of the following abnormal liver or kidney function parameters (as assessed in visit 1 sample. If 1 of results of central or local laboratory testing fulfill the criterion, the subjects will be eligible.): []  Alanine aminotransferase (ALT), aspartate aminotransferase (AST) to > 2 times the upper limit of normal (ULN) or bilirubin increased to > 1.5 times the ULN. []  eGFR < 30 mL/min per 1.73 m2 as per CKD-EPI equation.  []  [x]  Subject has use of left ventricular assist device (LVAD), intra-aortic balloon pump (IABP) or  other cardiac devices, or catecholamines within 7 days prior to screening.  []  [x]  Subject has surgery scheduled to be performed without CPB (i.e., "Off-Pump" surgery).  []  [x]  Subject has surgery scheduled to be performed under conditions of circulatory arrest, including planned deep hypothermic circulatory arrest.  []  [x]  Subject has surgery scheduled for aortic dissection.  []  [x]  Subject has surgery for a condition that is immediately life-threatening as per the discretion of the investigator.  []  [x]  Subject has surgery scheduled to correct major congenital heart defect.   []  [x]  Subject has current or previous malignant disease. Subjects with a history of cancer are considered eligible if the subject has undergone therapy and the subject has been considered disease free or progression free for at least 5 years. Subject with completely excised basal cell carcinoma or squamous cell carcinoma of the skin and completely excised cervical cancer in situ are also considered eligible.    Preoperatively at the Day of Surgery:  [x]  []  Exclusion criteria 1 to 17 are applicable at this time.  []  [x]  Subject has AKI (any stage) present at presurgery baseline at the discretion of the investigator.  []  [x]  Subject has known or suspected infection/sepsis at time of presurgery baseline at the discretion of investigator.    Perioperative Exclusion Criteria:  []  [x]  Subject requires Extracorporeal Membrane Oxygenation (ECMO) during or after completion  of surgery.  []  [x]  Subject requires ventricular assist device (VAD) during or after completion of surgery.  []  [x]  Subject has surgery performed "Off-Pump" at any time during surgery.    General:  []  [x]  Subject has other condition, which, in the investigator's opinion, makes the subject unsuitable for study participation.  []  [x]  Male subject who is pregnant or lactating or has a positive pregnancy test within 72 hours prior to screening and/or randomization,  has been pregnant within 6 months before screening assessment or breastfeeding within 3 months before screening or who is planning to become pregnant within the total study period.  []  [x]  Subject has a known or suspected hypersensitivity to [id number] or any components of the formulation used.  []  [x]  Subject is an employee of the [employer] or the [employer]) involved in the study.   Central Labs Obtained:

## 2019-04-28 NOTE — Progress Notes (Signed)
Patient's lab work came back positive for a UTI.  Per Dr. Orvan Seen, Cipro called into [facility] as patient requested.  Patient advised to take up until day of surgery.

## 2019-05-01 ENCOUNTER — Inpatient Hospital Stay (HOSPITAL_COMMUNITY): Payer: Medicare Other | Admitting: Certified Registered Nurse Anesthetist

## 2019-05-01 ENCOUNTER — Inpatient Hospital Stay (HOSPITAL_COMMUNITY): Payer: Medicare Other | Admitting: Physician Assistant

## 2019-05-01 ENCOUNTER — Encounter (HOSPITAL_COMMUNITY): Payer: Self-pay

## 2019-05-01 ENCOUNTER — Inpatient Hospital Stay (HOSPITAL_COMMUNITY): Payer: Medicare Other

## 2019-05-01 ENCOUNTER — Inpatient Hospital Stay (HOSPITAL_COMMUNITY)
Admission: RE | Disposition: A | Discharge status: Home / Self Care | Payer: Self-pay | Source: Home / Self Care | Attending: Cardiothoracic Surgery

## 2019-05-01 ENCOUNTER — Other Ambulatory Visit: Payer: Self-pay

## 2019-05-01 ENCOUNTER — Inpatient Hospital Stay (HOSPITAL_COMMUNITY)
Admission: RE | Admit: 2019-05-01 | Discharge: 2019-05-09 | DRG: 236 | Disposition: A | Discharge status: Home / Self Care | Payer: Medicare Other | Attending: Cardiothoracic Surgery | Admitting: Cardiothoracic Surgery

## 2019-05-01 DIAGNOSIS — I2510 Atherosclerotic heart disease of native coronary artery without angina pectoris: Secondary | ICD-10-CM

## 2019-05-01 DIAGNOSIS — Z4682 Encounter for fitting and adjustment of non-vascular catheter: Secondary | ICD-10-CM | POA: Diagnosis not present

## 2019-05-01 DIAGNOSIS — Z886 Allergy status to analgesic agent status: Secondary | ICD-10-CM

## 2019-05-01 DIAGNOSIS — Z8249 Family history of ischemic heart disease and other diseases of the circulatory system: Secondary | ICD-10-CM | POA: Diagnosis not present

## 2019-05-01 DIAGNOSIS — E8770 Fluid overload, unspecified: Secondary | ICD-10-CM | POA: Diagnosis present

## 2019-05-01 DIAGNOSIS — I25119 Atherosclerotic heart disease of native coronary artery with unspecified angina pectoris: Secondary | ICD-10-CM | POA: Diagnosis not present

## 2019-05-01 DIAGNOSIS — Z951 Presence of aortocoronary bypass graft: Secondary | ICD-10-CM | POA: Diagnosis not present

## 2019-05-01 DIAGNOSIS — N400 Enlarged prostate without lower urinary tract symptoms: Secondary | ICD-10-CM | POA: Diagnosis present

## 2019-05-01 DIAGNOSIS — J45909 Unspecified asthma, uncomplicated: Secondary | ICD-10-CM | POA: Diagnosis present

## 2019-05-01 DIAGNOSIS — E785 Hyperlipidemia, unspecified: Secondary | ICD-10-CM | POA: Diagnosis present

## 2019-05-01 DIAGNOSIS — J9811 Atelectasis: Secondary | ICD-10-CM | POA: Diagnosis not present

## 2019-05-01 DIAGNOSIS — D62 Acute posthemorrhagic anemia: Secondary | ICD-10-CM | POA: Diagnosis not present

## 2019-05-01 DIAGNOSIS — Z23 Encounter for immunization: Secondary | ICD-10-CM | POA: Diagnosis not present

## 2019-05-01 DIAGNOSIS — Z87442 Personal history of urinary calculi: Secondary | ICD-10-CM | POA: Diagnosis not present

## 2019-05-01 DIAGNOSIS — I480 Paroxysmal atrial fibrillation: Secondary | ICD-10-CM | POA: Diagnosis present

## 2019-05-01 DIAGNOSIS — R000 Tachycardia, unspecified: Secondary | ICD-10-CM | POA: Diagnosis not present

## 2019-05-01 DIAGNOSIS — K5790 Diverticulosis of intestine, part unspecified, without perforation or abscess without bleeding: Secondary | ICD-10-CM | POA: Diagnosis present

## 2019-05-01 DIAGNOSIS — Z20828 Contact with and (suspected) exposure to other viral communicable diseases: Secondary | ICD-10-CM | POA: Diagnosis not present

## 2019-05-01 DIAGNOSIS — K219 Gastro-esophageal reflux disease without esophagitis: Secondary | ICD-10-CM | POA: Diagnosis present

## 2019-05-01 DIAGNOSIS — K5900 Constipation, unspecified: Secondary | ICD-10-CM | POA: Diagnosis not present

## 2019-05-01 DIAGNOSIS — R7303 Prediabetes: Secondary | ICD-10-CM | POA: Diagnosis present

## 2019-05-01 DIAGNOSIS — Z9889 Other specified postprocedural states: Secondary | ICD-10-CM

## 2019-05-01 DIAGNOSIS — I4819 Other persistent atrial fibrillation: Secondary | ICD-10-CM | POA: Diagnosis not present

## 2019-05-01 DIAGNOSIS — Z7901 Long term (current) use of anticoagulants: Secondary | ICD-10-CM | POA: Diagnosis not present

## 2019-05-01 DIAGNOSIS — I493 Ventricular premature depolarization: Secondary | ICD-10-CM | POA: Diagnosis present

## 2019-05-01 DIAGNOSIS — Z79891 Long term (current) use of opiate analgesic: Secondary | ICD-10-CM | POA: Diagnosis not present

## 2019-05-01 DIAGNOSIS — I25118 Atherosclerotic heart disease of native coronary artery with other forms of angina pectoris: Secondary | ICD-10-CM | POA: Diagnosis not present

## 2019-05-01 DIAGNOSIS — M549 Dorsalgia, unspecified: Secondary | ICD-10-CM | POA: Diagnosis not present

## 2019-05-01 DIAGNOSIS — Z7951 Long term (current) use of inhaled steroids: Secondary | ICD-10-CM

## 2019-05-01 DIAGNOSIS — I10 Essential (primary) hypertension: Secondary | ICD-10-CM | POA: Diagnosis not present

## 2019-05-01 DIAGNOSIS — R55 Syncope and collapse: Secondary | ICD-10-CM | POA: Diagnosis not present

## 2019-05-01 DIAGNOSIS — J90 Pleural effusion, not elsewhere classified: Secondary | ICD-10-CM | POA: Diagnosis not present

## 2019-05-01 DIAGNOSIS — Z79899 Other long term (current) drug therapy: Secondary | ICD-10-CM

## 2019-05-01 DIAGNOSIS — I088 Other rheumatic multiple valve diseases: Secondary | ICD-10-CM | POA: Diagnosis not present

## 2019-05-01 HISTORY — PX: CORONARY ARTERY BYPASS GRAFT: SHX141

## 2019-05-01 HISTORY — PX: TEE WITHOUT CARDIOVERSION: SHX5443

## 2019-05-01 HISTORY — PX: RADIAL ARTERY HARVEST: SHX5067

## 2019-05-01 LAB — POCT I-STAT 4, (NA,K, GLUC, HGB,HCT)
Glucose, Bld: 112 mg/dL — ABNORMAL HIGH (ref 70–99)
Glucose, Bld: 132 mg/dL — ABNORMAL HIGH (ref 70–99)
Glucose, Bld: 118 mg/dL — ABNORMAL HIGH (ref 70–99)
Glucose, Bld: 142 mg/dL — ABNORMAL HIGH (ref 70–99)
Glucose, Bld: 132 mg/dL — ABNORMAL HIGH (ref 70–99)
HCT: 43 % (ref 39.0–52.0)
HCT: 41 % (ref 39.0–52.0)
HCT: 32 % — ABNORMAL LOW (ref 39.0–52.0)
HCT: 32 % — ABNORMAL LOW (ref 39.0–52.0)
HCT: 32 % — ABNORMAL LOW (ref 39.0–52.0)
Hemoglobin: 14.6 g/dL (ref 13.0–17.0)
Hemoglobin: 13.9 g/dL (ref 13.0–17.0)
Hemoglobin: 10.9 g/dL — ABNORMAL LOW (ref 13.0–17.0)
Hemoglobin: 10.9 g/dL — ABNORMAL LOW (ref 13.0–17.0)
Hemoglobin: 10.9 g/dL — ABNORMAL LOW (ref 13.0–17.0)
Potassium: 3.9 mmol/L (ref 3.5–5.1)
Potassium: 3.9 mmol/L (ref 3.5–5.1)
Potassium: 3.8 mmol/L (ref 3.5–5.1)
Potassium: 4.5 mmol/L (ref 3.5–5.1)
Potassium: 4.2 mmol/L (ref 3.5–5.1)
Sodium: 138 mmol/L (ref 135–145)
Sodium: 138 mmol/L (ref 135–145)
Sodium: 140 mmol/L (ref 135–145)
Sodium: 138 mmol/L (ref 135–145)
Sodium: 139 mmol/L (ref 135–145)

## 2019-05-01 LAB — POCT I-STAT 7, (LYTES, BLD GAS, ICA,H+H)
Acid-Base Excess: 1 mmol/L (ref 0.0–2.0)
Acid-Base Excess: 1 mmol/L (ref 0.0–2.0)
Bicarbonate: 26.3 mmol/L (ref 20.0–28.0)
Bicarbonate: 26.8 mmol/L (ref 20.0–28.0)
Calcium, Ion: 1.02 mmol/L — ABNORMAL LOW (ref 1.15–1.40)
Calcium, Ion: 1.09 mmol/L — ABNORMAL LOW (ref 1.15–1.40)
HCT: 33 % — ABNORMAL LOW (ref 39.0–52.0)
HCT: 32 % — ABNORMAL LOW (ref 39.0–52.0)
Hemoglobin: 11.2 g/dL — ABNORMAL LOW (ref 13.0–17.0)
Hemoglobin: 10.9 g/dL — ABNORMAL LOW (ref 13.0–17.0)
O2 Saturation: 100 %
O2 Saturation: 100 %
Potassium: 3.8 mmol/L (ref 3.5–5.1)
Potassium: 4.1 mmol/L (ref 3.5–5.1)
Sodium: 141 mmol/L (ref 135–145)
Sodium: 140 mmol/L (ref 135–145)
TCO2: 28 mmol/L (ref 22–32)
TCO2: 28 mmol/L (ref 22–32)
pCO2 arterial: 42.7 mmHg (ref 32.0–48.0)
pCO2 arterial: 46.5 mmHg (ref 32.0–48.0)
pH, Arterial: 7.398 (ref 7.350–7.450)
pH, Arterial: 7.368 (ref 7.350–7.450)
pO2, Arterial: 414 mmHg — ABNORMAL HIGH (ref 83.0–108.0)
pO2, Arterial: 362 mmHg — ABNORMAL HIGH (ref 83.0–108.0)

## 2019-05-01 LAB — ECHO INTRAOPERATIVE TEE
Height: 68 in
Weight: 2844.82 oz

## 2019-05-01 LAB — CBC
HCT: 36.2 % — ABNORMAL LOW (ref 39.0–52.0)
HCT: 36.7 % — ABNORMAL LOW (ref 39.0–52.0)
Hemoglobin: 12.1 g/dL — ABNORMAL LOW (ref 13.0–17.0)
Hemoglobin: 12.4 g/dL — ABNORMAL LOW (ref 13.0–17.0)
MCH: 29.3 pg (ref 26.0–34.0)
MCH: 29.3 pg (ref 26.0–34.0)
MCHC: 33.4 g/dL (ref 30.0–36.0)
MCHC: 33.8 g/dL (ref 30.0–36.0)
MCV: 87.7 fL (ref 80.0–100.0)
MCV: 86.8 fL (ref 80.0–100.0)
Platelets: 175 10*3/uL (ref 150–400)
Platelets: 202 10*3/uL (ref 150–400)
RBC: 4.13 MIL/uL — ABNORMAL LOW (ref 4.22–5.81)
RBC: 4.23 MIL/uL (ref 4.22–5.81)
RDW: 14.1 % (ref 11.5–15.5)
RDW: 14.3 % (ref 11.5–15.5)
WBC: 19 10*3/uL — ABNORMAL HIGH (ref 4.0–10.5)
WBC: 17.3 10*3/uL — ABNORMAL HIGH (ref 4.0–10.5)
nRBC: 0 % (ref 0.0–0.2)
nRBC: 0 % (ref 0.0–0.2)

## 2019-05-01 LAB — GLUCOSE, CAPILLARY
Glucose-Capillary: 127 mg/dL — ABNORMAL HIGH (ref 70–99)
Glucose-Capillary: 127 mg/dL — ABNORMAL HIGH (ref 70–99)
Glucose-Capillary: 141 mg/dL — ABNORMAL HIGH (ref 70–99)
Glucose-Capillary: 116 mg/dL — ABNORMAL HIGH (ref 70–99)
Glucose-Capillary: 128 mg/dL — ABNORMAL HIGH (ref 70–99)
Glucose-Capillary: 77 mg/dL (ref 70–99)
Glucose-Capillary: 113 mg/dL — ABNORMAL HIGH (ref 70–99)
Glucose-Capillary: 133 mg/dL — ABNORMAL HIGH (ref 70–99)
Glucose-Capillary: 90 mg/dL (ref 70–99)

## 2019-05-01 LAB — BASIC METABOLIC PANEL
Anion gap: 9 (ref 5–15)
BUN: 13 mg/dL (ref 8–23)
CO2: 24 mmol/L (ref 22–32)
Calcium: 7.5 mg/dL — ABNORMAL LOW (ref 8.9–10.3)
Chloride: 106 mmol/L (ref 98–111)
Creatinine, Ser: 0.86 mg/dL (ref 0.61–1.24)
GFR calc Af Amer: >60 mL/min (ref >60)
GFR calc non Af Amer: >60 mL/min (ref >60)
Glucose, Bld: 138 mg/dL — ABNORMAL HIGH (ref 70–99)
Potassium: 4.1 mmol/L (ref 3.5–5.1)
Sodium: 139 mmol/L (ref 135–145)

## 2019-05-01 LAB — APTT: aPTT: 37 seconds — ABNORMAL HIGH (ref 24–36)

## 2019-05-01 LAB — HEMOGLOBIN AND HEMATOCRIT, BLOOD
HCT: 31.5 % — ABNORMAL LOW (ref 39.0–52.0)
Hemoglobin: 10.8 g/dL — ABNORMAL LOW (ref 13.0–17.0)

## 2019-05-01 LAB — MAGNESIUM: Magnesium: 2.8 mg/dL — ABNORMAL HIGH (ref 1.7–2.4)

## 2019-05-01 LAB — PROTIME-INR
INR: 1.4 — ABNORMAL HIGH (ref 0.8–1.2)
Prothrombin Time: 16.6 seconds — ABNORMAL HIGH (ref 11.4–15.2)

## 2019-05-01 LAB — PLATELET COUNT: Platelets: 209 10*3/uL (ref 150–400)

## 2019-05-01 SURGERY — CORONARY ARTERY BYPASS GRAFTING (CABG)
Anesthesia: General | Site: Chest | Laterality: Right

## 2019-05-01 MED ORDER — PROTAMINE SULFATE 10 MG/ML IV SOLN
INTRAVENOUS | Status: AC
  Filled 2019-05-01: qty 5

## 2019-05-01 MED ORDER — LACTATED RINGERS IV SOLN
INTRAVENOUS | Status: DC | PRN
  Administered 2019-05-01: 08:00:00 via INTRAVENOUS

## 2019-05-01 MED ORDER — POTASSIUM CHLORIDE 10 MEQ/50ML IV SOLN: 10.0000 meq | INTRAVENOUS | Status: AC

## 2019-05-01 MED ORDER — BISACODYL 10 MG RE SUPP: 10.0000 mg | Freq: Every day | RECTAL | Status: DC

## 2019-05-01 MED ORDER — CHLORHEXIDINE GLUCONATE 0.12 % MT SOLN
15.0000 mL | OROMUCOSAL | Status: AC
  Administered 2019-05-01: 14:00:00 15 mL via OROMUCOSAL

## 2019-05-01 MED ORDER — VANCOMYCIN HCL 1000 MG IV SOLR
INTRAVENOUS | Status: DC | PRN
  Administered 2019-05-01: 1000 mL

## 2019-05-01 MED ORDER — PROTAMINE SULFATE 10 MG/ML IV SOLN
INTRAVENOUS | Status: AC
  Filled 2019-05-01: qty 25

## 2019-05-01 MED ORDER — METOPROLOL TARTRATE 12.5 MG HALF TABLET
12.5000 mg | ORAL_TABLET | Freq: Two times a day (BID) | ORAL | Status: DC
  Administered 2019-05-02 (×2): 12.5 mg via ORAL
  Filled 2019-05-01 (×2): qty 1

## 2019-05-01 MED ORDER — LACTATED RINGERS IV SOLN: INTRAVENOUS | Status: DC

## 2019-05-01 MED ORDER — PHENYLEPHRINE 40 MCG/ML (10ML) SYRINGE FOR IV PUSH (FOR BLOOD PRESSURE SUPPORT)
PREFILLED_SYRINGE | INTRAVENOUS | Status: DC | PRN
  Administered 2019-05-01: 40 ug via INTRAVENOUS

## 2019-05-01 MED ORDER — LACTATED RINGERS IV SOLN
INTRAVENOUS | Status: DC | PRN
  Administered 2019-05-01 (×2): via INTRAVENOUS

## 2019-05-01 MED ORDER — FENTANYL CITRATE (PF) 250 MCG/5ML IJ SOLN
INTRAMUSCULAR | Status: AC
  Filled 2019-05-01: qty 5

## 2019-05-01 MED ORDER — SODIUM CHLORIDE 0.9% FLUSH: 10.0000 mL | INTRAVENOUS | Status: DC | PRN

## 2019-05-01 MED ORDER — INSULIN REGULAR(HUMAN) IN NACL 100-0.9 UT/100ML-% IV SOLN: INTRAVENOUS | Status: DC

## 2019-05-01 MED ORDER — MIDAZOLAM HCL 2 MG/2ML IJ SOLN: 2.0000 mg | INTRAMUSCULAR | Status: DC | PRN

## 2019-05-01 MED ORDER — ACETAMINOPHEN 160 MG/5ML PO SOLN: 650.0000 mg | Freq: Once | ORAL | Status: AC

## 2019-05-01 MED ORDER — METOPROLOL TARTRATE 12.5 MG HALF TABLET: 12.5000 mg | ORAL_TABLET | Freq: Once | ORAL | Status: DC

## 2019-05-01 MED ORDER — SODIUM BICARBONATE 8.4 % IV SOLN
50.0000 meq | Freq: Once | INTRAVENOUS | Status: AC
  Administered 2019-05-01: 17:00:00 50 meq via INTRAVENOUS

## 2019-05-01 MED ORDER — MORPHINE SULFATE (PF) 2 MG/ML IV SOLN
1.0000 mg | INTRAVENOUS | Status: DC | PRN
  Administered 2019-05-01 – 2019-05-02 (×3): 2 mg via INTRAVENOUS
  Filled 2019-05-01 (×3): qty 1

## 2019-05-01 MED ORDER — HEPARIN SODIUM (PORCINE) 1000 UNIT/ML IJ SOLN
INTRAMUSCULAR | Status: DC | PRN
  Administered 2019-05-01: 24 mL via INTRAVENOUS
  Administered 2019-05-01: 5 mL via INTRAVENOUS

## 2019-05-01 MED ORDER — LACTATED RINGERS IV SOLN: 500.0000 mL | Freq: Once | INTRAVENOUS | Status: DC | PRN

## 2019-05-01 MED ORDER — CHLORHEXIDINE GLUCONATE 0.12% ORAL RINSE (MEDLINE KIT): 15.0000 mL | Freq: Two times a day (BID) | OROMUCOSAL | Status: DC

## 2019-05-01 MED ORDER — SODIUM CHLORIDE 0.9 % IV SOLN
INTRAVENOUS | Status: DC | PRN
  Administered 2019-05-01: 13:00:00 via INTRAVENOUS

## 2019-05-01 MED ORDER — FENTANYL CITRATE (PF) 250 MCG/5ML IJ SOLN
INTRAMUSCULAR | Status: DC | PRN
  Administered 2019-05-01: 150 ug via INTRAVENOUS
  Administered 2019-05-01: 50 ug via INTRAVENOUS
  Administered 2019-05-01: 150 ug via INTRAVENOUS
  Administered 2019-05-01: 200 ug via INTRAVENOUS
  Administered 2019-05-01 (×2): 250 ug via INTRAVENOUS
  Administered 2019-05-01: 100 ug via INTRAVENOUS
  Administered 2019-05-01: 150 ug via INTRAVENOUS
  Administered 2019-05-01: 50 ug via INTRAVENOUS

## 2019-05-01 MED ORDER — PLASMA-LYTE 148 IV SOLN
INTRAVENOUS | Status: DC | PRN
  Administered 2019-05-01: 11:00:00 500 mL

## 2019-05-01 MED ORDER — VANCOMYCIN HCL IN DEXTROSE 1-5 GM/200ML-% IV SOLN
1000.0000 mg | Freq: Once | INTRAVENOUS | Status: AC
  Administered 2019-05-01: 22:00:00 1000 mg via INTRAVENOUS
  Filled 2019-05-01: qty 200

## 2019-05-01 MED ORDER — STERILE WATER FOR INJECTION IJ SOLN
INTRAMUSCULAR | Status: AC
  Filled 2019-05-01: qty 10

## 2019-05-01 MED ORDER — SODIUM CHLORIDE 0.9 % IV SOLN: INTRAVENOUS | Status: DC

## 2019-05-01 MED ORDER — SIMVASTATIN 20 MG PO TABS
40.0000 mg | ORAL_TABLET | Freq: Every day | ORAL | Status: DC
  Administered 2019-05-02 – 2019-05-04 (×3): 40 mg via ORAL
  Filled 2019-05-01 (×3): qty 2

## 2019-05-01 MED ORDER — ROCURONIUM BROMIDE 10 MG/ML (PF) SYRINGE
PREFILLED_SYRINGE | INTRAVENOUS | Status: AC
  Filled 2019-05-01: qty 10

## 2019-05-01 MED ORDER — SODIUM CHLORIDE 0.9% FLUSH
3.0000 mL | Freq: Two times a day (BID) | INTRAVENOUS | Status: DC
  Administered 2019-05-02 – 2019-05-03 (×2): 3 mL via INTRAVENOUS

## 2019-05-01 MED ORDER — 0.9 % SODIUM CHLORIDE (POUR BTL) OPTIME
TOPICAL | Status: DC | PRN
  Administered 2019-05-01: 5000 mL

## 2019-05-01 MED ORDER — CHLORHEXIDINE GLUCONATE 0.12 % MT SOLN
OROMUCOSAL | Status: AC
  Administered 2019-05-01: 07:00:00 15 mL via OROMUCOSAL
  Filled 2019-05-01: qty 15

## 2019-05-01 MED ORDER — STERILE WATER FOR INJECTION IJ SOLN
INTRAMUSCULAR | Status: DC | PRN
  Administered 2019-05-01: 11:00:00 1000 mL via SURGICAL_CAVITY
  Administered 2019-05-01: 11:00:00 20 mL via SURGICAL_CAVITY

## 2019-05-01 MED ORDER — CHLORHEXIDINE GLUCONATE 0.12 % MT SOLN
15.0000 mL | Freq: Once | OROMUCOSAL | Status: AC
  Administered 2019-05-01: 07:00:00 15 mL via OROMUCOSAL

## 2019-05-01 MED ORDER — PHENYLEPHRINE HCL-NACL 20-0.9 MG/250ML-% IV SOLN: 0.0000 ug/min | INTRAVENOUS | Status: DC

## 2019-05-01 MED ORDER — POTASSIUM CHLORIDE 10 MEQ/50ML IV SOLN: 10.0000 meq | INTRAVENOUS | Status: DC

## 2019-05-01 MED ORDER — ARTIFICIAL TEARS OPHTHALMIC OINT
TOPICAL_OINTMENT | OPHTHALMIC | Status: AC
  Filled 2019-05-01: qty 3.5

## 2019-05-01 MED ORDER — SODIUM CHLORIDE 0.9% FLUSH: 3.0000 mL | INTRAVENOUS | Status: DC | PRN

## 2019-05-01 MED ORDER — NITROGLYCERIN IN D5W 200-5 MCG/ML-% IV SOLN: 7.0000 ug/min | INTRAVENOUS | Status: DC

## 2019-05-01 MED ORDER — PROPOFOL 10 MG/ML IV BOLUS
INTRAVENOUS | Status: AC
  Filled 2019-05-01: qty 20

## 2019-05-01 MED ORDER — ACETAMINOPHEN 650 MG RE SUPP
650.0000 mg | Freq: Once | RECTAL | Status: AC
  Administered 2019-05-01: 14:00:00 650 mg via RECTAL

## 2019-05-01 MED ORDER — ORAL CARE MOUTH RINSE: 15.0000 mL | OROMUCOSAL | Status: DC

## 2019-05-01 MED ORDER — MONTELUKAST SODIUM 10 MG PO TABS
10.0000 mg | ORAL_TABLET | Freq: Every evening | ORAL | Status: DC
  Administered 2019-05-02 – 2019-05-08 (×7): 10 mg via ORAL
  Filled 2019-05-01 (×7): qty 1

## 2019-05-01 MED ORDER — SODIUM CHLORIDE 0.45 % IV SOLN
INTRAVENOUS | Status: DC | PRN
  Administered 2019-05-01: 14:00:00 via INTRAVENOUS

## 2019-05-01 MED ORDER — SODIUM CHLORIDE 0.9% FLUSH
10.0000 mL | Freq: Two times a day (BID) | INTRAVENOUS | Status: DC
  Administered 2019-05-02 – 2019-05-03 (×2): 10 mL

## 2019-05-01 MED ORDER — ALBUMIN HUMAN 5 % IV SOLN
250.0000 mL | INTRAVENOUS | Status: AC | PRN
  Administered 2019-05-01: 15:00:00 12.5 g via INTRAVENOUS

## 2019-05-01 MED ORDER — CHLORHEXIDINE GLUCONATE 4 % EX LIQD: 30.0000 mL | CUTANEOUS | Status: DC

## 2019-05-01 MED ORDER — ACETAMINOPHEN 160 MG/5ML PO SOLN: 1000.0000 mg | Freq: Four times a day (QID) | ORAL | Status: DC

## 2019-05-01 MED ORDER — DEXMEDETOMIDINE HCL IN NACL 400 MCG/100ML IV SOLN: 0.0000 ug/kg/h | INTRAVENOUS | Status: DC

## 2019-05-01 MED ORDER — PHENYLEPHRINE 40 MCG/ML (10ML) SYRINGE FOR IV PUSH (FOR BLOOD PRESSURE SUPPORT)
PREFILLED_SYRINGE | INTRAVENOUS | Status: AC
  Filled 2019-05-01: qty 10

## 2019-05-01 MED ORDER — LIDOCAINE 2% (20 MG/ML) 5 ML SYRINGE
INTRAMUSCULAR | Status: AC
  Filled 2019-05-01: qty 5

## 2019-05-01 MED ORDER — ISOSORBIDE DINITRATE 5 MG PO TABS
5.0000 mg | ORAL_TABLET | Freq: Two times a day (BID) | ORAL | Status: DC
  Filled 2019-05-01: qty 1

## 2019-05-01 MED ORDER — NON FORMULARY
Status: DC | PRN
  Administered 2019-05-01: 30 mL via SURGICAL_CAVITY

## 2019-05-01 MED ORDER — ROCURONIUM BROMIDE 50 MG/5ML IV SOSY
PREFILLED_SYRINGE | INTRAVENOUS | Status: DC | PRN
  Administered 2019-05-01: 30 mg via INTRAVENOUS
  Administered 2019-05-01: 40 mg via INTRAVENOUS
  Administered 2019-05-01: 60 mg via INTRAVENOUS
  Administered 2019-05-01: 50 mg via INTRAVENOUS

## 2019-05-01 MED ORDER — PROPOFOL 10 MG/ML IV BOLUS
INTRAVENOUS | Status: DC | PRN
  Administered 2019-05-01: 80 mg via INTRAVENOUS

## 2019-05-01 MED ORDER — CHLORHEXIDINE GLUCONATE CLOTH 2 % EX PADS
6.0000 | MEDICATED_PAD | Freq: Every day | CUTANEOUS | Status: DC
  Administered 2019-05-01 – 2019-05-09 (×8): 6 via TOPICAL

## 2019-05-01 MED ORDER — LIDOCAINE 2% (20 MG/ML) 5 ML SYRINGE
INTRAMUSCULAR | Status: DC | PRN
  Administered 2019-05-01: 60 mg via INTRAVENOUS

## 2019-05-01 MED ORDER — MIDAZOLAM HCL (PF) 10 MG/2ML IJ SOLN
INTRAMUSCULAR | Status: AC
  Filled 2019-05-01: qty 2

## 2019-05-01 MED ORDER — DOCUSATE SODIUM 100 MG PO CAPS
200.0000 mg | ORAL_CAPSULE | Freq: Every day | ORAL | Status: DC
  Administered 2019-05-02 – 2019-05-04 (×3): 200 mg via ORAL
  Filled 2019-05-01 (×3): qty 2

## 2019-05-01 MED ORDER — SODIUM CHLORIDE 0.9 % IV SOLN: 250.0000 mL | INTRAVENOUS | Status: DC

## 2019-05-01 MED ORDER — ONDANSETRON HCL 4 MG/2ML IJ SOLN
INTRAMUSCULAR | Status: DC | PRN
  Administered 2019-05-01: 4 mg via INTRAVENOUS

## 2019-05-01 MED ORDER — ONDANSETRON HCL 4 MG/2ML IJ SOLN
4.0000 mg | Freq: Four times a day (QID) | INTRAMUSCULAR | Status: DC | PRN
  Administered 2019-05-03 – 2019-05-08 (×2): 4 mg via INTRAVENOUS
  Filled 2019-05-01 (×2): qty 2

## 2019-05-01 MED ORDER — ARTIFICIAL TEARS OPHTHALMIC OINT
TOPICAL_OINTMENT | OPHTHALMIC | Status: DC | PRN
  Administered 2019-05-01: 1 via OPHTHALMIC

## 2019-05-01 MED ORDER — ARFORMOTEROL TARTRATE 15 MCG/2ML IN NEBU: 15.0000 ug | INHALATION_SOLUTION | Freq: Two times a day (BID) | RESPIRATORY_TRACT | Status: DC

## 2019-05-01 MED ORDER — NOREPINEPHRINE 4 MG/250ML-% IV SOLN
0.0000 ug/min | INTRAVENOUS | Status: DC
  Filled 2019-05-01: qty 250

## 2019-05-01 MED ORDER — MIDAZOLAM HCL 5 MG/5ML IJ SOLN
INTRAMUSCULAR | Status: DC | PRN
  Administered 2019-05-01: 3 mg via INTRAVENOUS
  Administered 2019-05-01: 2 mg via INTRAVENOUS
  Administered 2019-05-01 (×2): 1 mg via INTRAVENOUS

## 2019-05-01 MED ORDER — SODIUM BICARBONATE 8.4 % IV SOLN
50.0000 meq | Freq: Once | INTRAVENOUS | Status: AC
  Administered 2019-05-01: 20:00:00 50 meq via INTRAVENOUS

## 2019-05-01 MED ORDER — METOPROLOL TARTRATE 5 MG/5ML IV SOLN
2.5000 mg | INTRAVENOUS | Status: DC | PRN
  Administered 2019-05-01 – 2019-05-04 (×2): 5 mg via INTRAVENOUS
  Administered 2019-05-04: 17:00:00 2.5 mg via INTRAVENOUS
  Administered 2019-05-05 – 2019-05-08 (×5): 5 mg via INTRAVENOUS
  Filled 2019-05-01 (×7): qty 5

## 2019-05-01 MED ORDER — INSULIN REGULAR BOLUS VIA INFUSION
0.0000 [IU] | Freq: Three times a day (TID) | INTRAVENOUS | Status: DC
  Filled 2019-05-01: qty 10

## 2019-05-01 MED ORDER — ORAL CARE MOUTH RINSE
15.0000 mL | Freq: Two times a day (BID) | OROMUCOSAL | Status: DC
  Administered 2019-05-02 – 2019-05-09 (×11): 15 mL via OROMUCOSAL

## 2019-05-01 MED ORDER — NITROGLYCERIN IN D5W 200-5 MCG/ML-% IV SOLN: 0.0000 ug/min | INTRAVENOUS | Status: DC

## 2019-05-01 MED ORDER — LACTATED RINGERS IV SOLN
INTRAVENOUS | Status: DC | PRN
  Administered 2019-05-01: 07:00:00 via INTRAVENOUS

## 2019-05-01 MED ORDER — ALBUMIN HUMAN 5 % IV SOLN
INTRAVENOUS | Status: DC | PRN
  Administered 2019-05-01 (×2): via INTRAVENOUS

## 2019-05-01 MED ORDER — ARFORMOTEROL TARTRATE 15 MCG/2ML IN NEBU
15.0000 ug | INHALATION_SOLUTION | Freq: Two times a day (BID) | RESPIRATORY_TRACT | Status: DC
  Administered 2019-05-01 – 2019-05-09 (×16): 15 ug via RESPIRATORY_TRACT
  Filled 2019-05-01 (×16): qty 2

## 2019-05-01 MED ORDER — ACETAMINOPHEN 500 MG PO TABS
1000.0000 mg | ORAL_TABLET | Freq: Four times a day (QID) | ORAL | Status: AC
  Administered 2019-05-02 – 2019-05-06 (×16): 1000 mg via ORAL
  Filled 2019-05-01 (×15): qty 2

## 2019-05-01 MED ORDER — PROTAMINE SULFATE 10 MG/ML IV SOLN
INTRAVENOUS | Status: DC | PRN
  Administered 2019-05-01: 90 mg via INTRAVENOUS
  Administered 2019-05-01: 100 mg via INTRAVENOUS
  Administered 2019-05-01: 40 mg via INTRAVENOUS
  Administered 2019-05-01: 60 mg via INTRAVENOUS

## 2019-05-01 MED ORDER — PANTOPRAZOLE SODIUM 40 MG PO TBEC
40.0000 mg | DELAYED_RELEASE_TABLET | Freq: Every day | ORAL | Status: DC
  Administered 2019-05-03 – 2019-05-09 (×7): 40 mg via ORAL
  Filled 2019-05-01 (×7): qty 1

## 2019-05-01 MED ORDER — OXYCODONE HCL 5 MG PO TABS
5.0000 mg | ORAL_TABLET | ORAL | Status: DC | PRN
  Administered 2019-05-02 (×3): 5 mg via ORAL
  Filled 2019-05-01 (×3): qty 1

## 2019-05-01 MED ORDER — HEPARIN SODIUM (PORCINE) 1000 UNIT/ML IJ SOLN
INTRAMUSCULAR | Status: AC
  Filled 2019-05-01: qty 1

## 2019-05-01 MED ORDER — MAGNESIUM SULFATE 4 GM/100ML IV SOLN
4.0000 g | Freq: Once | INTRAVENOUS | Status: AC
  Administered 2019-05-01: 15:00:00 4 g via INTRAVENOUS
  Filled 2019-05-01: qty 100

## 2019-05-01 MED ORDER — TRAMADOL HCL 50 MG PO TABS: 50.0000 mg | ORAL_TABLET | ORAL | Status: DC | PRN

## 2019-05-01 MED ORDER — ONDANSETRON HCL 4 MG/2ML IJ SOLN
INTRAMUSCULAR | Status: AC
  Filled 2019-05-01: qty 2

## 2019-05-01 MED ORDER — SUCCINYLCHOLINE CHLORIDE 200 MG/10ML IV SOSY
PREFILLED_SYRINGE | INTRAVENOUS | Status: AC
  Filled 2019-05-01: qty 10

## 2019-05-01 MED ORDER — EPHEDRINE SULFATE-NACL 50-0.9 MG/10ML-% IV SOSY
PREFILLED_SYRINGE | INTRAVENOUS | Status: DC | PRN
  Administered 2019-05-01: 5 mg via INTRAVENOUS

## 2019-05-01 MED ORDER — BISACODYL 5 MG PO TBEC
10.0000 mg | DELAYED_RELEASE_TABLET | Freq: Every day | ORAL | Status: DC
  Administered 2019-05-02 – 2019-05-04 (×3): 10 mg via ORAL
  Filled 2019-05-01 (×3): qty 2

## 2019-05-01 MED ORDER — METOPROLOL TARTRATE 25 MG/10 ML ORAL SUSPENSION: 12.5000 mg | Freq: Two times a day (BID) | ORAL | Status: DC

## 2019-05-01 MED ORDER — FAMOTIDINE IN NACL 20-0.9 MG/50ML-% IV SOLN
20.0000 mg | Freq: Two times a day (BID) | INTRAVENOUS | Status: AC
  Administered 2019-05-01: 15:00:00 20 mg via INTRAVENOUS

## 2019-05-01 MED ORDER — BUDESONIDE 0.25 MG/2ML IN SUSP
0.2500 mg | Freq: Two times a day (BID) | RESPIRATORY_TRACT | Status: DC
  Administered 2019-05-01 – 2019-05-09 (×16): 0.25 mg via RESPIRATORY_TRACT
  Filled 2019-05-01 (×16): qty 2

## 2019-05-01 MED ORDER — EPHEDRINE 5 MG/ML INJ
INTRAVENOUS | Status: AC
  Filled 2019-05-01: qty 10

## 2019-05-01 SURGICAL SUPPLY — 98 items
ADAPTER CARDIO PERF ANTE/RETRO (ADAPTER) ×4 IMPLANT
BAG DECANTER FOR FLEXI CONT (MISCELLANEOUS) ×4 IMPLANT
BASKET HEART (ORDER IN 25'S) (MISCELLANEOUS) ×1
BASKET HEART (ORDER IN 25S) (MISCELLANEOUS) ×3 IMPLANT
BATTERY MAXDRIVER (MISCELLANEOUS) ×1 IMPLANT
BLADE CLIPPER SURG (BLADE) ×4 IMPLANT
BLADE STERNUM SYSTEM 6 (BLADE) ×4 IMPLANT
BNDG ELASTIC 4X5.8 VLCR STR LF (GAUZE/BANDAGES/DRESSINGS) ×4 IMPLANT
BNDG ELASTIC 6X5.8 VLCR STR LF (GAUZE/BANDAGES/DRESSINGS) ×4 IMPLANT
BNDG GAUZE ELAST 4 BULKY (GAUZE/BANDAGES/DRESSINGS) ×4 IMPLANT
CANISTER SUCT 3000ML PPV (MISCELLANEOUS) ×4 IMPLANT
CATH CPB KIT HENDRICKSON (MISCELLANEOUS) ×4 IMPLANT
CATH ROBINSON RED A/P 18FR (CATHETERS) ×8 IMPLANT
CLIP RETRACTION 3.0MM CORONARY (MISCELLANEOUS) ×4 IMPLANT
CLIP VESOCCLUDE SM WIDE 24/CT (CLIP) ×1 IMPLANT
COVER WAND RF STERILE (DRAPES) ×3 IMPLANT
DERMABOND ADVANCED (GAUZE/BANDAGES/DRESSINGS) ×1
DERMABOND ADVANCED .7 DNX12 (GAUZE/BANDAGES/DRESSINGS) ×3 IMPLANT
DRAIN CHANNEL 28F RND 3/8 FF (WOUND CARE) ×12 IMPLANT
DRAPE CARDIOVASCULAR INCISE (DRAPES) ×1
DRAPE SLUSH/WARMER DISC (DRAPES) ×4 IMPLANT
DRAPE SRG 135X102X78XABS (DRAPES) ×3 IMPLANT
DRSG AQUACEL AG ADV 3.5X10 (GAUZE/BANDAGES/DRESSINGS) ×1 IMPLANT
DRSG AQUACEL AG ADV 3.5X14 (GAUZE/BANDAGES/DRESSINGS) ×4 IMPLANT
ELECT CAUTERY BLADE 6.4 (BLADE) ×4 IMPLANT
ELECT REM PT RETURN 9FT ADLT (ELECTROSURGICAL) ×8
ELECTRODE REM PT RTRN 9FT ADLT (ELECTROSURGICAL) ×6 IMPLANT
FELT TEFLON 1X6 (MISCELLANEOUS) ×7 IMPLANT
GAUZE SPONGE 4X4 12PLY STRL (GAUZE/BANDAGES/DRESSINGS) ×7 IMPLANT
GLOVE BIOGEL M 6.5 STRL (GLOVE) ×5 IMPLANT
GLOVE BIOGEL M STER SZ 6 (GLOVE) ×2 IMPLANT
GLOVE BIOGEL PI IND STRL 6 (GLOVE) IMPLANT
GLOVE BIOGEL PI IND STRL 6.5 (GLOVE) IMPLANT
GLOVE BIOGEL PI IND STRL 7.0 (GLOVE) IMPLANT
GLOVE BIOGEL PI IND STRL 7.5 (GLOVE) IMPLANT
GLOVE BIOGEL PI INDICATOR 6 (GLOVE) ×2
GLOVE BIOGEL PI INDICATOR 6.5 (GLOVE) ×3
GLOVE BIOGEL PI INDICATOR 7.0 (GLOVE) ×2
GLOVE BIOGEL PI INDICATOR 7.5 (GLOVE) ×2
GLOVE NEODERM STRL 7.5 LF PF (GLOVE) ×9 IMPLANT
GLOVE SURG NEODERM 7.5  LF PF (GLOVE) ×3
GOWN STRL REUS W/ TWL LRG LVL3 (GOWN DISPOSABLE) ×12 IMPLANT
GOWN STRL REUS W/TWL LRG LVL3 (GOWN DISPOSABLE) ×10
HEMOSTAT POWDER SURGIFOAM 1G (HEMOSTASIS) ×12 IMPLANT
HEMOSTAT SURGICEL 2X14 (HEMOSTASIS) ×4 IMPLANT
KIT BASIN OR (CUSTOM PROCEDURE TRAY) ×4 IMPLANT
KIT SUCTION CATH 14FR (SUCTIONS) ×4 IMPLANT
KIT TURNOVER KIT B (KITS) ×4 IMPLANT
KIT VASOVIEW HEMOPRO 2 VH 4000 (KITS) ×4 IMPLANT
LEAD PACING MYOCARDI (MISCELLANEOUS) ×4 IMPLANT
MARKER GRAFT CORONARY BYPASS (MISCELLANEOUS) ×12 IMPLANT
NS IRRIG 1000ML POUR BTL (IV SOLUTION) ×20 IMPLANT
PACK E OPEN HEART (SUTURE) ×4 IMPLANT
PACK OPEN HEART (CUSTOM PROCEDURE TRAY) ×4 IMPLANT
PACK SPY-PHI (KITS) ×1 IMPLANT
PAD ARMBOARD 7.5X6 YLW CONV (MISCELLANEOUS) ×8 IMPLANT
PAD ELECT DEFIB RADIOL ZOLL (MISCELLANEOUS) ×4 IMPLANT
PENCIL BUTTON HOLSTER BLD 10FT (ELECTRODE) ×4 IMPLANT
PLATE STERNAL 2.3X208 14H 2-PK (Plate) ×2 IMPLANT
POSITIONER HEAD DONUT 9IN (MISCELLANEOUS) ×4 IMPLANT
POWDER SURGICEL 3.0 GRAM (HEMOSTASIS) ×1 IMPLANT
PUNCH AORTIC ROTATE  4.5MM 8IN (MISCELLANEOUS) ×1 IMPLANT
SCREW LOCKING TI 2.3X11MM (Screw) ×3 IMPLANT
SCREW STERNAL 2.3X17MM (Screw) ×2 IMPLANT
SCREW STERNAL LOCK 2.3MM (Screw) ×3 IMPLANT
SEALANT SURG COSEAL 8ML (VASCULAR PRODUCTS) ×1 IMPLANT
SET CARDIOPLEGIA MPS 5001102 (MISCELLANEOUS) ×1 IMPLANT
SHEARS HARMONIC 9CM CVD (BLADE) ×1 IMPLANT
SUT BONE WAX W31G (SUTURE) ×4 IMPLANT
SUT MNCRL AB 3-0 PS2 18 (SUTURE) ×8 IMPLANT
SUT MNCRL AB 4-0 PS2 18 (SUTURE) ×2 IMPLANT
SUT PDS AB 1 CTX 36 (SUTURE) ×8 IMPLANT
SUT PROLENE 3 0 SH DA (SUTURE) ×4 IMPLANT
SUT PROLENE 5 0 C 1 36 (SUTURE) IMPLANT
SUT PROLENE 6 0 C 1 30 (SUTURE) ×12 IMPLANT
SUT PROLENE 8 0 BV175 6 (SUTURE) ×2 IMPLANT
SUT PROLENE BLUE 7 0 (SUTURE) ×4 IMPLANT
SUT SILK  1 MH (SUTURE) ×1
SUT SILK 1 MH (SUTURE) IMPLANT
SUT SILK 2 0 SH CR/8 (SUTURE) IMPLANT
SUT SILK 3 0 SH CR/8 (SUTURE) IMPLANT
SUT STEEL 6MS V (SUTURE) ×4 IMPLANT
SUT STEEL SZ 6 DBL 3X14 BALL (SUTURE) ×4 IMPLANT
SUT VIC AB 2-0 CT1 27 (SUTURE) ×2
SUT VIC AB 2-0 CT1 TAPERPNT 27 (SUTURE) IMPLANT
SUT VIC AB 2-0 CTX 27 (SUTURE) IMPLANT
SUT VIC AB 3-0 X1 27 (SUTURE) IMPLANT
SYR 10ML LL (SYRINGE) IMPLANT
SYSTEM SAHARA CHEST DRAIN ATS (WOUND CARE) ×4 IMPLANT
TAPE CLOTH SOFT 2X10 (GAUZE/BANDAGES/DRESSINGS) ×1 IMPLANT
TAPE CLOTH SURG 4X10 WHT LF (GAUZE/BANDAGES/DRESSINGS) ×1 IMPLANT
TOWEL GREEN STERILE (TOWEL DISPOSABLE) ×4 IMPLANT
TOWEL GREEN STERILE FF (TOWEL DISPOSABLE) ×4 IMPLANT
TRAY FOLEY SLVR 16FR TEMP STAT (SET/KITS/TRAYS/PACK) ×4 IMPLANT
TUBING LAP HI FLOW INSUFFLATIO (TUBING) ×3 IMPLANT
UNDERPAD 30X30 (UNDERPADS AND DIAPERS) ×4 IMPLANT
WATER STERILE IRR 1000ML POUR (IV SOLUTION) ×8 IMPLANT
WATER STERILE IRR 1000ML UROMA (IV SOLUTION) IMPLANT

## 2019-05-01 NOTE — Anesthesia Procedure Notes (Signed)
Central Venous Catheter Insertion Performed by: Roberts Gaudy, MD, anesthesiologist Start/End10/11/2018 6:50 AM, 05/01/2019 7:00 AM Patient location: Pre-op. Preanesthetic checklist: patient identified, IV checked, site marked, risks and benefits discussed, surgical consent, monitors and equipment checked, pre-op evaluation, timeout performed and anesthesia consent Lidocaine 1% used for infiltration and patient sedated Hand hygiene performed  and maximum sterile barriers used  Catheter size: 9 Fr Sheath introducer Procedure performed using ultrasound guided technique. Ultrasound Notes:anatomy identified, needle tip was noted to be adjacent to the nerve/plexus identified, no ultrasound evidence of intravascular and/or intraneural injection and image(s) printed for medical record Attempts: 1 Following insertion, line sutured and dressing applied. Post procedure assessment: blood return through all ports, free fluid flow and no air  Patient tolerated the procedure well with no immediate complications.

## 2019-05-01 NOTE — Brief Op Note (Signed)
05/01/2019  12:23 PM  PATIENT:  [name]  73 y.o. male  PRE-OPERATIVE DIAGNOSIS:  Coronary Artery Disease  POST-OPERATIVE DIAGNOSIS:  Coronary Artery Disease  PROCEDURE: TRANSESOPHAGEAL ECHOCARDIOGRAM (TEE), MEDIAN STERNOTOMY for  CORONARY ARTERY BYPASS GRAFTING (CABG) x 4 (LIMA to LAD, RIGHT RADIAL ARTERY SEQUENTIALLY to DISTAL OM and RAMUS INTERMEDIATE, RIMA to PDA) INDOCYANINE GREEN FLUORESCENCE IMAGING (ICG) with BILATERAL MAMMARY ARTERY HARVEST and RIGHT RADIAL ARTERY HARVEST  SURGEON:  Surgeon(s) and Role:    [name], MD - Primary  PHYSICIAN ASSISTANT: Lars Pinks PA-C  ANESTHESIA:   general  EBL:  Per perfusion and anesthesia record  DRAINS: Blake drains in the pleural spaces and chest tube in the mediastinal space   COUNTS CORRECT:  YES  DICTATION: .Dragon Dictation  PLAN OF CARE: Admit to inpatient   PATIENT DISPOSITION:  ICU - intubated and hemodynamically stable.   Delay start of Pharmacological VTE agent (>24hrs) due to surgical blood loss or risk of bleeding: yes   BASELINE WEIGHT: 81 kg

## 2019-05-01 NOTE — Op Note (Signed)
CARDIOTHORACIC SURGERY OPERATIVE NOTE  Date of Procedure: 05/01/2019  Preoperative Diagnosis: Severe 3-vessel Coronary Artery Disease  Postoperative Diagnosis: Same  Procedure:    Coronary Artery Bypass Grafting x 4  Left Internal Mammary Artery to Distal Left Anterior Descending Coronary Artery; pedicled RIMA to Posterior Descending Coronary Artery; right radial artery Graft to distal Obtuse Marginal Branch and ramus intermedius branch of of Left Circumflex Coronary Artery as sequenced graft; bilateral IMA harvesting; right open radial artery harvesting; completion graft surveillance with indocyanine green flourescence imaging (SPY); rigid sternal reconstruction with LSS longitudinal plating system  Surgeon: B. Murvin Natal, MD  Assistant: Josie Saunders PA-C  Anesthesia: get  Operative Findings:  good left ventricular systolic function  good quality internal mammary artery conduits  good quality radial artery conduit  good quality target vessels for grafting    BRIEF CLINICAL NOTE AND INDICATIONS FOR SURGERY  73 yo man presented with typical angina in 8/20. Work-up demonstrated severe 3V CAD. Now presenting for CABG   DETAILS OF THE OPERATIVE PROCEDURE  Preparation:  The patient is brought to the operating room on the above mentioned date and central monitoring was established by the anesthesia team including placement of Swan-Ganz catheter and radial arterial line. The patient is placed in the supine position on the operating table.  Intravenous antibiotics are administered. General endotracheal anesthesia is induced uneventfully. A Foley catheter is placed.  Baseline transesophageal echocardiogram was performed.  Findings were notable for normal LV function, mild AI, mild MR.  The patient's chest, abdomen, both groins, right upper extremity, and both lower extremities are prepared and draped in a sterile manner. A time out procedure is performed.   Surgical Approach  and Conduit Harvest:  The right forearm was addressed first, where an incision was made overlying the palpable right radial pulse approximately 2 cm proximal to the wrist. Dissection was carried down with electrocautery to enter the radial sheath. The radial artery was harvested with the harmonic scalpel. Prior to dividing the radial, the ulnar artery was investigated by doppler, and 5,000 units of unfractionated heparin was delivered systemically. The radial was removed and placed in preservative solution. The wound was repaired in layers.   A median sternotomy incision was performed and the left internal mammary artery is dissected from the chest wall and prepared for bypass grafting. The left internal mammary artery is notably good quality conduit. Next, attention was turned to the right chest wall, where the RIMA was harvested in a standard fashion. Prior to dividing the pedicle distally, the remainder of heparin was delivered intravenously. Following systemic heparinization internal mammary arteries were transected distally, and both noted to have excellent flow.   Extracorporeal Cardiopulmonary Bypass and Myocardial Protection:  The pericardium is opened. The ascending aorta is nondiseased in appearance, although there was intramural calcific disease at the root level. The ascending aorta and the right atrium are cannulated for cardiopulmonary bypass.  Adequate heparinization is verified. The entire pre-bypass portion of the operation was notable for stable hemodynamics.  Cardiopulmonary bypass was begun and the surface of the heart is inspected. Distal target vessels are selected for coronary artery bypass grafting. A cardioplegia cannula is placed in the ascending aorta.   The patient is allowed to cool passively to 34C systemic temperature.  The aortic cross clamp is applied and cold blood cardioplegia is delivered initially in an antegrade fashion through the aortic root.  Iced saline slush  is applied for topical hypothermia.  The initial cardioplegic arrest is rapid with  early diastolic arrest.  Repeat doses of cardioplegia are administered intermittently throughout the entire cross clamp portion of the operation through the aortic root and through subsequently placed grafts in order to maintain completely flat electrocardiogram.  Myocardial protection was felt to be excellent.   Coronary Artery Bypass Grafting:     The distal obtuse marginal branch of the left circumflex coronary artery was grafted using the distal aspect of the radial graft in an end-to-side fashion.  At the site of distal anastomosis the target vessel was good quality and measured approximately 2 mm in diameter. Anastomotic patency and runoff was confirmed with indocyanine green fluorescence imaging (SPY). Next, the ramus diagonal branch of the circumflex coronary artery was grafted using a radial artery graft in an side-to-side fashion to create a sequenced graft.  At the site of distal anastomosis the target vessel was good quality and measured approximately 2 mm in diameter. Anastomotic patency and runoff was confirmed with indocyanine green fluorescence imaging (SPY).  The distal left anterior coronary artery was grafted with the left internal mammary artery in an end-to-side fashion.  At the site of distal anastomosis the target vessel was good quality and measured approximately 1.5 mm in diameter. Anastomotic patency and runoff was confirmed with indocyanine green fluorescence imaging (SPY).  The posterior descending branch of the right coronary artery was grafted using the pedicled RIMA in an end-to-side fashion.  At the site of distal anastomosis the target vessel was good quality and measured approximately 1.5 mm in diameter. Anastomotic patency and runoff was confirmed with indocyanine green fluorescence imaging (SPY).  The proximal radial graft anastomoses was placed directly to the ascending aorta prior to  removal of the aortic cross clamp. The aortic cross clamp was removed after a total cross clamp time of 70 minutes.   Procedure Completion:  All proximal and distal coronary anastomoses were inspected for hemostasis and appropriate graft orientation. Epicardial pacing wires are fixed to the right ventricular outflow tract. The patient is rewarmed to 37C temperature. The patient is weaned and disconnected from cardiopulmonary bypass.  The patient's rhythm at separation from bypass was NSR.  The patient was weaned from cardiopulmonary bypass without any inotropic support. Total cardiopulmonary bypass time for the operation was 96 minutes.  Followup transesophageal echocardiogram performed after separation from bypass revealed no changes from the preoperative exam.  The aortic and venous cannula were removed uneventfully. Protamine was administered to reverse the anticoagulation. The mediastinum and pleural space were inspected for hemostasis and irrigated with saline solution. The mediastinum and bilateral pleural space were drained using fluted chest tubes placed through separate stab incisions inferiorly.  The soft tissues anterior to the aorta were reapproximated loosely. The sternum is closed in a rigid reconstruction method with LSS linear, longitudinal sternal plating system followed by double strength sternal wire. The soft tissues anterior to the sternum were closed in multiple layers and the skin is closed with a running subcuticular skin closure. The post-bypass portion of the operation was notable for stable rhythm and hemodynamics.  No blood products were administered during the operation.   Disposition:  The patient tolerated the procedure well and is transported to the surgical intensive care in stable condition. There are no intraoperative complications. All sponge instrument and needle counts are verified correct at completion of the operation.    Jayme Cloud,  MD 05/01/2019 1:22 PM

## 2019-05-01 NOTE — Anesthesia Procedure Notes (Signed)
Arterial Line Insertion Start/End10/11/2018 6:47 AM, 05/01/2019 6:50 AM Performed by: Orlie Dakin, CRNA, CRNA  Patient location: Pre-op. Preanesthetic checklist: patient identified, IV checked, risks and benefits discussed, surgical consent, monitors and equipment checked and pre-op evaluation Lidocaine 1% used for infiltration and patient sedated Left, radial was placed Catheter size: 20 G Hand hygiene performed  and maximum sterile barriers used  Allen's test indicative of satisfactory collateral circulation Attempts: 1 Procedure performed without using ultrasound guided technique. Following insertion, dressing applied and Biopatch. Post procedure assessment: normal  Patient tolerated the procedure well with no immediate complications.

## 2019-05-01 NOTE — Procedures (Signed)
Extubation Procedure Note  Patient Details:   Name: Antonio Carlson DOB: [birth year] MRN: [id number]   Airway Documentation:    Vent end date: 05/01/19 Vent end time: 1736   Evaluation  O2 sats: stable throughout Complications: No apparent complications Patient did tolerate procedure well. Bilateral Breath Sounds: Diminished   Yes  Pt extubated to 4L N/C.  No stridor noted.  RN @ bedside.  NIF -56, VC 740.  Donnetta Hail 05/01/2019, 5:41 PM

## 2019-05-01 NOTE — Anesthesia Procedure Notes (Signed)
Central Venous Catheter Insertion Performed by: Roberts Gaudy, MD, anesthesiologist Start/End10/11/2018 6:50 AM, 05/01/2019 7:00 AM Patient location: Pre-op. Preanesthetic checklist: patient identified, IV checked, site marked, risks and benefits discussed, surgical consent, monitors and equipment checked, pre-op evaluation, timeout performed and anesthesia consent Hand hygiene performed  and maximum sterile barriers used  PA cath was placed.Swan type:thermodilution Procedure performed without using ultrasound guided technique. Ultrasound Notes:anatomy identified, needle tip was noted to be adjacent to the nerve/plexus identified and image(s) printed for medical record Attempts: 1 Following insertion, line sutured, dressing applied and Biopatch. Post procedure assessment: blood return through all ports and free fluid flow  Patient tolerated the procedure well with no immediate complications.

## 2019-05-01 NOTE — Anesthesia Preprocedure Evaluation (Signed)
Anesthesia Evaluation  Patient identified by MRN, date of birth, ID band Patient awake    Reviewed: Allergy & Precautions, NPO status , Patient's Chart, lab work & pertinent test results  Airway Mallampati: II  TM Distance: >3 FB Neck ROM: Full    Dental  (+) Teeth Intact, Dental Advisory Given   Pulmonary former smoker,    breath sounds clear to auscultation       Cardiovascular hypertension,  Rhythm:Regular Rate:Normal     Neuro/Psych    GI/Hepatic   Endo/Other    Renal/GU      Musculoskeletal   Abdominal   Peds  Hematology   Anesthesia Other Findings   Reproductive/Obstetrics                             Anesthesia Physical Anesthesia Plan  ASA: III  Anesthesia Plan: General   Post-op Pain Management:    Induction: Intravenous  PONV Risk Score and Plan: Ondansetron and Dexamethasone  Airway Management Planned: Oral ETT  Additional Equipment: Arterial line, CVP, PA Cath, 3D TEE and Ultrasound Guidance Line Placement  Intra-op Plan:   Post-operative Plan: Post-operative intubation/ventilation  Informed Consent: I have reviewed the patients History and Physical, chart, labs and discussed the procedure including the risks, benefits and alternatives for the proposed anesthesia with the patient or authorized representative who has indicated his/her understanding and acceptance.     Dental advisory given  Plan Discussed with: CRNA and Anesthesiologist  Anesthesia Plan Comments:         Anesthesia Quick Evaluation

## 2019-05-01 NOTE — H&P (Signed)
History and Physical Interval Note:  05/01/2019 7:09 AM  Antonio Carlson  has presented today for surgery, with the diagnosis of CAD.  The various methods of treatment have been discussed with the patient and family. After consideration of risks, benefits and other options for treatment, the patient has consented to  Procedure(s): CORONARY ARTERY BYPASS GRAFTING (CABG) (N/A) TRANSESOPHAGEAL ECHOCARDIOGRAM (TEE) (N/A) INDOCYANINE GREEN FLUORESCENCE IMAGING (ICG) (N/A) as a surgical intervention.  The patient's history has been reviewed, patient examined, no change in status, stable for surgery.  I have reviewed the patient's chart and labs.  Questions were answered to the patient's satisfaction.     Antonio Carlson  TCTS [id number]

## 2019-05-01 NOTE — Anesthesia Procedure Notes (Signed)
Procedure Name: Intubation Date/Time: 05/01/2019 7:53 AM Performed by: Orlie Dakin, CRNA Pre-anesthesia Checklist: Patient identified, Suction available, Emergency Drugs available and Patient being monitored Patient Re-evaluated:Patient Re-evaluated prior to induction Oxygen Delivery Method: Circle system utilized Preoxygenation: Pre-oxygenation with 100% oxygen Induction Type: IV induction Ventilation: Mask ventilation without difficulty and Oral airway inserted - appropriate to patient size Laryngoscope Size: Sabra Heck and 3 Grade View: Grade I Tube type: Oral Tube size: 8.0 mm Number of attempts: 1 Airway Equipment and Method: Stylet Placement Confirmation: ETT inserted through vocal cords under direct vision,  positive ETCO2 and breath sounds checked- equal and bilateral Secured at: 22 cm Tube secured with: Tape Dental Injury: Teeth and Oropharynx as per pre-operative assessment  Comments: Noted upper front teeth, "rough edges" prior to DL.

## 2019-05-01 NOTE — Anesthesia Postprocedure Evaluation (Signed)
Anesthesia Post Note  Patient: Antonio Carlson  Procedure(s) Performed: CORONARY ARTERY BYPASS GRAFTING (CABG) TIMES FOUR USING LEFT AND RIGHT INTERNAL MAMMARY ARTERIES AND RIGHT RADIAL ARTERY WITH STERNAL PLATING (N/A Chest) TRANSESOPHAGEAL ECHOCARDIOGRAM (TEE) (N/A ) INDOCYANINE GREEN FLUORESCENCE IMAGING (ICG) (N/A ) Right Radial Artery Harvest (Right Arm Lower)     Patient location during evaluation: SICU Anesthesia Type: General Level of consciousness: sedated and patient remains intubated per anesthesia plan Pain management: pain level controlled Vital Signs Assessment: post-procedure vital signs reviewed and stable Respiratory status: patient remains intubated per anesthesia plan and patient on ventilator - see flowsheet for VS Cardiovascular status: stable and blood pressure returned to baseline Postop Assessment: no apparent nausea or vomiting Anesthetic complications: no    Last Vitals:  Vitals:   05/01/19 1800 05/01/19 1900  BP: 106/74 98/74  Pulse: 95 96  Resp: (!) 27 (!) 24  Temp: 36.6 C 36.9 C  SpO2: 99% 98%    Last Pain:  Vitals:   05/01/19 1745  TempSrc:   PainSc: 5                  Charmeka [place] COKER

## 2019-05-01 NOTE — Research (Signed)
[place] study Visit 2  [id number]  MOBILITY:    I HAVE NO PROBLEMS WALKING [x]   I HAVE SLIGHT PROBLEMS WALKING []   I HAVE MODERATE PROBLEMS WALKING []   I HAVE SEVERE PROBLEMS WALKING []   I AM UNABLE TO WALK  []     SELF-CARE:   I HAVE NO PROBLEMS WASING OR DRESSING MYSELF  [x]   I HAVE SLIGHT PROBLEMS WASHING OR DRESSING MYSELF  []   I HAVE MODERATE PROBLEMS WASHING OR DRESSING MYSELF []   I HAVE SEVERE PROBLEMS WASHING OR DRESSING MYSELF  []   I HAVE SEVERE PROBLEMS WASHING OR DRESSING MYSELF  []   I AM UNABLE TO WASH OR DRESS MYSELF []     USUAL ACTIVITIES: (E.G. WORK/STUDY/HOUSEWORK/FAMILY OR LEISURE ACTIVITIES.    I HAVE NO PROBLEMS DOING MY USUAL ACTIVITIES [x]   I HAVE SLIGHT PROBLEMS DOING MY USUAL ACTIVITIES []   I HAVE MODERATE PROBLEMS DOING MY USUAL ACTIVIITIES []   I HAVE SEVERE PROBLEMS DOING MY USUAL ACTIVITIES []   I AM UNABLE TO DO MY USUAL ACTIVITIES []     PAIN /DISCOMFORT   I HAVE NO PAIN OR DISCOMFORT [x]   I HAVE SLIGHT PAIN OR DISCOMFORT []   I HAVE MODERATE PAIN OR DISCOMFORT []   I HAVE SEVERE PAIN OR DISCOMFORT []   I HAVE EXTREME PAIN OR DISCOMFORT []     ANXIETY/DEPRESSION   I AM NOT ANXIOUS OR DEPRESSED []   I AM SLIGHTLY ANXIOUS OR DEPRESSED [x]   I AM MODERATELY ANXIOUS OR DREPRESSED []   I AM SEVERELY ANXIOUS OR DEPRESSED []   I AM EXTREMELY ANXIOUS OR DEPRESSED []     SCALE OF 0-100 HOW WOULD YOU RATE TODAY?  0 IS THE WORSE AND 100 IS THE BEST HEALTH YOU CAN IMAGINE: 90   [facility] Obtained:

## 2019-05-01 NOTE — Transfer of Care (Signed)
Immediate Anesthesia Transfer of Care Note  Patient: Antonio Carlson  Procedure(s) Performed: CORONARY ARTERY BYPASS GRAFTING (CABG) TIMES FOUR USING LEFT AND RIGHT INTERNAL MAMMARY ARTERIES AND RIGHT RADIAL ARTERY WITH STERNAL PLATING (N/A Chest) TRANSESOPHAGEAL ECHOCARDIOGRAM (TEE) (N/A ) INDOCYANINE GREEN FLUORESCENCE IMAGING (ICG) (N/A ) Right Radial Artery Harvest (Right Arm Lower)  Patient Location: ICU  Anesthesia Type:General  Level of Consciousness: sedated and Patient remains intubated per anesthesia plan  Airway & Oxygen Therapy: Patient remains intubated per anesthesia plan and Patient placed on Ventilator (see vital sign flow sheet for setting)  Post-op Assessment: Report given to RN and Post -op Vital signs reviewed and stable  Post vital signs: Reviewed and stable  Last Vitals:  Vitals Value Taken Time  BP    Temp    Pulse 81 05/01/19 1400  Resp 16 05/01/19 1400  SpO2 100 % 05/01/19 1400  Vitals shown include unvalidated device data.  Last Pain:  Vitals:   05/01/19 0630  PainSc: 0-No pain         Complications: No apparent anesthesia complications

## 2019-05-01 NOTE — Progress Notes (Signed)
CT surgery p.m. Rounds  Stable after multivessel CABG with bilateral IMA Extubated saturation 100% Neuro intact Stable hemodynamics

## 2019-05-01 NOTE — Discharge Summary (Signed)
Physician Discharge Summary       [address]       ,[place] [address]             [phone]    Patient ID: Antonio Carlson MRN: [id number] DOB/AGE: 1945/12/02 73 y.o.  Admit date: 05/01/2019 Discharge date: 05/09/2019  Admission Diagnoses: Coronary artery disease  Discharge Diagnoses:  1. S/P CABG x 4 2. Expected ABL anemia 3. Paroxysmal atrial fibrillation 4. History of hypertension 5. History of hyperlipidemia 6. History of BPH 7. History of (gastroesophageal reflux disease) 8. History of nephrolithiasis 9. History of PVCs (premature ventricular contractions) 10. History of syncope 11. History of diverticulosis  Consults: cardiology  Procedure (s):   Coronary Artery Bypass Grafting x 4             Left Internal Mammary Artery to Distal Left Anterior Descending Coronary Artery; pedicled RIMA to Posterior Descending Coronary Artery; right radial artery Graft to distal Obtuse Marginal Branch and ramus intermedius branch of of Left Circumflex Coronary Artery as sequenced graft; bilateral IMA harvesting; right open radial artery harvesting; completion graft surveillance with indocyanine green flourescence imaging (SPY); rigid sternal reconstruction with LSS longitudinal plating system By Dr. Orvan Seen on 05/01/2019.  History of Presenting Illness This is a 73 y.o. male who had follow-up of coronary artery CT scan.    It was ordered for burning chest discomfort he got with exertion that was relieved at rest, however he also had some symptoms at rest and some atypical symptoms as well.  He does have multiple cardiovascular risk factors and strong family history of early heart disease in his father.  The results of his coronary artery CT scan were significantly abnormal.  Calcium score was significantly elevated 1189.  Overall findings support multivessel coronary disease.  Dr. Debara Pickett discussed those findings with him and recommended a cardiac catheterization  primarily for confirmation, as he suspected he would likely need bypass surgery. Patient then underwent a cardiac catheterization on 04/18/2019. He was found to have severe multivessel disease involving the LAD, circumflex, and RCA. PFTs were done and carotid duplex US. There was no significant internal carotid artery stenosis bilaterally.  Brief Hospital Course:  The patient was extubated early the evening of surgery without difficulty. He remained afebrile and hemodynamically stable. Gordy Councilman, a line, and foley were removed early in the post operative course. Chest tubes remained for a few days then were removed.  Lopressor was started and titrated accordingly. He was volume over loaded and diuresed. He had ABL anemia. He did not require a post op transfusion. Last H and H was 11.5 and 35.  He has an allergy to aspirin so he was started on Plavix. He was weaned off the insulin drip.  The patient's HGA1C pre op was 5.8. He likely has pre diabetes. He will be provided with nutrition recommendations in discharge paperwork. The patient was felt surgically stable for transfer from the ICU to [facility] for further convalescence on 10/07 . He went into a fib with RVR early on 10/07. He was given IV Lopressor and put on an Amiodarone drip. He later converted to sinus rhythm. He was then put on oral Amiodarone. On tele, he had PAF with increased HR at times. Lopressor was increased to 37.5 mg bid on 10/10. He was also put on Apixaban. His a fib was not well controlled so Cardiology was consulted. Patient was given a bolus of Amiodarone. He converted to sinus rhythm.He continues to maintain  sinus rhythm with HR in the 80's.  He continues to progress with cardiac rehab. He was ambulating on room air. He has been tolerating a diet but has not had much appetite. We will be decreasing Amiodarone at diacharge. He has had a bowel movement and had diarrhea the evening of 10/10 and morning of 10/11. He was given Imodium. Diarrhea  resolved. Epicardial pacing wires have been previously removed. Chest tube sutures will be removed in the office after discharge. Per cardiology, the patient is felt surgically stable for discharge today.   Latest Vital Signs: Blood pressure 137/81, pulse 92, temperature 97.7 F (36.5 C), temperature source Axillary, resp. rate (!) 23, height 5\' 8"  (1.727 m), weight 79.8 kg, SpO2 98 %.  Physical Exam: Cardiovascular: RRR Pulmonary: Slightly diminished at bases Abdomen: Soft, non tender, bowel sounds present. Extremities: Trace bilateral lower extremity edema. Varicosities LEs Wounds:  Wounds are clean and dry.  No erythema or signs of infection. Motor/sensory intact RUE  Discharge Condition: Stable and discharged to home.  Recent laboratory studies:  Lab Results  Component Value Date   WBC 15.4 (H) 05/03/2019   HGB 11.5 (L) 05/03/2019   HCT 35.0 (L) 05/03/2019   MCV 89.1 05/03/2019   PLT 163 05/03/2019   Lab Results  Component Value Date   NA 140 05/07/2019   K 3.9 05/07/2019   CL 101 05/07/2019   CO2 27 05/07/2019   CREATININE 0.94 05/07/2019   GLUCOSE 91 05/07/2019     Diagnostic Studies: Dg Chest 1 View  Result Date: 05/03/2019 CLINICAL DATA:  Chest tube placement.  Status post cardiac surgery. EXAM: CHEST  1 VIEW COMPARISON:  Radiographs of May 02, 2019. FINDINGS: Stable cardiomegaly. Left-sided chest tube is noted without pneumothorax. Swan-Ganz catheter has been removed. Minimal bibasilar subsegmental atelectasis is noted. Bony thorax is unremarkable. IMPRESSION: Minimal bibasilar subsegmental atelectasis is noted. Stable left-sided chest tube without pneumothorax. Electronically Signed   By: Marijo Conception M.D.   On: 05/03/2019 08:51   Dg Chest 2 View  Result Date: 05/05/2019 CLINICAL DATA:  Pleural effusion. Recent CABG. EXAM: CHEST - 2 VIEW COMPARISON:  05/03/2019 FINDINGS: Sequelae of CABG are again identified. A right jugular sheath remains in place. The  cardiomediastinal silhouette is unchanged. Lung volumes are increased compared to the prior portable study. There are small bilateral pleural effusions with associated mild left greater than right basilar lung opacities likely reflecting atelectasis. No pneumothorax is identified. No acute osseous abnormality is seen. IMPRESSION: Small bilateral pleural effusions and basilar atelectasis. Electronically Signed   By: Logan Bores M.D.   On: 05/05/2019 10:07   Dg Chest 2 View  Result Date: 04/27/2019 CLINICAL DATA:  Coronary artery disease, chest pain with exertion, preoperative assessment for CABG EXAM: CHEST - 2 VIEW COMPARISON:  01/22/2017 FINDINGS: Normal heart size, mediastinal contours, and pulmonary vascularity. Atherosclerotic calcification aorta. BILATERAL nipple shadows unchanged. No infiltrate, pleural effusion or pneumothorax. Degenerative disc disease changes lower thoracic spine. IMPRESSION: No acute abnormalities. Electronically Signed   By: Lavonia Dana M.D.   On: 04/27/2019 13:35   Dg Chest Port 1 View  Result Date: 05/02/2019 CLINICAL DATA:  CABG yesterday. EXAM: PORTABLE CHEST 1 VIEW COMPARISON:  One-view chest x-ray 05/01/2019 FINDINGS: The heart is exaggerated by low lung volumes. The patient has been extubated. Swan-Ganz catheter remains in place. The tip is in the main pulmonary outflow tract. NG tube was removed. Bilateral chest tubes and mediastinal drain are in place. There is no significant  pneumothorax. Pulmonary vascular congestion is noted. No focal airspace consolidation is present. IMPRESSION: 1. Interval extubation. 2. Support apparatus is otherwise stable. 3. Low lung volumes and moderate pulmonary vascular congestion following CABG. Electronically Signed   By: San Morelle M.D.   On: 05/02/2019 08:36   Dg Chest Port 1 View  Result Date: 05/01/2019 CLINICAL DATA:  Status post CABG today. EXAM: PORTABLE CHEST 1 VIEW COMPARISON:  PA and lateral chest 04/27/2019.  FINDINGS: Endotracheal tube is in place with the tip in good position at the level of the clavicular heads. NG tube tip is in the fundus of the stomach. Right IJ approach Swan-Ganz catheter tip is in the distal pulmonary outflow tract. Left chest tube and mediastinal drain noted. Heart size is normal. Atherosclerosis noted. No pneumothorax. Scattered atelectasis is seen throughout the right lung. No acute or focal bony abnormality. IMPRESSION: Support tubes and lines project in good position.  No pneumothorax. Scattered atelectasis in the right lung. Electronically Signed   By: Inge Rise M.D.   On: 05/01/2019 14:30   Vas [place] Lower Ext Saphenous Vein Mapping  Result Date: 04/27/2019 LOWER EXTREMITY VEIN MAPPING Other Indications: pre cabg Risk Factors:      Hypertension, hyperlipidemia.  Comparison Study: no prior Performing Technologist: Abram Sander RVS  Examination Guidelines: A complete evaluation includes B-mode imaging, spectral Doppler, color Doppler, and power Doppler as needed of all accessible portions of each vessel. Bilateral testing is considered an integral part of a complete examination. Limited examinations for reoccurring indications may be performed as noted. +----------------+-----------+--------------+----------------+-----------+  RT Diameter (cm) RT Findings      GSV       LT Diameter (cm) LT Findings  +----------------+-----------+--------------+----------------+-----------+        0.82                   Proximal thigh       0.50                    +----------------+-----------+--------------+----------------+-----------+        0.65                     Mid thigh          0.53                    +----------------+-----------+--------------+----------------+-----------+        0.58                    Distal thigh        0.43                    +----------------+-----------+--------------+----------------+-----------+        0.53                        Knee            0.45                     +----------------+-----------+--------------+----------------+-----------+        0.60        branching    Prox calf          0.32        branching   +----------------+-----------+--------------+----------------+-----------+        0.24        branching     Mid calf  0.29        branching   +----------------+-----------+--------------+----------------+-----------+        0.33        branching   Distal calf         0.37                    +----------------+-----------+--------------+----------------+-----------+ Diagnosing physician: Servando Snare MD Electronically signed by Servando Snare MD on 04/27/2019 at 2:15:23 PM.    Final    Vas US Doppler Pre Cabg  Result Date: 04/27/2019 PREOPERATIVE VASCULAR EVALUATION  Indications:      Pre cabg. Risk Factors:     Hypertension, hyperlipidemia. Comparison Study: no prior Performing Technologist: Abram Sander RVS  Examination Guidelines: A complete evaluation includes B-mode imaging, spectral Doppler, color Doppler, and power Doppler as needed of all accessible portions of each vessel. Bilateral testing is considered an integral part of a complete examination. Limited examinations for reoccurring indications may be performed as noted.  Right Carotid Findings: +----------+--------+--------+--------+------------+--------+             PSV cm/s EDV cm/s Stenosis Describe     Comments  +----------+--------+--------+--------+------------+--------+  CCA Prox   104      12                heterogenous           +----------+--------+--------+--------+------------+--------+  CCA Distal 88       13                heterogenous           +----------+--------+--------+--------+------------+--------+  ICA Prox   75       19       1-39%    heterogenous           +----------+--------+--------+--------+------------+--------+  ICA Distal 98       32                                       +----------+--------+--------+--------+------------+--------+  ECA        85       10                                        +----------+--------+--------+--------+------------+--------+ Portions of this table do not appear on this page. +----------+--------+-------+--------+------------+             PSV cm/s EDV cms Describe Arm Pressure  +----------+--------+-------+--------+------------+  Subclavian 117                                     +----------+--------+-------+--------+------------+ +---------+--------+--+--------+--+---------+  Vertebral PSV cm/s 38 EDV cm/s 12 Antegrade  +---------+--------+--+--------+--+---------+ Left Carotid Findings: +----------+--------+--------+--------+------------+--------+             PSV cm/s EDV cm/s Stenosis Describe     Comments  +----------+--------+--------+--------+------------+--------+  CCA Prox   96       19                heterogenous           +----------+--------+--------+--------+------------+--------+  CCA Distal 84       15                heterogenous           +----------+--------+--------+--------+------------+--------+  ICA Prox   77       26       1-39%    heterogenous           +----------+--------+--------+--------+------------+--------+  ICA Distal 67       18                                       +----------+--------+--------+--------+------------+--------+  ECA        93       12                                       +----------+--------+--------+--------+------------+--------+ +----------+--------+--------+--------+------------+  Subclavian PSV cm/s EDV cm/s Describe Arm Pressure  +----------+--------+--------+--------+------------+             136                                      +----------+--------+--------+--------+------------+ +---------+--------+--+--------+--+---------+  Vertebral PSV cm/s 44 EDV cm/s 13 Antegrade  +---------+--------+--+--------+--+---------+  Right Doppler Findings: +------+--------+-----+---------+--------+  Site   Pressure Index Doppler   Comments  +------+--------+-----+---------+--------+  Radial                 triphasic           +------+--------+-----+---------+--------+  Ulnar                 triphasic           +------+--------+-----+---------+--------+  Left Doppler Findings: +------+--------+-----+---------+--------+  Site   Pressure Index Doppler   Comments  +------+--------+-----+---------+--------+  Radial                triphasic           +------+--------+-----+---------+--------+  Ulnar                 triphasic           +------+--------+-----+---------+--------+  Summary: Right Carotid: Velocities in the right ICA are consistent with a 1-39% stenosis. Left Carotid: Velocities in the left ICA are consistent with a 1-39% stenosis. Vertebrals: Bilateral vertebral arteries demonstrate antegrade flow. Right ABI: Normal ABI done 03/20/19. Left ABI: Normal ABI done 03/20/19. Right Upper Extremity: Doppler waveforms remain within normal limits with right radial compression. Doppler waveforms decrease 50% with right ulnar compression. Left Upper Extremity: Doppler waveforms remain within normal limits with left radial compression. Doppler waveforms remain within normal limits with left ulnar compression.  Electronically signed by Servando Snare MD on 04/27/2019 at 2:15:17 PM.    Final        Discharge Instructions    Amb Referral to Cardiac Rehabilitation   Complete by: As directed    [email or web address]   Diagnosis: CABG   CABG X ___: 4   After initial evaluation and assessments completed: Virtual Based Care may be provided alone or in conjunction with Phase 2 Cardiac Rehab based on patient barriers.: Yes      Discharge Medications: Allergies as of 05/09/2019      Reactions   Aspirin Shortness Of Breath   Amoxicillin    GI upset Did it involve swelling of the face/tongue/throat, SOB, or low BP? No Did it involve sudden or severe rash/hives, skin peeling, or any reaction on the inside of your  mouth or nose? No Did you need to seek medical attention at a hospital or doctor's office? No When did  it last happen?20+ years If all above answers are "NO", may proceed with cephalosporin use.   Sulfonamide Derivatives Other (See Comments)   Doesn't remember      Medication List    STOP taking these medications   ciprofloxacin 500 MG tablet Commonly known as: Cipro   predniSONE 10 MG tablet Commonly known as: DELTASONE   simvastatin 40 MG tablet Commonly known as: ZOCOR     TAKE these medications   acetaminophen 500 MG tablet Commonly known as: TYLENOL Take 2 tablets (1,000 mg total) by mouth every 6 (six) hours as needed for moderate pain.   acyclovir ointment 5 % Commonly known as: ZOVIRAX Apply 1 application topically every 3 (three) hours as needed (cold sores). Apply as directed for cold sores   albuterol 108 (90 Base) MCG/ACT inhaler Commonly known as: ProAir HFA 2 puffs every 4 hours as needed only  if your can't catch your breath What changed:   how much to take  how to take this  when to take this  reasons to take this  additional instructions   amiodarone 200 MG tablet Commonly known as: PACERONE Take 1 tablet (200 mg total) by mouth 2 (two) times daily. For one week;then take Amiodarone 200 mg daily thereafter   apixaban 5 MG Tabs tablet Commonly known as: ELIQUIS Take 1 tablet (5 mg total) by mouth 2 (two) times daily.   atorvastatin 40 MG tablet Commonly known as: LIPITOR Take 1 tablet (40 mg total) by mouth daily at 6 PM.   budesonide 0.25 MG/2ML nebulizer solution Commonly known as: PULMICORT USE 1 VIAL IN NEBULIZER TWICE DAILY What changed: additional instructions   calcium carbonate 1250 (500 Ca) MG chewable tablet Commonly known as: OS-CAL Chew 1 tablet by mouth daily.   Centrum tablet Take 1 tablet by mouth daily.   chlorpheniramine 4 MG tablet Commonly known as: CHLOR-TRIMETON Take 4 mg by mouth 2 (two) times daily as needed for allergies.   clopidogrel 75 MG tablet Commonly known as: PLAVIX Take 1 tablet (75 mg  total) by mouth daily.   famotidine 20 MG tablet Commonly known as: PEPCID Take 20 mg by mouth daily as needed for heartburn or indigestion.   fluticasone 50 MCG/ACT nasal spray Commonly known as: FLONASE USE 1 SPRAY IN EACH NOSTRIL EVERY MORNING  AND AT BEDTIME AS NEEDED What changed:   how much to take  how to take this  when to take this  reasons to take this  additional instructions   furosemide 40 MG tablet Commonly known as: LASIX Take 1 tablet (40 mg total) by mouth daily. For 4 days then stop.   guaiFENesin 600 MG 12 hr tablet Commonly known as: MUCINEX Take 1 tablet (600 mg total) by mouth 2 (two) times daily as needed for cough or to loosen phlegm.   isosorbide dinitrate 10 MG tablet Commonly known as: ISORDIL Take 1 tablet (10 mg total) by mouth 3 (three) times daily.   lisinopril 10 MG tablet Commonly known as: ZESTRIL Take 1 tablet (10 mg total) by mouth daily.   LORazepam 0.5 MG tablet Commonly known as: ATIVAN Take 0.5 mg by mouth daily as needed for anxiety.   metoprolol tartrate 50 MG tablet Commonly known as: LOPRESSOR Take 1 tablet (50 mg total) by mouth 2 (two) times daily. What changed:   how much to take  how to take this  when to take this  additional instructions   montelukast 10 MG tablet Commonly known as: SINGULAIR TAKE 1 TABLET BY MOUTH ONCE DAILY IN THE EVENING   omeprazole 20 MG capsule Commonly known as: PRILOSEC Take 1 capsule (20 mg total) by mouth daily.   oxymetazoline 0.05 % nasal spray Commonly known as: AFRIN Place 1 spray into the nose 2 (two) times daily as needed for congestion.   Perforomist 20 MCG/2ML nebulizer solution Generic drug: formoterol Take 2 mLs (20 mcg total) by nebulization 2 (two) times daily. DX: J45.909   potassium chloride SA 20 MEQ tablet Commonly known as: KLOR-CON Take 1 tablet (20 mEq total) by mouth daily.   traMADol 50 MG tablet Commonly known as: ULTRAM Take 1 tablet (50 mg  total) by mouth every 6 (six) hours as needed for moderate pain.      The patient has been discharged on:   1.Beta Blocker:  Yes [  x ]                              No   [   ]                              If No, reason:  2.Ace Inhibitor/ARB: Yes [  x ]                                     No  [    ]                                     If No, reason:  3.Statin:   Yes [ x  ]                  No  [   ]                  If No, reason:  4.Ecasa:  Yes  [ x  ]                  No   [   ]                  If No, reason:  Follow Up Appointments: Follow-up Information    [name], MD. Go on 05/12/2019.   Specialty: Cardiothoracic Surgery Why: Appointment time is at 2:00 pm Contact information: [address] [place] [address] [phone]        Ledora Bottcher, [place]. Go on 05/25/2019.   Specialties: Physician Assistant, Cardiology, Radiology Why: Appointment time is at 8:45 am Contact information: [address] [place] [address] [phone]        Medical Doctor. Call.   Why: for a follow up appointment regarding further surveillance of HGA1C 5.8 (pre diabetes)          Signed: Sharalyn Ink ZimmermanPA-C 05/09/2019, 12:24 PM

## 2019-05-01 NOTE — Research (Addendum)
[facility] research post urine obtained @ 1525 for nephrocheck. Results: 0.14 Urine sample # 2 obtained @ 1625 for nephrocheck. Results: 0.08 Patient will be in the OBSERVATIONAL Arm of the Research study

## 2019-05-02 ENCOUNTER — Other Ambulatory Visit: Payer: Self-pay | Admitting: Internal Medicine

## 2019-05-02 ENCOUNTER — Encounter (HOSPITAL_COMMUNITY): Payer: Self-pay | Admitting: Cardiothoracic Surgery

## 2019-05-02 ENCOUNTER — Inpatient Hospital Stay (HOSPITAL_COMMUNITY): Payer: Medicare Other

## 2019-05-02 LAB — CBC
HCT: 38.1 % — ABNORMAL LOW (ref 39.0–52.0)
HCT: 35.9 % — ABNORMAL LOW (ref 39.0–52.0)
Hemoglobin: 12.3 g/dL — ABNORMAL LOW (ref 13.0–17.0)
Hemoglobin: 12 g/dL — ABNORMAL LOW (ref 13.0–17.0)
MCH: 28.3 pg (ref 26.0–34.0)
MCH: 29.3 pg (ref 26.0–34.0)
MCHC: 32.3 g/dL (ref 30.0–36.0)
MCHC: 33.4 g/dL (ref 30.0–36.0)
MCV: 87.8 fL (ref 80.0–100.0)
MCV: 87.8 fL (ref 80.0–100.0)
Platelets: 188 10*3/uL (ref 150–400)
Platelets: 168 10*3/uL (ref 150–400)
RBC: 4.34 MIL/uL (ref 4.22–5.81)
RBC: 4.09 MIL/uL — ABNORMAL LOW (ref 4.22–5.81)
RDW: 14.5 % (ref 11.5–15.5)
RDW: 14.9 % (ref 11.5–15.5)
WBC: 15.8 10*3/uL — ABNORMAL HIGH (ref 4.0–10.5)
WBC: 16.9 10*3/uL — ABNORMAL HIGH (ref 4.0–10.5)
nRBC: 0 % (ref 0.0–0.2)
nRBC: 0 % (ref 0.0–0.2)

## 2019-05-02 LAB — BASIC METABOLIC PANEL
Anion gap: 9 (ref 5–15)
Anion gap: 8 (ref 5–15)
BUN: 14 mg/dL (ref 8–23)
BUN: 18 mg/dL (ref 8–23)
CO2: 24 mmol/L (ref 22–32)
CO2: 26 mmol/L (ref 22–32)
Calcium: 7.9 mg/dL — ABNORMAL LOW (ref 8.9–10.3)
Calcium: 8.2 mg/dL — ABNORMAL LOW (ref 8.9–10.3)
Chloride: 106 mmol/L (ref 98–111)
Chloride: 101 mmol/L (ref 98–111)
Creatinine, Ser: 0.97 mg/dL (ref 0.61–1.24)
Creatinine, Ser: 1.07 mg/dL (ref 0.61–1.24)
GFR calc Af Amer: >60 mL/min (ref >60)
GFR calc Af Amer: >60 mL/min (ref >60)
GFR calc non Af Amer: >60 mL/min (ref >60)
GFR calc non Af Amer: >60 mL/min (ref >60)
Glucose, Bld: 117 mg/dL — ABNORMAL HIGH (ref 70–99)
Glucose, Bld: 139 mg/dL — ABNORMAL HIGH (ref 70–99)
Potassium: 4.1 mmol/L (ref 3.5–5.1)
Potassium: 4 mmol/L (ref 3.5–5.1)
Sodium: 139 mmol/L (ref 135–145)
Sodium: 135 mmol/L (ref 135–145)

## 2019-05-02 LAB — MAGNESIUM
Magnesium: 2.5 mg/dL — ABNORMAL HIGH (ref 1.7–2.4)
Magnesium: 2.4 mg/dL (ref 1.7–2.4)

## 2019-05-02 LAB — POCT I-STAT 7, (LYTES, BLD GAS, ICA,H+H)
Acid-base deficit: 2 mmol/L (ref 0.0–2.0)
Acid-base deficit: 5 mmol/L — ABNORMAL HIGH (ref 0.0–2.0)
Acid-base deficit: 5 mmol/L — ABNORMAL HIGH (ref 0.0–2.0)
Acid-base deficit: 1 mmol/L (ref 0.0–2.0)
Bicarbonate: 24.8 mmol/L (ref 20.0–28.0)
Bicarbonate: 21.5 mmol/L (ref 20.0–28.0)
Bicarbonate: 21.2 mmol/L (ref 20.0–28.0)
Bicarbonate: 24.1 mmol/L (ref 20.0–28.0)
Calcium, Ion: 1.13 mmol/L — ABNORMAL LOW (ref 1.15–1.40)
Calcium, Ion: 1.09 mmol/L — ABNORMAL LOW (ref 1.15–1.40)
Calcium, Ion: 1.08 mmol/L — ABNORMAL LOW (ref 1.15–1.40)
Calcium, Ion: 1.1 mmol/L — ABNORMAL LOW (ref 1.15–1.40)
HCT: 36 % — ABNORMAL LOW (ref 39.0–52.0)
HCT: 35 % — ABNORMAL LOW (ref 39.0–52.0)
HCT: 33 % — ABNORMAL LOW (ref 39.0–52.0)
HCT: 36 % — ABNORMAL LOW (ref 39.0–52.0)
Hemoglobin: 12.2 g/dL — ABNORMAL LOW (ref 13.0–17.0)
Hemoglobin: 11.9 g/dL — ABNORMAL LOW (ref 13.0–17.0)
Hemoglobin: 11.2 g/dL — ABNORMAL LOW (ref 13.0–17.0)
Hemoglobin: 12.2 g/dL — ABNORMAL LOW (ref 13.0–17.0)
O2 Saturation: 98 %
O2 Saturation: 96 %
O2 Saturation: 98 %
O2 Saturation: 97 %
Patient temperature: 35.5
Patient temperature: 36.4
Patient temperature: 36.8
Patient temperature: 97.4
Potassium: 4.3 mmol/L (ref 3.5–5.1)
Potassium: 4 mmol/L (ref 3.5–5.1)
Potassium: 3.6 mmol/L (ref 3.5–5.1)
Potassium: 4 mmol/L (ref 3.5–5.1)
Sodium: 141 mmol/L (ref 135–145)
Sodium: 141 mmol/L (ref 135–145)
Sodium: 142 mmol/L (ref 135–145)
Sodium: 140 mmol/L (ref 135–145)
TCO2: 26 mmol/L (ref 22–32)
TCO2: 23 mmol/L (ref 22–32)
TCO2: 22 mmol/L (ref 22–32)
TCO2: 25 mmol/L (ref 22–32)
pCO2 arterial: 45.5 mmHg (ref 32.0–48.0)
pCO2 arterial: 44.4 mmHg (ref 32.0–48.0)
pCO2 arterial: 43.2 mmHg (ref 32.0–48.0)
pCO2 arterial: 39.1 mmHg (ref 32.0–48.0)
pH, Arterial: 7.337 — ABNORMAL LOW (ref 7.350–7.450)
pH, Arterial: 7.29 — ABNORMAL LOW (ref 7.350–7.450)
pH, Arterial: 7.297 — ABNORMAL LOW (ref 7.350–7.450)
pH, Arterial: 7.394 (ref 7.350–7.450)
pO2, Arterial: 108 mmHg (ref 83.0–108.0)
pO2, Arterial: 92 mmHg (ref 83.0–108.0)
pO2, Arterial: 117 mmHg — ABNORMAL HIGH (ref 83.0–108.0)
pO2, Arterial: 87 mmHg (ref 83.0–108.0)

## 2019-05-02 LAB — GLUCOSE, CAPILLARY
Glucose-Capillary: 112 mg/dL — ABNORMAL HIGH (ref 70–99)
Glucose-Capillary: 115 mg/dL — ABNORMAL HIGH (ref 70–99)
Glucose-Capillary: 121 mg/dL — ABNORMAL HIGH (ref 70–99)
Glucose-Capillary: 122 mg/dL — ABNORMAL HIGH (ref 70–99)
Glucose-Capillary: 123 mg/dL — ABNORMAL HIGH (ref 70–99)
Glucose-Capillary: 128 mg/dL — ABNORMAL HIGH (ref 70–99)
Glucose-Capillary: 131 mg/dL — ABNORMAL HIGH (ref 70–99)
Glucose-Capillary: 44 mg/dL — CL (ref 70–99)
Glucose-Capillary: 79 mg/dL (ref 70–99)
Glucose-Capillary: 90 mg/dL (ref 70–99)
Glucose-Capillary: 93 mg/dL (ref 70–99)
Glucose-Capillary: 97 mg/dL (ref 70–99)

## 2019-05-02 MED ORDER — CLOPIDOGREL BISULFATE 75 MG PO TABS
75.0000 mg | ORAL_TABLET | Freq: Every day | ORAL | Status: DC
  Administered 2019-05-02 – 2019-05-09 (×8): 75 mg via ORAL
  Filled 2019-05-02 (×8): qty 1

## 2019-05-02 MED ORDER — ISOSORBIDE DINITRATE 10 MG PO TABS
5.0000 mg | ORAL_TABLET | Freq: Three times a day (TID) | ORAL | Status: DC
  Administered 2019-05-02 (×3): 5 mg via ORAL
  Filled 2019-05-02 (×3): qty 1

## 2019-05-02 MED ORDER — COLCHICINE 0.6 MG PO TABS
0.6000 mg | ORAL_TABLET | Freq: Every day | ORAL | Status: DC
  Administered 2019-05-02 – 2019-05-08 (×7): 0.6 mg via ORAL
  Filled 2019-05-02 (×7): qty 1

## 2019-05-02 NOTE — Research (Signed)
[place] [facility] study  Visit 3 Observational urine specimen collected.

## 2019-05-02 NOTE — Progress Notes (Signed)
1 Day Post-Op Procedure(s) (LRB): CORONARY ARTERY BYPASS GRAFTING (CABG) TIMES FOUR USING LEFT AND RIGHT INTERNAL MAMMARY ARTERIES AND RIGHT RADIAL ARTERY WITH STERNAL PLATING (N/A) TRANSESOPHAGEAL ECHOCARDIOGRAM (TEE) (N/A) INDOCYANINE GREEN FLUORESCENCE IMAGING (ICG) (N/A) Right Radial Artery Harvest (Right) Subjective: No complaints  Objective: Vital signs in last 24 hours: Temp:  [95.9 F (35.5 C)-99.5 F (37.5 C)] 99.5 F (37.5 C) (10/06 0700) Pulse Rate:  [82-103] 103 (10/06 0700) Cardiac Rhythm: Sinus tachycardia (10/06 0400) Resp:  [12-30] 20 (10/06 0700) BP: (98-142)/(74-103) 104/75 (10/06 0700) SpO2:  [91 %-100 %] 96 % (10/06 0700) Arterial Line BP: (116-165)/(30-87) 153/30 (10/06 0700) FiO2 (%):  [40 %-50 %] 40 % (10/05 1641) Weight:  [84 kg] 84 kg (10/06 0500)  Hemodynamic parameters for last 24 hours: PAP: (19-49)/(6-28) 30/14 CO:  [3.1 L/min-6.3 L/min] 5.4 L/min CI:  [1.6 L/min/m2-3.3 L/min/m2] 2.8 L/min/m2  Intake/Output from previous day: 10/05 0701 - 10/06 0700 In: 4803.7 [I.V.:3328.9; Blood:382; IV Piggyback:1092.8] Out: 2610 [id number]; Blood:750; Chest Tube:390] Intake/Output this shift: No intake/output data recorded.  General appearance: alert and cooperative Neurologic: intact Heart: regular rate and rhythm, S1, S2 normal, no murmur, click, rub or gallop Lungs: diminished breath sounds bilaterally Extremities: edema 1+ Wound: c/d/i  Lab Results: Recent Labs    05/01/19 1957 05/02/19 0403  WBC 17.3* 15.8*  HGB 12.4* 12.3*  12.2*  HCT 36.7* 38.1*  36.0*  PLT 202 188   BMET:  Recent Labs    05/01/19 1957 05/02/19 0403  NA 139 139  140  K 4.1 4.1  4.0  CL 106 106  CO2 24 24  GLUCOSE 138* 117*  BUN 13 14  CREATININE 0.86 0.97  CALCIUM 7.5* 7.9*    PT/INR:  Recent Labs    05/01/19 1408  LABPROT 16.6*  INR 1.4*   ABG    Component Value Date/Time   PHART 7.394 05/02/2019 0403   HCO3 24.1 05/02/2019 0403   TCO2 25  05/02/2019 0403   ACIDBASEDEF 1.0 05/02/2019 0403   O2SAT 97.0 05/02/2019 0403   CBG (last 3)  Recent Labs    05/02/19 0359 05/02/19 0556 05/02/19 0700  GLUCAP 112* 115* 90    Assessment/Plan: S/P Procedure(s) (LRB): CORONARY ARTERY BYPASS GRAFTING (CABG) TIMES FOUR USING LEFT AND RIGHT INTERNAL MAMMARY ARTERIES AND RIGHT RADIAL ARTERY WITH STERNAL PLATING (N/A) TRANSESOPHAGEAL ECHOCARDIOGRAM (TEE) (N/A) INDOCYANINE GREEN FLUORESCENCE IMAGING (ICG) (N/A) Right Radial Artery Harvest (Right) Mobilize begin Plavix (ASA allergy); oob to chair; d/c pa catheter   LOS: 1 day    Antonio Carlson 05/02/2019

## 2019-05-02 NOTE — Progress Notes (Signed)
Swan d/c at 0830, VS stable, pt alert and comfortable.

## 2019-05-02 NOTE — Addendum Note (Signed)
Addendum  created 05/02/19 0620 by Josephine Igo, CRNA   Order list changed

## 2019-05-02 NOTE — Plan of Care (Signed)
  Problem: Education: Goal: Knowledge of the prescribed therapeutic regimen will improve Outcome: Progressing   Problem: Activity: Goal: Risk for activity intolerance will decrease Outcome: Progressing   Problem: Cardiac: Goal: Will achieve and/or maintain hemodynamic stability Outcome: Progressing   Problem: Clinical Measurements: Goal: Postoperative complications will be avoided or minimized Outcome: Progressing   Problem: Respiratory: Goal: Respiratory status will improve Outcome: Progressing   Problem: Skin Integrity: Goal: Risk for impaired skin integrity will decrease Outcome: Progressing   Problem: Urinary Elimination: Goal: Ability to achieve and maintain adequate renal perfusion and functioning will improve Outcome: Progressing

## 2019-05-03 ENCOUNTER — Inpatient Hospital Stay (HOSPITAL_COMMUNITY): Payer: Medicare Other

## 2019-05-03 LAB — CBC WITH DIFFERENTIAL/PLATELET
Abs Immature Granulocytes: 0.11 10*3/uL — ABNORMAL HIGH (ref 0.00–0.07)
Basophils Absolute: 0 10*3/uL (ref 0.0–0.1)
Basophils Relative: 0 %
Eosinophils Absolute: 0.1 10*3/uL (ref 0.0–0.5)
Eosinophils Relative: 1 %
HCT: 35 % — ABNORMAL LOW (ref 39.0–52.0)
Hemoglobin: 11.5 g/dL — ABNORMAL LOW (ref 13.0–17.0)
Immature Granulocytes: 1 %
Lymphocytes Relative: 9 %
Lymphs Abs: 1.4 10*3/uL (ref 0.7–4.0)
MCH: 29.3 pg (ref 26.0–34.0)
MCHC: 32.9 g/dL (ref 30.0–36.0)
MCV: 89.1 fL (ref 80.0–100.0)
Monocytes Absolute: 1.8 10*3/uL — ABNORMAL HIGH (ref 0.1–1.0)
Monocytes Relative: 12 %
Neutro Abs: 11.9 10*3/uL — ABNORMAL HIGH (ref 1.7–7.7)
Neutrophils Relative %: 77 %
Platelets: 163 10*3/uL (ref 150–400)
RBC: 3.93 MIL/uL — ABNORMAL LOW (ref 4.22–5.81)
RDW: 14.8 % (ref 11.5–15.5)
WBC: 15.4 10*3/uL — ABNORMAL HIGH (ref 4.0–10.5)
nRBC: 0 % (ref 0.0–0.2)

## 2019-05-03 LAB — GLUCOSE, CAPILLARY
Glucose-Capillary: 117 mg/dL — ABNORMAL HIGH (ref 70–99)
Glucose-Capillary: 117 mg/dL — ABNORMAL HIGH (ref 70–99)
Glucose-Capillary: 140 mg/dL — ABNORMAL HIGH (ref 70–99)
Glucose-Capillary: 151 mg/dL — ABNORMAL HIGH (ref 70–99)
Glucose-Capillary: 87 mg/dL (ref 70–99)
Glucose-Capillary: 92 mg/dL (ref 70–99)

## 2019-05-03 LAB — BASIC METABOLIC PANEL
Anion gap: 6 (ref 5–15)
BUN: 18 mg/dL (ref 8–23)
CO2: 26 mmol/L (ref 22–32)
Calcium: 7.8 mg/dL — ABNORMAL LOW (ref 8.9–10.3)
Chloride: 102 mmol/L (ref 98–111)
Creatinine, Ser: 0.85 mg/dL (ref 0.61–1.24)
GFR calc Af Amer: >60 mL/min (ref >60)
GFR calc non Af Amer: >60 mL/min (ref >60)
Glucose, Bld: 121 mg/dL — ABNORMAL HIGH (ref 70–99)
Potassium: 4.2 mmol/L (ref 3.5–5.1)
Sodium: 134 mmol/L — ABNORMAL LOW (ref 135–145)

## 2019-05-03 MED ORDER — FUROSEMIDE 10 MG/ML IJ SOLN
40.0000 mg | Freq: Once | INTRAMUSCULAR | Status: AC
  Administered 2019-05-03: 10:00:00 40 mg via INTRAVENOUS
  Filled 2019-05-03: qty 4

## 2019-05-03 MED ORDER — ISOSORBIDE DINITRATE 10 MG PO TABS
10.0000 mg | ORAL_TABLET | Freq: Three times a day (TID) | ORAL | Status: DC
  Administered 2019-05-03 – 2019-05-09 (×19): 10 mg via ORAL
  Filled 2019-05-03 (×22): qty 1

## 2019-05-03 MED ORDER — METOPROLOL TARTRATE 12.5 MG HALF TABLET
12.5000 mg | ORAL_TABLET | Freq: Three times a day (TID) | ORAL | Status: DC
  Administered 2019-05-03 (×3): 12.5 mg via ORAL
  Filled 2019-05-03 (×3): qty 1

## 2019-05-03 MED ORDER — METOPROLOL TARTRATE 25 MG/10 ML ORAL SUSPENSION
12.5000 mg | Freq: Three times a day (TID) | ORAL | Status: DC
  Filled 2019-05-03: qty 5

## 2019-05-03 MED ORDER — INFLUENZA VAC A&B SA ADJ QUAD 0.5 ML IM PRSY
0.5000 mL | PREFILLED_SYRINGE | INTRAMUSCULAR | Status: AC
  Administered 2019-05-05: 09:00:00 0.5 mL via INTRAMUSCULAR
  Filled 2019-05-03: qty 0.5

## 2019-05-03 MED ORDER — INSULIN ASPART 100 UNIT/ML ~~LOC~~ SOLN
0.0000 [IU] | SUBCUTANEOUS | Status: DC
  Administered 2019-05-03: 12:00:00 2 [IU] via SUBCUTANEOUS

## 2019-05-03 MED FILL — Lidocaine HCl Local Soln Prefilled Syringe 100 MG/5ML (2%): INTRAMUSCULAR | Qty: 5 | Status: AC

## 2019-05-03 MED FILL — Magnesium Sulfate Inj 50%: INTRAMUSCULAR | Qty: 10 | Status: AC

## 2019-05-03 MED FILL — Electrolyte-R (PH 7.4) Solution: INTRAVENOUS | Qty: 3000 | Status: AC

## 2019-05-03 MED FILL — Potassium Chloride Inj 2 mEq/ML: INTRAVENOUS | Qty: 40 | Status: AC

## 2019-05-03 MED FILL — Heparin Sodium (Porcine) Inj 1000 Unit/ML: INTRAMUSCULAR | Qty: 10 | Status: AC

## 2019-05-03 MED FILL — Sodium Chloride IV Soln 0.9%: INTRAVENOUS | Qty: 4000 | Status: AC

## 2019-05-03 MED FILL — Heparin Sodium (Porcine) Inj 1000 Unit/ML: INTRAMUSCULAR | Qty: 30 | Status: AC

## 2019-05-03 MED FILL — Sodium Bicarbonate IV Soln 8.4%: INTRAVENOUS | Qty: 50 | Status: AC

## 2019-05-03 MED FILL — Mannitol IV Soln 20%: INTRAVENOUS | Qty: 500 | Status: AC

## 2019-05-03 NOTE — Progress Notes (Signed)
2 Days Post-Op Procedure(s) (LRB): CORONARY ARTERY BYPASS GRAFTING (CABG) TIMES FOUR USING LEFT AND RIGHT INTERNAL MAMMARY ARTERIES AND RIGHT RADIAL ARTERY WITH STERNAL PLATING (N/A) TRANSESOPHAGEAL ECHOCARDIOGRAM (TEE) (N/A) INDOCYANINE GREEN FLUORESCENCE IMAGING (ICG) (N/A) Right Radial Artery Harvest (Right) Subjective: Bilateral pain in back, corresponding to chest tubes  Objective: Vital signs in last 24 hours: Temp:  [98 F (36.7 C)-98.9 F (37.2 C)] 98 F (36.7 C) (10/07 0400) Pulse Rate:  [94-111] 97 (10/07 0600) Cardiac Rhythm: Normal sinus rhythm (10/07 0400) Resp:  [10-26] 21 (10/07 0600) BP: (89-137)/(58-77) 126/74 (10/07 0600) SpO2:  [96 %-100 %] 97 % (10/07 0600) Arterial Line BP: (128-134)/(58-65) 134/58 (10/06 0900) FiO2 (%):  [40 %] 40 % (10/06 0816)  Hemodynamic parameters for last 24 hours: PAP: (34)/(20) 34/20 CO:  [5.9 L/min] 5.9 L/min CI:  [3 L/min/m2] 3 L/min/m2  Intake/Output from previous day: 10/06 0701 - 10/07 0700 In: 574.9 [I.V.:574.9] Out: 1235 [Urine:1075; Chest Tube:160] Intake/Output this shift: No intake/output data recorded.  General appearance: alert and cooperative Neurologic: intact Heart: regular rate and rhythm, S1, S2 normal, no murmur, click, rub or gallop Lungs: diminished breath sounds bibasilar Extremities: extremities normal, atraumatic, no cyanosis or edema Wound: dressed  Lab Results: Recent Labs    05/02/19 1747 05/03/19 0548  WBC 16.9* 15.4*  HGB 12.0* 11.5*  HCT 35.9* 35.0*  PLT 168 163   BMET:  Recent Labs    05/02/19 1747 05/03/19 0548  NA 135 134*  K 4.0 4.2  CL 101 102  CO2 26 26  GLUCOSE 139* 121*  BUN 18 18  CREATININE 1.07 0.85  CALCIUM 8.2* 7.8*    PT/INR:  Recent Labs    05/01/19 1408  LABPROT 16.6*  INR 1.4*   ABG    Component Value Date/Time   PHART 7.394 05/02/2019 0403   HCO3 24.1 05/02/2019 0403   TCO2 25 05/02/2019 0403   ACIDBASEDEF 1.0 05/02/2019 0403   O2SAT 97.0  05/02/2019 0403   CBG (last 3)  Recent Labs    05/02/19 1958 05/02/19 2025 05/03/19 0010  GLUCAP 44* 121* 117*    Assessment/Plan: S/P Procedure(s) (LRB): CORONARY ARTERY BYPASS GRAFTING (CABG) TIMES FOUR USING LEFT AND RIGHT INTERNAL MAMMARY ARTERIES AND RIGHT RADIAL ARTERY WITH STERNAL PLATING (N/A) TRANSESOPHAGEAL ECHOCARDIOGRAM (TEE) (N/A) INDOCYANINE GREEN FLUORESCENCE IMAGING (ICG) (N/A) Right Radial Artery Harvest (Right) transfer to tele; diuresis   LOS: 2 days    [name] 05/03/2019

## 2019-05-03 NOTE — Plan of Care (Signed)

## 2019-05-03 NOTE — Plan of Care (Signed)
  Problem: Education: ?Goal: Will demonstrate proper wound care and an understanding of methods to prevent future damage ?Outcome: Progressing ?Goal: Knowledge of disease or condition will improve ?Outcome: Progressing ?Goal: Knowledge of the prescribed therapeutic regimen will improve ?Outcome: Progressing ?  ?Problem: Activity: ?Goal: Risk for activity intolerance will decrease ?Outcome: Progressing ?  ?Problem: Cardiac: ?Goal: Will achieve and/or maintain hemodynamic stability ?Outcome: Progressing ?  ?Problem: Clinical Measurements: ?Goal: Postoperative complications will be avoided or minimized ?Outcome: Progressing ?  ?Problem: Respiratory: ?Goal: Respiratory status will improve ?Outcome: Progressing ?  ?Problem: Skin Integrity: ?Goal: Wound healing without signs and symptoms of infection ?Outcome: Progressing ?Goal: Risk for impaired skin integrity will decrease ?Outcome: Progressing ?  ?Problem: Urinary Elimination: ?Goal: Ability to achieve and maintain adequate renal perfusion and functioning will improve ?Outcome: Progressing ?  ?

## 2019-05-03 NOTE — Progress Notes (Signed)
Inpatient Diabetes Program Recommendations  AACE/ADA: New Consensus Statement on Inpatient Glycemic Control (2015)  Target Ranges:  Prepandial:   less than 140 mg/dL      Peak postprandial:   less than 180 mg/dL (1-2 hours)      Critically ill patients:  140 - 180 mg/dL   Lab Results  Component Value Date   GLUCAP 140 (H) 05/03/2019   HGBA1C 5.8 (H) 04/27/2019    Review of Glycemic Control  Results for Esquibel, Antonio Carlson (MRN [id number]) as of 05/03/2019 11:20  Ref. Range 05/02/2019 15:45 05/02/2019 19:58 05/02/2019 20:25 05/03/2019 00:10 05/03/2019 08:30  Glucose-Capillary Latest Ref Range: 70 - 99 mg/dL 117 (H) 44 (LL) 121 (H) 117 (H) 140 (H)   Diabetes history: None Outpatient Diabetes medications: None Current orders for Inpatient glycemic control:  TCTS Novolog correction q 4 hours  Inpatient Diabetes Program Recommendations:    Please consider reducing Novolog correction to Novolog sensitive tid with meals.    Thanks,  Adah Perl, RN, BC-ADM Inpatient Diabetes Coordinator Pager [phone] (8a-5p)

## 2019-05-03 NOTE — Research (Signed)
[facility] study Visit 4 [facility] obtained.

## 2019-05-03 NOTE — Progress Notes (Signed)
EVENING ROUNDS NOTE :     [address]       [place] [address]             [phone]                 2 Days Post-Op Procedure(s) (LRB): CORONARY ARTERY BYPASS GRAFTING (CABG) TIMES FOUR USING LEFT AND RIGHT INTERNAL MAMMARY ARTERIES AND RIGHT RADIAL ARTERY WITH STERNAL PLATING (N/A) TRANSESOPHAGEAL ECHOCARDIOGRAM (TEE) (N/A) INDOCYANINE GREEN FLUORESCENCE IMAGING (ICG) (N/A) Right Radial Artery Harvest (Right)   Total Length of Stay:  LOS: 2 days  Events:  Resting comfortably Sinus tach Awaiting floor bed    BP 120/68   Pulse (!) 106   Temp 97.7 F (36.5 C) (Oral)   Resp (!) 36   Ht 5\' 8"  (1.727 m)   Wt 84 kg   SpO2 97%   BMI 28.16 kg/m         . sodium chloride Stopped (05/02/19 0756)  . sodium chloride    . sodium chloride 20 mL/hr at 05/01/19 1355  . lactated ringers    . lactated ringers    . lactated ringers 20 mL/hr at 05/03/19 1600  . nitroGLYCERIN Stopped (05/03/19 1011)    I/O last 3 completed shifts: In: 1237.6 [I.V.:1027.7; IV Piggyback:209.9] Out: [id number]; Chest Tube:400]   CBC Latest Ref Rng & Units 05/03/2019 05/02/2019 05/02/2019  WBC 4.0 - 10.5 K/uL 15.4(H) 16.9(H) 15.8(H)  Hemoglobin 13.0 - 17.0 g/dL 11.5(L) 12.0(L) 12.3(L)  Hematocrit 39.0 - 52.0 % 35.0(L) 35.9(L) 38.1(L)  Platelets 150 - 400 K/uL 163 168 188    BMP Latest Ref Rng & Units 05/03/2019 05/02/2019 05/02/2019  Glucose 70 - 99 mg/dL 121(H) 139(H) 117(H)  BUN 8 - 23 mg/dL 18 18 14   Creatinine 0.61 - 1.24 mg/dL 0.85 1.07 0.97  BUN/Creat Ratio 10 - 24 - - -  Sodium 135 - 145 mmol/L 134(L) 135 139  Potassium 3.5 - 5.1 mmol/L 4.2 4.0 4.1  Chloride 98 - 111 mmol/L 102 101 106  CO2 22 - 32 mmol/L 26 26 24   Calcium 8.9 - 10.3 mg/dL 7.8(L) 8.2(L) 7.9(L)    ABG    Component Value Date/Time   PHART 7.394 05/02/2019 0403   PCO2ART 39.1 05/02/2019 0403   PO2ART 87.0 05/02/2019 0403   HCO3 24.1 05/02/2019 0403   TCO2 25 05/02/2019 0403   ACIDBASEDEF  1.0 05/02/2019 0403   O2SAT 97.0 05/02/2019 0403       Melodie Bouillon, MD 05/03/2019 6:22 PM

## 2019-05-04 ENCOUNTER — Other Ambulatory Visit: Payer: Self-pay

## 2019-05-04 LAB — GLUCOSE, CAPILLARY
Glucose-Capillary: 94 mg/dL (ref 70–99)
Glucose-Capillary: 99 mg/dL (ref 70–99)

## 2019-05-04 MED ORDER — AMIODARONE HCL IN DEXTROSE 360-4.14 MG/200ML-% IV SOLN
30.0000 mg/h | INTRAVENOUS | Status: DC
  Administered 2019-05-04 – 2019-05-05 (×3): 30 mg/h via INTRAVENOUS
  Filled 2019-05-04 (×9): qty 200

## 2019-05-04 MED ORDER — LACTULOSE 10 GM/15ML PO SOLN: 20.0000 g | Freq: Once | ORAL | Status: DC

## 2019-05-04 MED ORDER — AMIODARONE HCL IN DEXTROSE 360-4.14 MG/200ML-% IV SOLN
60.0000 mg/h | INTRAVENOUS | Status: DC
  Administered 2019-05-04 (×2): 60 mg/h via INTRAVENOUS
  Filled 2019-05-04: qty 200

## 2019-05-04 MED ORDER — METOPROLOL TARTRATE 25 MG/10 ML ORAL SUSPENSION
25.0000 mg | Freq: Three times a day (TID) | ORAL | Status: DC
  Filled 2019-05-04 (×5): qty 10

## 2019-05-04 MED ORDER — METOPROLOL TARTRATE 25 MG PO TABS
25.0000 mg | ORAL_TABLET | Freq: Three times a day (TID) | ORAL | Status: DC
  Administered 2019-05-04 – 2019-05-05 (×4): 25 mg via ORAL
  Filled 2019-05-04 (×4): qty 1

## 2019-05-04 MED ORDER — FUROSEMIDE 40 MG PO TABS
40.0000 mg | ORAL_TABLET | Freq: Every day | ORAL | Status: DC
  Administered 2019-05-04 – 2019-05-09 (×6): 40 mg via ORAL
  Filled 2019-05-04 (×6): qty 1

## 2019-05-04 MED ORDER — GUAIFENESIN ER 600 MG PO TB12
600.0000 mg | ORAL_TABLET | Freq: Two times a day (BID) | ORAL | Status: DC
  Administered 2019-05-04 – 2019-05-09 (×11): 600 mg via ORAL
  Filled 2019-05-04 (×11): qty 1

## 2019-05-04 MED ORDER — POTASSIUM CHLORIDE CRYS ER 20 MEQ PO TBCR
20.0000 meq | EXTENDED_RELEASE_TABLET | Freq: Every day | ORAL | Status: DC
  Administered 2019-05-04 – 2019-05-06 (×3): 20 meq via ORAL
  Filled 2019-05-04 (×4): qty 1

## 2019-05-04 MED ORDER — ATORVASTATIN CALCIUM 10 MG PO TABS
20.0000 mg | ORAL_TABLET | Freq: Every day | ORAL | Status: DC
  Administered 2019-05-04 – 2019-05-06 (×3): 20 mg via ORAL
  Filled 2019-05-04 (×5): qty 2

## 2019-05-04 MED ORDER — AMIODARONE LOAD VIA INFUSION
150.0000 mg | Freq: Once | INTRAVENOUS | Status: AC
  Administered 2019-05-04: 07:00:00 150 mg via INTRAVENOUS
  Filled 2019-05-04: qty 83.34

## 2019-05-04 NOTE — Progress Notes (Signed)
  Amiodarone Drug - Drug Interaction Consult Note  Recommendations: Per [facility] P&T committee policy: Pharmacy has  substituted of Atorvastatin 20 mg for Simvastatin 40 mg due to now on Amiodarone.    Amiodarone is metabolized by the cytochrome P450 system and therefore has the potential to cause many drug interactions. Amiodarone has an average plasma half-life of 50 days (range 20 to 100 days).   There is potential for drug interactions to occur several weeks or months after stopping treatment and the onset of drug interactions may be slow after initiating amiodarone.   [x]  Statins: Increased risk of myopathy. Simvastatin- restrict dose to 20mg  daily. Per [facility] P&T committee policy: Pharmacy substitution of Atorvastatin 20 mg for Simvastatin 40 mg due to now on Amiodarone   Other statins: counsel patients to report any muscle pain or weakness immediately.  []  Anticoagulants: Amiodarone can increase anticoagulant effect. Consider warfarin dose reduction. Patients should be monitored closely and the dose of anticoagulant altered accordingly, remembering that amiodarone levels take several weeks to stabilize.  []  Antiepileptics: Amiodarone can increase plasma concentration of phenytoin, the dose should be reduced. Note that small changes in phenytoin dose can result in large changes in levels. Monitor patient and counsel on signs of toxicity.  [x]  Beta blockers: increased risk of bradycardia, AV block and myocardial depression. Sotalol - avoid concomitant use.  []   Calcium channel blockers (diltiazem and verapamil): increased risk of bradycardia, AV block and myocardial depression.  []   Cyclosporine: Amiodarone increases levels of cyclosporine. Reduced dose of cyclosporine is recommended.  []  Digoxin dose should be halved when amiodarone is started.  [x]  Diuretics: increased risk of cardiotoxicity if hypokalemia occurs.  []  Oral hypoglycemic agents (glyburide, glipizide, glimepiride): increased  risk of hypoglycemia. Patient's glucose levels should be monitored closely when initiating amiodarone therapy.   []  Drugs that prolong the QT interval:  Torsades de pointes risk may be increased with concurrent use - avoid if possible.  Monitor QTc, also keep magnesium/potassium WNL if concurrent therapy can't be avoided. [name] Kitchen Antibiotics: e.g. fluoroquinolones, erythromycin. . Antiarrhythmics: e.g. quinidine, procainamide, disopyramide, sotalol. . Antipsychotics: e.g. phenothiazines, haloperidol.  . Lithium, tricyclic antidepressants, and methadone.  Thank You,  Nicole Cella, RPh Clinical Pharmacist Please check AMION for all [place] phone numbers After 10:00 PM, call [place] [phone] 05/04/2019 5:10 PM

## 2019-05-04 NOTE — Plan of Care (Signed)
  Problem: Education: Goal: Will demonstrate proper wound care and an understanding of methods to prevent future damage 05/04/2019 2344 by Drenda Freeze, RN Outcome: Progressing 05/04/2019 2343 by Drenda Freeze, RN Outcome: Progressing   Problem: Education: Goal: Knowledge of General Education information will improve Description: Including pain rating scale, medication(s)/side effects and non-pharmacologic comfort measures Outcome: Progressing   Problem: Clinical Measurements: Goal: Ability to maintain clinical measurements within normal limits will improve Outcome: Progressing

## 2019-05-04 NOTE — Care Management Important Message (Signed)
Important Message  Patient Details  Name: GIDDEON BESSINGER MRN: [id number] Date of Birth: [birth year]   Medicare Important Message Given:  Yes     Shelda Altes 05/04/2019, 4:05 PM

## 2019-05-04 NOTE — Progress Notes (Signed)
CARDIAC REHAB PHASE I   PRE:  Rate/Rhythm: 92 SR with occ PVCs  BP:  Sitting: 110/68      SaO2: 97 RA  MODE:  Ambulation: 470 ft   POST:  Rate/Rhythm: 110 ST with freq PVCS  BP:  Sitting: 123/60    SaO2: 98 RA   Pt ambulated 454ft in hallway assist of one with front wheel walker. Pt c/o some SOB, one short standing rest break. Pt maintained sats on RA. Encouraged walks, IS use, and sitting in chair for meals. Pt able to demonstrate 750 on IS. Will continue to follow and encourage ambulation.  [id number] Rufina Falco, RN BSN 05/04/2019 1:59 PM

## 2019-05-04 NOTE — Progress Notes (Signed)
      [address]       [place] [address]             [phone]        3 Days Post-Op Procedure(s) (LRB): CORONARY ARTERY BYPASS GRAFTING (CABG) TIMES FOUR USING LEFT AND RIGHT INTERNAL MAMMARY ARTERIES AND RIGHT RADIAL ARTERY WITH STERNAL PLATING (N/A) TRANSESOPHAGEAL ECHOCARDIOGRAM (TEE) (N/A) INDOCYANINE GREEN FLUORESCENCE IMAGING (ICG) (N/A) Right Radial Artery Harvest (Right)  Subjective: Patient passing flatus but no bowel movement yet.  Objective: Vital signs in last 24 hours: Temp:  [97.6 F (36.4 C)-99.1 F (37.3 C)] 97.6 F (36.4 C) (10/08 0343) Pulse Rate:  [96-114] 102 (10/08 0200) Cardiac Rhythm: Supraventricular tachycardia (10/08 0630) Resp:  [15-37] 17 (10/08 0200) BP: (87-151)/(56-94) 110/70 (10/08 0200) SpO2:  [94 %-100 %] 99 % (10/08 0200)  Pre op weight 81 kg Current Weight  05/02/19 84 kg       Intake/Output from previous day: 10/07 0701 - 10/08 0700 In: 321.4 [P.O.:120; I.V.:201.4] Out: 2030 [Urine:2030]   Physical Exam:  Cardiovascular: [facility] Pulmonary: Slightly diminished at bases Abdomen: Soft, non tender, bowel sounds present. Extremities: Mild bilateral lower extremity edema. Wounds: Aquacel dressing removed on sternal and right forearm. Wounds are clean and dry.  No erythema or signs of infection. Motor/sensory intact RUE  Lab Results: CBC: Recent Labs    05/02/19 1747 05/03/19 0548  WBC 16.9* 15.4*  HGB 12.0* 11.5*  HCT 35.9* 35.0*  PLT 168 163   BMET:  Recent Labs    05/02/19 1747 05/03/19 0548  NA 135 134*  K 4.0 4.2  CL 101 102  CO2 26 26  GLUCOSE 139* 121*  BUN 18 18  CREATININE 1.07 0.85  CALCIUM 8.2* 7.8*    PT/INR:  Lab Results  Component Value Date   INR 1.4 (H) 05/01/2019   INR 1.0 04/27/2019   INR 0.9 05/10/2008   ABG:  INR: Will add last result for INR, ABG once components are confirmed Will add last 4 CBG results once components are confirmed  Assessment/Plan:   1. CV - Had HR up to 170 earlier this am and was given IV Lopressor. A fib this am. On Amiodarone drip,Lopressor 12.5 mg bid and Isordil 10 mg tid. Will increase Lopressor. 2.  Pulmonary - On 2 liters via [place]. Will wean as able. Continue Pulmicort and Brovana. Mucinex for cough 3. Volume Overload - Will start oral Lasix 4.  Acute blood loss anemia - H and H yesterday 11.5 and 35 5. CBGs 87/94/99. Pre op HGA1C 5.8. He likely has pre diabetes. Will stop accu checks and SS PRN. Will provide nutritional information at discharge and he will need to follow up with medical doctor in a few months. 6. Remove foley 7. Patient still with right IJ but Amiodarone drip infusing. Will remove in am as hope to put on oral Amiodarone. 8. LOC constipation in am if no bowel movement today  Mikel Pyon M ZimmermanPA-C 05/04/2019,7:43 AM [phone]

## 2019-05-04 NOTE — Progress Notes (Signed)
Received call from central telemetry and noted on monitor patient heart rate sustaining in 160s and 170s, Dosed patient with 5mg  IV metoprolol IV. Oncall physician notified, orders received. Will continue to monitor.

## 2019-05-05 ENCOUNTER — Inpatient Hospital Stay (HOSPITAL_COMMUNITY): Payer: Medicare Other

## 2019-05-05 ENCOUNTER — Encounter (HOSPITAL_COMMUNITY): Payer: Self-pay | Admitting: Cardiothoracic Surgery

## 2019-05-05 MED ORDER — AMIODARONE HCL 200 MG PO TABS
400.0000 mg | ORAL_TABLET | Freq: Two times a day (BID) | ORAL | Status: DC
  Administered 2019-05-05 – 2019-05-09 (×9): 400 mg via ORAL
  Filled 2019-05-05 (×9): qty 2

## 2019-05-05 MED ORDER — METOPROLOL TARTRATE 25 MG/10 ML ORAL SUSPENSION
25.0000 mg | Freq: Two times a day (BID) | ORAL | Status: DC
  Filled 2019-05-05 (×2): qty 10

## 2019-05-05 MED ORDER — METOPROLOL TARTRATE 25 MG PO TABS
25.0000 mg | ORAL_TABLET | Freq: Two times a day (BID) | ORAL | Status: DC
  Administered 2019-05-05: 22:00:00 25 mg via ORAL
  Filled 2019-05-05 (×2): qty 1

## 2019-05-05 NOTE — Progress Notes (Addendum)
      [address]       [place] [address]             [phone]        4 Days Post-Op Procedure(s) (LRB): CORONARY ARTERY BYPASS GRAFTING (CABG) TIMES FOUR USING LEFT AND RIGHT INTERNAL MAMMARY ARTERIES AND RIGHT RADIAL ARTERY WITH STERNAL PLATING (N/A) TRANSESOPHAGEAL ECHOCARDIOGRAM (TEE) (N/A) INDOCYANINE GREEN FLUORESCENCE IMAGING (ICG) (N/A) Right Radial Artery Harvest (Right)  Subjective: Patient had bowel movement yesterday and rather loose stools. Laxatives already stopped. He just returned from x ray and is eating a banana. He has no specific complaints.  Objective: Vital signs in last 24 hours: Temp:  [97.9 F (36.6 C)-98.4 F (36.9 C)] 98.2 F (36.8 C) (10/09 0430) Pulse Rate:  [78-115] 101 (10/09 0644) Cardiac Rhythm: Normal sinus rhythm (10/08 2000) Resp:  [17-29] 29 (10/09 0644) BP: (85-123)/(50-75) 115/69 (10/09 0430) SpO2:  [91 %-100 %] 93 % (10/09 0644) Weight:  [82.1 kg] 82.1 kg (10/09 0644)  Pre op weight 81 kg Current Weight  05/05/19 82.1 kg       Intake/Output from previous day: 10/08 0701 - 10/09 0700 In: 1023.9 [P.O.:600; I.V.:423.9] Out: 450 [Urine:450]   Physical Exam:  Cardiovascular: RRR Pulmonary: Slightly diminished at bases Abdomen: Soft, non tender, bowel sounds present. Extremities: Mild bilateral lower extremity edema. Varicosities LEs Wounds: Aquacel dressing removed on sternal and right forearm. Wounds are clean and dry.  No erythema or signs of infection. Motor/sensory intact RUE  Lab Results: CBC: Recent Labs    05/02/19 1747 05/03/19 0548  WBC 16.9* 15.4*  HGB 12.0* 11.5*  HCT 35.9* 35.0*  PLT 168 163   BMET:  Recent Labs    05/02/19 1747 05/03/19 0548  NA 135 134*  K 4.0 4.2  CL 101 102  CO2 26 26  GLUCOSE 139* 121*  BUN 18 18  CREATININE 1.07 0.85  CALCIUM 8.2* 7.8*    PT/INR:  Lab Results  Component Value Date   INR 1.4 (H) 05/01/2019   INR 1.0 04/27/2019   INR 0.9  05/10/2008   ABG:  INR: Will add last result for INR, ABG once components are confirmed Will add last 4 CBG results once components are confirmed  Assessment/Plan:  1. CV - Previous a fib with RVR. Had a fib again earlier this am. On Amiodarone drip, Lopressor 25 mg tid and Isordil 10 mg tid. BP labile at times so will decrease Lopressor to 25 mg bid. Will transition IV Amiodarone to oral later this am 2.  Pulmonary - On room air. CXR this am appears to show bibasilar atelectasis and small pleural effusions. Continue Pulmicort and Brovana. Mucinex for cough 3. Volume Overload - Continue oral Lasix 4.  Acute blood loss anemia - Last H and H  11.5 and 35 5. Patient still with right IJ but Amiodarone drip infusing. Will remove as will put on oral Amiodarone. 6. Possibly discharge in am, if maintains SR  Donielle M ZimmermanPA-C 05/05/2019,7:09 AM [phone]  Pt seen and examined; agree with note. Anticipate discharge over the weekend if he remains in sinus. Has had only brief episodes of AF. F/u next Friday Chyenne Sobczak Z. Orvan Seen, [place]

## 2019-05-05 NOTE — Progress Notes (Signed)
Patient went back into Afib RVR/ ST rates in the 150's Lopressor 5mg  PRN given, patient now in SR, HR 99 Donielle PA notified, will continue to monitor.

## 2019-05-05 NOTE — Progress Notes (Signed)
CARDIAC REHAB PHASE I   Offered to walk with pt. RN has plans to d/c IJ soon. Will hold ambulating til after bedrest. Pt educated on importance of walks, IS use, and sitting in recliner. Will follow-up after bedrest is over.  Rufina Falco, RN BSN 05/05/2019 9:09 AM

## 2019-05-05 NOTE — Plan of Care (Signed)
  Problem: Education: Goal: Will demonstrate proper wound care and an understanding of methods to prevent future damage Outcome: Progressing   Problem: Education: Goal: Knowledge of disease or condition will improve Outcome: Progressing   

## 2019-05-05 NOTE — Progress Notes (Signed)
CARDIAC REHAB PHASE I   PRE:  Rate/Rhythm: 84 SR with occ PVCs  BP:  Sitting: 132/74      SaO2: 96 RA  MODE:  Ambulation: 470 ft   POST:  Rate/Rhythm: 108 ST with freq PVCs  BP:  Sitting: 132/79    SaO2: 97 RA   Pt ambulated 439ft in hallway min guard with front wheel walker. Pt denies CP or SOB. Pt returned to room, sitting on side of bed. D/c education completed with pt and wife. Pt educated on importance of showers and monitoring incisions daily. Encouraged continued IS use, walks, and sternal precautions. Pt given in-the-tube sheet along with heart healthy diet. Reviewed restrictions and exercise guidelines. Will refer to CRP II [place], pt agreeable to Virtual Cardiac Rehab.  Pt is interested in participating in Virtual Cardiac and Pulmonary Rehab. Pt advised that Virtual Cardiac and Pulmonary Rehab is provided at no cost to the patient.  Checklist:  1. Pt has smart device  ie smartphone and/or ipad for downloading an app  Yes 2. Reliable internet/wifi service    Yes 3. Understands how to use their smartphone and navigate within an app.  Yes  Pt verbalized understanding and is in agreement.   [id number] Rufina Falco, RN BSN 05/05/2019 11:34 AM

## 2019-05-05 NOTE — Progress Notes (Signed)
While ambulating in the hall patient went into afib RVR with rate in the 140's. Patient stated ''I feel a little bit jittery''. Patient spontaneously went back to SR after some minutes, vital signs remain stable BP 136/80 HR 102, O2 sat 92% on room air will continue to monitor.

## 2019-05-06 MED ORDER — METOPROLOL TARTRATE 25 MG/10 ML ORAL SUSPENSION
37.5000 mg | Freq: Two times a day (BID) | ORAL | Status: DC
  Filled 2019-05-06 (×2): qty 15

## 2019-05-06 MED ORDER — METOCLOPRAMIDE HCL 5 MG/ML IJ SOLN
10.0000 mg | Freq: Four times a day (QID) | INTRAMUSCULAR | Status: AC
  Administered 2019-05-06 (×2): 10 mg via INTRAVENOUS
  Filled 2019-05-06: qty 2

## 2019-05-06 MED ORDER — APIXABAN 2.5 MG PO TABS
2.5000 mg | ORAL_TABLET | Freq: Two times a day (BID) | ORAL | Status: DC
  Administered 2019-05-06 (×2): 2.5 mg via ORAL
  Filled 2019-05-06 (×3): qty 1

## 2019-05-06 MED ORDER — METOPROLOL TARTRATE 25 MG PO TABS
37.5000 mg | ORAL_TABLET | Freq: Two times a day (BID) | ORAL | Status: DC
  Administered 2019-05-06 (×2): 37.5 mg via ORAL
  Filled 2019-05-06 (×2): qty 1

## 2019-05-06 NOTE — Progress Notes (Signed)
CARDIAC REHAB PHASE I   PRE:  Rate/Rhythm: 91 SR with occ PVC  BP:  Sitting: 135/86      SaO2: 97 RA  MODE:  Ambulation: 470 ft   POST:  Rate/Rhythm: 104 ST with PVCs  BP:  Sitting: 146/84    SaO2: 98 RA  Pt ambulated 478ft in hallway assist of one with front wheel walker. Pt denies CP or SOB. Pt still hopeful for d/c tomorrow. Encouraged pt to pay the fluttery feeling he gets in his chest. Pt and wife deny questions regarding yesterdays education.   [id number] Rufina Falco, RN BSN 05/06/2019 2:26 PM

## 2019-05-06 NOTE — Progress Notes (Addendum)
      [address]       [place] [address]             [phone]        5 Days Post-Op Procedure(s) (LRB): CORONARY ARTERY BYPASS GRAFTING (CABG) TIMES FOUR USING LEFT AND RIGHT INTERNAL MAMMARY ARTERIES AND RIGHT RADIAL ARTERY WITH STERNAL PLATING (N/A) TRANSESOPHAGEAL ECHOCARDIOGRAM (TEE) (N/A) INDOCYANINE GREEN FLUORESCENCE IMAGING (ICG) (N/A) Right Radial Artery Harvest (Right)  Subjective: Patient with not much appetite. He gets "queezy" once in awhile.  Objective: Vital signs in last 24 hours: Temp:  [97.8 F (36.6 C)-98 F (36.7 C)] 97.8 F (36.6 C) (10/10 0417) Pulse Rate:  [92-149] 92 (10/10 0417) Cardiac Rhythm: Normal sinus rhythm (10/10 0701) Resp:  [17-24] 21 (10/10 0619) BP: (128-136)/(76-83) 128/83 (10/10 0417) SpO2:  [92 %-97 %] 97 % (10/10 0746) Weight:  [81.7 kg] 81.7 kg (10/10 0619)  Pre op weight 81 kg Current Weight  05/06/19 81.7 kg       Intake/Output from previous day: 10/09 0701 - 10/10 0700 In: 449.7 [P.O.:340; I.V.:109.7] Out: 650 [Urine:650]   Physical Exam:  Cardiovascular: Tachycardic Pulmonary: Slightly diminished at bases Abdomen: Soft, non tender, bowel sounds present. Extremities: Mild bilateral lower extremity edema. Varicosities LEs Wounds:  Wounds are clean and dry.  No erythema or signs of infection. Motor/sensory intact RUE  Lab Results: CBC: No results for input(s): WBC, HGB, HCT, PLT in the last 72 hours. BMET:  No results for input(s): NA, K, CL, CO2, GLUCOSE, BUN, CREATININE, CALCIUM in the last 72 hours.  PT/INR:  Lab Results  Component Value Date   INR 1.4 (H) 05/01/2019   INR 1.0 04/27/2019   INR 0.9 05/10/2008   ABG:  INR: Will add last result for INR, ABG once components are confirmed Will add last 4 CBG results once components are confirmed  Assessment/Plan:  1. CV - Previous a fib with RVR. PAF of late, with RVR at times. On Amiodarone 400 mg bid and Lopressor 25 mg bid. Will  increase Lopressor to 37.5 mg bid. He needs DOAC so will start Apixaban low dose today. 2.  Pulmonary - On room air.  Continue Pulmicort and Brovana. Mucinex for cough 3. Volume Overload - Continue oral Lasix 4.  Acute blood loss anemia - Last H and H  11.5 and 35 5. Patient still with right IJ but Amiodarone drip infusing. Will remove as will put on oral Amiodarone. 6. GI-monitor nausea. Will give a few doses of Reglan. Is on Amiodarone and if nausea persistent, will decrease or may need to stop. 7. Hope to discharge in am  Sharalyn Ink ZimmermanPA-C 05/06/2019,8:59 AM [phone]   I have seen and examined the patient and agree with the assessment and plan as outlined.  Still having episodes PAF w/ increased rate.  Convert amiodarone to oral, increase metoprolol, and start DOAC.  Rexene Alberts, MD 05/06/2019 12:29 PM

## 2019-05-06 NOTE — Progress Notes (Signed)
CARDIAC REHAB PHASE I   Went to offer to walk with pt, pt requesting BR use. Pt helped to BR, unsteady on feet, HR noted to jump to the 160s. Pt denied dizziness or palpations. HR maintained. Pt helped to recliner, HR 110s-140s. RN made aware. Pt with call bell, bed side phone and table within reach. Will try to walk later as time allows. D/c education completed yesterday, pt referred to CRP II [place].  [phone] Rufina Falco, RN BSN 05/06/2019 9:26 AM

## 2019-05-07 LAB — BASIC METABOLIC PANEL
Anion gap: 12 (ref 5–15)
BUN: 20 mg/dL (ref 8–23)
CO2: 27 mmol/L (ref 22–32)
Calcium: 8.9 mg/dL (ref 8.9–10.3)
Chloride: 101 mmol/L (ref 98–111)
Creatinine, Ser: 0.94 mg/dL (ref 0.61–1.24)
GFR calc Af Amer: >60 mL/min (ref >60)
GFR calc non Af Amer: >60 mL/min (ref >60)
Glucose, Bld: 91 mg/dL (ref 70–99)
Potassium: 3.9 mmol/L (ref 3.5–5.1)
Sodium: 140 mmol/L (ref 135–145)

## 2019-05-07 MED ORDER — LOPERAMIDE HCL 1 MG/7.5ML PO SUSP: 1.0000 mg | ORAL | Status: DC | PRN

## 2019-05-07 MED ORDER — LOPERAMIDE HCL 1 MG/7.5ML PO SUSP
2.0000 mg | ORAL | Status: DC | PRN
  Administered 2019-05-07 – 2019-05-08 (×2): 2 mg via ORAL
  Filled 2019-05-07 (×4): qty 15

## 2019-05-07 MED ORDER — APIXABAN 2.5 MG PO TABS
2.5000 mg | ORAL_TABLET | Freq: Two times a day (BID) | ORAL | Status: DC
  Administered 2019-05-07 – 2019-05-09 (×4): 2.5 mg via ORAL
  Filled 2019-05-07 (×4): qty 1

## 2019-05-07 MED ORDER — POTASSIUM CHLORIDE CRYS ER 20 MEQ PO TBCR
30.0000 meq | EXTENDED_RELEASE_TABLET | Freq: Every day | ORAL | Status: DC
  Administered 2019-05-07 – 2019-05-09 (×3): 30 meq via ORAL
  Filled 2019-05-07 (×3): qty 1

## 2019-05-07 MED ORDER — ATORVASTATIN CALCIUM 40 MG PO TABS
40.0000 mg | ORAL_TABLET | Freq: Every day | ORAL | Status: DC
  Administered 2019-05-07 – 2019-05-08 (×2): 40 mg via ORAL
  Filled 2019-05-07: qty 1

## 2019-05-07 MED ORDER — METOPROLOL TARTRATE 50 MG PO TABS
50.0000 mg | ORAL_TABLET | Freq: Two times a day (BID) | ORAL | Status: DC
  Administered 2019-05-07 – 2019-05-09 (×5): 50 mg via ORAL
  Filled 2019-05-07 (×5): qty 1

## 2019-05-07 MED ORDER — LISINOPRIL 5 MG PO TABS
5.0000 mg | ORAL_TABLET | Freq: Every day | ORAL | Status: DC
  Administered 2019-05-07: 10:00:00 5 mg via ORAL
  Filled 2019-05-07: qty 1

## 2019-05-07 MED ORDER — APIXABAN 5 MG PO TABS
5.0000 mg | ORAL_TABLET | Freq: Two times a day (BID) | ORAL | Status: DC
  Administered 2019-05-07: 09:00:00 5 mg via ORAL
  Filled 2019-05-07: qty 1

## 2019-05-07 NOTE — Progress Notes (Addendum)
      [address]       [place] [address]             [phone]        6 Days Post-Op Procedure(s) (LRB): CORONARY ARTERY BYPASS GRAFTING (CABG) TIMES FOUR USING LEFT AND RIGHT INTERNAL MAMMARY ARTERIES AND RIGHT RADIAL ARTERY WITH STERNAL PLATING (N/A) TRANSESOPHAGEAL ECHOCARDIOGRAM (TEE) (N/A) INDOCYANINE GREEN FLUORESCENCE IMAGING (ICG) (N/A) Right Radial Artery Harvest (Right)  Subjective: Patient still with not much appetite. He gets "queezy" once in awhile. He had diarrhea last night and earlier this am.  Objective: Vital signs in last 24 hours: Temp:  [97.7 F (36.5 C)-98.2 F (36.8 C)] 98.1 F (36.7 C) (10/11 0748) Pulse Rate:  [89-111] 95 (10/11 0748) Cardiac Rhythm: Normal sinus rhythm (10/10 2028) Resp:  [20-22] 22 (10/11 0748) BP: (109-161)/(80-94) 161/80 (10/11 0748) SpO2:  [94 %-100 %] 100 % (10/11 0748) Weight:  [81.2 kg] 81.2 kg (10/11 0440)  Pre op weight 81 kg Current Weight  05/07/19 81.2 kg       Intake/Output from previous day: 10/10 0701 - 10/11 0700 In: 200 [P.O.:200] Out: 4 [Urine:2; Stool:2]   Physical Exam:  Cardiovascular: Slightly tachycardic Pulmonary: Slightly diminished at bases Abdomen: Soft, non tender, bowel sounds present. Extremities: Mild bilateral lower extremity edema. Varicosities LEs Wounds:  Wounds are clean and dry.  No erythema or signs of infection. Motor/sensory intact RUE  Lab Results: CBC: No results for input(s): WBC, HGB, HCT, PLT in the last 72 hours. BMET:  Recent Labs    05/07/19 0739  NA 140  K 3.9  CL 101  CO2 27  GLUCOSE 91  BUN 20  CREATININE 0.94  CALCIUM 8.9    PT/INR:  Lab Results  Component Value Date   INR 1.4 (H) 05/01/2019   INR 1.0 04/27/2019   INR 0.9 05/10/2008   ABG:  INR: Will add last result for INR, ABG once components are confirmed Will add last 4 CBG results once components are confirmed  Assessment/Plan:  1. CV - PAF with RVR at times.  Hypertensive and slightly tachy (not a fib) this am. On Amiodarone 400 mg bid and Lopressor 37.5 mg bid. Will increase Lopressor to 50 mg bid. He was started on low dose Apixaban yesterday. Per pharmacy, he should be on 5 mg bid. Will keep him on 2.5 mg bid for now and will defer to Dr. Orvan Seen in am. If he continues to a fib not well controlled, will ask cardiology to evaluate.  Will start low dose Lisinopril for better BP control. 2.  Pulmonary - On room air.  Continue Pulmicort and Brovana. Mucinex for cough 3. Volume Overload - Continue oral Lasix 4.  Acute blood loss anemia - Last H and H  11.5 and 35 6. GI-monitor nausea. He was given a few doses of Reglan previously. He is on Amiodarone so will discuss with surgeon in am  if decrease or stop.  Imodium PRN diarrhea, stool softeners alreadystopped 7. Hopefully, home in next few days  Donielle M ZimmermanPA-C 05/07/2019,8:45 AM [phone]    I have seen and examined the patient and agree with the assessment and plan as outlined.  Increase metoprolol.  Continue amiodarone for now.  Rexene Alberts, MD 05/07/2019 11:30 AM

## 2019-05-08 DIAGNOSIS — Z951 Presence of aortocoronary bypass graft: Secondary | ICD-10-CM

## 2019-05-08 DIAGNOSIS — I25118 Atherosclerotic heart disease of native coronary artery with other forms of angina pectoris: Secondary | ICD-10-CM

## 2019-05-08 DIAGNOSIS — I4819 Other persistent atrial fibrillation: Secondary | ICD-10-CM

## 2019-05-08 MED ORDER — ACETAMINOPHEN 500 MG PO TABS: 1000.0000 mg | ORAL_TABLET | Freq: Four times a day (QID) | ORAL | 0 refills | Status: DC | PRN

## 2019-05-08 MED ORDER — GUAIFENESIN ER 600 MG PO TB12: 600.0000 mg | ORAL_TABLET | Freq: Two times a day (BID) | ORAL | Status: DC | PRN

## 2019-05-08 MED ORDER — APIXABAN 5 MG PO TABS: 5.0000 mg | ORAL_TABLET | Freq: Two times a day (BID) | ORAL | 1 refills | Status: DC

## 2019-05-08 MED ORDER — LISINOPRIL 10 MG PO TABS: 10.0000 mg | ORAL_TABLET | Freq: Every day | ORAL | 1 refills | Status: DC

## 2019-05-08 MED ORDER — AMIODARONE HCL 200 MG PO TABS: 200.0000 mg | ORAL_TABLET | Freq: Two times a day (BID) | ORAL | 1 refills | Status: DC

## 2019-05-08 MED ORDER — LISINOPRIL 10 MG PO TABS
10.0000 mg | ORAL_TABLET | Freq: Every day | ORAL | Status: DC
  Administered 2019-05-08 – 2019-05-09 (×2): 10 mg via ORAL
  Filled 2019-05-08 (×2): qty 1

## 2019-05-08 MED ORDER — FUROSEMIDE 40 MG PO TABS: 40.0000 mg | ORAL_TABLET | Freq: Every day | ORAL | 0 refills | Status: DC

## 2019-05-08 MED ORDER — POTASSIUM CHLORIDE CRYS ER 20 MEQ PO TBCR: 20.0000 meq | EXTENDED_RELEASE_TABLET | Freq: Every day | ORAL | 0 refills | Status: DC

## 2019-05-08 MED ORDER — ATORVASTATIN CALCIUM 40 MG PO TABS: 40.0000 mg | ORAL_TABLET | Freq: Every day | ORAL | 1 refills | Status: DC

## 2019-05-08 MED ORDER — ISOSORBIDE DINITRATE 10 MG PO TABS: 10.0000 mg | ORAL_TABLET | Freq: Three times a day (TID) | ORAL | 0 refills | Status: DC

## 2019-05-08 MED ORDER — CLOPIDOGREL BISULFATE 75 MG PO TABS: 75.0000 mg | ORAL_TABLET | Freq: Every day | ORAL | 1 refills | Status: DC

## 2019-05-08 MED ORDER — AMIODARONE IV BOLUS ONLY 150 MG/100ML
150.0000 mg | Freq: Once | INTRAVENOUS | Status: AC
  Administered 2019-05-09: 01:00:00 150 mg via INTRAVENOUS
  Filled 2019-05-08: qty 100

## 2019-05-08 MED ORDER — METOPROLOL TARTRATE 50 MG PO TABS: 50.0000 mg | ORAL_TABLET | Freq: Two times a day (BID) | ORAL | 1 refills | Status: DC

## 2019-05-08 MED ORDER — TRAMADOL HCL 50 MG PO TABS: 50.0000 mg | ORAL_TABLET | Freq: Four times a day (QID) | ORAL | 0 refills | Status: DC | PRN

## 2019-05-08 MED ORDER — AMIODARONE IV BOLUS ONLY 150 MG/100ML
150.0000 mg | Freq: Once | INTRAVENOUS | Status: AC
  Administered 2019-05-08: 11:00:00 150 mg via INTRAVENOUS
  Filled 2019-05-08: qty 100

## 2019-05-08 NOTE — Progress Notes (Signed)
Patient with PAF and rate as high as 150's. Will ask cardiology to evaluate. Patient will be stable for discharge once HR better controlled.

## 2019-05-08 NOTE — Care Management (Signed)
1028 05-08-19 Case Manager received referral for medication assistance. CM did speak with patient regarding Medicare Part D-patient has part D for medication coverage. Patient stated before he did not have any co pays. Benefits Check submitted for all new medications and CM will make patient aware of cost once completed. No further needs identified at this time. Bethena Roys, RN,BSN Case Manager [phone]

## 2019-05-08 NOTE — Progress Notes (Signed)
Transitions of Care Pharmacist Note  Antonio Carlson is a 73 y.o. male that has been diagnosed with a post op a fib and will be prescribed Eliquis (apixaban) at discharge.   Patient Education: I provided the following information on apixaban to the patient: Answered their current questions about affordabilty of the medication.   Discharge Medications Plan: The patient potentially wants to have their discharge medications filled by the Transitions of Care pharmacy rather than their usual pharmacy.  Insurance information: BIN: [id number] PCN: [id number] Group number: [id number] Member ID number: [id number]  I called the [facility] on [address] to obtain insurance information. They had received the patient's DC scripts already. The apixaban is $406 after going through insurance. As CM/SW is following with a benefit check in progress, he may be a candidate to receive a free 30 day supply of apixaban through the [facility] at discharge while coverage is being determined. If the patient fills at the [facility] the prescriptions sent to [facility] will need to be reversed and/or cancelled.    Thank you,   Eddie Candle, PharmD PGY-1 Pharmacy Resident   Please check amion for clinical pharmacist contact number   May 08, 2019

## 2019-05-08 NOTE — Progress Notes (Addendum)
      [address]       [place] [address]             [phone]        7 Days Post-Op Procedure(s) (LRB): CORONARY ARTERY BYPASS GRAFTING (CABG) TIMES FOUR USING LEFT AND RIGHT INTERNAL MAMMARY ARTERIES AND RIGHT RADIAL ARTERY WITH STERNAL PLATING (N/A) TRANSESOPHAGEAL ECHOCARDIOGRAM (TEE) (N/A) INDOCYANINE GREEN FLUORESCENCE IMAGING (ICG) (N/A) Right Radial Artery Harvest (Right)  Subjective: Patient has not had any further diarrhea. He actually feels fairly well this am.  Objective: Vital signs in last 24 hours: Temp:  [98.1 F (36.7 C)-98.5 F (36.9 C)] 98.2 F (36.8 C) (10/12 0600) Pulse Rate:  [95-107] 107 (10/11 2221) Cardiac Rhythm: Normal sinus rhythm (10/11 1934) Resp:  [18-22] 20 (10/12 0600) BP: (131-161)/(78-80) 144/79 (10/11 2221) SpO2:  [93 %-100 %] 93 % (10/11 2221) Weight:  [80 kg] 80 kg (10/12 0600)  Pre op weight 81 kg Current Weight  05/08/19 80 kg       Intake/Output from previous day: No intake/output data recorded.   Physical Exam:  Cardiovascular: RRR Pulmonary: Slightly diminished at bases Abdomen: Soft, non tender, bowel sounds present. Extremities: Trace bilateral lower extremity edema. Varicosities LEs Wounds:  Wounds are clean and dry.  No erythema or signs of infection. Motor/sensory intact RUE  Lab Results: CBC: No results for input(s): WBC, HGB, HCT, PLT in the last 72 hours. BMET:  Recent Labs    05/07/19 0739  NA 140  K 3.9  CL 101  CO2 27  GLUCOSE 91  BUN 20  CREATININE 0.94  CALCIUM 8.9    PT/INR:  Lab Results  Component Value Date   INR 1.4 (H) 05/01/2019   INR 1.0 04/27/2019   INR 0.9 05/10/2008   ABG:  INR: Will add last result for INR, ABG once components are confirmed Will add last 4 CBG results once components are confirmed  Assessment/Plan:  1. CV - PAF with RVR. SR with HR in the 90's this am. On Amiodarone 400 mg bid and Lopressor 50 mg bid, Lisinopril 5 mg daily, and low  dose Apixaban. Per pharmacy, he should be on 5 mg bid. As discussed with Dr. Orvan Seen, will discharge on Apixaban 5 mg bid. Also, will increase Lisinopril for better BP control. 2.  Pulmonary - On room air.  Continue Pulmicort and Brovana. Mucinex for cough 3. Volume Overload - On oral Lasix 4.  Acute blood loss anemia - Last H and H  11.5 and 35 6. GI-monitor nausea. He was given a few doses of Reglan previously. Hopefully, appetite will improve with time and decreased Amiodarone. 7.Chest tube sutures to remain as will remove in office on Friday 10/16.   Anaelle Dunton M ZimmermanPA-C 05/08/2019,7:06 AM [phone]

## 2019-05-08 NOTE — Discharge Instructions (Addendum)
Discharge Instructions:  1. You may shower, please wash incisions daily with soap and water and keep dry.  If you wish to cover wounds with dressing you may do so but please keep clean and change daily.  No tub baths or swimming until incisions have completely healed.  If your incisions become red or develop any drainage please call our office at [phone]  2. No Driving until cleared by Dr. Orvan Seen office and you are no longer using narcotic pain medications  3. Monitor your weight daily.. Please use the same scale and weigh at same time... If you gain 5-10 lbs in 48 hours with associated lower extremity swelling, please contact our office at [phone]  4. Fever of 101.5 for at least 24 hours with no source, please contact our office at [phone]   Prediabetes Eating Plan Prediabetes is a condition that causes blood sugar (glucose) levels to be higher than normal. This increases the risk for developing diabetes. In order to prevent diabetes from developing, your health care provider may recommend a diet and other lifestyle changes to help you:  Control your blood glucose levels.  Improve your cholesterol levels.  Manage your blood pressure. Your health care provider may recommend working with a diet and nutrition specialist (dietitian) to make a meal plan that is best for you. What are tips for following this plan? Lifestyle  Set weight loss goals with the help of your health care team. It is recommended that most people with prediabetes lose 7% of their current body weight.  Exercise for at least 30 minutes at least 5 days a week.  Attend a support group or seek ongoing support from a mental health counselor.  Take over-the-counter and prescription medicines only as told by your health care provider. Reading food labels  Read food labels to check the amount of fat, salt (sodium), and sugar in prepackaged foods. Avoid foods that have: ? Saturated fats. ? Trans  fats. ? Added sugars.  Avoid foods that have more than 300 milligrams (mg) of sodium per serving. Limit your daily sodium intake to less than 2,300 mg each day. Shopping  Avoid buying pre-made and processed foods. Cooking  Cook with olive oil. Do not use butter, lard, or ghee.  Bake, broil, grill, or boil foods. Avoid frying. Meal planning   Work with your dietitian to develop an eating plan that is right for you. This may include: ? Tracking how many calories you take in. Use a food diary, notebook, or mobile application to track what you eat at each meal. ? Using the glycemic index (GI) to plan your meals. The index tells you how quickly a food will raise your blood glucose. Choose low-GI foods. These foods take a longer time to raise blood glucose.  Consider following a Mediterranean diet. This diet includes: ? Several servings each day of fresh fruits and vegetables. ? Eating fish at least twice a week. ? Several servings each day of whole grains, beans, nuts, and seeds. ? Using olive oil instead of other fats. ? Moderate alcohol consumption. ? Eating small amounts of red meat and whole-fat dairy.  If you have high blood pressure, you may need to limit your sodium intake or follow a diet such as the DASH eating plan. DASH is an eating plan that aims to lower high blood pressure. What foods are recommended? The items listed below may not be a complete list. Talk with your dietitian about what dietary choices are best for you. Grains  Whole grains, such as whole-wheat or whole-grain breads, crackers, cereals, and pasta. Unsweetened oatmeal. Bulgur. Barley. Quinoa. Brown rice. Corn or whole-wheat flour tortillas or taco shells. Vegetables Lettuce. Spinach. Peas. Beets. Cauliflower. Cabbage. Broccoli. Carrots. Tomatoes. Squash. Eggplant. Herbs. Peppers. Onions. Cucumbers. Brussels sprouts. Fruits Berries. Bananas. Apples. Oranges. Grapes. Papaya. Mango. Pomegranate. Kiwi.  Grapefruit. Cherries. Meats and other protein foods Seafood. Poultry without skin. Lean cuts of pork and beef. Tofu. Eggs. Nuts. Beans. Dairy Low-fat or fat-free dairy products, such as yogurt, cottage cheese, and cheese. Beverages Water. Tea. Coffee. Sugar-free or diet soda. Seltzer water. Lowfat or no-fat milk. Milk alternatives, such as soy or almond milk. Fats and oils Olive oil. Canola oil. Sunflower oil. Grapeseed oil. Avocado. Walnuts. Sweets and desserts Sugar-free or low-fat pudding. Sugar-free or low-fat ice cream and other frozen treats. Seasoning and other foods Herbs. Sodium-free spices. Mustard. Relish. Low-fat, low-sugar ketchup. Low-fat, low-sugar barbecue sauce. Low-fat or fat-free mayonnaise. What foods are not recommended? The items listed below may not be a complete list. Talk with your dietitian about what dietary choices are best for you. Grains Refined white flour and flour products, such as bread, pasta, snack foods, and cereals. Vegetables Canned vegetables. Frozen vegetables with butter or cream sauce. Fruits Fruits canned with syrup. Meats and other protein foods Fatty cuts of meat. Poultry with skin. Breaded or fried meat. Processed meats. Dairy Full-fat yogurt, cheese, or milk. Beverages Sweetened drinks, such as sweet iced tea and soda. Fats and oils Butter. Lard. Ghee. Sweets and desserts Baked goods, such as cake, cupcakes, pastries, cookies, and cheesecake. Seasoning and other foods Spice mixes with added salt. Ketchup. Barbecue sauce. Mayonnaise. Summary  To prevent diabetes from developing, you may need to make diet and other lifestyle changes to help control blood sugar, improve cholesterol levels, and manage your blood pressure.  Set weight loss goals with the help of your health care team. It is recommended that most people with prediabetes lose 7 percent of their current body weight.  Consider following a Mediterranean diet that includes  plenty of fresh fruits and vegetables, whole grains, beans, nuts, seeds, fish, lean meat, low-fat dairy, and healthy oils. This information is not intended to replace advice given to you by your health care provider. Make sure you discuss any questions you have with your health care provider. Document Released: 11/27/2014 Document Revised: 11/04/2018 Document Reviewed: 09/16/2016 Elsevier Patient Education  [place].   5. Activity- up as tolerated, please walk at least 3 times per day.  Avoid strenuous activity, no lifting, pushing, or pulling with your arms over 8-10 lbs for a minimum of 6 weeks  6. If any questions or concerns arise, please do not hesitate to contact our office at [phone]     Information on my medicine - ELIQUIS (apixaban)  Why was Eliquis prescribed for you? Eliquis was prescribed for you to reduce the risk of a blood clot forming that can cause a stroke if you have a medical condition called atrial fibrillation (a type of irregular heartbeat).  What do You need to know about Eliquis ? Take your Eliquis TWICE DAILY - one tablet in the morning and one tablet in the evening with or without food. If you have difficulty swallowing the tablet whole please discuss with your pharmacist how to take the medication safely.  Take Eliquis exactly as prescribed by your doctor and DO NOT stop taking Eliquis without talking to the doctor who prescribed the medication.  Stopping may increase your  risk of developing a stroke.  Refill your prescription before you run out.  After discharge, you should have regular check-up appointments with your healthcare provider that is prescribing your Eliquis.  In the future your dose may need to be changed if your kidney function or weight changes by a significant amount or as you get older.  What do you do if you miss a dose? If you miss a dose, take it as soon as you remember on the same day and resume taking twice daily.  Do  not take more than one dose of ELIQUIS at the same time to make up a missed dose.  Important Safety Information A possible side effect of Eliquis is bleeding. You should call your healthcare provider right away if you experience any of the following: ? Bleeding from an injury or your nose that does not stop. ? Unusual colored urine (red or dark brown) or unusual colored stools (red or black). ? Unusual bruising for unknown reasons. ? A serious fall or if you hit your head (even if there is no bleeding).  Some medicines may interact with Eliquis and might increase your risk of bleeding or clotting while on Eliquis. To help avoid this, consult your healthcare provider or pharmacist prior to using any new prescription or non-prescription medications, including herbals, vitamins, non-steroidal anti-inflammatory drugs (NSAIDs) and supplements.  This website has more information on Eliquis (apixaban): http://[email or web address].com/eliquis/home

## 2019-05-08 NOTE — Consult Note (Addendum)
Cardiology Consultation:   Patient ID: Antonio Carlson: [id number]; DOB: 1945-10-23  Admit date: 05/01/2019 Date of Consult: 05/08/2019  Primary Care Provider: [name], MD Primary Cardiologist: Pixie Casino, MD  Primary Electrophysiologist:  None    Patient Profile:   Antonio Carlson is a 73 y.o. male with a hx of HTN, HL, PVCs, asthma, BPH, CAD now s/p CABG who is being seen today for the evaluation of post op Afib at the request of Dr. Orvan Seen.  History of Present Illness:   Antonio Carlson is a 73 yo male with PMH noted above. Followed by Dr. Debara Pickett as an outpatient.  He was seen in the office on 03/15/2019 complaining of exertional chest discomfort that improved with rest.  He underwent outpatient coronary CTA showing significant stenosis of 70% RCA, moderate to severe stenosis in the circumflex and moderate degree of stenosis in the LAD.  Study was sent for FFR showing significant distal LAD stenosis 0.76, stenosis of 0.68 in the left circumflex and 0.15 in the proximal RCA.  Calcium score significantly elevated at 1189.  He was sent for outpatient evaluation which was done by Dr. Tamala Julian.  Noted to have heavy calcification in the left coronary system with disease involving the RCA as well.  It was arranged for outpatient TCTS consultation for possible CABG.  He was seen as an outpatient on 04/19/2019 by Dr. Orvan Seen and felt to be a great candidate for surgery.   Underwent four-vessel CABG with Dr. Orvan Seen on 05/01/2019.  Of note was placed on Plavix postoperatively secondary to aspirin allergy.  Developed A. fib RVR on 10/8 and placed on IV amiodarone in addition to IV Lopressor.  Appeared to have continued episodes of PAF with RVR at times on the monitor.  He was transitioned to amiodarone 400 mg twice daily along with blood pressure 25 mg twice daily.  Started on apixaban 2.5 mg twice daily on 10/10.  His Lopressor was increased to 37.5 mg twice daily and then 50 mg twice daily in attempt  for better rate control.  Prior to TCTS will further increase apixaban to 5 mg twice daily.  Heart Pathway Score:     Past Medical History:  Diagnosis Date   Allergy    Anginal pain (HCC)    Arthritis    Asthma    Benign prostatic hypertrophy    Coronary artery disease    Diverticulosis    Dyspnea    GERD (gastroesophageal reflux disease)    History of kidney stones    Hyperlipidemia    Hypertension    Nephrolithiasis    PVC's (premature ventricular contractions)    Syncope     Past Surgical History:  Procedure Laterality Date   APPENDECTOMY     CORONARY ARTERY BYPASS GRAFT N/A 05/01/2019   Procedure: CORONARY ARTERY BYPASS GRAFTING (CABG) TIMES FOUR USING LEFT AND RIGHT INTERNAL MAMMARY ARTERIES AND RIGHT RADIAL ARTERY WITH STERNAL PLATING;  Surgeon: [name], MD;  Location: [facility];  Service: Open Heart Surgery;  Laterality: N/A;   LEFT HEART CATH AND CORONARY ANGIOGRAPHY N/A 04/18/2019   Procedure: LEFT HEART CATH AND CORONARY ANGIOGRAPHY;  Surgeon: Belva Crome, MD;  Location: [facility];  Service: Cardiovascular;  Laterality: N/A;   NASAL SINUS SURGERY  Sept 2012   RADIAL ARTERY HARVEST Right 05/01/2019   Procedure: Right Radial Artery Harvest;  Surgeon: [name], MD;  Location: [facility];  Service: Open Heart Surgery;  Laterality: Right;  TEE WITHOUT CARDIOVERSION N/A 05/01/2019   Procedure: TRANSESOPHAGEAL ECHOCARDIOGRAM (TEE);  Surgeon: [name], MD;  Location: [facility];  Service: Open Heart Surgery;  Laterality: N/A;     Home Medications:  Prior to Admission medications   Medication Sig Start Date End Date Taking? Authorizing Provider  calcium carbonate (OS-CAL) 1250 (500 Ca) MG chewable tablet Chew 1 tablet by mouth daily.   Yes [provider]  chlorpheniramine (CHLOR-TRIMETON) 4 MG tablet Take 4 mg by mouth 2 (two) times daily as needed for allergies.    Yes [provider]  ciprofloxacin (CIPRO) 500  MG tablet Take 1 tablet (500 mg total) by mouth 2 (two) times daily. 04/28/19  Yes Atkins, Glenice Bow, MD  fluticasone (FLONASE) 50 MCG/ACT nasal spray USE 1 SPRAY IN EACH NOSTRIL EVERY MORNING  AND AT BEDTIME AS NEEDED Patient taking differently: Place 1 spray into both nostrils at bedtime as needed for allergies.  06/23/16  Yes Parrett, Tammy S, NP  formoterol (PERFOROMIST) 20 MCG/2ML nebulizer solution Take 2 mLs (20 mcg total) by nebulization 2 (two) times daily. DX: [id number] 01/23/19  Yes [name], MD  montelukast (SINGULAIR) 10 MG tablet TAKE 1 TABLET BY MOUTH ONCE DAILY IN THE EVENING Patient taking differently: Take 10 mg by mouth every evening.  02/14/19  Yes [name], MD  Multiple Vitamins-Minerals (CENTRUM) tablet Take 1 tablet by mouth daily.     Yes [provider]  omeprazole (PRILOSEC) 20 MG capsule Take 1 capsule (20 mg total) by mouth daily. 04/13/18  Yes [name], MD  oxymetazoline (AFRIN) 0.05 % nasal spray Place 1 spray into the nose 2 (two) times daily as needed for congestion.    Yes [provider]  predniSONE (DELTASONE) 10 MG tablet Take 10-40 mg by mouth See admin instructions. Take 40 mg daily for 2 days, 20 mg daily for 2 days, then 10 mg daily for 2 days as needed for sinus polyp congestion   Yes [provider]  simvastatin (ZOCOR) 40 MG tablet TAKE 1 TABLET EVERY EVENING Patient taking differently: Take 40 mg by mouth at bedtime.  11/16/18  Yes [name], MD  acetaminophen (TYLENOL) 500 MG tablet Take 2 tablets (1,000 mg total) by mouth every 6 (six) hours as needed for moderate pain. 05/08/19   Nani Skillern, PA-C  acyclovir (ZOVIRAX) 5 % ointment Apply 1 application topically every 3 (three) hours as needed (cold sores). Apply as directed for cold sores    [provider]  albuterol (PROAIR HFA) 108 (90 Base) MCG/ACT inhaler 2 puffs every 4 hours as needed only  if your can't catch your breath Patient  taking differently: Inhale 2 puffs into the lungs every 4 (four) hours as needed for wheezing or shortness of breath.  08/04/17   [name], MD  amiodarone (PACERONE) 200 MG tablet Take 1 tablet (200 mg total) by mouth 2 (two) times daily. For one week;then take Amiodarone 200 mg daily thereafter 05/08/19   Nani Skillern, PA-C  apixaban (ELIQUIS) 5 MG TABS tablet Take 1 tablet (5 mg total) by mouth 2 (two) times daily. 05/08/19   Nani Skillern, PA-C  atorvastatin (LIPITOR) 40 MG tablet Take 1 tablet (40 mg total) by mouth daily at 6 PM. 05/08/19   Nani Skillern, PA-C  budesonide (PULMICORT) 0.25 MG/2ML nebulizer solution USE 1 VIAL IN NEBULIZER TWICE DAILY 05/02/19   [name], MD  clopidogrel (PLAVIX) 75 MG  tablet Take 1 tablet (75 mg total) by mouth daily. 05/08/19   Nani Skillern, PA-C  famotidine (PEPCID) 20 MG tablet Take 20 mg by mouth daily as needed for heartburn or indigestion.    [provider]  furosemide (LASIX) 40 MG tablet Take 1 tablet (40 mg total) by mouth daily. For 4 days then stop. 05/08/19   Nani Skillern, PA-C  guaiFENesin (MUCINEX) 600 MG 12 hr tablet Take 1 tablet (600 mg total) by mouth 2 (two) times daily as needed for cough or to loosen phlegm. 05/08/19   Nani Skillern, PA-C  isosorbide dinitrate (ISORDIL) 10 MG tablet Take 1 tablet (10 mg total) by mouth 3 (three) times daily. 05/08/19   Nani Skillern, PA-C  lisinopril (ZESTRIL) 10 MG tablet Take 1 tablet (10 mg total) by mouth daily. 05/08/19   Nani Skillern, PA-C  LORazepam (ATIVAN) 0.5 MG tablet Take 0.5 mg by mouth daily as needed for anxiety.    [provider]  metoprolol tartrate (LOPRESSOR) 50 MG tablet Take 1 tablet (50 mg total) by mouth 2 (two) times daily. 05/08/19   Nani Skillern, PA-C  potassium chloride (KLOR-CON) 20 MEQ tablet Take 1 tablet (20 mEq total) by mouth daily. 05/08/19   Nani Skillern, PA-C    traMADol (ULTRAM) 50 MG tablet Take 1 tablet (50 mg total) by mouth every 6 (six) hours as needed for moderate pain. 05/08/19   Nani Skillern, PA-C  rosuvastatin (CRESTOR) 20 MG tablet Take 1 tablet (20 mg total) by mouth at bedtime. 08/07/11 10/01/11  [name], MD    Inpatient Medications: Scheduled Meds:  amiodarone  400 mg Oral BID   apixaban  2.5 mg Oral BID   arformoterol  15 mcg Nebulization BID   atorvastatin  40 mg Oral q1800   budesonide  0.25 mg Nebulization BID   Chlorhexidine Gluconate Cloth  6 each Topical Daily   clopidogrel  75 mg Oral Daily   colchicine  0.6 mg Oral Daily   furosemide  40 mg Oral Daily   guaiFENesin  600 mg Oral BID   isosorbide dinitrate  10 mg Oral TID   lisinopril  10 mg Oral Daily   mouth rinse  15 mL Mouth Rinse BID   metoprolol tartrate  50 mg Oral BID   montelukast  10 mg Oral QPM   pantoprazole  40 mg Oral Daily   potassium chloride  30 mEq Oral Daily   Continuous Infusions:  amiodarone Stopped (05/05/19 1035)   PRN Meds: loperamide HCl, metoprolol tartrate, morphine injection, ondansetron (ZOFRAN) IV, oxyCODONE, traMADol  Allergies:    Allergies  Allergen Reactions   Aspirin Shortness Of Breath   Amoxicillin     GI upset Did it involve swelling of the face/tongue/throat, SOB, or low BP? No Did it involve sudden or severe rash/hives, skin peeling, or any reaction on the inside of your mouth or nose? No Did you need to seek medical attention at a hospital or doctor's office? No When did it last happen?20+ years If all above answers are "NO", may proceed with cephalosporin use.    Sulfonamide Derivatives Other (See Comments)    Doesn't remember    Social History:   Social History   Socioeconomic History   Marital status: Married    Spouse name: Not on file   Number of children: Not on file   Years of education: Not on file   Highest education level: Not on  file  Occupational  History   Not on file  Social Needs   Financial resource strain: Not on file   Food insecurity    Worry: Not on file    Inability: Not on file   Transportation needs    Medical: Not on file    Non-medical: Not on file  Tobacco Use   Smoking status: Former Smoker    Packs/day: 2.00    Years: 14.00    Pack years: 28.00    Quit date: 07/27/1969    Years since quitting: 49.8   Smokeless tobacco: Never Used  Substance and Sexual Activity   Alcohol use: Yes    Comment: beer occ   Drug use: Never   Sexual activity: Not on file  Lifestyle   Physical activity    Days per week: Not on file    Minutes per session: Not on file   Stress: Not on file  Relationships   Social connections    Talks on phone: Not on file    Gets together: Not on file    Attends religious service: Not on file    Active member of club or organization: Not on file    Attends meetings of clubs or organizations: Not on file    Relationship status: Not on file   Intimate partner violence    Fear of current or ex partner: Not on file    Emotionally abused: Not on file    Physically abused: Not on file    Forced sexual activity: Not on file  Other Topics Concern   Not on file  Social History Narrative   Not on file    Family History:    Family History  Problem Relation Age of Onset   Heart attack Father      ROS:  Please see the history of present illness.   All other ROS reviewed and negative.     Physical Exam/Data:   Vitals:   05/07/19 2008 05/07/19 2221 05/08/19 0600 05/08/19 0827  BP:  (!) 144/79  (!) 159/82  Pulse:  (!) 107  (!) 108  Resp:  18 20 (!) 22  Temp:  98.5 F (36.9 C) 98.2 F (36.8 C) (!) 97.4 F (36.3 C)  TempSrc:  Oral Oral Axillary  SpO2: 94% 93%  98%  Weight:   80 kg   Height:       No intake or output data in the 24 hours ending 05/08/19 0842 Last 3 Weights 05/08/2019 05/07/2019 05/06/2019  Weight (lbs) 176 lb 6.4 oz 179 lb 180 lb 3.2 oz  Weight  (kg) 80.015 kg 81.194 kg 81.738 kg     Body mass index is 26.82 kg/m.  General:  Well nourished, well developed, in no acute distress HEENT: normal Neck: no JVD Endocrine:  No thryomegaly Vascular: No carotid bruits; FA pulses 2+ bilaterally without bruits  Cardiac:  normal S1, S2; Tachy; no murmur  Lungs:  clear to auscultation bilaterally, no wheezing, rhonchi or rales  Abd: soft, nontender, no hepatomegaly  Ext: no edema Musculoskeletal:  No deformities, BUE and BLE strength normal and equal Skin: warm and dry  Neuro:  CNs 2-12 intact, no focal abnormalities noted Psych:  Normal affect   EKG:  The EKG was personally reviewed and demonstrates:  05/04/19 ST rate 116 bpm Telemetry:  Telemetry was personally reviewed and demonstrates:  Afib RVR   Relevant CV Studies:  TTE: 04/25/19  IMPRESSIONS    1. Left ventricular ejection fraction, by visual  estimation, is 60 to 65%. The left ventricle has normal function. Normal left ventricular size. There is no left ventricular hypertrophy.  2. Global right ventricle has normal systolic function.The right ventricular size is mildly enlarged. No increase in right ventricular wall thickness.  3. Left atrial size was normal.  4. Right atrial size was normal.  5. Mild mitral annular calcification.  6. The mitral valve is normal in structure. No evidence of mitral valve regurgitation. No evidence of mitral stenosis.  7. The tricuspid valve is normal in structure. Tricuspid valve regurgitation was not visualized by color flow Doppler.  8. The aortic valve is normal in structure. Aortic valve regurgitation is trivial by color flow Doppler. Structurally normal aortic valve, with no evidence of sclerosis or stenosis.  9. The pulmonic valve was normal in structure. Pulmonic valve regurgitation is not visualized by color flow Doppler. 10. The inferior vena cava is normal in size with greater than 50% respiratory variability, suggesting right atrial  pressure of 3 mmHg.  Laboratory Data:  High Sensitivity Troponin:  No results for input(s): TROPONINIHS in the last 720 hours.   Chemistry Recent Labs  Lab 05/02/19 1747 05/03/19 0548 05/07/19 0739  NA 135 134* 140  K 4.0 4.2 3.9  CL 101 102 101  CO2 26 26 27   GLUCOSE 139* 121* 91  BUN 18 18 20   CREATININE 1.07 0.85 0.94  CALCIUM 8.2* 7.8* 8.9  GFRNONAA >60 >60 >60  GFRAA >60 >60 >60  ANIONGAP 8 6 12     No results for input(s): PROT, ALBUMIN, AST, ALT, ALKPHOS, BILITOT in the last 168 hours. Hematology Recent Labs  Lab 05/02/19 0403 05/02/19 1747 05/03/19 0548  WBC 15.8* 16.9* 15.4*  RBC 4.34 4.09* 3.93*  HGB 12.3*   12.2* 12.0* 11.5*  HCT 38.1*   36.0* 35.9* 35.0*  MCV 87.8 87.8 89.1  MCH 28.3 29.3 29.3  MCHC 32.3 33.4 32.9  RDW 14.5 14.9 14.8  PLT 188 168 163   BNPNo results for input(s): BNP, PROBNP in the last 168 hours.  DDimer No results for input(s): DDIMER in the last 168 hours.   Radiology/Studies:  Dg Chest 2 View  Result Date: 05/05/2019 CLINICAL DATA:  Pleural effusion. Recent CABG. EXAM: CHEST - 2 VIEW COMPARISON:  05/03/2019 FINDINGS: Sequelae of CABG are again identified. A right jugular sheath remains in place. The cardiomediastinal silhouette is unchanged. Lung volumes are increased compared to the prior portable study. There are small bilateral pleural effusions with associated mild left greater than right basilar lung opacities likely reflecting atelectasis. No pneumothorax is identified. No acute osseous abnormality is seen. IMPRESSION: Small bilateral pleural effusions and basilar atelectasis. Electronically Signed   By: Logan Bores M.D.   On: 05/05/2019 10:07    Assessment and Plan:   JAESEAN OLSTAD is a 73 y.o. male with a hx of HTN, HL, PVCs, asthma, BPH, CAD now s/p CABG who is being seen today for the evaluation of post op Afib at the request of Dr. Orvan Seen.  1. Post op Afib RVR: Placed on IV amiodarone, now transitioned to 400mg  BID  (started on 10/9). Lopressor has been titrated to 50mg  BID with blood pressures tolerating. Back in Afib RVR this morning, overall stable just feels some fluttering. Consider rebolus of IV amiodarone 150mg  and further increase of lopressor to 75mg  daily. If unable to control rate may need to consider cardioversion, would need a TEE. Has been on Eliquis 2.5mg  BID, TCTS noted plan to increase to  5mg  BID. Will review plan with MD. This patients CHA2DS2-VASc Score of at least 3.  2. CAD s/p 4vCABG: with Dr. Orvan Seen on 10/5. Overall progressing well other than Afib. Medical therapy includes plavix, statin, BB and ACEi. No ASA 2/2 to allergy  3. HTN: blood pressures are borderline control. Could tolerate BB increase as above.   4. HL: on statin.  For questions or updates, please contact [facility] Please consult [email or web address] for contact info under   Signed, Reino Bellis, NP  05/08/2019 8:42 AM   I have examined the patient and reviewed assessment and plan and discussed with patient.  Agree with above as stated.  Tele reviewd.  AFib with RVR.  Currently appears to be in atrial flutter.  GIven another dose of IV Amio and titrate metoprolol.   Responded briefly to carotid massage, but then HR came back up.    Increasing anticoagulation gradually.  Cannot go home unless his HR has improved. I explained this to him and he understood.  Since he will need Eliquis, will have to be careful regarding Plavix as well.  No aspirin due to allergy.   Larae Grooms

## 2019-05-08 NOTE — Care Management Important Message (Signed)
Important Message  Patient Details  Name: Antonio Carlson MRN: [id number] Date of Birth: Jun 30, 1946   Medicare Important Message Given:  Yes     Shelda Altes 05/08/2019, 2:36 PM

## 2019-05-08 NOTE — Progress Notes (Signed)
CARDIAC REHAB PHASE I   Offered to walk with pt. Pt declining at this time stating he was told not to walk this morning. HR SR 90s. Told pt and wife I will come back later and see how he's doing.   Rufina Falco, RN BSN 05/08/2019 11:03 AM

## 2019-05-08 NOTE — Progress Notes (Signed)
CARDIAC REHAB PHASE I   PRE:  Rate/Rhythm: 43 SR with occ PVC  BP:  Sitting: 121/84      SaO2: 97 RA  MODE:  Ambulation: 470 ft   POST:  Rate/Rhythm: 104 ST with PVCs  BP:  Sitting: 128/74    SaO2: 98 RA  Pt ambulated 453ft in hallway standby assist with front wheel walker. Pt looks weaker and more SOB today. Pt states he did not walk yesterday. Stressed importance of some ambulation daily, even on days he doesn't feel as good. Pt returned to recliner, pt able to demonstrate 1000 on IS. Encouraged continued IS use and walks. Will continue to follow.  [id number] Rufina Falco, RN BSN 05/08/2019 1:42 PM

## 2019-05-09 DIAGNOSIS — I480 Paroxysmal atrial fibrillation: Secondary | ICD-10-CM

## 2019-05-09 MED ORDER — APIXABAN 5 MG PO TABS: 5.0000 mg | ORAL_TABLET | Freq: Two times a day (BID) | ORAL | 1 refills | Status: DC

## 2019-05-09 MED FILL — ELIQUIS 5 MG TABLET: 5 | 30 days supply | Qty: 60 | Fill #0

## 2019-05-09 NOTE — Progress Notes (Signed)
Patient seen by cardiology. He remains in SR with HR int he 80's. Per cardiology,ok to discharge.

## 2019-05-09 NOTE — Progress Notes (Addendum)
Progress Note  Patient Name: Antonio Carlson Date of Encounter: 05/09/2019  Primary Cardiologist: Pixie Casino, MD   Subjective   Feeling well this morning. Hopeful to go home.   Inpatient Medications    Scheduled Meds:  amiodarone  400 mg Oral BID   apixaban  2.5 mg Oral BID   arformoterol  15 mcg Nebulization BID   atorvastatin  40 mg Oral q1800   budesonide  0.25 mg Nebulization BID   Chlorhexidine Gluconate Cloth  6 each Topical Daily   clopidogrel  75 mg Oral Daily   colchicine  0.6 mg Oral Daily   furosemide  40 mg Oral Daily   guaiFENesin  600 mg Oral BID   isosorbide dinitrate  10 mg Oral TID   lisinopril  10 mg Oral Daily   mouth rinse  15 mL Mouth Rinse BID   metoprolol tartrate  50 mg Oral BID   montelukast  10 mg Oral QPM   pantoprazole  40 mg Oral Daily   potassium chloride  30 mEq Oral Daily   Continuous Infusions:  PRN Meds: loperamide HCl, metoprolol tartrate, morphine injection, ondansetron (ZOFRAN) IV, oxyCODONE, traMADol   Vital Signs    Vitals:   05/09/19 0200 05/09/19 0230 05/09/19 0455 05/09/19 0841  BP: 113/64 119/74 127/83 137/81  Pulse:   82 92  Resp: 19 19 20  (!) 23  Temp:   97.9 F (36.6 C) 97.7 F (36.5 C)  TempSrc:   Oral Axillary  SpO2:   98% 98%  Weight:   79.8 kg   Height:        Intake/Output Summary (Last 24 hours) at 05/09/2019 0854 Last data filed at 05/09/2019 0500 Gross per 24 hour  Intake 240 ml  Output 750 ml  Net -510 ml   Last 3 Weights 05/09/2019 05/08/2019 05/07/2019  Weight (lbs) 175 lb 14.4 oz 176 lb 6.4 oz 179 lb  Weight (kg) 79.788 kg 80.015 kg 81.194 kg      Telemetry    SR with PVCs - Personally Reviewed  ECG    No new tracing  Physical Exam  Pleasant older WM GEN: No acute distress.   Neck: No JVD Cardiac: RRR, no murmurs, rubs, or gallops.  Respiratory: Clear to auscultation bilaterally. GI: Soft, nontender, non-distended  MS: No edema; No deformity. Neuro:   Nonfocal  Psych: Normal affect   Labs    High Sensitivity Troponin:  No results for input(s): TROPONINIHS in the last 720 hours.    Chemistry Recent Labs  Lab 05/02/19 1747 05/03/19 0548 05/07/19 0739  NA 135 134* 140  K 4.0 4.2 3.9  CL 101 102 101  CO2 26 26 27   GLUCOSE 139* 121* 91  BUN 18 18 20   CREATININE 1.07 0.85 0.94  CALCIUM 8.2* 7.8* 8.9  GFRNONAA >60 >60 >60  GFRAA >60 >60 >60  ANIONGAP 8 6 12      Hematology Recent Labs  Lab 05/02/19 1747 05/03/19 0548  WBC 16.9* 15.4*  RBC 4.09* 3.93*  HGB 12.0* 11.5*  HCT 35.9* 35.0*  MCV 87.8 89.1  MCH 29.3 29.3  MCHC 33.4 32.9  RDW 14.9 14.8  PLT 168 163    BNPNo results for input(s): BNP, PROBNP in the last 168 hours.   DDimer No results for input(s): DDIMER in the last 168 hours.   Radiology    No results found.  Cardiac Studies   TTE: 04/25/19  IMPRESSIONS   1. Left ventricular ejection fraction, by  visual estimation, is 60 to 65%. The left ventricle has normal function. Normal left ventricular size. There is no left ventricular hypertrophy. 2. Global right ventricle has normal systolic function.The right ventricular size is mildly enlarged. No increase in right ventricular wall thickness. 3. Left atrial size was normal. 4. Right atrial size was normal. 5. Mild mitral annular calcification. 6. The mitral valve is normal in structure. No evidence of mitral valve regurgitation. No evidence of mitral stenosis. 7. The tricuspid valve is normal in structure. Tricuspid valve regurgitation was not visualized by color flow Doppler. 8. The aortic valve is normal in structure. Aortic valve regurgitation is trivial by color flow Doppler. Structurally normal aortic valve, with no evidence of sclerosis or stenosis. 9. The pulmonic valve was normal in structure. Pulmonic valve regurgitation is not visualized by color flow Doppler. 10. The inferior vena cava is normal in size with greater than 50%  respiratory variability, suggesting right atrial pressure of 3 mmHg.  Patient Profile     73 y.o. male  with a hx of HTN, HL, PVCs, asthma, BPH, CAD now s/p CABG who is being seen today for the evaluation of post op Afib at the request of Dr. Orvan Seen.  Assessment & Plan    1. Post op Afib RVR: Placed on IV amiodarone, now transitioned to 400mg  BID (started on 10/9). Lopressor has been titrated to 50mg  BID with blood pressures tolerating. Was back in Afib RVR yesterday. Given bolus of IV amiodarone 150mg  with conversion to SR. Maintaining SR this morning.  -- Has been on Eliquis 2.5mg  BID, TCTS noted plan to increase to 5mg  BID. This patients CHA2DS2-VASc Score of at least 3.  2. CAD s/p 4vCABG: with Dr. Orvan Seen on 10/5. Overall progressing well other than Afib. Medical therapy includes plavix, statin, BB and ACEi. No ASA 2/2 to allergy  3. HTN: blood pressures controlled with current therapy.   4. HL: on statin.  CARDIOLOGY RECOMMENDATIONS:  Discharge is anticipated in the next 48 hours. Recommendations for medications and follow up:  Discharge Medications:  Continue medications as they are currently [facility]. Exceptions to the above: None  Follow Up: The patient's Primary Cardiologist is Pixie Casino, MD   Follow up in the office in 2 week(s) has been arranged.  Signed,  Reino Bellis, NP  8:59 AM 05/09/2019  [facility]  I have examined the patient and reviewed assessment and plan and discussed with patient.  Agree with above as stated.    Back in NSR.  COntinue metoprolol and Amio.  Hopefully, Amio can be decreased to 400 daily in a week or so.  Anticoagulation per TCTS given that he is post CABG.  No aspirin.  If AFib does not recur, may be able to stop Eliquis.  Certainly, if he has any bleeding issues, will have to reconsider anticoagulation.   OK to discharge from a cardiology standpoint.  Larae Grooms

## 2019-05-09 NOTE — TOC Transition Note (Signed)
Transition of Care [facility]) - CM/SW Discharge Note Marvetta Gibbons RN, BSN Transitions of Care Unit 4E- RN Case Manager [phone]   Patient Details  Name: Antonio Carlson MRN: [id number] Date of Birth: 03-Oct-1945  Transition of Care [facility]) CM/SW Contact:  Dawayne Patricia, RN Phone Number: 05/09/2019, 1:51 PM   Clinical Narrative:    Pt stable for transition home s/p CABG. Follow up done regarding referral on medication needs. Benefits check completed on new meds with copay cost. Reviewed info with pt on each med and cost. Verified pharmacy info with pt. [facility] has delivered first month of Eliquis to the bedside under 30 day free card. No further needs noted for transition home.    Final next level of care: Home/Self Care Barriers to Discharge: No Barriers Identified   Patient Goals and CMS Choice Patient states their goals for this hospitalization and ongoing recovery are:: home   Choice offered to / list presented to : NA  Discharge Placement             Home          Discharge Plan and Services   Discharge Planning Services: CM Consult Post Acute Care Choice: NA          DME Arranged: N/A DME Agency: NA         HH Agency: NA        Social Determinants of Health ([place]) Interventions     Readmission Risk Interventions Readmission Risk Prevention Plan 05/09/2019  Transportation Screening Complete  PCP or Specialist Appt within 5-7 Days Complete  Home Care Screening Complete  Medication Review (RN CM) Complete  Some recent data might be hidden

## 2019-05-09 NOTE — Progress Notes (Signed)
      Antonio Carlson [address]       [place] [address]             [phone]        8 Days Post-Op Procedure(s) (LRB): CORONARY ARTERY BYPASS GRAFTING (CABG) TIMES FOUR USING LEFT AND RIGHT INTERNAL MAMMARY ARTERIES AND RIGHT RADIAL ARTERY WITH STERNAL PLATING (N/A) TRANSESOPHAGEAL ECHOCARDIOGRAM (TEE) (N/A) INDOCYANINE GREEN FLUORESCENCE IMAGING (ICG) (N/A) Right Radial Artery Harvest (Right)  Subjective: Patient has not had any further diarrhea. He actually feels fairly well this am.  Objective: Vital signs in last 24 hours: Temp:  [97.4 F (36.3 C)-98.2 F (36.8 C)] 97.9 F (36.6 C) (10/13 0455) Pulse Rate:  [82-144] 82 (10/13 0455) Cardiac Rhythm: Normal sinus rhythm (10/12 2000) Resp:  [15-29] 20 (10/13 0455) BP: (98-159)/(62-110) 127/83 (10/13 0455) SpO2:  [98 %-100 %] 98 % (10/13 0455) Weight:  [79.8 kg] 79.8 kg (10/13 0455)  Pre op weight 81 kg Current Weight  05/09/19 79.8 kg       Intake/Output from previous day: 10/12 0701 - 10/13 0700 In: 360 [P.O.:360] Out: 750 [Urine:750]   Physical Exam:  Cardiovascular: RRR Pulmonary: Slightly diminished at bases Abdomen: Soft, non tender, bowel sounds present. Extremities: Trace bilateral lower extremity edema. Varicosities LEs Wounds:  Wounds are clean and dry.  No erythema or signs of infection. Motor/sensory intact RUE  Lab Results: CBC: No results for input(s): WBC, HGB, HCT, PLT in the last 72 hours. BMET:  Recent Labs    05/07/19 0739  NA 140  K 3.9  CL 101  CO2 27  GLUCOSE 91  BUN 20  CREATININE 0.94  CALCIUM 8.9    PT/INR:  Lab Results  Component Value Date   INR 1.4 (H) 05/01/2019   INR 1.0 04/27/2019   INR 0.9 05/10/2008   ABG:  INR: Will add last result for INR, ABG once components are confirmed Will add last 4 CBG results once components are confirmed  Assessment/Plan:  1. CV - PAF with RVR, PVCs.. SR with HR in the 80's this am. On Amiodarone 400 mg bid and  Lopressor 50 mg bid, Lisinopril 10 mg daily, and low dose Apixaban. Per pharmacy, he should be on 5 mg bid. As discussed with Dr. Orvan Seen, will discharge on Apixaban 5 mg bid. Appreciated cardiology's assistance 2.  Pulmonary - On room air.  Continue Pulmicort and Brovana. Mucinex for cough 3. Volume Overload - On oral Lasix 4.  Acute blood loss anemia - Last H and H  11.5 and 35 6. GI-monitor nausea. He was given a few doses of Reglan previously. Hopefully, appetite will improve with time and decreased Amiodarone. 7.Chest tube sutures to remain as will remove in office on Friday 10/16.  8. Will be ready for discharge once a fib/fl HR better controlled.  Antonio M ZimmermanPA-C 05/09/2019,7:12 AM [phone]

## 2019-05-09 NOTE — Progress Notes (Signed)
Per consult regarding medications and f/u on benefits check for new medications  # 1. S/W [facility] @ [facility] # [phone] OPT- 2   1. AMIODARONE TABLET 400 MH BID  AND PACERONE 400 MG BID  COVER- YES  CO-PAY- $114.49  TIER- 4 DRUG  PRIOR APPROVAL- NO   2. ELIQUIS 2.5 MG BID AND ELIQUIS 5 MG BID  COVER YES  CO-PAY- $43.00  TIER- 3 DRUG  PRIOR APPROVAL- NO   3. ARFORMOTEROL NEBULIZER SOLUTION 15 MCG  NON-FORMULARY  PRIOR APPROVAL- YES # [phone]   BROVANA 15 MCG  COVER- YES  CO-PAY- $136.88  TIER- 5 DRUG  PRIOR APPROVAL - YES [id number] OPT- 2   4. ATORVASTATIN TABLET 40 MG DAILY  COVER- YES  CO-PAY- ZERO DOLLARS  TIER- 1 DRUG  PRIOR APPROVAL- NO   LIPITOR 40 MG DAILY  NON-FORMULARY  PRIOR APPROVAL- YES # [phone] OPT- 2   5. CLOPIDOGREL 75 MG DAILY  COVER- YES  CO-PAY- ZERO DOLLARS  TIER- 1 DRUG  PRIOR APPROVAL- NO   PLAVIX TABLET 75 MG DAILY  NON-FORMULARY  PRIOR APPROVAL -YES [phone] OPT- 2   6. COLCHICINE TABLET 0.6 MG DAILY  NON-FORMULARY  PRIOR APPROVAL- YES # [phone] OPT- 2   ALTERNATIVE :  1. COLCRYS 0.6 MG DAILY  COVER- YES  CO-PAY- $43.00  TIER- 3 DRUG  PRIOR APPROVAL- NO   2 ALLOPURINOL   COVER- YES  CO-PAY- ZERO DOLLARS  TIER- 1 DRUG  PRIOR APPROVAL- NO   7. ISOSORBIDE DINITRATE TABLET 10 MG 3 TIME DAILY  COVER- YES  CO-PAY- $43.00  TIER- 3 DRUG  PRIOR APPROVAL- NO   ISORDIL TABLET 10 MG 3 TIME DAILY  NON-FORMULARY  PRIOR APPROVAL- YES # [phone] OPT- 2   8. FUROSEMIDE 40 MG DAILY  COVER- YES  CO-PAY- ZERO DOLLARS  TIER- 1 DRUG  PRIOR APPROVAL- NO   LASIX 40 MG DAILY  NON-FORMULARY  PRIOR APPROVAL- YES # [phone] OPT- 2   NO DEDUCTIBLE   PREFERRED PHARMACY : YES  [facility]

## 2019-05-09 NOTE — Progress Notes (Signed)
CARDIAC REHAB PHASE I   PRE:  Rate/Rhythm: 85 SR  BP:  Sitting: 114/72      SaO2: 92 RA  MODE:  Ambulation: 450 ft   POST:  Rate/Rhythm: 102 ST  BP:  Sitting: 150/77    SaO2: 94 RA  Pt ambulated 460ft in hallway assist of one with front wheel walker and gait belt. Pt took two standing rest breaks c/o weakness in his legs. Pt returned to recliner, call bell, phone and bedside table within reach. Pt hopeful for d/c later today but understands if he is not able to.  [id number] Rufina Falco, RN BSN 05/09/2019 10:16 AM

## 2019-05-10 ENCOUNTER — Telehealth (HOSPITAL_COMMUNITY): Payer: Self-pay

## 2019-05-10 NOTE — Telephone Encounter (Signed)
Pt insurance is active and benefits verified through Medicare A/B. Co-pay $0.00, DED $198.00/$198.00 met, out of pocket $0.00/$0.00 met, co-insurance 20%. No pre-authorization required. Passport, 05/10/2019 @ 4:22PM, OEH#[id number]  2ndary insurance is active and benefits verified through [employer] of [place]. Co-pay $0.00, DED $0.00/$0.00 met, out of pocket $0.00/$0.00 met, co-insurance 0%. No pre-authorization required. Passport, 05/10/2019 @ 4:26PM, QBV#[id number]  Will contact patient to see if he is interested in the Cardiac Rehab Program. If interested, patient will need to complete follow up appt. Once completed, patient will be contacted for scheduling upon review by the RN Navigator.

## 2019-05-11 ENCOUNTER — Other Ambulatory Visit: Payer: Self-pay | Admitting: Cardiothoracic Surgery

## 2019-05-11 DIAGNOSIS — Z951 Presence of aortocoronary bypass graft: Secondary | ICD-10-CM

## 2019-05-12 ENCOUNTER — Ambulatory Visit
Admission: RE | Admit: 2019-05-12 | Discharge: 2019-05-12 | Disposition: A | Discharge status: Home / Self Care | Payer: Medicare Other | Source: Ambulatory Visit | Attending: Cardiothoracic Surgery | Admitting: Cardiothoracic Surgery

## 2019-05-12 ENCOUNTER — Ambulatory Visit (INDEPENDENT_AMBULATORY_CARE_PROVIDER_SITE_OTHER): Payer: Self-pay | Admitting: Cardiothoracic Surgery

## 2019-05-12 ENCOUNTER — Other Ambulatory Visit: Payer: Self-pay

## 2019-05-12 VITALS — BP 138/71 | HR 86 | Temp 97.7°F | Resp 20 | Ht 68.0 in | Wt 179.0 lb

## 2019-05-12 DIAGNOSIS — I2510 Atherosclerotic heart disease of native coronary artery without angina pectoris: Secondary | ICD-10-CM

## 2019-05-12 DIAGNOSIS — J90 Pleural effusion, not elsewhere classified: Secondary | ICD-10-CM | POA: Diagnosis not present

## 2019-05-12 DIAGNOSIS — Z951 Presence of aortocoronary bypass graft: Secondary | ICD-10-CM

## 2019-05-13 NOTE — Progress Notes (Signed)
[address]       [place] [address]             [phone]     CARDIOTHORACIC SURGERY OFFICE NOTE  Referring Provider is Hilty, Nadean Corwin, MD Primary Cardiologist is Pixie Casino, MD PCP is [name], MD   HPI:  73 yo man underwent CABG x 4 on 05/01/19. His hospital stay was prolonged by an extra couple days due to AF. This appears well-controlled now. He returns for 1st outpatient visit. Has no complaints of chest pain, but does not some back pain and questions whether this could be related to pre-op urinalysis suggesting UTI. We treated him with ciprofloxacin for a few days before surgery and there were no issues with this after surgery.    Current Outpatient Medications  Medication Sig Dispense Refill  . acetaminophen (TYLENOL) 500 MG tablet Take 2 tablets (1,000 mg total) by mouth every 6 (six) hours as needed for moderate pain. 30 tablet 0  . acyclovir (ZOVIRAX) 5 % ointment Apply 1 application topically every 3 (three) hours as needed (cold sores). Apply as directed for cold sores    . albuterol (PROAIR HFA) 108 (90 Base) MCG/ACT inhaler 2 puffs every 4 hours as needed only  if your can't catch your breath (Patient taking differently: Inhale 2 puffs into the lungs every 4 (four) hours as needed for wheezing or shortness of breath. ) 1 Inhaler 11  . amiodarone (PACERONE) 200 MG tablet Take 1 tablet (200 mg total) by mouth 2 (two) times daily. For one week;then take Amiodarone 200 mg daily thereafter 60 tablet 1  . apixaban (ELIQUIS) 5 MG TABS tablet Take 1 tablet (5 mg total) by mouth 2 (two) times daily. 60 tablet 1  . atorvastatin (LIPITOR) 40 MG tablet Take 1 tablet (40 mg total) by mouth daily at 6 PM. 30 tablet 1  . budesonide (PULMICORT) 0.25 MG/2ML nebulizer solution USE 1 VIAL IN NEBULIZER TWICE DAILY 120 mL 11  . calcium carbonate (OS-CAL) 1250 (500 Ca) MG chewable tablet Chew 1 tablet by mouth daily.    . chlorpheniramine (CHLOR-TRIMETON)  4 MG tablet Take 4 mg by mouth 2 (two) times daily as needed for allergies.     [name] Kitchen clopidogrel (PLAVIX) 75 MG tablet Take 1 tablet (75 mg total) by mouth daily. 30 tablet 1  . famotidine (PEPCID) 20 MG tablet Take 20 mg by mouth daily as needed for heartburn or indigestion.    . fluticasone (FLONASE) 50 MCG/ACT nasal spray USE 1 SPRAY IN EACH NOSTRIL EVERY MORNING  AND AT BEDTIME AS NEEDED (Patient taking differently: Place 1 spray into both nostrils at bedtime as needed for allergies. ) 48 g 1  . formoterol (PERFOROMIST) 20 MCG/2ML nebulizer solution Take 2 mLs (20 mcg total) by nebulization 2 (two) times daily. 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 furosemide (LASIX) 40 MG tablet Take 1 tablet (40 mg total) by mouth daily. For 4 days then stop. 4 tablet 0  . guaiFENesin (MUCINEX) 600 MG 12 hr tablet Take 1 tablet (600 mg total) by mouth 2 (two) times daily as needed for cough or to loosen phlegm.    . isosorbide dinitrate (ISORDIL) 10 MG tablet Take 1 tablet (10 mg total) by mouth 3 (three) times daily. 90 tablet 0  . lisinopril (ZESTRIL) 10 MG tablet Take 1 tablet (10 mg total) by mouth daily. 30 tablet 1  . LORazepam (ATIVAN) 0.5 MG  tablet Take 0.5 mg by mouth daily as needed for anxiety.    . metoprolol tartrate (LOPRESSOR) 50 MG tablet Take 1 tablet (50 mg total) by mouth 2 (two) times daily. 90 tablet 1  . montelukast (SINGULAIR) 10 MG tablet TAKE 1 TABLET BY MOUTH ONCE DAILY IN THE EVENING (Patient taking differently: Take 10 mg by mouth every evening. ) 90 tablet 1  . Multiple Vitamins-Minerals (CENTRUM) tablet Take 1 tablet by mouth daily.      [name] Kitchen omeprazole (PRILOSEC) 20 MG capsule Take 1 capsule (20 mg total) by mouth daily. 90 capsule 3  . oxymetazoline (AFRIN) 0.05 % nasal spray Place 1 spray into the nose 2 (two) times daily as needed for congestion.     . potassium chloride (KLOR-CON) 20 MEQ tablet Take 1 tablet (20 mEq total) by mouth daily. 4 tablet 0  . traMADol (ULTRAM) 50 MG tablet Take 1 tablet  (50 mg total) by mouth every 6 (six) hours as needed for moderate pain. 30 tablet 0   No current facility-administered medications for this visit.       Physical Exam:   BP 138/71   Pulse 86   Temp 97.7 F (36.5 C) (Skin)   Resp 20   Ht 5\' 8"  (1.727 m)   Wt 81.2 kg   SpO2 92% Comment: AR  BMI 27.22 kg/m   General:  Well-appearing, NAD  Chest:   cta  CV:   rrr  Incisions:  Well-healed  Abdomen:  sntnd  Extremities:  No edema ; well-healed R radial incision  Diagnostic Tests:  CXR with clear lung fields and minimal pulm edema  Impression:  Doing well after CABG  Plan:  F/u in 3-4 weeks F/u with Dr. Debara Pickett in the meantime  I spent in excess of 20 minutes during the conduct of this office consultation and >50% of this time involved direct face-to-face encounter with the patient for counseling and/or coordination of their care.  Level 2                 10 minutes Level 3                 15 minutes Level 4                 25 minutes Level 5                 40 minutes  Marissa Weaver Z. Orvan Seen, MD [phone] 05/13/2019 11:48 AM

## 2019-05-15 ENCOUNTER — Telehealth: Payer: Self-pay

## 2019-05-15 NOTE — Telephone Encounter (Signed)
Pt's wife called office, asking about scheduling of cardiac rehabilitation. Informed her that someone should be calling to set this up, but gave her the cardiac rehab phone number to inquire. Wife also asking when pt can drive again. Informed her that this could be discussed at pt's postop visit w/ Dr. Orvan Seen on 06/02/19. Wife reports pt is constipated and states she gave him Miralax last night. Also suggested that pt take an OTC stool softener. Advised to call w/ any further concerns.

## 2019-05-23 DIAGNOSIS — Z006 Encounter for examination for normal comparison and control in clinical research program: Secondary | ICD-10-CM

## 2019-05-23 NOTE — Progress Notes (Addendum)
Cardiology Office Note:    Date:  05/25/2019   ID:  Antonio Carlson, DOB 03/22/1946, MRN [id number]  PCP:  [name], MD  Cardiologist:  Pixie Casino, MD   Referring MD: [name], MD   Follow-up post CABG  History of Present Illness:    Antonio Carlson is a 72 y.o. male with a hx of CAD,CABG x4 on 05/01/2019, hypertension, syncope, angina pectoris, asthma, URI, colon polyps, rectal bleeding, osteopenia, cervical neck pain, nephrolithiasis, hyperlipidemia, and left hip pain.   He was seen virtually on 04/11/2019 by Dr. Debara Pickett  as a follow-up appointment for results from his abnormal coronary artery CT . He underwent cardiac catheterization on 04/18/2019 which showed multivessel disease.  TCTS was consulted for coronary bypass grafting and he underwent CABG x4 on 05/01/2019 (LIMA to LAD,RIMA to PDA, right radial to distal obtuse marginal, and ramus intermediate of left circumflex coronary artery.)  On 05/03/2019 he went into atrial fibrillation with RVR received IV amiodarone and was converted to SR.  He was placed on apixaban 5 mg and p.o. amiodarone.  He was doing well and discharged from the hospital on 05/09/2019, presented to his postop follow-up visit with Dr. Orvan Seen on 10/16 and was doing well at that time.  He presents the clinic today and states he feels well.  He is moving around his house and walking 1-2 times per day.  He is unable to quantify how much walking he does daily but states he is regularly ambulating throughout his house.  He states he feels like his incisions have healed well.  He is finally sleeping better as he is now able to turn to his side and have less pain.  His medications  reviewed and he is unsure if he is still taking potassium or not.  Is currently taking 200 mg daily of amiodarone and 5 mg twice daily of his Eliquis.  He is planning on taking a vacation to his [place] home after Christmas.  Note that he needs to be seen by Dr. Debara Pickett prior to his [place]  vacation.  Ordered BMP today to evaluate electrolyte.  I will have him return for lipid panel and LFTs around November 30.  He denies chest pain, increased shortness of breath, lower extremity edema, fatigue, palpitations, melena, hematuria, hemoptysis, diaphoresis, weakness, presyncope, syncope, orthopnea, and PND.   Past Medical History:  Diagnosis Date   Allergy    Anginal pain ([place])    Arthritis    Asthma    Benign prostatic hypertrophy    Coronary artery disease    Diverticulosis    Dyspnea    GERD (gastroesophageal reflux disease)    History of kidney stones    Hyperlipidemia    Hypertension    Nephrolithiasis    PVC's (premature ventricular contractions)    Syncope     Past Surgical History:  Procedure Laterality Date   APPENDECTOMY     CORONARY ARTERY BYPASS GRAFT N/A 05/01/2019   Procedure: CORONARY ARTERY BYPASS GRAFTING (CABG) TIMES FOUR USING LEFT AND RIGHT INTERNAL MAMMARY ARTERIES AND RIGHT RADIAL ARTERY WITH STERNAL PLATING;  Surgeon: [name], MD;  Location: [facility];  Service: Open Heart Surgery;  Laterality: N/A;   LEFT HEART CATH AND CORONARY ANGIOGRAPHY N/A 04/18/2019   Procedure: LEFT HEART CATH AND CORONARY ANGIOGRAPHY;  Surgeon: Belva Crome, MD;  Location: [facility];  Service: Cardiovascular;  Laterality: N/A;   NASAL SINUS SURGERY  Sept 2012  RADIAL ARTERY HARVEST Right 05/01/2019   Procedure: Right Radial Artery Harvest;  Surgeon: [name], MD;  Location: [facility];  Service: Open Heart Surgery;  Laterality: Right;   TEE WITHOUT CARDIOVERSION N/A 05/01/2019   Procedure: TRANSESOPHAGEAL ECHOCARDIOGRAM (TEE);  Surgeon: [name], MD;  Location: [facility];  Service: Open Heart Surgery;  Laterality: N/A;    Current Medications: Current Meds  Medication Sig   acetaminophen (TYLENOL) 500 MG tablet Take 2 tablets (1,000 mg total) by mouth every 6 (six) hours as needed for moderate pain.   albuterol (PROAIR HFA)  108 (90 Base) MCG/ACT inhaler 2 puffs every 4 hours as needed only  if your can't catch your breath (Patient taking differently: Inhale 2 puffs into the lungs every 4 (four) hours as needed for wheezing or shortness of breath. )   amiodarone (PACERONE) 200 MG tablet Take 1 tablet (200 mg total) by mouth daily.   apixaban (ELIQUIS) 5 MG TABS tablet Take 1 tablet (5 mg total) by mouth 2 (two) times daily.   atorvastatin (LIPITOR) 40 MG tablet Take 1 tablet (40 mg total) by mouth daily at 6 PM.   budesonide (PULMICORT) 0.25 MG/2ML nebulizer solution USE 1 VIAL IN NEBULIZER TWICE DAILY   clopidogrel (PLAVIX) 75 MG tablet Take 1 tablet (75 mg total) by mouth daily.   fluticasone (FLONASE) 50 MCG/ACT nasal spray USE 1 SPRAY IN EACH NOSTRIL EVERY MORNING  AND AT BEDTIME AS NEEDED (Patient taking differently: Place 1 spray into both nostrils at bedtime as needed for allergies. )   formoterol (PERFOROMIST) 20 MCG/2ML nebulizer solution Take 2 mLs (20 mcg total) by nebulization 2 (two) times daily. DX: J45.909   guaiFENesin (MUCINEX) 600 MG 12 hr tablet Take 1 tablet (600 mg total) by mouth 2 (two) times daily as needed for cough or to loosen phlegm.   isosorbide dinitrate (ISORDIL) 10 MG tablet Take 1 tablet (10 mg total) by mouth 3 (three) times daily.   lisinopril (ZESTRIL) 10 MG tablet Take 1 tablet (10 mg total) by mouth daily.   metoprolol tartrate (LOPRESSOR) 50 MG tablet Take 1 tablet (50 mg total) by mouth 2 (two) times daily.   montelukast (SINGULAIR) 10 MG tablet TAKE 1 TABLET BY MOUTH ONCE DAILY IN THE EVENING (Patient taking differently: Take 10 mg by mouth every evening. )   Multiple Vitamins-Minerals (CENTRUM) tablet Take 1 tablet by mouth daily.     omeprazole (PRILOSEC) 20 MG capsule Take 1 capsule (20 mg total) by mouth daily.   traMADol (ULTRAM) 50 MG tablet Take 1 tablet (50 mg total) by mouth every 6 (six) hours as needed for moderate pain.   [DISCONTINUED] amiodarone  (PACERONE) 200 MG tablet Take 1 tablet (200 mg total) by mouth 2 (two) times daily. For one week;then take Amiodarone 200 mg daily thereafter   [DISCONTINUED] apixaban (ELIQUIS) 5 MG TABS tablet Take 1 tablet (5 mg total) by mouth 2 (two) times daily.   [DISCONTINUED] simvastatin (ZOCOR) 40 MG tablet Take 40 mg by mouth daily.     Allergies:   Aspirin, Amoxicillin, and Sulfonamide derivatives   Social History   Socioeconomic History   Marital status: Married    Spouse name: Not on file   Number of children: Not on file   Years of education: Not on file   Highest education level: Not on file  Occupational History   Not on file  Social Needs   Financial resource strain: Not on file   Food  insecurity    Worry: Not on file    Inability: Not on file   Transportation needs    Medical: Not on file    Non-medical: Not on file  Tobacco Use   Smoking status: Former Smoker    Packs/day: 2.00    Years: 14.00    Pack years: 28.00    Quit date: 07/27/1969    Years since quitting: 49.8   Smokeless tobacco: Never Used  Substance and Sexual Activity   Alcohol use: Yes    Comment: beer occ   Drug use: Never   Sexual activity: Not on file  Lifestyle   Physical activity    Days per week: Not on file    Minutes per session: Not on file   Stress: Not on file  Relationships   Social connections    Talks on phone: Not on file    Gets together: Not on file    Attends religious service: Not on file    Active member of club or organization: Not on file    Attends meetings of clubs or organizations: Not on file    Relationship status: Not on file  Other Topics Concern   Not on file  Social History Narrative   Not on file     Family History: The patient's family history includes Heart attack in his father.  ROS:   Please see the history of present illness.     All other systems reviewed and are negative.  EKGs/Labs/Other Studies Reviewed:    The following  studies were reviewed today: Cardiac catheterization 04/18/2019   Heavy left coronary system calcification.  Severe three-vessel obstructive coronary disease involving the RCA and circumflex most severely.  Left main and LAD have intermediate stenoses and by coronary CT FFR the mid to distal LAD territory has a value of 0.8 or less.  Left main with tubular 40 to 50% distal stenosis  LAD larger diameter than left main.  Mid vessel contains eccentric 50 to 70% narrowing in the region of the third diagonal.  Each of 3 moderate to large diagonal branches have disease with 80% first diagonal 80% second diagonal and 50 to 60% third diagonal.  90+ percent proximal stenosis and large ramus intermedius.  90% mid circumflex and 80% third obtuse marginal.  The circumflex system supplies collaterals to the right coronary.  RCA is functionally occluded and has left to right collaterals.  There is 99% proximal stenosis 80% mid stenosis and diffuse disease proximal to the PDA.  Normal left ventricular function.  Normal LVEDP.  EF estimated to be 60%  RECOMMENDATIONS:   Patient is allergic to aspirin (anaphylaxis).  Needs TCTS consultation for multivessel coronary bypass grafting.  Potential interventional options could be a multi stent strategy however this seems to be a less favorable given the patient's age and otherwise excellent health.  CABG will likely provide symptom control and improve longevity.  Echocardiogram 04/25/2019 1. Left ventricular ejection fraction, by visual estimation, is 60 to 65%. The left ventricle has normal function. Normal left ventricular size. There is no left ventricular hypertrophy.  2. Global right ventricle has normal systolic function.The right ventricular size is mildly enlarged. No increase in right ventricular wall thickness.  3. Left atrial size was normal.  4. Right atrial size was normal.  5. Mild mitral annular calcification.  6. The mitral valve is  normal in structure. No evidence of mitral valve regurgitation. No evidence of mitral stenosis.  7. The tricuspid valve is normal  in structure. Tricuspid valve regurgitation was not visualized by color flow Doppler.  8. The aortic valve is normal in structure. Aortic valve regurgitation is trivial by color flow Doppler. Structurally normal aortic valve, with no evidence of sclerosis or stenosis.  9. The pulmonic valve was normal in structure. Pulmonic valve regurgitation is not visualized by color flow Doppler. 10. The inferior vena cava is normal in size with greater than 50% respiratory variability, suggesting right atrial pressure of 3 mmHg.  EKG:  EKG today 05/25/2019 sinus rhythm with occasional premature ventricular complexes 72 bpm ordered today.    EKG 05/04/2019 Sinus tachycardia with premature ventricular complexes and fusion complexes 116 bpm  Recent Labs: 12/15/2018: TSH 1.81 04/27/2019: ALT 19 05/02/2019: Magnesium 2.4 05/03/2019: Hemoglobin 11.5; Platelets 163 05/07/2019: BUN 20; Creatinine, Ser 0.94; Potassium 3.9; Sodium 140  Recent Lipid Panel    Component Value Date/Time   CHOL 181 12/15/2018 1140   TRIG 138.0 12/15/2018 1140   HDL 51.20 12/15/2018 1140   CHOLHDL 4 12/15/2018 1140   VLDL 27.6 12/15/2018 1140   LDLCALC 102 (H) 12/15/2018 1140   LDLDIRECT 122.0 08/20/2014 0939    Physical Exam:    VS:  BP 119/73    Pulse 74    Temp 98.8 F (37.1 C)    Ht 5\' 8"  (1.727 m)    Wt 176 lb (79.8 kg)    SpO2 96%    BMI 26.76 kg/m     Wt Readings from Last 3 Encounters:  05/25/19 176 lb (79.8 kg)  05/12/19 179 lb (81.2 kg)  05/09/19 175 lb 14.4 oz (79.8 kg)     GEN:  Well nourished, well developed in no acute distress HEENT: Normal NECK: No JVD; No carotid bruits LYMPHATICS: No lymphadenopathy CARDIAC: RRR, no murmurs, rubs, gallops RESPIRATORY:  Clear to auscultation without rales, wheezing or rhonchi  ABDOMEN: Soft, non-tender, non-distended MUSCULOSKELETAL:  No  edema; No deformity  SKIN: Warm and dry sternal incision clean ,dry, intact ,no drainage.  Chest tube sites clean, dry, intact, no drainage. NEUROLOGIC:  Alert and oriented x 3 PSYCHIATRIC:  Normal affect   ASSESSMENT:    1. Essential hypertension   2. Coronary artery disease involving native coronary artery of native heart with other form of angina pectoris ([place])   3. S/P CABG x 4   4. Hyperlipidemia LDL goal <130    PLAN:    A. fib RVR-EKG today shows normal sinus rhythm with occasional PVCs.  He did not have prior atrial fibrillation underwent CABG x4 on 05/01/2019 and had his first bout of A. fib with RVR on 05/03/2019 which was converted with IV amiodarone.  Patient states he was able to feel when he was in atrial fibrillation and has not felt any further palpitations or flutters.   Continue amiodarone 200 mg daily Continue apixaban 5 mg tablet twice daily  Status post CABG x4-underwent surgery on 05/01/2019  (LIMA to LAD,RIMA to PDA, right radial to distal obtuse marginal, and ramus intermediate of left circumflex coronary artery.)  No chest pain today, increased shortness of breath, chest pressure, lower extremity edema. Continue atorvastatin 40 mg tablet daily Continue Isodril 10 mg tablet 3 times daily Continue clopidogrel 75 mg daily Not on ASA Due to allergy Increase physical activity as tolerated-adhere to sternal precautions Heart healthy low-sodium diet BMP today, lipid panel and LFTs around November 30  Essential hypertension- blood pressure today 119/73.  Well-controlled at home. Continue lisinopril 10 mg tablet daily Heart healthy low-sodium diet  Hyperlipidemia-12/15/2018: Cholesterol 181; HDL 51.20; LDL Cholesterol 102; Triglycerides 138.0; VLDL 27.6 Continue atorvastatin 40 mg tablet daily November lipid panel and LFTs.   In order of problems listed above:  Essential hypertension - Plan: EKG [id number], Basic metabolic panel, CANCELED: Basic metabolic panel,  CANCELED: Lipid panel, CANCELED: Hepatic function panel  Coronary artery disease involving native coronary artery of native heart with other form of angina pectoris ([place]) - Plan: EKG XX123456, Basic metabolic panel, CANCELED: Basic metabolic panel, CANCELED: Lipid panel, CANCELED: Hepatic function panel  S/P CABG x 4 - Plan: EKG XX123456, Basic metabolic panel, CANCELED: Basic metabolic panel, CANCELED: Lipid panel, CANCELED: Hepatic function panel  Hyperlipidemia LDL goal <130 - Plan: EKG XX123456, Basic metabolic panel, Lipid panel, Hepatic function panel, CANCELED: Basic metabolic panel, CANCELED: Lipid panel, CANCELED: Hepatic function panel   Medication Adjustments/Labs and Tests Ordered: Current medicines are reviewed at length with the patient today.  Concerns regarding medicines are outlined above.  Orders Placed This Encounter  Procedures   Basic metabolic panel   Lipid panel   Hepatic function panel   EKG 12-Lead   Meds ordered this encounter  Medications   amiodarone (PACERONE) 200 MG tablet    Sig: Take 1 tablet (200 mg total) by mouth daily.    Dispense:  30 tablet    Refill:  3   apixaban (ELIQUIS) 5 MG TABS tablet    Sig: Take 1 tablet (5 mg total) by mouth 2 (two) times daily.    Dispense:  60 tablet    Refill:  3   Disposition: Follow-up with Dr. Debara Pickett in 3 months   Signed, Deberah Pelton, NP  05/25/2019 10:30 AM    [place]

## 2019-05-25 ENCOUNTER — Other Ambulatory Visit: Payer: Self-pay

## 2019-05-25 ENCOUNTER — Telehealth: Payer: Self-pay | Admitting: *Deleted

## 2019-05-25 ENCOUNTER — Telehealth: Payer: Self-pay | Admitting: General Practice

## 2019-05-25 ENCOUNTER — Encounter: Payer: Self-pay | Admitting: Physician Assistant

## 2019-05-25 ENCOUNTER — Other Ambulatory Visit: Payer: Self-pay | Admitting: General Practice

## 2019-05-25 ENCOUNTER — Other Ambulatory Visit: Payer: Self-pay | Admitting: Internal Medicine

## 2019-05-25 ENCOUNTER — Ambulatory Visit (INDEPENDENT_AMBULATORY_CARE_PROVIDER_SITE_OTHER): Payer: Medicare Other | Admitting: General Practice

## 2019-05-25 VITALS — BP 119/73 | HR 74 | Temp 98.8°F | Ht 68.0 in | Wt 176.0 lb

## 2019-05-25 DIAGNOSIS — I25118 Atherosclerotic heart disease of native coronary artery with other forms of angina pectoris: Secondary | ICD-10-CM | POA: Diagnosis not present

## 2019-05-25 DIAGNOSIS — I4891 Unspecified atrial fibrillation: Secondary | ICD-10-CM | POA: Diagnosis not present

## 2019-05-25 DIAGNOSIS — I10 Essential (primary) hypertension: Secondary | ICD-10-CM

## 2019-05-25 DIAGNOSIS — E875 Hyperkalemia: Secondary | ICD-10-CM | POA: Diagnosis not present

## 2019-05-25 DIAGNOSIS — E785 Hyperlipidemia, unspecified: Secondary | ICD-10-CM

## 2019-05-25 DIAGNOSIS — Z951 Presence of aortocoronary bypass graft: Secondary | ICD-10-CM | POA: Diagnosis not present

## 2019-05-25 LAB — BASIC METABOLIC PANEL
BUN/Creatinine Ratio: 14 (ref 10–24)
BUN: 15 mg/dL (ref 8–27)
CO2: 26 mmol/L (ref 20–29)
Calcium: 9.7 mg/dL (ref 8.6–10.2)
Chloride: 98 mmol/L (ref 96–106)
Creatinine, Ser: 1.1 mg/dL (ref 0.76–1.27)
GFR calc Af Amer: 76 mL/min/{1.73_m2} (ref >59)
GFR calc non Af Amer: 65 mL/min/{1.73_m2} (ref >59)
Glucose: 96 mg/dL (ref 65–99)
Potassium: 5.8 mmol/L (ref 3.5–5.2)
Sodium: 140 mmol/L (ref 134–144)

## 2019-05-25 MED ORDER — FUROSEMIDE 40 MG PO TABS: ORAL_TABLET | ORAL | 0 refills | Status: DC

## 2019-05-25 MED ORDER — AMIODARONE HCL 200 MG PO TABS: 200.0000 mg | ORAL_TABLET | Freq: Every day | ORAL | 3 refills | Status: DC

## 2019-05-25 MED ORDER — APIXABAN 5 MG PO TABS: 5.0000 mg | ORAL_TABLET | Freq: Two times a day (BID) | ORAL | 3 refills | Status: DC

## 2019-05-25 MED ORDER — OMEPRAZOLE 20 MG PO CPDR: 20.0000 mg | DELAYED_RELEASE_CAPSULE | Freq: Every day | ORAL | 3 refills | Status: DC

## 2019-05-25 NOTE — Patient Instructions (Signed)
Medication Instructions:  Take Amiodarone 200 mg once daily.  Take Eliquis 5 mg twice daily.  Take Atorvastatin 40 mg daily.  STOP Potassium.  *If you need a refill on your cardiac medications before your next appointment, please call your pharmacy*  Lab Work: Your physician recommends that you return for lab work today: BMET  Your physician recommends that you return for a FASTING lipid profile and hepatic function panel at the end of November.   If you have labs (blood work) drawn today and your tests are completely normal, you will receive your results only by: [name] Kitchen MyChart Message (if you have MyChart) OR . A paper copy in the mail If you have any lab test that is abnormal or we need to change your treatment, we will call you to review the results.   Follow-Up: At [facility], you and your health needs are our priority.  As part of our continuing mission to provide you with exceptional heart care, we have created designated Provider Care Teams.  These Care Teams include your primary Cardiologist (physician) and Advanced Practice Providers (APPs -  Physician Assistants and Nurse Practitioners) who all work together to provide you with the care you need, when you need it.  Your next appointment:   3 months (Ok to be seen early January)  The format for your next appointment:   In Person  Provider:   You may see Pixie Casino, MD (ONLY).

## 2019-05-25 NOTE — Addendum Note (Signed)
Addended by: Therisa Doyne on: 05/25/2019 03:49 PM   Modules accepted: Orders

## 2019-05-25 NOTE — Telephone Encounter (Signed)
[facility] called today's lab -  Potassium 5.8   Hard copy will be faxed  Information given to J. CleaverNP - which test was order today /

## 2019-05-25 NOTE — Telephone Encounter (Signed)
Pt contacted via phone to inform him of his elevated potassium. He was unavailable during the time of the call. A VM was left to stop taking potassium and call clinic back.

## 2019-05-25 NOTE — Telephone Encounter (Signed)
Pt potassium 5.8. During OV today he stated he had continued to take his potassium after stopping his lasix post operatively. Pt will need to have call back. I have verbally ordered 40 mg lasix for today, 40 mg lasix Friday, 20 mg lasix Saturday, and 20 mg lasix to be taken on Sunday. He will need repeat BMP on Monday. I have tried to contact him on his cell phone and he did not answer. No VM option.

## 2019-05-25 NOTE — Telephone Encounter (Signed)
Message Received: Today Message Contents  Cleaver, [name], NP  Therisa Doyne        Please order Lasix for his high potassium as well. I would like him to take 40mg  tonight, 40 mg on Friday, 20 mg Saturday and Sunday. He needs to have his BMP drawn on Monday. Also please tell him that if he starts to feel ill or have changes with his heart rate he needs to go to the emergency department. Thank you   Result Notes for Basic metabolic panel  Notes recorded by Therisa Doyne on 05/25/2019 at 4:17 PM EDT  Left detail message with results, ok per DPR, and to call back.  Repeat BMET ordered and given to lab tech [place]. Sent Lasix Rx to pt preferred pharmacy for 3 tab with 0 refills.

## 2019-05-26 ENCOUNTER — Telehealth: Payer: Self-pay | Admitting: General Practice

## 2019-05-26 NOTE — Telephone Encounter (Signed)
Patient contacted to discuss elevated potassium, new prescription for Lasix, and repeating his BMP on Monday.  He was not available at the time of the call.  Voice message left.  Will call back later today.

## 2019-05-26 NOTE — Research (Signed)
[place] 30 Day phone Visit   Pt doing well at this time, no adverse events. Will continue to follow patient.

## 2019-05-26 NOTE — Telephone Encounter (Signed)
Patient contacted at home about elevated potassium levels.  He expressed understanding and had begun to take his Lasix.  He will continue Lasix 40 mg on Saturday and take 20 mg on Sunday.  We will repeat BMP on Monday to reevaluate potassium level.  No further questions at this time.  Patient states he feels well, has had no palpitations, chest pain, changes in breathing or chest pressure.

## 2019-05-29 ENCOUNTER — Encounter (HOSPITAL_COMMUNITY): Payer: Self-pay

## 2019-05-29 DIAGNOSIS — E785 Hyperlipidemia, unspecified: Secondary | ICD-10-CM | POA: Diagnosis not present

## 2019-05-29 DIAGNOSIS — I25118 Atherosclerotic heart disease of native coronary artery with other forms of angina pectoris: Secondary | ICD-10-CM | POA: Diagnosis not present

## 2019-05-29 DIAGNOSIS — Z951 Presence of aortocoronary bypass graft: Secondary | ICD-10-CM | POA: Diagnosis not present

## 2019-05-29 DIAGNOSIS — I10 Essential (primary) hypertension: Secondary | ICD-10-CM | POA: Diagnosis not present

## 2019-05-29 LAB — BASIC METABOLIC PANEL
BUN/Creatinine Ratio: 14 (ref 10–24)
BUN: 17 mg/dL (ref 8–27)
CO2: 25 mmol/L (ref 20–29)
Calcium: 10 mg/dL (ref 8.6–10.2)
Chloride: 98 mmol/L (ref 96–106)
Creatinine, Ser: 1.25 mg/dL (ref 0.76–1.27)
GFR calc Af Amer: 65 mL/min/{1.73_m2} (ref >59)
GFR calc non Af Amer: 56 mL/min/{1.73_m2} — ABNORMAL LOW (ref >59)
Glucose: 124 mg/dL — ABNORMAL HIGH (ref 65–99)
Potassium: 5 mmol/L (ref 3.5–5.2)
Sodium: 143 mmol/L (ref 134–144)

## 2019-05-29 LAB — LIPID PANEL
Chol/HDL Ratio: 3.4 ratio (ref 0.0–5.0)
Cholesterol, Total: 153 mg/dL (ref 100–199)
HDL: 45 mg/dL (ref >39)
LDL Chol Calc (NIH): 83 mg/dL (ref 0–99)
Triglycerides: 142 mg/dL (ref 0–149)
VLDL Cholesterol Cal: 25 mg/dL (ref 5–40)

## 2019-05-29 LAB — HEPATIC FUNCTION PANEL
ALT: 18 IU/L (ref 0–44)
AST: 25 IU/L (ref 0–40)
Albumin: 3.9 g/dL (ref 3.7–4.7)
Alkaline Phosphatase: 163 IU/L — ABNORMAL HIGH (ref 39–117)
Bilirubin Total: 0.5 mg/dL (ref 0.0–1.2)
Bilirubin, Direct: 0.15 mg/dL (ref 0.00–0.40)
Total Protein: 6.6 g/dL (ref 6.0–8.5)

## 2019-05-29 NOTE — Telephone Encounter (Signed)
Attempted to call patient in regards to Cardiac Rehab - LM on VM Mailed letter 

## 2019-05-31 ENCOUNTER — Other Ambulatory Visit: Payer: Self-pay | Admitting: Physician Assistant

## 2019-05-31 ENCOUNTER — Other Ambulatory Visit: Payer: Self-pay | Admitting: Internal Medicine

## 2019-05-31 ENCOUNTER — Other Ambulatory Visit: Payer: Self-pay | Admitting: Cardiothoracic Surgery

## 2019-05-31 DIAGNOSIS — I25119 Atherosclerotic heart disease of native coronary artery with unspecified angina pectoris: Secondary | ICD-10-CM

## 2019-05-31 NOTE — Telephone Encounter (Signed)
LMOM; patient to call back and discuss transition to warfarin and schedule 1st appointment.   3 weeks samples for Eliquis left @ front desk.

## 2019-05-31 NOTE — Telephone Encounter (Signed)
Called patient- he states that his Arne [place] is too expensive- he states that he thought they had this fixed in the hospital, but [facility] just told him it was going to be $400- patient states he has not tried patient assistance, advised with him I would send a message to our PharmD as well as primary nurse to advise on the cost of medication and assistance.

## 2019-05-31 NOTE — Telephone Encounter (Signed)
We can provide 3 weeks of Eliquis samples until patient assistance for completed.

## 2019-05-31 NOTE — Telephone Encounter (Signed)
New Message:    Please call, pt says he can not afford the Eliquis. He will need some by Saturday please.[name] KitchenHe thought it was all worked out. Please change to something else.[name] Kitchen

## 2019-05-31 NOTE — Telephone Encounter (Signed)
Patient will come by to pick up 3 weeks of eliquis samples He does not think he will qualify for assistance as he doesn't think he has spent 3% of income on Rx(s) He reports his supplemental insurance costs $400/month and his drugs are basically all covered  He is interested in switching to warfarin d/t cost Routed to CV[facility]

## 2019-06-02 ENCOUNTER — Other Ambulatory Visit: Payer: Self-pay

## 2019-06-02 ENCOUNTER — Ambulatory Visit
Admission: RE | Admit: 2019-06-02 | Discharge: 2019-06-02 | Disposition: A | Discharge status: Home / Self Care | Payer: Medicare Other | Source: Ambulatory Visit | Attending: Thoracic Surgery (Cardiothoracic Vascular Surgery) | Admitting: Thoracic Surgery (Cardiothoracic Vascular Surgery)

## 2019-06-02 ENCOUNTER — Ambulatory Visit (INDEPENDENT_AMBULATORY_CARE_PROVIDER_SITE_OTHER): Payer: Self-pay | Admitting: Cardiothoracic Surgery

## 2019-06-02 VITALS — BP 160/80 | HR 73 | Temp 97.6°F | Resp 20 | Ht 68.0 in | Wt 175.0 lb

## 2019-06-02 DIAGNOSIS — Z951 Presence of aortocoronary bypass graft: Secondary | ICD-10-CM

## 2019-06-02 DIAGNOSIS — I25119 Atherosclerotic heart disease of native coronary artery with unspecified angina pectoris: Secondary | ICD-10-CM

## 2019-06-02 DIAGNOSIS — I2510 Atherosclerotic heart disease of native coronary artery without angina pectoris: Secondary | ICD-10-CM

## 2019-06-02 DIAGNOSIS — R05 Cough: Secondary | ICD-10-CM | POA: Diagnosis not present

## 2019-06-03 NOTE — Progress Notes (Signed)
[address]       [place] [address]             [phone]     CARDIOTHORACIC SURGERY OFFICE NOTE  Referring Provider is Hilty, Nadean Corwin, MD Primary Cardiologist is Pixie Casino, MD PCP is [name], MD   HPI:  73 yo man s/p cabg initially complicated by atrial fibrillation which required several additional days in the hospital for rhythm control and ultimately anticoagulation. He has been doing well since discharge and returns for 2nd postoperative visit. No complaints. Denies chest pain, palpitations or shortness of breath.   Current Outpatient Medications  Medication Sig Dispense Refill  . acetaminophen (TYLENOL) 500 MG tablet Take 2 tablets (1,000 mg total) by mouth every 6 (six) hours as needed for moderate pain. 30 tablet 0  . albuterol (PROAIR HFA) 108 (90 Base) MCG/ACT inhaler 2 puffs every 4 hours as needed only  if your can't catch your breath (Patient taking differently: Inhale 2 puffs into the lungs every 4 (four) hours as needed for wheezing or shortness of breath. ) 1 Inhaler 11  . amiodarone (PACERONE) 200 MG tablet Take 1 tablet (200 mg total) by mouth daily. 30 tablet 3  . apixaban (ELIQUIS) 5 MG TABS tablet Take 1 tablet (5 mg total) by mouth 2 (two) times daily. 60 tablet 3  . atorvastatin (LIPITOR) 40 MG tablet Take 1 tablet (40 mg total) by mouth daily at 6 PM. 30 tablet 1  . budesonide (PULMICORT) 0.25 MG/2ML nebulizer solution USE 1 VIAL IN NEBULIZER TWICE DAILY 120 mL 11  . clopidogrel (PLAVIX) 75 MG tablet Take 1 tablet (75 mg total) by mouth daily. 30 tablet 1  . famotidine (PEPCID) 20 MG tablet Take 20 mg by mouth daily as needed for heartburn or indigestion.    . fluticasone (FLONASE) 50 MCG/ACT nasal spray USE 1 SPRAY IN EACH NOSTRIL EVERY MORNING  AND AT BEDTIME AS NEEDED (Patient taking differently: Place 1 spray into both nostrils at bedtime as needed for allergies. ) 48 g 1  . formoterol (PERFOROMIST) 20 MCG/2ML  nebulizer solution Take 2 mLs (20 mcg total) by nebulization 2 (two) times daily. 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 isosorbide dinitrate (ISORDIL) 10 MG tablet Take 1 tablet (10 mg total) by mouth 3 (three) times daily. 90 tablet 0  . lisinopril (ZESTRIL) 10 MG tablet Take 1 tablet (10 mg total) by mouth daily. 30 tablet 1  . LORazepam (ATIVAN) 0.5 MG tablet Take 0.5 mg by mouth daily as needed for anxiety.    . metoprolol tartrate (LOPRESSOR) 50 MG tablet Take 1 tablet (50 mg total) by mouth 2 (two) times daily. 90 tablet 1  . montelukast (SINGULAIR) 10 MG tablet TAKE 1 TABLET BY MOUTH ONCE DAILY IN THE EVENING (Patient taking differently: Take 10 mg by mouth every evening. ) 90 tablet 1  . Multiple Vitamins-Minerals (CENTRUM) tablet Take 1 tablet by mouth daily.      [name] Kitchen omeprazole (PRILOSEC) 20 MG capsule Take 1 capsule (20 mg total) by mouth daily. 90 capsule 3  . oxymetazoline (AFRIN) 0.05 % nasal spray Place 1 spray into the nose 2 (two) times daily as needed for congestion.     . traMADol (ULTRAM) 50 MG tablet Take 1 tablet (50 mg total) by mouth every 6 (six) hours as needed for moderate pain. 30 tablet 0  . furosemide (LASIX) 40 MG tablet Take 40 mg (1 tab)  by mouth tonight, 40 mg on Friday, 20 mg (1/2 tab) on Saturday and Sunday. (Patient not taking: Reported on 06/02/2019) 3 tablet 0   No current facility-administered medications for this visit.       Physical Exam:   BP (!) 160/80   Pulse 73   Temp 97.6 F (36.4 C) (Skin)   Resp 20   Ht 5\' 8" (1.727 m)   Wt 79.4 kg   SpO2 93% Comment: RA  BMI 26.61 kg/m   General:  Well appearing, NAD  Chest:   cta  CV:   rrr  Incisions:  C/d/i    Extremities:  Radial artery incision well-healed  Diagnostic Tests:  CXR with clear lung fields   Impression:  Doing well after cabg; remains in NSR Plan:  f/u with thoracic surgery as needed Ok to stop eliquis Continue amiodarone Ok to drive car now with caution  I spent in excess of 15  minutes during the conduct of this office consultation and >50% of this time involved direct face-to-face encounter with the patient for counseling and/or coordination of their care.  Level 2                 10 minutes Level 3                 15 minutes Level 4                 25 minutes Level 5                 40  minutes  B. Murvin Natal, MD 06/03/2019 3:01 PM

## 2019-06-06 ENCOUNTER — Other Ambulatory Visit: Payer: Self-pay | Admitting: *Deleted

## 2019-06-06 MED ORDER — CLOPIDOGREL BISULFATE 75 MG PO TABS: 75.0000 mg | ORAL_TABLET | Freq: Every day | ORAL | 1 refills | Status: DC

## 2019-06-07 ENCOUNTER — Telehealth: Payer: Self-pay

## 2019-06-07 NOTE — Telephone Encounter (Signed)
Pt's wife calls w/ questions regarding medications. She states that at his appt on 06/02/19, Dr. Orvan Seen told pt he could d/c one of his medications once he ran out of current supply, but she doesn't remember which one. Dr. Orvan Seen contacted; states pt is to d/c Eliquis (which he already has per wife) and that he does not need to continue taking the Isordil when he runs out. Pt's wife notified.

## 2019-06-26 ENCOUNTER — Telehealth: Payer: Self-pay | Admitting: Internal Medicine

## 2019-06-26 NOTE — Telephone Encounter (Signed)
Pt c/o Shortness Of Breath: STAT if SOB developed within the last 24 hours or pt is noticeably SOB on the phone  1. Are you currently SOB (can you hear that pt is SOB on the phone)? No  2. How long have you been experiencing SOB? 05/09/19  3. Are you SOB when sitting or when up moving around? Moving around   4. Are you currently experiencing any other symptoms? Pain in lower left part of back when moving around and SOB occurs. Pain and SOB stop when patient sits.   I have scheduled appointment for patient on 06/27/19

## 2019-06-26 NOTE — Telephone Encounter (Signed)
Returned call to patient he stated he is sob when he gets up and moves around.Stated he also has pain in left mid side when he is up moving around.He has had this pain since he was discharged from hospital.Appointment already scheduled with Dr.Hilty tomorrow 12/1 at 1:30 pm.Stated he will be leaving for [place] 12/30 for a couple of months.Stated he wanted to arrange cardiac rehab at [facility].Stated he will speak to Dr.Hilty about this at appointment tomorrow.Advised to keep appointment as planned.

## 2019-06-27 ENCOUNTER — Ambulatory Visit (INDEPENDENT_AMBULATORY_CARE_PROVIDER_SITE_OTHER): Payer: Medicare Other | Admitting: Internal Medicine

## 2019-06-27 ENCOUNTER — Ambulatory Visit
Admission: RE | Admit: 2019-06-27 | Discharge: 2019-06-27 | Disposition: A | Discharge status: Home / Self Care | Payer: Medicare Other | Source: Ambulatory Visit | Attending: Internal Medicine | Admitting: Internal Medicine

## 2019-06-27 ENCOUNTER — Other Ambulatory Visit: Payer: Self-pay

## 2019-06-27 ENCOUNTER — Encounter: Payer: Self-pay | Admitting: Internal Medicine

## 2019-06-27 VITALS — BP 169/86 | HR 70 | Ht 68.0 in | Wt 171.0 lb

## 2019-06-27 DIAGNOSIS — Z951 Presence of aortocoronary bypass graft: Secondary | ICD-10-CM

## 2019-06-27 DIAGNOSIS — I480 Paroxysmal atrial fibrillation: Secondary | ICD-10-CM

## 2019-06-27 DIAGNOSIS — E785 Hyperlipidemia, unspecified: Secondary | ICD-10-CM | POA: Diagnosis not present

## 2019-06-27 DIAGNOSIS — R079 Chest pain, unspecified: Secondary | ICD-10-CM | POA: Diagnosis not present

## 2019-06-27 DIAGNOSIS — I209 Angina pectoris, unspecified: Secondary | ICD-10-CM

## 2019-06-27 DIAGNOSIS — R0602 Shortness of breath: Secondary | ICD-10-CM

## 2019-06-27 DIAGNOSIS — J90 Pleural effusion, not elsewhere classified: Secondary | ICD-10-CM | POA: Diagnosis not present

## 2019-06-27 NOTE — Progress Notes (Signed)
OFFICE CONSULT NOTE  Chief Complaint:  Follow-up CABG  Primary Care Physician: [name], MD  HPI:  Antonio Carlson is a 73 y.o. male who is being seen today for the evaluation of chest pain at the request of [name], MD. This is a pleasant 73 year old male patient of Dr. Melvyn Novas he is been followed for asthma for some time.  He has a history of hypertension, dyslipidemia and PVCs in the past.  Family history significant for his father who had an MI in his 65s.  He was a previously a smoker but quit in the 1970s.  Since about January has had some exertional burning in his chest.  Is worse with exercise and improves at rest.  It does not radiate.  His symptoms have been fairly stable.  He says it does not always happen with exertion.  It was felt that some of the symptoms were out of proportion for his lung disease.  His dyslipidemia is well managed with total cholesterol 181, triglycerides 138, HDL 51 and LDL 102 on 40 mg simvastatin.  He is not on aspirin due to anaphylactic reaction.  He also has hypertension and is on a beta-blocker.  Heart rate is in the low 60s.  Blood pressures well controlled today.  EKG was performed the office shows sinus rhythm with sinus arrhythmia at 61.  [name] is an avid hunter but also notes that when he walks a certain distance he gets pain in both of the buttocks and upper thighs and has to stop.  Once he stops the symptoms get better and then he gets up and goes on again.  He has had x-rays of his hips which showed some mild osteoarthritis but no significant findings to explain his pain.  06/27/2019  [name] returns today for follow-up of coronary artery bypass grafting which was in early October.  He had four-vessel CABG with LIMA to LAD, RIMA to PDA, right radial to distal obtuse marginal and ramus intermedius on May 01, 2019.  He did very well except for postoperative A. fib with RVR requiring amiodarone and spontaneously converted to sinus.   He was placed on Eliquis and amiodarone however he could not afford the Eliquis and discontinued it fairly quickly.  He was seen in follow-up and has since been seen by Dr. Orvan Seen who operated on him.  His anticoagulation was discontinued but he remains on amiodarone.  He was cleared to start driving.  According to Mr. [place] he is doing fairly well except that he has been having some cough and left sided lower chest discomfort in the back.  Is worse sometimes with change in position or taking deep breaths.  This was brought up but not felt to be significant.  He plans to go to [place] where he stays for typically 3 months.  He would like to do cardiac rehabilitation at hospital there that is about 15 minutes from his house.  He denies any recurrent palpitations or A. fib.  EKG today shows sinus rhythm.  He did have recent labs show total cholesterol 153, triglycerides 142, HDL 45 and LDL of 83.  Previously he was on simvastatin but changed to atorvastatin 40 mg daily.  PMHx:  Past Medical History:  Diagnosis Date  . Allergy   . Anginal pain ([place])   . Arthritis   . Asthma   . Benign prostatic hypertrophy   . Coronary artery disease   . Diverticulosis   . Dyspnea   .  GERD (gastroesophageal reflux disease)   . History of kidney stones   . Hyperlipidemia   . Hypertension   . Nephrolithiasis   . PVC's (premature ventricular contractions)   . Syncope     Past Surgical History:  Procedure Laterality Date  . APPENDECTOMY    . CORONARY ARTERY BYPASS GRAFT N/A 05/01/2019   Procedure: CORONARY ARTERY BYPASS GRAFTING (CABG) TIMES FOUR USING LEFT AND RIGHT INTERNAL MAMMARY ARTERIES AND RIGHT RADIAL ARTERY WITH STERNAL PLATING;  Surgeon: [name], MD;  Location: [facility];  Service: Open Heart Surgery;  Laterality: N/A;  . LEFT HEART CATH AND CORONARY ANGIOGRAPHY N/A 04/18/2019   Procedure: LEFT HEART CATH AND CORONARY ANGIOGRAPHY;  Surgeon: Belva Crome, MD;  Location: [facility];   Service: Cardiovascular;  Laterality: N/A;  . NASAL SINUS SURGERY  Sept 2012  . RADIAL ARTERY HARVEST Right 05/01/2019   Procedure: Right Radial Artery Harvest;  Surgeon: [name], MD;  Location: [facility];  Service: Open Heart Surgery;  Laterality: Right;  . TEE WITHOUT CARDIOVERSION N/A 05/01/2019   Procedure: TRANSESOPHAGEAL ECHOCARDIOGRAM (TEE);  Surgeon: [name], MD;  Location: [facility];  Service: Open Heart Surgery;  Laterality: N/A;    FAMHx:  Family History  Problem Relation Age of Onset  . Heart attack Father     SOCHx:   reports that he quit smoking about 49 years ago. He has a 28.00 pack-year smoking history. He has never used smokeless tobacco. He reports current alcohol use. He reports that he does not use drugs.  ALLERGIES:  Allergies  Allergen Reactions  . Aspirin Shortness Of Breath  . Amoxicillin     GI upset Did it involve swelling of the face/tongue/throat, SOB, or low BP? No Did it involve sudden or severe rash/hives, skin peeling, or any reaction on the inside of your mouth or nose? No Did you need to seek medical attention at a hospital or doctor's office? No When did it last happen?20+ years If all above answers are "NO", may proceed with cephalosporin use.   . Sulfonamide Derivatives Other (See Comments)    Doesn't remember    ROS: Pertinent items noted in HPI and remainder of comprehensive ROS otherwise negative.  HOME MEDS: Current Outpatient Medications on File Prior to Visit  Medication Sig Dispense Refill  . amiodarone (PACERONE) 200 MG tablet Take 1 tablet (200 mg total) by mouth daily. 30 tablet 3  . atorvastatin (LIPITOR) 40 MG tablet Take 1 tablet (40 mg total) by mouth daily at 6 PM. 30 tablet 1  . calcium citrate-vitamin D 500-400 MG-UNIT chewable tablet Chew 1 tablet by mouth daily.    . clopidogrel (PLAVIX) 75 MG tablet Take 1 tablet (75 mg total) by mouth daily. 30 tablet 1  . lisinopril (ZESTRIL) 10 MG tablet Take 1  tablet (10 mg total) by mouth daily. 30 tablet 1  . metoprolol tartrate (LOPRESSOR) 50 MG tablet Take 1 tablet (50 mg total) by mouth 2 (two) times daily. 90 tablet 1  . montelukast (SINGULAIR) 10 MG tablet TAKE 1 TABLET BY MOUTH ONCE DAILY IN THE EVENING (Patient taking differently: Take 10 mg by mouth every evening. ) 90 tablet 1  . Multiple Vitamins-Minerals (CENTRUM) tablet Take 1 tablet by mouth daily.      [name] Kitchen omeprazole (PRILOSEC) 20 MG capsule Take 1 capsule (20 mg total) by mouth daily. 90 capsule 3  . [DISCONTINUED] rosuvastatin (CRESTOR) 20 MG tablet Take 1 tablet (20 mg total)  by mouth at bedtime. 30 tablet 1   No current facility-administered medications on file prior to visit.     LABS/IMAGING: No results found for this or any previous visit (from the past 48 hour(s)). No results found.  LIPID PANEL:    Component Value Date/Time   CHOL 153 05/29/2019 1055   TRIG 142 05/29/2019 1055   HDL 45 05/29/2019 1055   CHOLHDL 3.4 05/29/2019 1055   CHOLHDL 4 12/15/2018 1140   VLDL 27.6 12/15/2018 1140   LDLCALC 83 05/29/2019 1055   LDLDIRECT 122.0 08/20/2014 0939    WEIGHTS: Wt Readings from Last 3 Encounters:  06/27/19 171 lb (77.6 kg)  06/02/19 175 lb (79.4 kg)  05/25/19 176 lb (79.8 kg)    VITALS: BP (!) 169/86   Pulse 70   Ht 5\' 8"  (1.727 m)   Wt 171 lb (77.6 kg)   SpO2 97%   BMI 26.00 kg/m   EXAM: General appearance: alert and no distress Neck: no carotid bruit, no JVD and thyroid not enlarged, symmetric, no tenderness/mass/nodules Lungs: diminished breath sounds LLL Heart: regular rate and rhythm, S1, S2 normal, no murmur, click, rub or gallop Abdomen: soft, non-tender; bowel sounds normal; no masses,  no organomegaly Extremities: extremities normal, atraumatic, no cyanosis or edema and varicose veins noted Pulses: normal LE pulses Skin: Skin color, texture, turgor normal. No rashes or lesions Neurologic: Grossly normal Psych: Pleasant  EKG: Sinus rhythm  with PVCs at 70, nonspecific T wave changes-personally reviewed  ASSESSMENT: 1. Coronary artery disease status post four-vessel CABG (05/01/2019-LIMA to LAD, RIMA to PDA, right radial to distal obtuse marginal, and radial Ms. intermedius of the left circumflex) Dr. Orvan Seen 2. Left lower chest discomfort-?  Pleural effusion 3. Asthma 4. Hypertension 5. Dyslipidemia 6. Bilateral hip pain 7. Asymptomatic bilateral varicose veins  PLAN: 1.   Mr. Kotas is recovering from four-vessel bypass in early October.  He has been cleared to drive and is off anticoagulation.  There is been no recurrent atrial fibrillation.  I advised him to decrease his amiodarone to 100 mg daily and discontinue it in 30 days at the first of 2021.  We will provide an order or paperwork to allow him to do phase 2 cardiac rehabilitation in [place].  Blood pressure appears to be well controlled.  He is having some shortness of breath and left lower chest discomfort.  On exam there is dullness to percussion at the left base and I suspect he may have a pleural effusion.  I have encouraged him to use an incentive spirometer.  We will get a two-view chest x-ray today.  He may need a little diuretic or if the effusion is moderate sized then I may coordinate with CT surgery to see if it needs to be drained.  Thanks again for the kind referral.  Pixie Casino, MD, FACC, [place] Director of the [facility]  [facility] Reduction [facility] Diplomate of the American Board of Clinical Lipidology Attending Cardiologist  Direct Dial: [phone]  Fax: [phone]  Website:  [email or web address]  06/27/2019, 1:38 PM

## 2019-06-27 NOTE — Patient Instructions (Signed)
Medication Instructions:  DECREASE YOUR AMIODARONE TO 100 MG DAILY FOR 1 MONTH IF YOU DO NOT HAVE ANY FLUTTER/FIB YOU MAY STOP IN January   *If you need a refill on your cardiac medications before your next appointment, please call your pharmacy*  Lab Work: FASTING LIPID IN 6 MONTHS PRIOR TO FOLLOW UP  If you have labs (blood work) drawn today and your tests are completely normal, you will receive your results only by: [name] Kitchen MyChart Message (if you have MyChart) OR . A paper copy in the mail If you have any lab test that is abnormal or we need to change your treatment, we will call you to review the results.  Testing/Procedures: A chest x-ray takes a picture of the organs and structures inside the chest, including the heart, lungs, and blood vessels. This test can show several things, including, whether the heart is enlarges; whether fluid is building up in the lungs; and whether pacemaker / defibrillator leads are still in place.  Follow-Up: At [facility], you and your health needs are our priority.  As part of our continuing mission to provide you with exceptional heart care, we have created designated Provider Care Teams.  These Care Teams include your primary Cardiologist (physician) and Advanced Practice Providers (APPs -  Physician Assistants and Nurse Practitioners) who all work together to provide you with the care you need, when you need it.  Your next appointment:   6 month(s) CALL THE OFFICE IN ABOUT 3-4 MONTHS TO SCHEDULE   The format for your next appointment:   In Person  Provider:   You may see Pixie Casino, MD or one of the following Advanced Practice Providers on your designated Care Team:    Almyra Deforest, PA-C  Fabian Sharp, PA-C or   Roby Lofts, [place]

## 2019-06-28 ENCOUNTER — Other Ambulatory Visit: Payer: Self-pay | Admitting: Physician Assistant

## 2019-07-03 ENCOUNTER — Telehealth: Payer: Self-pay | Admitting: Internal Medicine

## 2019-07-03 MED ORDER — LISINOPRIL 10 MG PO TABS: 10.0000 mg | ORAL_TABLET | Freq: Every day | ORAL | 3 refills | Status: DC

## 2019-07-03 MED ORDER — ATORVASTATIN CALCIUM 40 MG PO TABS: 40.0000 mg | ORAL_TABLET | Freq: Every day | ORAL | 3 refills | Status: DC

## 2019-07-03 NOTE — Telephone Encounter (Signed)
Called pt and clarified needing both Lipitor and Lisinopril filled. Refilled Rx. Pt aware.

## 2019-07-03 NOTE — Addendum Note (Signed)
Addended by: Cain Sieve on: 07/03/2019 01:33 PM   Modules accepted: Orders

## 2019-07-03 NOTE — Telephone Encounter (Signed)
Called pt to let him know he is taking Lisinopril for BP and that he should continue to take the medication according to Dr [facility] advice. Pt verbalized understanding. Refilled Rx.

## 2019-07-03 NOTE — Telephone Encounter (Signed)
New Message  Pt c/o medication issue:  1. Name of Medication: lisinopril (ZESTRIL) 10 MG tablet  2. How are you currently taking this medication (dosage and times per day)? As written  3. Are you having a reaction (difficulty breathing--STAT)? No  4. What is your medication issue? Have 3 or 4 days left of medication. Would like to know what the medication is for. Want's to know if he stills needs to take medication

## 2019-07-03 NOTE — Telephone Encounter (Signed)
Follow Up   *STAT* If patient is at the pharmacy, call can be transferred to refill team.   1. Which medications need to be refilled? (please list name of each medication and dose if known)atorvastatin (LIPITOR) 40 MG tablet  2. Which pharmacy/location (including street and city if local pharmacy) is medication to be sent to?[place], [place] - [id number]  3. Do they need a 30 day or 90 day supply? 30 day

## 2019-07-14 ENCOUNTER — Ambulatory Visit (INDEPENDENT_AMBULATORY_CARE_PROVIDER_SITE_OTHER): Payer: Medicare Other

## 2019-07-14 ENCOUNTER — Encounter (HOSPITAL_COMMUNITY): Payer: Self-pay

## 2019-07-14 ENCOUNTER — Other Ambulatory Visit: Payer: Self-pay

## 2019-07-14 ENCOUNTER — Ambulatory Visit (HOSPITAL_COMMUNITY)
Admission: EM | Admit: 2019-07-14 | Discharge: 2019-07-14 | Disposition: A | Discharge status: Home / Self Care | Payer: Medicare Other | Attending: Emergency Medicine | Admitting: Emergency Medicine

## 2019-07-14 DIAGNOSIS — M545 Low back pain, unspecified: Secondary | ICD-10-CM

## 2019-07-14 DIAGNOSIS — M25559 Pain in unspecified hip: Secondary | ICD-10-CM | POA: Diagnosis not present

## 2019-07-14 DIAGNOSIS — W19XXXA Unspecified fall, initial encounter: Secondary | ICD-10-CM | POA: Diagnosis not present

## 2019-07-14 MED ORDER — DICLOFENAC SODIUM 1 % EX GEL: 2.0000 g | Freq: Four times a day (QID) | CUTANEOUS | 0 refills | Status: DC

## 2019-07-14 MED ORDER — TIZANIDINE HCL 2 MG PO TABS: 2.0000 mg | ORAL_TABLET | Freq: Every day | ORAL | 0 refills | Status: DC

## 2019-07-14 NOTE — ED Triage Notes (Signed)
Patient presents to Urgent Care with complaints of right hip pain that has started to radiate up his side since the past week. Patient reports the pain sort of began in October after his open heart surgery, pt states the pain is worse when he massages it. Pt also endorses a small fall about a week ago when he tripped and landed on a coffee table on his right hip.

## 2019-07-14 NOTE — ED Provider Notes (Signed)
[place]    CSN: [id number] Arrival date & time: 07/14/19  [id number]      History   Chief Complaint Chief Complaint  Patient presents with  . Hip Pain    Right    HPI Antonio Carlson is a 73 y.o. male.   Antonio Carlson presents with complaints of right low back and hip pain. Really started to worsen since yesterday afternoon. Movement worsens the pain such as certain twisting or bending. No rash. No fevers or chills. No previous similar. Has had issues with a "pinched nerve" to his back in the past but wasn't to this area. No numbness or tingling to extremities. No saddle symptoms. No loss of bladder or  Bowel function. Ambulatory. Has been taking tylenol for pain which has minimally helped. No specific injury but states that approximately a week ago he slipped off the edge of his bed and he did strike his right hip onto the edge of his coffee table. Never had any bruising or issues since then, however. Had open heart surgery 05/01/2019. Hasn't had to see an orthopedist in years. History  Of BPH, CAD, gerd, htn, pvc's.    ROS per HPI, negative if not otherwise mentioned.      Past Medical History:  Diagnosis Date  . Allergy   . Anginal pain ([place])   . Arthritis   . Asthma   . Benign prostatic hypertrophy   . Coronary artery disease   . Diverticulosis   . Dyspnea   . GERD (gastroesophageal reflux disease)   . History of kidney stones   . Hyperlipidemia   . Hypertension   . Nephrolithiasis   . PVC's (premature ventricular contractions)   . Syncope     Patient Active Problem List   Diagnosis Date Noted  . Coronary artery disease 05/01/2019  . 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 CAD, multiple vessel   . Angina pectoris syndrome ([place]) 12/15/2018  . Nasal polyposis 03/17/2016  . Upper airway cough syndrome vs cough variant component  02/18/2015  . Cervical neck pain with evidence of disc disease = C8 nerve root distribution 06/11/2014  . Syncope 05/31/2014  .  Mechanical low back pain 02/01/2014  . Hip pain, left 02/05/2012  . URI (upper respiratory infection) 10/01/2011  . Essential hypertension 09/15/2010  . RECTAL BLEEDING 09/15/2010  . Blood in stool 09/11/2010  . 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 OSTEOPENIA 10/10/2007  . COLONIC POLYPS 07/08/2007  . Hyperlipidemia LDL goal <130 07/08/2007  . Allergic rhinitis 07/08/2007  . [place] asthma 07/08/2007  . Diverticulosis of colon 07/08/2007  . NEPHROLITHIASIS 07/08/2007  . BENIGN PROSTATIC HYPERTROPHY, HX OF 07/08/2007    Past Surgical History:  Procedure Laterality Date  . APPENDECTOMY    . CORONARY ARTERY BYPASS GRAFT N/A 05/01/2019   Procedure: CORONARY ARTERY BYPASS GRAFTING (CABG) TIMES FOUR USING LEFT AND RIGHT INTERNAL MAMMARY ARTERIES AND RIGHT RADIAL ARTERY WITH STERNAL PLATING;  Surgeon: [name], MD;  Location: [facility];  Service: Open Heart Surgery;  Laterality: N/A;  . LEFT HEART CATH AND CORONARY ANGIOGRAPHY N/A 04/18/2019   Procedure: LEFT HEART CATH AND CORONARY ANGIOGRAPHY;  Surgeon: Belva Crome, MD;  Location: [facility];  Service: Cardiovascular;  Laterality: N/A;  . NASAL SINUS SURGERY  Sept 2012  . RADIAL ARTERY HARVEST Right 05/01/2019   Procedure: Right Radial Artery Harvest;  Surgeon: [name], MD;  Location: [facility];  Service: Open Heart Surgery;  Laterality:  Right;  . TEE WITHOUT CARDIOVERSION N/A 05/01/2019   Procedure: TRANSESOPHAGEAL ECHOCARDIOGRAM (TEE);  Surgeon: [name], MD;  Location: [facility];  Service: Open Heart Surgery;  Laterality: N/A;       Home Medications    Prior to Admission medications   Medication Sig Start Date End Date Taking? Authorizing Provider  amiodarone (PACERONE) 200 MG tablet Take 200 mg by mouth as directed. TAKE 1/2 TABLET DAILY    [provider]  atorvastatin (LIPITOR) 40 MG tablet Take 1 tablet (40 mg total) by mouth daily at 6 PM. 07/03/19   Hilty, Nadean Corwin, MD  calcium  citrate-vitamin D 500-400 MG-UNIT chewable tablet Chew 1 tablet by mouth daily.    [provider]  clopidogrel (PLAVIX) 75 MG tablet Take 1 tablet (75 mg total) by mouth daily. 06/06/19   Hilty, Nadean Corwin, MD  diclofenac Sodium (VOLTAREN) 1 % GEL Apply 2 g topically 4 (four) times daily. 07/14/19   Augusto Gamble B, NP  lisinopril (ZESTRIL) 10 MG tablet Take 1 tablet (10 mg total) by mouth daily. 07/03/19   Hilty, Nadean Corwin, MD  metoprolol tartrate (LOPRESSOR) 50 MG tablet Take 1 tablet (50 mg total) by mouth 2 (two) times daily. 05/08/19   Lars Pinks M, PA-C  montelukast (SINGULAIR) 10 MG tablet TAKE 1 TABLET BY MOUTH ONCE DAILY IN THE EVENING Patient taking differently: Take 10 mg by mouth every evening.  02/14/19   [name], MD  Multiple Vitamins-Minerals (CENTRUM) tablet Take 1 tablet by mouth daily.      [provider]  omeprazole (PRILOSEC) 20 MG capsule Take 1 capsule (20 mg total) by mouth daily. 05/25/19   [name], MD  tiZANidine (ZANAFLEX) 2 MG tablet Take 1 tablet (2 mg total) by mouth at bedtime. 07/14/19   Zigmund Gottron, NP  rosuvastatin (CRESTOR) 20 MG tablet Take 1 tablet (20 mg total) by mouth at bedtime. 08/07/11 10/01/11  [name], MD    Family History Family History  Problem Relation Age of Onset  . Heart attack Father     Social History Social History   Tobacco Use  . Smoking status: Former Smoker    Packs/day: 2.00    Years: 14.00    Pack years: 28.00    Quit date: 07/27/1969    Years since quitting: 49.9  . Smokeless tobacco: Never Used  Substance Use Topics  . Alcohol use: Yes    Comment: beer occ  . Drug use: Never     Allergies   Aspirin, Amoxicillin, and Sulfonamide derivatives   Review of Systems Review of Systems   Physical Exam Triage Vital Signs ED Triage Vitals  Enc Vitals Group     BP 07/14/19 1014 (!) 177/90     Pulse Rate 07/14/19 1014 62     Resp 07/14/19 1014 16     Temp 07/14/19  1014 97.6 F (36.4 C)     Temp Source 07/14/19 1014 Oral     SpO2 07/14/19 1014 97 %     Weight --      Height --      Head Circumference --      Peak Flow --      Pain Score 07/14/19 1012 3     Pain Loc --      Pain Edu? --      Excl. in [place]? --    No data found.  Updated Vital Signs BP (!) 177/90 (BP Location: Left  Arm)   Pulse 62   Temp 97.6 F (36.4 C) (Oral)   Resp 16   SpO2 97%   Visual Acuity Right Eye Distance:   Left Eye Distance:   Bilateral Distance:    Right Eye Near:   Left Eye Near:    Bilateral Near:     Physical Exam Constitutional:      Appearance: He is well-developed.  Cardiovascular:     Rate and Rhythm: Normal rate.  Pulmonary:     Effort: Pulmonary effort is normal.  Musculoskeletal:     Lumbar back: Tenderness present. No bony tenderness. Negative right straight leg raise test and negative left straight leg raise test.     Right hip: No deformity, tenderness or bony tenderness. Normal range of motion. Normal strength.     Comments: No pain to right hip with palpation or ROM; strength equal bilaterally; gross sensation intact to lower extremities; right low back musculature with tenderness on palpation as well as pain with right hip flexion into back; no pain with straight leg raise; ambulatory without difficulty; no rash visualized  Skin:    General: Skin is warm and dry.  Neurological:     Mental Status: He is alert and oriented to person, place, and time.      UC Treatments / Results  Labs (all labs ordered are listed, but only abnormal results are displayed) Labs Reviewed - No data to display  EKG   Radiology DG Hip Unilat With Pelvis 2-3 Views Right  Result Date: 07/14/2019 CLINICAL DATA:  Right hip pain 2 days.  Fall 1 week ago. EXAM: DG HIP (WITH OR WITHOUT PELVIS) 2-3V RIGHT COMPARISON:  None. FINDINGS: Mild symmetric degenerative changes of the hips. No evidence of acute fracture or dislocation. Moderate degenerative changes  of the spine. IMPRESSION: No acute findings. Electronically Signed   By: Marin Olp M.D.   On: 07/14/2019 11:05    Procedures Procedures (including critical care time)  Medications Ordered in UC Medications - No data to display  Initial Impression / Assessment and Plan / UC Course  I have reviewed the triage vital signs and the nursing notes.  Pertinent labs & imaging results that were available during my care of the patient were reviewed by me and considered in my medical decision making (see chart for details).     Hip/pelvis imaging normal today which is reassuring, especially s/p fall. Right lower back musculature with tenderness, consistent with strain to affected area. Pain management discussed, tylenol, topical voltaren, tizanidine at night. Return precautions provided. Encouraged follow up with orthopedics prn. Patient verbalized understanding and agreeable to plan.  Ambulatory out of clinic without difficulty.    Final Clinical Impressions(s) / UC Diagnoses   Final diagnoses:  Hip pain  Fall  Acute right-sided low back pain without sciatica     Discharge Instructions     Your hip/pelvic xray is normal today which is reassuring. The pain you are experiencing does seem more consistent with muscular strain to your right low back.  Continue with tylenol regularly. Light and regular activity/stretching.  Topical voltaren gel as an antiinflammatory.  At night may use tizanidine. May cause drowsiness. Please do not take if driving or drinking alcohol.  Change positions slowly.  Follow up with orthopedics for any persistent symptoms.  Notify your PCP if a rash develops.    ED Prescriptions    Medication Sig Dispense Auth. Provider   diclofenac Sodium (VOLTAREN) 1 % GEL Apply 2 g topically  4 (four) times daily. 150 g Augusto Gamble B, NP   tiZANidine (ZANAFLEX) 2 MG tablet Take 1 tablet (2 mg total) by mouth at bedtime. 20 tablet Zigmund Gottron, NP     PDMP not  reviewed this encounter.   Zigmund Gottron, NP 07/14/19 1547

## 2019-07-14 NOTE — Discharge Instructions (Signed)
Your hip/pelvic xray is normal today which is reassuring. The pain you are experiencing does seem more consistent with muscular strain to your right low back.  Continue with tylenol regularly. Light and regular activity/stretching.  Topical voltaren gel as an antiinflammatory.  At night may use tizanidine. May cause drowsiness. Please do not take if driving or drinking alcohol.  Change positions slowly.  Follow up with orthopedics for any persistent symptoms.  Notify your PCP if a rash develops.

## 2019-07-25 ENCOUNTER — Ambulatory Visit: Payer: Medicare Other | Admitting: Internal Medicine

## 2019-07-25 DIAGNOSIS — Z006 Encounter for examination for normal comparison and control in clinical research program: Secondary | ICD-10-CM

## 2019-07-25 NOTE — Research (Signed)
[place] 90 Day Follow up   Patient doing well. No adverse events. This will complete the patients participation in the [employer].

## 2019-07-31 ENCOUNTER — Telehealth: Payer: Self-pay | Admitting: Internal Medicine

## 2019-07-31 NOTE — Telephone Encounter (Signed)
Pt c/o medication issue:  1. Name of Medication: amiodarone (PACERONE) 200 MG tablet  2. How are you currently taking this medication (dosage and times per day)? 1/2 TABLET DAILY  3. Are you having a reaction (difficulty breathing--STAT)? No  4. What is your medication issue? Patient is inquiring if he needs to continue taking medication.

## 2019-07-31 NOTE — Telephone Encounter (Signed)
The patient has been made aware and verbalized his understanding.  

## 2019-07-31 NOTE — Telephone Encounter (Signed)
Patient request new prescriptions for Perforomist and budesonide sent to [facility], [place], [place].  Prescription needs diagnosis code. Patient stated he has been on these medications, prescribed by Dr. Melvyn Novas, and will run out next weekend. Perforomist and budesonide are not on current med list.  LOV- 03/16/19, with Dr. Melvyn Novas  Instructions  Congratulations on your upcoming marriage !   No change in your medications   Please schedule a follow up visit in 6 months but call sooner if needed - can refer you to Dr Neldon Mc to consider [place] routed to Dr. Melvyn Novas to advise

## 2019-07-31 NOTE — Telephone Encounter (Signed)
Ok to refill  Use dx of ACOS  Asthma copd overlap syndrome

## 2019-07-31 NOTE — Telephone Encounter (Signed)
Yes, decrease amio to 100 mg daily for this month - if no recurrent afib/flutter - then can stop at the end of the month.  Dr Lemmie Evens

## 2019-07-31 NOTE — Telephone Encounter (Signed)
The patient was calling to inquire about the Amiodarone. Per the last office visit the patient  was to decrease the Amiodarone to 100 mg once daily and then could discontinue the medication around the end of January.  The patient has been taking 200 mg until now and not the half a tablet (100 mg). He has been advised to go ahead and start taking half a tablet daily and we will get further recommendation from Dr. Debara Pickett.  The patient is in [place] until the end of February.

## 2019-08-01 MED ORDER — BUDESONIDE 0.25 MG/2ML IN SUSP: 0.2500 mg | Freq: Every day | RESPIRATORY_TRACT | 1 refills | Status: DC

## 2019-08-01 MED ORDER — PERFOROMIST 20 MCG/2ML IN NEBU: 20.0000 ug | INHALATION_SOLUTION | Freq: Two times a day (BID) | RESPIRATORY_TRACT | 1 refills | Status: DC

## 2019-08-01 NOTE — Telephone Encounter (Signed)
Spoke with pt and advised rx's for Budesonide and Perforomist sent to pharmacy. Nothing further is needed.

## 2019-08-07 ENCOUNTER — Telehealth: Payer: Self-pay | Admitting: Internal Medicine

## 2019-08-07 NOTE — Telephone Encounter (Signed)
I called and spoke with the pharmacist at [facility] and she made her aware that it should be twice daily instead of once daily. I gave her a verbal order for the twice daily Budesonide #120 with 1 refill. She states that since the patient already picked up the medication to have him take it twice daily and then call a couple days before he is to run out and she will send it in as a dosage change so that he will not be without medication. He was a little confused by what I was explaining to him and I went over it again to try to help him better understand. Nothing further is needed.

## 2019-08-16 ENCOUNTER — Telehealth: Payer: Self-pay | Admitting: Internal Medicine

## 2019-08-16 NOTE — Telephone Encounter (Signed)
New Message    *STAT* If patient is at the pharmacy, call can be transferred to refill team.   1. Which medications need to be refilled? (please list name of each medication and dose if known)   2. Which pharmacy/location (including street and city if local pharmacy) is medication to be sent to? [place], [place]   Phone Number: [phone]. Do they need a 30 day or 90 day supply? [place]

## 2019-08-18 MED ORDER — ATORVASTATIN CALCIUM 40 MG PO TABS: 40.0000 mg | ORAL_TABLET | Freq: Every day | ORAL | 3 refills | Status: DC

## 2019-08-18 NOTE — Telephone Encounter (Signed)
Called pt and pt stated that he needed Atorvastatin refilled and sent to his pharmacy in [place]. I sent medication as requested. Confirmation received.

## 2019-08-21 ENCOUNTER — Other Ambulatory Visit: Payer: Self-pay | Admitting: Internal Medicine

## 2019-08-21 MED ORDER — CLOPIDOGREL BISULFATE 75 MG PO TABS: 75.0000 mg | ORAL_TABLET | Freq: Every day | ORAL | 3 refills | Status: DC

## 2019-08-21 NOTE — Telephone Encounter (Signed)
*  STAT* If patient is at the pharmacy, call can be transferred to refill team.   1. Which medications need to be refilled? (please list name of each medication and dose if known) need a new prescription for Clopidogrel  2. Which pharmacy/location (including street and city if local pharmacy) is medication to be sent to?[facility] in [place]  3. Do they need a 30 day or 90 day supply? 90 days and refills- need this today please

## 2019-09-01 ENCOUNTER — Other Ambulatory Visit: Payer: Self-pay

## 2019-09-06 ENCOUNTER — Telehealth: Payer: Self-pay | Admitting: Internal Medicine

## 2019-09-06 MED ORDER — BUDESONIDE 0.25 MG/2ML IN SUSP: 0.2500 mg | Freq: Two times a day (BID) | RESPIRATORY_TRACT | 5 refills | Status: DC

## 2019-09-06 NOTE — Telephone Encounter (Signed)
The rx had been sent with incorrect dx code and only for once per day dosing  I corrected- sent for bid with correct dx for for 1 month supply with 5 refills  Spoke with the pt and notified that this was done  Nothing further needed

## 2019-09-06 NOTE — Telephone Encounter (Signed)
Pt called in stating that his RX for budesonide (PULMICORT) 0.25 MG/2ML nebulizer solution   is still wrong - he went to pick it up today and the RX still states 1 vial per day - "should read 1 vial twice daily"- for the month it should be 60 vials -  also need to have the Medicare code? I am assuming the [place] - pt states he doesn't know what code he is talking about - it should go through the Medicare Part B -  Pharmacy : [facility] on [address]. In [place], [place] Phone: [phone]   Pt states that he needs this done today as he will run out - Please call when ordered - CB# [phone]

## 2019-09-07 ENCOUNTER — Other Ambulatory Visit: Payer: Self-pay

## 2019-09-07 MED ORDER — LISINOPRIL 10 MG PO TABS: 10.0000 mg | ORAL_TABLET | Freq: Every day | ORAL | 3 refills | Status: DC

## 2019-09-18 ENCOUNTER — Ambulatory Visit: Payer: Medicare Other | Admitting: Internal Medicine

## 2019-09-24 ENCOUNTER — Other Ambulatory Visit: Payer: Self-pay | Admitting: Internal Medicine

## 2019-10-03 ENCOUNTER — Ambulatory Visit: Payer: Medicare Other | Admitting: Internal Medicine

## 2019-10-09 ENCOUNTER — Encounter: Payer: Self-pay | Admitting: Internal Medicine

## 2019-10-09 ENCOUNTER — Ambulatory Visit (INDEPENDENT_AMBULATORY_CARE_PROVIDER_SITE_OTHER): Payer: Medicare Other | Admitting: Internal Medicine

## 2019-10-09 ENCOUNTER — Other Ambulatory Visit: Payer: Self-pay

## 2019-10-09 DIAGNOSIS — J45909 Unspecified asthma, uncomplicated: Secondary | ICD-10-CM

## 2019-10-09 DIAGNOSIS — I10 Essential (primary) hypertension: Secondary | ICD-10-CM

## 2019-10-09 DIAGNOSIS — Z886 Allergy status to analgesic agent status: Secondary | ICD-10-CM

## 2019-10-09 DIAGNOSIS — J339 Nasal polyp, unspecified: Secondary | ICD-10-CM

## 2019-10-09 LAB — CBC WITH DIFFERENTIAL/PLATELET
Basophils Absolute: 0.1 10*3/uL (ref 0.0–0.1)
Basophils Relative: 1.9 % (ref 0.0–3.0)
Eosinophils Absolute: 0.9 10*3/uL — ABNORMAL HIGH (ref 0.0–0.7)
Eosinophils Relative: 13.1 % — ABNORMAL HIGH (ref 0.0–5.0)
HCT: 45.3 % (ref 39.0–52.0)
Hemoglobin: 14.8 g/dL (ref 13.0–17.0)
Lymphocytes Relative: 24.5 % (ref 12.0–46.0)
Lymphs Abs: 1.8 10*3/uL (ref 0.7–4.0)
MCHC: 32.7 g/dL (ref 30.0–36.0)
MCV: 80.9 fl (ref 78.0–100.0)
Monocytes Absolute: 1 10*3/uL (ref 0.1–1.0)
Monocytes Relative: 13.5 % — ABNORMAL HIGH (ref 3.0–12.0)
Neutro Abs: 3.4 10*3/uL (ref 1.4–7.7)
Neutrophils Relative %: 47 % (ref 43.0–77.0)
Platelets: 309 10*3/uL (ref 150.0–400.0)
RBC: 5.6 Mil/uL (ref 4.22–5.81)
RDW: 16.9 % — ABNORMAL HIGH (ref 11.5–15.5)
WBC: 7.2 10*3/uL (ref 4.0–10.5)

## 2019-10-09 LAB — BASIC METABOLIC PANEL
BUN: 14 mg/dL (ref 6–23)
CO2: 31 mEq/L (ref 19–32)
Calcium: 9.4 mg/dL (ref 8.4–10.5)
Chloride: 101 mEq/L (ref 96–112)
Creatinine, Ser: 0.98 mg/dL (ref 0.40–1.50)
GFR: 74.33 mL/min (ref >60.00)
Glucose, Bld: 102 mg/dL — ABNORMAL HIGH (ref 70–99)
Potassium: 4.8 mEq/L (ref 3.5–5.1)
Sodium: 139 mEq/L (ref 135–145)

## 2019-10-09 MED ORDER — TELMISARTAN 80 MG PO TABS: 80.0000 mg | ORAL_TABLET | Freq: Every day | ORAL | 11 refills | Status: DC

## 2019-10-09 MED ORDER — PREDNISONE 10 MG PO TABS: ORAL_TABLET | ORAL | 11 refills | Status: DC

## 2019-10-09 NOTE — Patient Instructions (Addendum)
Plan A = Automatic = Always=    Budesonide/ Performist twice daily per nebulizer   Plan B = Backup (to supplement plan A, not to replace it) Only use your albuterol(yellow is proventil)  inhaler as a rescue medication to be used if you can't catch your breath by resting or doing a relaxed purse lip breathing pattern.  - The less you use it, the better it will work when you need it. - Ok to use the inhaler up to 2 puffs  every 4 hours if you must but call for appointment if use goes up over your usual need - Don't leave home without it !!  (think of it like the spare tire for your car)   Plan C = Crisis - if Plan B stops working or you need more of it than usual - Prednisone 10 mg take  4 each am x 2 days,   2 each am x 2 days,  1 each am x 2 days and stop    Stop zestoril = lisinopril   Start micardis (telmasartan) 80 mg one half daily if blood pressure not at goal take a whole pill daily    See [name] w/in 3 months with all your medications, even over the counter meds, separated in two separate bags, the ones you take no matter what vs the ones you stop once you feel better and take only as needed when you feel you need them.   Tammy  will generate for you a new user friendly medication calendar that will put [place] all on the same page re: your medication use.     Without this process, it simply isn't possible to assure that we are providing  your outpatient care  with  the attention to detail we feel you deserve.   If we cannot assure that you're getting that kind of care,  then we cannot manage your problem effectively from this clinic.  Once you have seen Tammy and we are sure that we're all on the same page with your medication use she will arrange follow up with me.     Please remember to go to the lab department   for your tests - we will call you with the results when they are available.  Late add:  Move up [name] f/u to 2 weeks to recheck K and add hctz if elevated (eg micardis  40-12.5)

## 2019-10-09 NOTE — Progress Notes (Signed)
Subjective:    Patient ID: Antonio Carlson, male    DOB: 06/12/1946   MRN: [id number]  Brief patient profile:  74 yowm quit smoking in 1971 with history of Triad Asthma and hyperlipidemia and hbp   Brief patient profile:  76/28/2017 NP  Follow up : HTN, Hyperlipidemia/Triad Asthma  Pt returns for 3 month follow up and med review .  We reviewed all his meds and organized them into a med calendar w/ pt education . He appears to be taking correctly except for flonase , has not been using on regular basis.  Complains of 1 week of nasal congestion , drainage, stuffiness, sneezing, and ear fullness. No fever or sinus pain. Minimal cough .   rec Prednisone taper as directed.  Follow med calendar closely and bring to each visit.  Restart Flonase         12/15/2018  f/u ov/Ryenne Lynam re: triad asthma/ last prednisone  1st of May 2020/ hbp/ hyperlipidemia   Cc new chest burning with exertion new since Jan 2020   Dyspnea:  Not limited by breathing from desired activities  But slows down more than used to - avoid fast x 5 m is limit = MMRC1 = can walk nl pace, flat grade, can't hurry or go uphills or steps s sob   Cough: none  Sleeping: able to lie flat / on pillow SABA use: rarely rec [facility] will contact you for cardiology referral for new exercise related chest discomfort  Please remember to go to the lab and x-ray department   for your tests - we will call you with the results when they are available.   Please schedule a follow up visit in 3 months but call sooner if needed  - consider refer for Dupixent if freq flares needing prednisone    03/16/2019  f/u ov/[name] re:  Triad asthma/ last pred in June 2020  Chief Complaint  Patient presents with  . Follow-up    Patient reports that his breathing and cough are doing well at this time.  Dyspnea: no change with  occ cp with exertion > cards eval in progress , no change pattern  Cough: none now  Sleeping: able to lie flat/ one pillow SABA use:  none 02: none  rec No change rx    10/09/2019  f/u ov/[name] re: triad asthma/ last pred Dec 2020 for back not breathing  Chief Complaint  Patient presents with  . Follow-up    Breathing is doing well and he has not been using his albuterol. He has occ cough that he related to sinus drainage. Also c/o nasal congestion.   Dyspnea:  Nl pace flat surface fine = MMRC1 = can walk nl pace, flat grade, can't hurry or go uphills or steps s sob   -walking up to 30 min daily  Cough: assoc with nasal congestion/some slt discolored mucus  Sleeping: able to lie flat/one pillow  SABA use: very rarely  02: none    No obvious day to day or daytime variability or assoc excess/ purulent sputum or mucus plugs or hemoptysis or cp or chest tightness, subjective wheeze or overt  hb symptoms.   Sleeping  without nocturnal  or early am exacerbation  of respiratory  c/o's or need for noct saba. Also denies any obvious fluctuation of symptoms with weather or environmental changes or other aggravating or alleviating factors except as outlined above   No unusual exposure hx or h/o childhood pna/ asthma or knowledge of  premature birth.  Current Allergies, Complete Past Medical History, Past Surgical History, Family History, and Social History were reviewed in [employer] record.  ROS  The following are not active complaints unless bolded Hoarseness, sore throat, dysphagia, dental problems, itching, sneezing,  nasal congestion with sense of pnds but no nasal discharge of excess mucus or purulent secretions, ear ache,   fever, chills, sweats, unintended wt loss or wt gain, classically pleuritic or exertional cp,  orthopnea pnd or arm/hand swelling  or leg swelling, presyncope, palpitations, abdominal pain, anorexia, nausea, vomiting, diarrhea  or change in bowel habits or change in bladder habits, change in stools or change in urine, dysuria, hematuria,  rash, arthralgias, visual complaints,  headache, numbness, weakness or ataxia or problems with walking or coordination,  change in mood or  memory.        Current Meds  Medication Sig  . albuterol (VENTOLIN HFA) 108 (90 Base) MCG/ACT inhaler Inhale 2 puffs into the lungs every 6 (six) hours as needed for wheezing or shortness of breath.  [name] Kitchen atorvastatin (LIPITOR) 40 MG tablet Take 1 tablet (40 mg total) by mouth daily at 6 PM.  . budesonide (PULMICORT) 0.25 MG/2ML nebulizer solution Take 2 mLs (0.25 mg total) by nebulization 2 (two) times daily.  . calcium citrate-vitamin D 500-400 MG-UNIT chewable tablet Chew 1 tablet by mouth daily.  . clopidogrel (PLAVIX) 75 MG tablet Take 1 tablet (75 mg total) by mouth daily.  . diclofenac Sodium (VOLTAREN) 1 % GEL Apply 2 g topically 4 (four) times daily.  [name] Kitchen lisinopril (ZESTRIL) 10 MG tablet Take 1 tablet (10 mg total) by mouth daily.  . metoprolol tartrate (LOPRESSOR) 50 MG tablet Take 1 tablet (50 mg total) by mouth 2 (two) times daily.  . montelukast (SINGULAIR) 10 MG tablet TAKE 1 TABLET BY MOUTH ONCE DAILY IN THE EVENING (Patient taking differently: Take 10 mg by mouth every evening. )  . Multiple Vitamins-Minerals (CENTRUM) tablet Take 1 tablet by mouth daily.    [name] Kitchen omeprazole (PRILOSEC) 20 MG capsule Take 1 capsule (20 mg total) by mouth daily.  [name] Kitchen PERFOROMIST 20 MCG/2ML nebulizer solution TAKE 2 ML BY MOUTH NEBULIZATION 2 TIMES DAILY  . tiZANidine (ZANAFLEX) 2 MG tablet Take 1 tablet (2 mg total) by mouth at bedtime.                      PMH PVCs.  Syncope  - Holter ordered June 27, 2008 > nl  - EP consult June 27, 2008  OSTEOPENIA (ICD-733.90)  COLONIC POLYPS (ICD-211.3) .......................................[name] Kitchen [name] - see colonoscopy 11/07/10 (repeat in 10 yr)  DIVERTICULOSIS, MILD (ICD-562.10) ASTHMA (ICD-493.90)  - HFA 90% June 11, 2009  ALLERGIC RHINITIS, CHRONIC (ICD-477.9)  - steroid dep until mid oct 2009  - Allergy profile sent January 23, 2010 >>  IgE 18.3  - Polypectomy Sept 2012 ......................................................  [name]/ [place]  BENIGN PROSTATIC HYPERTROPHY, HX OF (ICD-V13.8) .[name] Kitchen... 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 [place].......................................................[name] KitchenWert  R C6/7 radiculopathy 2015 ....................................................... [name]  - CPX 12/15/2018  - Pneumovax 11/2004 and @ age 26 June 11, 2009 and Prevnar 13 08/14/2013  - Td 07/2005   And  08/01/2015        Past Surgical History:  Appendectomy  Colonoscopy     Family History:  heart disease in his father onset at age 32 he was a  smoker  Ca brain half brother  Half siblings  - one lung ca, one breast ca, one brain ca Mother dementia onset late 29s lived to be 27    Social History:  quit smoking 1971  rarely drink alcohol  Retired  Widower 2018  - remarried sept 2020        Objective:   Physical Exam  10/09/2019    170   03/16/2019   179  wt 198 May 17, 2008 >  > 192 07/17/11 >  02/05/2012  191> 05/10/2012  188> 08/10/2012 190> 11/14/2012 180>182 12/12/2012 > 03/14/2013 182 > 07/06/2013 185 > 08/14/2013  184 > 01/31/2014  176 >176 .03/22/2014 >  05/31/2014   181 > 06/11/14  179 > 08/20/2014   184> 02/18/2015  181 > 08/01/2015  178 > 09/16/2015 182  > 12/16/2015   185 > 03/17/2016  185> 08/07/2016   187  > 10/22/2016    183 > 01/22/2017   180> 07/29/2017   177 > 10/28/2017  169 >  06/16/2018  175 > 12/15/2018  182 >     pleasant amb wm nad  Vital signs reviewed  10/09/2019  - Note at rest 02 sats  97% on RA  And bp 150/80     HEENT : pt wearing mask not removed for exam due to covid -19 concerns.    NECK :  without JVD/Nodes/TM/ nl carotid upstrokes bilaterally   LUNGS: no acc muscle use,  Nl contour chest which is clear to A and P  bilaterally without cough on insp or exp maneuvers   CV:  RRR  no s3 or murmur or increase in P2, and trace to 1+ sym LE edema   ABD:  soft and nontender with nl inspiratory excursion in the supine position. No bruits or organomegaly appreciated, bowel sounds nl  MS:  Nl gait/ ext warm without deformities, calf tenderness, cyanosis or clubbing No obvious joint restrictions   SKIN: warm and dry without lesions    NEURO:  alert, approp, nl sensorium with  no motor or cerebellar deficits apparent.       Labs ordered/ reviewed:      Chemistry      Component Value Date/Time   NA 139 10/09/2019 1010   NA 143 05/29/2019 1055   K 4.8 10/09/2019 1010   CL 101 10/09/2019 1010   CO2 31 10/09/2019 1010   BUN 14 10/09/2019 1010   BUN 17 05/29/2019 1055   CREATININE 0.98 10/09/2019 1010      Component Value Date/Time   CALCIUM 9.4 10/09/2019 1010                             Lab Results  Component Value Date   WBC 7.2 10/09/2019   HGB 14.8 10/09/2019   HCT 45.3 10/09/2019   MCV 80.9 10/09/2019   PLT 309.0 10/09/2019       EOS                                                               0.9  10/09/2019        Labs ordered 10/09/2019  :  allergy profile

## 2019-10-10 ENCOUNTER — Encounter: Payer: Self-pay | Admitting: Internal Medicine

## 2019-10-10 LAB — IGE: IgE (Immunoglobulin E), Serum: 106 kU/L (ref <=114)

## 2019-10-10 NOTE — Progress Notes (Signed)
Spoke with pt and notified of results per Dr. Melvyn Novas. Pt verbalized understanding and denied any questions. Appt with TP scheduled for 2 wks.

## 2019-10-10 NOTE — Assessment & Plan Note (Addendum)
Quit smoking 1971  - PFT's  09/16/2015  FEV1 1.81 (61 % ) ratio 56  p 44 % improvement from saba with DLCO  81/81c % corrects to 107  % for alv volume  Done prior to any am meds  - 06/16/2018  After extensive coaching inhaler device,  effectiveness =    90%  - 10/2018 rx prednisone as plan C x 6 days  All goals of chronic asthma control met including optimal function and elimination of symptoms with minimal need for rescue therapy.  Contingencies discussed in full including contacting this office immediately if not controlling the symptoms using the rule of two's.    Good candidate for dupixent if asthma flares on present rx req prednisone as also has h/o nasal polyposis

## 2019-10-10 NOTE — Assessment & Plan Note (Signed)
-   d/c ACEi 10/09/2019  Hoarse, pnds   In the best review of chronic cough to date ( NEJM 2016 375 [id number]) ,  ACEi are now felt to cause cough in up to  20% of pts which is a 4 fold increase from previous reports and does not include the variety of non-specific complaints we see in pulmonary clinic in pts on ACEi but previously attributed to another dx like  Copd/asthma and  include PNDS, throat  congestion, "bronchitis", unexplained dyspnea and noct "strangling" sensations, and hoarseness, but also  atypical /refractory GERD symptoms like dysphagia and "bad heartburn"   The only way I know  to prove this is not an "ACEi Case" is a trial off ACEi x a minimum of 6 weeks then regroup.    >>> try micardis 80 mg daily and watch K  - if rises at 2 week check up recheck K and add hctz if elevated (eg micardis 40-12.5)         Each maintenance medication was reviewed in detail including emphasizing most importantly the difference between maintenance and prns and under what circumstances the prns are to be triggered using an action plan format where appropriate.  Total time for H and P, chart review, counseling, teaching device and generating customized AVS unique to this office visit / charting = 20 min

## 2019-10-11 NOTE — Progress Notes (Signed)
ATC and the line rings and then msg states call can not be completed at this time

## 2019-10-21 ENCOUNTER — Ambulatory Visit: Payer: Medicare Other | Attending: Internal Medicine

## 2019-10-21 DIAGNOSIS — Z23 Encounter for immunization: Secondary | ICD-10-CM

## 2019-10-21 NOTE — Progress Notes (Signed)
   Covid-19 Vaccination Clinic  Name:  Antonio Carlson    MRN: [id number] DOB: 15-Oct-1945  10/21/2019  [name] was observed post Covid-19 immunization for 30 minutes based on pre-vaccination screening without incident. He was provided with Vaccine Information Sheet and instruction to access the V-Safe system.   [name] was instructed to call 911 with any severe reactions post vaccine: [name] Kitchen Difficulty breathing  . Swelling of face and throat  . A fast heartbeat  . A bad rash all over body  . Dizziness and weakness

## 2019-10-23 ENCOUNTER — Encounter: Payer: Medicare Other | Admitting: Adult Health

## 2019-10-26 ENCOUNTER — Other Ambulatory Visit: Payer: Self-pay | Admitting: Internal Medicine

## 2019-10-30 ENCOUNTER — Telehealth: Payer: Self-pay | Admitting: Adult Health

## 2019-10-30 MED ORDER — PERFOROMIST 20 MCG/2ML IN NEBU: INHALATION_SOLUTION | RESPIRATORY_TRACT | 5 refills | Status: DC

## 2019-10-30 NOTE — Telephone Encounter (Signed)
Spoke with pt. Advised him that I have resent his prescription with the diagnosis code. Nothing further was needed.

## 2019-10-31 ENCOUNTER — Ambulatory Visit (INDEPENDENT_AMBULATORY_CARE_PROVIDER_SITE_OTHER): Payer: Medicare Other | Admitting: Adult Health

## 2019-10-31 ENCOUNTER — Other Ambulatory Visit: Payer: Self-pay

## 2019-10-31 ENCOUNTER — Encounter: Payer: Self-pay | Admitting: Adult Health

## 2019-10-31 VITALS — BP 138/82 | HR 59 | Temp 97.8°F | Ht 68.0 in | Wt 172.0 lb

## 2019-10-31 DIAGNOSIS — J45909 Unspecified asthma, uncomplicated: Secondary | ICD-10-CM | POA: Diagnosis not present

## 2019-10-31 DIAGNOSIS — Z886 Allergy status to analgesic agent status: Secondary | ICD-10-CM | POA: Diagnosis not present

## 2019-10-31 DIAGNOSIS — J302 Other seasonal allergic rhinitis: Secondary | ICD-10-CM | POA: Diagnosis not present

## 2019-10-31 DIAGNOSIS — J339 Nasal polyp, unspecified: Secondary | ICD-10-CM | POA: Diagnosis not present

## 2019-10-31 DIAGNOSIS — I10 Essential (primary) hypertension: Secondary | ICD-10-CM | POA: Diagnosis not present

## 2019-10-31 LAB — BASIC METABOLIC PANEL
BUN: 15 mg/dL (ref 6–23)
CO2: 31 mEq/L (ref 19–32)
Calcium: 9.2 mg/dL (ref 8.4–10.5)
Chloride: 101 mEq/L (ref 96–112)
Creatinine, Ser: 1.02 mg/dL (ref 0.40–1.50)
GFR: 70.97 mL/min (ref >60.00)
Glucose, Bld: 93 mg/dL (ref 70–99)
Potassium: 4.2 mEq/L (ref 3.5–5.1)
Sodium: 138 mEq/L (ref 135–145)

## 2019-10-31 MED ORDER — METOPROLOL TARTRATE 50 MG PO TABS: 50.0000 mg | ORAL_TABLET | Freq: Two times a day (BID) | ORAL | 1 refills | Status: DC

## 2019-10-31 MED ORDER — MONTELUKAST SODIUM 10 MG PO TABS: 10.0000 mg | ORAL_TABLET | Freq: Every day | ORAL | 11 refills | Status: DC

## 2019-10-31 MED ORDER — METOPROLOL TARTRATE 50 MG PO TABS: ORAL_TABLET | ORAL | Status: DC

## 2019-10-31 NOTE — Assessment & Plan Note (Signed)
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 Hoarseness resolved off ACE inhibitor, with his asthma history would avoid ACE inhibitor's going forward if possible Check be met today.

## 2019-10-31 NOTE — Patient Instructions (Addendum)
Continue on current regimen.  Low salt diet .  Labs today .  Follow up with Dr. Melvyn Novas  In 2 months and As needed

## 2019-10-31 NOTE — Assessment & Plan Note (Signed)
Continue on his maintenance regimen

## 2019-10-31 NOTE — Assessment & Plan Note (Signed)
Currently well controlled.  Patient has significant allergy component with elevated eosinophils. Only under control continue on his allergy regimen with Claritin, Singulair and Flonase. Continue on maintenance regimen for asthma with budesonide and Perforomist Patient's medications were reviewed today and patient education was given. Computerized medication calendar was adjusted/completed  Plan  Patient Instructions  Continue on current regimen.  Low salt diet .  Labs today .  Follow up with Dr. Melvyn Novas  In 2 months and As needed

## 2019-10-31 NOTE — Addendum Note (Signed)
Addended by: Suzzanne Cloud E on: 10/31/2019 11:22 AM   Modules accepted: Orders

## 2019-10-31 NOTE — Progress Notes (Signed)
@Patient  ID: Antonio Carlson, male    DOB: [birth year], 74 y.o.   MRN: [id number]  Chief Complaint  Patient presents with  . Follow-up    Asthma     Referring provider: [name], MD  HPI: 74 year old male smoker followed for triad asthma Patient is followed for primary care by Dr. Melvyn Novas.  Medical history significant for hyperlipidemia and hypertension And coronary artery disease status post CABG October 2020.  Had postop A. fib with RVR.  Was initially treated briefly amiodarone and anticoagulation  TEST/EVENTS :   10/31/2019 Follow up : Asthma , HTN  Patient returns for a 2-week follow-up.  Patient has underlying asthma.  He remains on budesonide and Perforomist nebulizers twice daily.  Says overall breathing is doing well.  He denies any flare of cough or wheezing. Last office visit was changed from [place] to [place] . No cough .   Patient's blood pressure was elevated last visit.  Patient was changed from Zestril to Micardis.  Patient says he is tolerating well.  Blood pressure today is improved We discussed a healthy low-salt diet.  Says he has been feeling well.  Tries to stay active.  Has gotten remarried last year.   Allergies  Allergen Reactions  . Aspirin Shortness Of Breath  . Amoxicillin     GI upset Did it involve swelling of the face/tongue/throat, SOB, or low BP? No Did it involve sudden or severe rash/hives, skin peeling, or any reaction on the inside of your mouth or nose? No Did you need to seek medical attention at a hospital or doctor's office? No When did it last happen?20+ years If all above answers are "NO", may proceed with cephalosporin use.   . Sulfonamide Derivatives Other (See Comments)    Doesn't remember    Immunization History  Administered Date(s) Administered  . Fluad Quad(high Dose 65+) 05/05/2019  . Influenza Split 07/17/2011, 05/10/2012, 04/26/2014  . Influenza Whole 05/17/2008, 06/11/2009  . Influenza, High Dose Seasonal  PF 05/08/2017, 05/05/2018  . Influenza,inj,Quad PF,6+ Mos 08/14/2013, 04/29/2015  . Influenza-Unspecified 04/27/2016  . PFIZER SARS-COV-2 Vaccination 10/21/2019  . Pneumococcal Conjugate-13 08/14/2013  . Pneumococcal Polysaccharide-23 06/11/2009  . Tdap 08/01/2015    Past Medical History:  Diagnosis Date  . Allergy   . Anginal pain ([place])   . Arthritis   . Asthma   . Benign prostatic hypertrophy   . Coronary artery disease   . Diverticulosis   . Dyspnea   . GERD (gastroesophageal reflux disease)   . History of kidney stones   . Hyperlipidemia   . Hypertension   . Nephrolithiasis   . PVC's (premature ventricular contractions)   . Syncope     Tobacco History: Social History   Tobacco Use  Smoking Status Former Smoker  . Packs/day: 2.00  . Years: 14.00  . Pack years: 28.00  . Quit date: 07/27/1969  . Years since quitting: 50.2  Smokeless Tobacco Never Used   Counseling given: Not Answered   Outpatient Medications Prior to Visit  Medication Sig Dispense Refill  . albuterol (VENTOLIN HFA) 108 (90 Base) MCG/ACT inhaler Inhale 2 puffs into the lungs every 6 (six) hours as needed for wheezing or shortness of breath.    [name] Kitchen atorvastatin (LIPITOR) 40 MG tablet Take 1 tablet (40 mg total) by mouth daily at 6 PM. 90 tablet 3  . budesonide (PULMICORT) 0.25 MG/2ML nebulizer solution Take 2 mLs (0.25 mg total) by nebulization 2 (two) times daily. 120 mL 5  .  calcium citrate-vitamin D 500-400 MG-UNIT chewable tablet Chew 1 tablet by mouth daily.    . clopidogrel (PLAVIX) 75 MG tablet Take 1 tablet (75 mg total) by mouth daily. 90 tablet 3  . diclofenac Sodium (VOLTAREN) 1 % GEL Apply 2 g topically 4 (four) times daily. 150 g 0  . formoterol (PERFOROMIST) 20 MCG/2ML nebulizer solution USE 2 ML IN NEBULIZER  TWICE DAILY 120 mL 5  . metoprolol tartrate (LOPRESSOR) 50 MG tablet Take 1 tablet (50 mg total) by mouth 2 (two) times daily. 90 tablet 1  . montelukast (SINGULAIR) 10 MG tablet  TAKE 1 TABLET BY MOUTH ONCE DAILY IN THE EVENING (Patient taking differently: Take 10 mg by mouth every evening. ) 90 tablet 1  . Multiple Vitamins-Minerals (CENTRUM) tablet Take 1 tablet by mouth daily.      [name] Kitchen omeprazole (PRILOSEC) 20 MG capsule Take 1 capsule (20 mg total) by mouth daily. 90 capsule 3  . predniSONE (DELTASONE) 10 MG tablet Take  4 each am x 2 days,   2 each am x 2 days,  1 each am x 2 days and stop 14 tablet 11  . telmisartan (MICARDIS) 80 MG tablet Take 1 tablet (80 mg total) by mouth daily. 30 tablet 11  . tiZANidine (ZANAFLEX) 2 MG tablet Take 1 tablet (2 mg total) by mouth at bedtime. (Patient not taking: Reported on 10/31/2019) 20 tablet 0   No facility-administered medications prior to visit.     Review of Systems:   Constitutional:   No  weight loss, night sweats,  Fevers, chills, fatigue, or  lassitude.  HEENT:   No headaches,  Difficulty swallowing,  Tooth/dental problems, or  Sore throat,                No sneezing, itching, ear ache, nasal congestion, post nasal drip,   CV:  No chest pain,  Orthopnea, PND, swelling in lower extremities, anasarca, dizziness, palpitations, syncope.   GI  No heartburn, indigestion, abdominal pain, nausea, vomiting, diarrhea, change in bowel habits, loss of appetite, bloody stools.   Resp: No shortness of breath with exertion or at rest.  No excess mucus, no productive cough,  No non-productive cough,  No coughing up of blood.  No change in color of mucus.  No wheezing.  No chest wall deformity  Skin: no rash or lesions.  GU: no dysuria, change in color of urine, no urgency or frequency.  No flank pain, no hematuria   MS:  No joint pain or swelling.  No decreased range of motion.  No back pain.    Physical Exam  BP 138/82 (BP Location: Left Arm, Patient Position: Sitting, Cuff Size: Normal)   Pulse (!) 59   Temp 97.8 F (36.6 C) (Temporal)   Ht 5' 8"  (1.727 m)   Wt 172 lb (78 kg)   SpO2 96% Comment: room air  BMI 26.15  kg/m   GEN: A/Ox3; pleasant , NAD, well nourished    HEENT:  [place]/AT,   NOSE-clear, THROAT-clear, no lesions, no postnasal drip or exudate noted.   NECK:  Supple w/ fair ROM; no JVD; normal carotid impulses w/o bruits; no thyromegaly or nodules palpated; no lymphadenopathy.    RESP  Clear  P & A; w/o, wheezes/ rales/ or rhonchi. no accessory muscle use, no dullness to percussion  CARD:  RRR, no m/r/g, no peripheral edema, pulses intact, no cyanosis or clubbing.  GI:   Soft & nt; nml bowel sounds; no organomegaly or  masses detected.   Musco: Warm bil, no deformities or joint swelling noted.   Neuro: alert, no focal deficits noted.    Skin: Warm, no lesions or rashes     BNP No results found for: BNP  ProBNP No results found for: PROBNP  Imaging: No results found.    PFT Results Latest Ref Rng & Units 04/27/2019 09/16/2015  FVC-Pre L 3.36 2.76  FVC-Predicted Pre % 86 68  FVC-Post L 3.70 3.22  FVC-Predicted Post % 94 80  Pre FEV1/FVC % % 56 45  Post FEV1/FCV % % 57 56  FEV1-Pre L 1.87 1.25  FEV1-Predicted Pre % 66 42  FEV1-Post L 2.11 1.81  DLCO UNC% % 103 81  DLCO COR %Predicted % 109 107  TLC L 8.31 7.53  TLC % Predicted % 125 113  RV % Predicted % 195 187    No results found for: NITRICOXIDE      Assessment & Plan:   Triad asthma Currently well controlled.  Patient has significant allergy component with elevated eosinophils. Only under control continue on his allergy regimen with Claritin, Singulair and Flonase. Continue on maintenance regimen for asthma with budesonide and Perforomist Patient's medications were reviewed today and patient education was given. Computerized medication calendar was adjusted/completed  Plan  Patient Instructions  Continue on current regimen.  Low salt diet .  Labs today .  Follow up with Dr. 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 Hoarseness resolved off ACE inhibitor, with his asthma history would avoid ACE inhibitor's going forward if possible Check be met today.     Rexene Edison, NP 10/31/2019

## 2019-11-13 DIAGNOSIS — R3912 Poor urinary stream: Secondary | ICD-10-CM | POA: Diagnosis not present

## 2019-11-13 DIAGNOSIS — N401 Enlarged prostate with lower urinary tract symptoms: Secondary | ICD-10-CM | POA: Diagnosis not present

## 2019-11-13 DIAGNOSIS — R972 Elevated prostate specific antigen [PSA]: Secondary | ICD-10-CM | POA: Diagnosis not present

## 2019-11-13 DIAGNOSIS — N5201 Erectile dysfunction due to arterial insufficiency: Secondary | ICD-10-CM | POA: Diagnosis not present

## 2019-11-13 DIAGNOSIS — L723 Sebaceous cyst: Secondary | ICD-10-CM | POA: Diagnosis not present

## 2019-11-14 ENCOUNTER — Ambulatory Visit: Payer: Medicare Other | Attending: Internal Medicine

## 2019-11-14 DIAGNOSIS — Z23 Encounter for immunization: Secondary | ICD-10-CM

## 2019-11-14 NOTE — Progress Notes (Signed)
   Covid-19 Vaccination Clinic  Name:  Antonio Carlson    MRN: [id number] DOB: [birth year]  11/14/2019  [name] was observed post Covid-19 immunization for 15 minutes without incident. He was provided with Vaccine Information Sheet and instruction to access the V-Safe system.   [name] was instructed to call 911 with any severe reactions post vaccine: [name] Kitchen Difficulty breathing  . Swelling of face and throat  . A fast heartbeat  . A bad rash all over body  . Dizziness and weakness   Immunizations Administered    Name Date Dose VIS Date Route   Pfizer COVID-19 Vaccine 11/14/2019  9:55 AM 0.3 mL 09/20/2018 Intramuscular   Manufacturer: [place]   Lot: [id number]   [place]: [id number]

## 2019-11-14 NOTE — Progress Notes (Signed)
   Covid-19 Vaccination Clinic  Name:  Antonio Carlson    MRN: [id number] DOB: 1946/05/06  11/14/2019  [name] was observed post Covid-19 immunization for 15 minutes without incident. He was provided with Vaccine Information Sheet and instruction to access the V-Safe system.   [name] was instructed to call 911 with any severe reactions post vaccine: [name] Kitchen Difficulty breathing  . Swelling of face and throat  . A fast heartbeat  . A bad rash all over body  . Dizziness and weakness   Immunizations Administered    Name Date Dose VIS Date Route   Pfizer COVID-19 Vaccine 11/14/2019  9:55 AM 0.3 mL 09/20/2018 Intramuscular   Manufacturer: [place]   Lot: [id number]   [place]: [id number]

## 2019-12-07 ENCOUNTER — Telehealth: Payer: Self-pay | Admitting: Internal Medicine

## 2019-12-07 MED ORDER — BUDESONIDE 0.25 MG/2ML IN SUSP: 0.2500 mg | Freq: Two times a day (BID) | RESPIRATORY_TRACT | 11 refills | Status: DC

## 2019-12-07 MED ORDER — PERFOROMIST 20 MCG/2ML IN NEBU: INHALATION_SOLUTION | RESPIRATORY_TRACT | 11 refills | Status: DC

## 2019-12-07 NOTE — Telephone Encounter (Signed)
Spoke with the pt  He is requesting that we sent his performist and budesonide to [facility]  Rxs sent  Nothing further needed

## 2019-12-07 NOTE — Telephone Encounter (Signed)
Attempted to call pt but unable to reach and unable to leave a VM. Will try to call back later. 

## 2019-12-22 ENCOUNTER — Other Ambulatory Visit: Payer: Self-pay | Admitting: Adult Health

## 2019-12-22 MED ORDER — MONTELUKAST SODIUM 10 MG PO TABS: 10.0000 mg | ORAL_TABLET | Freq: Every day | ORAL | 4 refills | Status: DC

## 2020-01-01 ENCOUNTER — Other Ambulatory Visit: Payer: Self-pay

## 2020-01-01 ENCOUNTER — Ambulatory Visit (INDEPENDENT_AMBULATORY_CARE_PROVIDER_SITE_OTHER): Payer: Medicare Other | Admitting: Internal Medicine

## 2020-01-01 ENCOUNTER — Encounter: Payer: Self-pay | Admitting: Internal Medicine

## 2020-01-01 DIAGNOSIS — J339 Nasal polyp, unspecified: Secondary | ICD-10-CM | POA: Diagnosis not present

## 2020-01-01 DIAGNOSIS — J45909 Unspecified asthma, uncomplicated: Secondary | ICD-10-CM | POA: Diagnosis not present

## 2020-01-01 DIAGNOSIS — Z886 Allergy status to analgesic agent status: Secondary | ICD-10-CM | POA: Diagnosis not present

## 2020-01-01 NOTE — Patient Instructions (Signed)
We will see if your are eligbile for Dupixent and call to arrange a trial or refer you to [facility] who will likely do the same   Please schedule a follow up visit in 6  months but call sooner if needed

## 2020-01-01 NOTE — Progress Notes (Signed)
Subjective:    Patient ID: Antonio Carlson, male    DOB: 08/06/1945   MRN: [id number]  Brief patient profile:  74 yowm quit smoking in 1971 with history of [facility] Asthma and hyperlipidemia and hbp   Brief patient profile:  06/23/2016 Carlson  Follow up : HTN, Hyperlipidemia/[facility] Asthma  Pt returns for 3 month follow up and med review .  We reviewed all his meds and organized them into a med calendar w/ pt education . He appears to be taking correctly except for flonase , has not been using on regular basis.  Complains of 1 week of nasal congestion , drainage, stuffiness, sneezing, and ear fullness. No fever or sinus pain. Minimal cough .   rec Prednisone taper as directed.  Follow med calendar closely and bring to each visit.  Restart Flonase         12/15/2018  f/u ov/Antonio Carlson re: triad asthma/ last prednisone  1st of May 2020/ hbp/ hyperlipidemia   Cc new chest burning with exertion new since Jan 2020   Dyspnea:  Not limited by breathing from desired activities  But slows down more than used to - avoid fast x 5 m is limit = MMRC1 = can walk nl pace, flat grade, can't hurry or go uphills or steps s sob   Cough: none  Sleeping: able to lie flat / on pillow SABA use: rarely rec Our Antonio Carlson will contact you for cardiology referral for new exercise related chest discomfort  Please remember to go to the lab and x-ray department   for your tests - we will call you with the results when they are available.   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 Follow-up    Patient reports that his breathing and cough are doing well at this time.  Dyspnea: no change with  occ cp with exertion > cards eval in progress , no change pattern  Cough: none now  Sleeping: able to lie flat/ one pillow SABA use:  none 02: none  rec No change rx    10/09/2019  f/u ov/Antonio Carlson re: triad asthma/ last pred Dec 2020 for back not breathing  Chief Complaint  Patient presents with  . Follow-up    Breathing is doing well and he has not been using his albuterol. He has occ cough that he related to sinus drainage. Also c/o nasal congestion.   Dyspnea:  Nl pace flat surface fine = MMRC1 = can walk nl pace, flat grade, can't hurry or go uphills or steps s sob   -walking up to 30 min daily  Cough: assoc with nasal congestion/some slt discolored mucus  Sleeping: able to lie flat/one pillow  SABA use: very rarely  02: none  rec Plan A = Automatic = Always=    Budesonide/ Performist twice daily per nebulizer  Plan B = Backup (to supplement plan A, not to replace it) Only use your albuterol(yellow is proventil)  inhaler Plan C = Crisis - if Plan B stops working or you need more of it than usual - Prednisone 10 mg take  4 each am x 2 days,   2 each am x 2 days,  1 each am x 2 days and stop  Stop zestoril = lisinopril  Start micardis (telmasartan) 80 mg one half daily if blood pressure not at goal take a whole pill daily  See Antonio Carlson w/in 3 months with all your medications,   Move up Antonio Carlson f/u to 2 weeks to recheck K and add hctz if elevated (eg micardis 40-12.5)    01/01/2020  f/u ov/Antonio Carlson re: triad asthma - using pred approx 5 cycles  x a year, last done one week prior to [place]  Patient presents with  . Follow-up    2 month f/u for asthma. States his breathing has been great since last visit. Denies any current issues.   Dyspnea:  Not limited by breathing from desired activities  /  Cough: just with pnds  Sleeping: flat bed / one pillow SABA use: none  02: none      No obvious day to day or daytime variability or assoc excess/ purulent sputum or mucus plugs or hemoptysis or cp or chest tightness, subjective wheeze or overt   hb symptoms.   Sleeping without nocturnal  or early am  exacerbation  of respiratory  c/o's or need for noct saba. Also denies any obvious fluctuation of symptoms with weather or environmental changes or other aggravating or alleviating factors except as outlined above   No unusual exposure hx or h/o childhood pna/ asthma or knowledge of premature birth.  Current Allergies, Complete Past Medical History, Past Surgical History, Family History, and Social History were reviewed in [employer] [employer] record.  ROS  The following are not active complaints unless bolded Hoarseness, sore throat, dysphagia, dental problems, itching, sneezing,  nasal congestion or discharge of excess mucus or purulent secretions, ear ache,   fever, chills, sweats, unintended wt loss or wt gain, classically pleuritic or exertional cp,  orthopnea pnd or arm/hand swelling  or leg swelling, presyncope, palpitations, abdominal pain, anorexia, nausea, vomiting, diarrhea  or change in bowel habits or change in bladder habits, change in stools or change in urine, dysuria, hematuria,  rash, arthralgias, visual complaints, headache, numbness, weakness or ataxia or problems with walking or coordination,  change in mood or  memory.        Current Meds  Medication Sig  . albuterol (VENTOLIN HFA) 108 (90 Base) MCG/ACT inhaler Inhale 2 puffs into the lungs every 6 (six) hours as needed for wheezing or shortness of breath.  [name] Kitchen atorvastatin (LIPITOR) 40 MG tablet Take 1 tablet (40 mg total) by mouth daily at 6 PM.  . budesonide (PULMICORT) 0.25 MG/2ML nebulizer solution Take 2 mLs (0.25 mg total) by nebulization 2 (two) times daily.  . calcium citrate-vitamin D 500-400 MG-UNIT chewable tablet Chew 1 tablet by mouth daily.  . clopidogrel (PLAVIX) 75 MG tablet Take 1 tablet (75 mg total) by mouth daily.  . diclofenac Sodium (VOLTAREN) 1 % GEL Apply 2 g topically 4 (four) times daily.  . formoterol (PERFOROMIST) 20 MCG/2ML nebulizer solution USE 2 ML IN NEBULIZER  TWICE DAILY  .  metoprolol tartrate (LOPRESSOR) 50 MG tablet Take 1/2 of tablet 2 times a day.  . montelukast (SINGULAIR) 10 MG tablet Take 1 tablet (10 mg total) by mouth at bedtime.  . Multiple Vitamins-Minerals (CENTRUM) tablet Take 1 tablet by mouth daily.    [name] Kitchen omeprazole (PRILOSEC) 20 MG capsule Take 1 capsule (20 mg total) by mouth daily.  [name] Kitchen telmisartan (MICARDIS) 80 MG tablet Take 1 tablet (80 mg total) by mouth daily.  . [DISCONTINUED] tiZANidine (ZANAFLEX) 2 MG tablet Take 1  tablet (2 mg total) by mouth at bedtime.            PMH PVCs.  Syncope  - Holter ordered June 27, 2008 > nl  - EP consult June 27, 2008  OSTEOPENIA (ICD-733.90)  COLONIC POLYPS (ICD-211.3) .......................................[name] Kitchen [name] - see colonoscopy 11/07/10 (repeat in 10 yr)  DIVERTICULOSIS, MILD (ICD-562.10) ASTHMA (ICD-493.90)  - HFA 90% June 11, 2009  ALLERGIC RHINITIS, CHRONIC (ICD-477.9)  - steroid dep until mid oct 2009  - Allergy profile sent January 23, 2010 >> IgE 18.3  - Polypectomy Sept 2012 ......................................................  [name]/ [place]  BENIGN PROSTATIC HYPERTROPHY, HX OF (ICD-V13.8) .[name] Kitchen... 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 [place].......................................................[name] KitchenWert  R C6/7 radiculopathy 2015 ....................................................... [name]  - CPX 12/15/2018  - Pneumovax 11/2004 and @ age 26 June 11, 2009 and Prevnar 13 08/14/2013  - Td 07/2005   And  08/01/2015  - 2nd Covid May 2021        Past Surgical History:  Appendectomy  Colonoscopy     Family History:  heart disease in his father onset at age 35 he was a smoker  Ca brain half brother  Half siblings  - one lung ca, one breast ca, one brain ca Mother dementia onset late  27s lived to be 43    Social History:  quit smoking 1971  rarely drink alcohol  Retired  Widower 2018  - remarried sept 2020        Objective:   Physical Exam  01/01/2020      175  10/09/2019    170   03/16/2019   179  wt 198 May 17, 2008 >  > 192 07/17/11 >  02/05/2012  191> 05/10/2012  188> 08/10/2012 190> 11/14/2012 180>182 12/12/2012 > 03/14/2013 182 > 07/06/2013 185 > 08/14/2013  184 > 01/31/2014  176 >176 .03/22/2014 >  05/31/2014   181 > 06/11/14  179 > 08/20/2014   184> 02/18/2015  181 > 08/01/2015  178 > 09/16/2015 182  > 12/16/2015   185 > 03/17/2016  185> 08/07/2016   187  > 10/22/2016    183 > 01/22/2017   180> 07/29/2017   177 > 10/28/2017  169 >  06/16/2018  175 > 12/15/2018  182 >   amb wm nad  Vital signs reviewed  01/01/2020  - Note at rest 02 sats  99% on RA and BP 136/80      HEENT : pt wearing mask not removed for exam due to covid -19 concerns.    NECK :  without JVD/Nodes/TM/ nl carotid upstrokes bilaterally   LUNGS: no acc muscle use,  Nl contour chest with faint exp wheeze  bilaterally without cough on insp or exp maneuvers   CV:  RRR  no s3 or murmur or increase in P2, and no edema   ABD:  soft and nontender with nl inspiratory excursion in the supine position. No bruits or organomegaly appreciated, bowel sounds nl  MS:  Nl gait/ ext warm without deformities, calf tenderness, cyanosis or clubbing No obvious joint restrictions   SKIN: warm and dry without lesions    NEURO:  alert, approp, nl sensorium with  no motor or cerebellar deficits apparent.      [name] Kitchen

## 2020-01-03 ENCOUNTER — Encounter: Payer: Self-pay | Admitting: Internal Medicine

## 2020-01-03 NOTE — Assessment & Plan Note (Addendum)
Quit smoking 1971  - PFT's  09/16/2015  FEV1 1.81 (61 % ) ratio 56  p 44 % improvement from saba with DLCO  81/81c % corrects to 107  % for alv volume  Done prior to any am meds  - 06/16/2018  After extensive coaching inhaler device,  effectiveness =    90%  - 10/2018 rx prednisone as plan C x 6 days - Allergy profile 10/09/19   >  Eos 0.9 /  IgE  106  Remains difficult to control with freq courses of prednisone while on max rx with neb bud /perforomist/  singulair assoc with poorly controlled rhinitis and h/o nasal polyps > strongly rec dupixent trial at this point up to 6 months if eligible per insurance.          Each maintenance medication was reviewed in detail including emphasizing most importantly the difference between maintenance and prns and under what circumstances the prns are to be triggered using an action plan format where appropriate.  Total time for H and P, chart review, counseling, teaching device and generating customized AVS unique to this office visit / charting = 20 min

## 2020-01-09 ENCOUNTER — Ambulatory Visit: Payer: Medicare Other | Admitting: Adult Health

## 2020-01-30 ENCOUNTER — Ambulatory Visit: Payer: Medicare Other | Admitting: Internal Medicine

## 2020-02-05 ENCOUNTER — Telehealth: Payer: Self-pay | Admitting: Internal Medicine

## 2020-02-05 MED ORDER — FORMOTEROL FUMARATE 20 MCG/2ML IN NEBU: INHALATION_SOLUTION | RESPIRATORY_TRACT | 11 refills | Status: DC

## 2020-02-05 NOTE — Telephone Encounter (Signed)
Called and spoke with pt who stated an Rx needs to be sent in specifying neb sol needs to be name brand for Perforomist as Medicare is refusing to pay for generic version due to finding out that they do not have one avail yet. Pt stated that we need to send in new Rx and specify that it needs to be name brand only. rx resent to pharmacy specifying that it must be name brand. Nothing further needed.

## 2020-02-06 ENCOUNTER — Telehealth: Payer: Self-pay | Admitting: Internal Medicine

## 2020-02-06 NOTE — Telephone Encounter (Signed)
Per our records, dx code is noted within Rx that was prescribed on 02/06/2020. I have spoken to Katrina with [facility], who was able to locate this information within Rx. Lm to make pt aware.

## 2020-02-12 ENCOUNTER — Other Ambulatory Visit: Payer: Self-pay

## 2020-02-12 ENCOUNTER — Ambulatory Visit (INDEPENDENT_AMBULATORY_CARE_PROVIDER_SITE_OTHER): Payer: Medicare Other | Admitting: Internal Medicine

## 2020-02-12 ENCOUNTER — Encounter: Payer: Self-pay | Admitting: Internal Medicine

## 2020-02-12 VITALS — BP 137/81 | HR 65 | Temp 96.6°F | Ht 68.0 in | Wt 175.0 lb

## 2020-02-12 DIAGNOSIS — I480 Paroxysmal atrial fibrillation: Secondary | ICD-10-CM

## 2020-02-12 DIAGNOSIS — R0602 Shortness of breath: Secondary | ICD-10-CM | POA: Diagnosis not present

## 2020-02-12 DIAGNOSIS — Z951 Presence of aortocoronary bypass graft: Secondary | ICD-10-CM | POA: Diagnosis not present

## 2020-02-12 DIAGNOSIS — E785 Hyperlipidemia, unspecified: Secondary | ICD-10-CM

## 2020-02-12 DIAGNOSIS — I10 Essential (primary) hypertension: Secondary | ICD-10-CM | POA: Diagnosis not present

## 2020-02-12 NOTE — Progress Notes (Signed)
OFFICE CONSULT NOTE  Chief Complaint:  Follow-up CABG  Primary Care Physician: [name], MD  HPI:  Antonio Carlson is a 74 y.o. male who is being seen today for the evaluation of chest pain at the request of [name], MD. This is a pleasant 74 year old male patient of Dr. Melvyn Novas he is been followed for asthma for some time.  He has a history of hypertension, dyslipidemia and PVCs in the past.  Family history significant for his father who had an MI in his 37s.  He was a previously a smoker but quit in the 1970s.  Since about January has had some exertional burning in his chest.  Is worse with exercise and improves at rest.  It does not radiate.  His symptoms have been fairly stable.  He says it does not always happen with exertion.  It was felt that some of the symptoms were out of proportion for his lung disease.  His dyslipidemia is well managed with total cholesterol 181, triglycerides 138, HDL 51 and LDL 102 on 40 mg simvastatin.  He is not on aspirin due to anaphylactic reaction.  He also has hypertension and is on a beta-blocker.  Heart rate is in the low 60s.  Blood pressures well controlled today.  EKG was performed the office shows sinus rhythm with sinus arrhythmia at 61.  Antonio Carlson is an avid hunter but also notes that when he walks a certain distance he gets pain in both of the buttocks and upper thighs and has to stop.  Once he stops the symptoms get better and then he gets up and goes on again.  He has had x-rays of his hips which showed some mild osteoarthritis but no significant findings to explain his pain.  06/27/2019  Antonio Carlson returns today for follow-up of coronary artery bypass grafting which was in early October.  He had four-vessel CABG with LIMA to LAD, RIMA to PDA, right radial to distal obtuse marginal and ramus intermedius on May 01, 2019.  He did very well except for postoperative A. fib with RVR requiring amiodarone and spontaneously converted to sinus.   He was placed on Eliquis and amiodarone however he could not afford the Eliquis and discontinued it fairly quickly.  He was seen in follow-up and has since been seen by Dr. Orvan Seen who operated on him.  His anticoagulation was discontinued but he remains on amiodarone.  He was cleared to start driving.  According to Antonio Carlson he is doing fairly well except that he has been having some cough and left sided lower chest discomfort in the back.  Is worse sometimes with change in position or taking deep breaths.  This was brought up but not felt to be significant.  He plans to go to [place] where he stays for typically 3 months.  He would like to do cardiac rehabilitation at hospital there that is about 15 minutes from his house.  He denies any recurrent palpitations or A. fib.  EKG today shows sinus rhythm.  He did have recent labs show total cholesterol 153, triglycerides 142, HDL 45 and LDL of 83.  Previously he was on simvastatin but changed to atorvastatin 40 mg daily.  02/12/2020  Antonio Carlson is seen today in follow-up.  He continues to do well now a ways out from his CABG.  Blood pressure is well controlled.  He has been followed by pulmonary and has had some improvement in his breathing.  His pleural  effusions have resolved.  Cholesterol seems to be reasonably well controlled however LDL was still 102.  This was as of May 2020.  His target LDL is less than 70.  EKG shows sinus rhythm at 65.  PMHx:  Past Medical History:  Diagnosis Date  . Allergy   . Anginal pain ([place])   . Arthritis   . Asthma   . Benign prostatic hypertrophy   . Coronary artery disease   . Diverticulosis   . Dyspnea   . GERD (gastroesophageal reflux disease)   . History of kidney stones   . Hyperlipidemia   . Hypertension   . Nephrolithiasis   . PVC's (premature ventricular contractions)   . Syncope     Past Surgical History:  Procedure Laterality Date  . APPENDECTOMY    . CORONARY ARTERY BYPASS GRAFT N/A 05/01/2019    Procedure: CORONARY ARTERY BYPASS GRAFTING (CABG) TIMES FOUR USING LEFT AND RIGHT INTERNAL MAMMARY ARTERIES AND RIGHT RADIAL ARTERY WITH STERNAL PLATING;  Surgeon: [name], MD;  Location: [facility];  Service: Open Heart Surgery;  Laterality: N/A;  . LEFT HEART CATH AND CORONARY ANGIOGRAPHY N/A 04/18/2019   Procedure: LEFT HEART CATH AND CORONARY ANGIOGRAPHY;  Surgeon: Belva Crome, MD;  Location: [facility];  Service: Cardiovascular;  Laterality: N/A;  . NASAL SINUS SURGERY  Sept 2012  . RADIAL ARTERY HARVEST Right 05/01/2019   Procedure: Right Radial Artery Harvest;  Surgeon: [name], MD;  Location: [facility];  Service: Open Heart Surgery;  Laterality: Right;  . TEE WITHOUT CARDIOVERSION N/A 05/01/2019   Procedure: TRANSESOPHAGEAL ECHOCARDIOGRAM (TEE);  Surgeon: [name], MD;  Location: [facility];  Service: Open Heart Surgery;  Laterality: N/A;    FAMHx:  Family History  Problem Relation Age of Onset  . Heart attack Father     SOCHx:   reports that he quit smoking about 50 years ago. He has a 28.00 pack-year smoking history. He has never used smokeless tobacco. He reports current alcohol use. He reports that he does not use drugs.  ALLERGIES:  Allergies  Allergen Reactions  . Aspirin Shortness Of Breath  . Amoxicillin     GI upset Did it involve swelling of the face/tongue/throat, SOB, or low BP? No Did it involve sudden or severe rash/hives, skin peeling, or any reaction on the inside of your mouth or nose? No Did you need to seek medical attention at a hospital or doctor's office? No When did it last happen?20+ years If all above answers are "NO", may proceed with cephalosporin use.   . Sulfonamide Derivatives Other (See Comments)    Doesn't remember    ROS: Pertinent items noted in HPI and remainder of comprehensive ROS otherwise negative.  HOME MEDS: Current Outpatient Medications on File Prior to Visit  Medication Sig Dispense Refill  .  albuterol (VENTOLIN HFA) 108 (90 Base) MCG/ACT inhaler Inhale 2 puffs into the lungs every 6 (six) hours as needed for wheezing or shortness of breath.    [name] Kitchen atorvastatin (LIPITOR) 40 MG tablet Take 1 tablet (40 mg total) by mouth daily at 6 PM. 90 tablet 3  . budesonide (PULMICORT) 0.25 MG/2ML nebulizer solution Take 2 mLs (0.25 mg total) by nebulization 2 (two) times daily. 120 mL 11  . calcium citrate-vitamin D 500-400 MG-UNIT chewable tablet Chew 1 tablet by mouth daily.    . clopidogrel (PLAVIX) 75 MG tablet Take 1 tablet (75 mg total) by mouth daily. 90 tablet 3  .  diclofenac Sodium (VOLTAREN) 1 % GEL Apply 2 g topically 4 (four) times daily. 150 g 0  . formoterol (PERFOROMIST) 20 MCG/2ML nebulizer solution USE 2 ML IN NEBULIZER  TWICE DAILY 120 mL 11  . metoprolol tartrate (LOPRESSOR) 50 MG tablet Take 1/2 of tablet 2 times a day.    . montelukast (SINGULAIR) 10 MG tablet Take 1 tablet (10 mg total) by mouth at bedtime. 90 tablet 4  . Multiple Vitamins-Minerals (CENTRUM) tablet Take 1 tablet by mouth daily.      [name] Kitchen omeprazole (PRILOSEC) 20 MG capsule Take 1 capsule (20 mg total) by mouth daily. 90 capsule 3  . telmisartan (MICARDIS) 80 MG tablet Take 1 tablet (80 mg total) by mouth daily. 30 tablet 11  . [DISCONTINUED] rosuvastatin (CRESTOR) 20 MG tablet Take 1 tablet (20 mg total) by mouth at bedtime. 30 tablet 1   No current facility-administered medications on file prior to visit.    LABS/IMAGING: No results found for this or any previous visit (from the past 48 hour(s)). No results found.  LIPID PANEL:    Component Value Date/Time   CHOL 153 05/29/2019 1055   TRIG 142 05/29/2019 1055   HDL 45 05/29/2019 1055   CHOLHDL 3.4 05/29/2019 1055   CHOLHDL 4 12/15/2018 1140   VLDL 27.6 12/15/2018 1140   LDLCALC 83 05/29/2019 1055   LDLDIRECT 122.0 08/20/2014 0939    WEIGHTS: Wt Readings from Last 3 Encounters:  02/12/20 175 lb (79.4 kg)  01/01/20 175 lb 6.4 oz (79.6 kg)    10/31/19 172 lb (78 kg)    VITALS: BP 137/81   Pulse 65   Temp (!) 96.6 F (35.9 C)   Ht 5\' 8"  (1.727 m)   Wt 175 lb (79.4 kg)   SpO2 96%   BMI 26.61 kg/m   EXAM: General appearance: alert and no distress Neck: no carotid bruit, no JVD and thyroid not enlarged, symmetric, no tenderness/mass/nodules Lungs: diminished breath sounds LLL Heart: regular rate and rhythm, S1, S2 normal, no murmur, click, rub or gallop Abdomen: soft, non-tender; bowel sounds normal; no masses,  no organomegaly Extremities: extremities normal, atraumatic, no cyanosis or edema and varicose veins noted Pulses: normal LE pulses Skin: Skin color, texture, turgor normal. No rashes or lesions Neurologic: Grossly normal Psych: Pleasant  EKG: Normal sinus rhythm 65-personally reviewed  ASSESSMENT: 1. Coronary artery disease status post four-vessel CABG (05/01/2019-LIMA to LAD, RIMA to PDA, right radial to distal obtuse marginal, and radial Ms. intermedius of the left circumflex) Dr. Orvan Seen 2. Asthma 3. Hypertension 4. Dyslipidemia 5. Bilateral hip pain 6. Asymptomatic bilateral varicose veins  PLAN: 1.   Antonio Carlson is doing well after CABG in 2020.  He denies any worsening shortness of breath or chest discomfort.  His pleural effusions have resolved.  LDL was 102 in May.  He may need further optimization of his medications.  He is on atorvastatin 40.  Otherwise no other medication changes today.  Follow-up with me annually or sooner as necessary.  Pixie Casino, MD, [facility], [place] Director of the [facility]  Card[facility] Diplomate of the American Board of Clinical Lipidology Attending Cardiologist  Direct Dial: [phone]  Fax: [phone]  Website:  [email or web address] 02/12/2020, 9:37 AM

## 2020-02-12 NOTE — Patient Instructions (Signed)

## 2020-02-23 ENCOUNTER — Other Ambulatory Visit: Payer: Self-pay | Admitting: Physician Assistant

## 2020-04-10 DIAGNOSIS — H52202 Unspecified astigmatism, left eye: Secondary | ICD-10-CM | POA: Diagnosis not present

## 2020-04-10 DIAGNOSIS — Z961 Presence of intraocular lens: Secondary | ICD-10-CM | POA: Diagnosis not present

## 2020-04-10 DIAGNOSIS — H524 Presbyopia: Secondary | ICD-10-CM | POA: Diagnosis not present

## 2020-05-02 ENCOUNTER — Other Ambulatory Visit: Payer: Self-pay | Admitting: Internal Medicine

## 2020-05-02 ENCOUNTER — Telehealth: Payer: Self-pay | Admitting: Internal Medicine

## 2020-05-02 MED ORDER — OMEPRAZOLE 20 MG PO CPDR: 20.0000 mg | DELAYED_RELEASE_CAPSULE | Freq: Every day | ORAL | 0 refills | Status: DC

## 2020-05-02 MED ORDER — BUDESONIDE 0.25 MG/2ML IN SUSP: 0.2500 mg | Freq: Two times a day (BID) | RESPIRATORY_TRACT | 2 refills | Status: DC

## 2020-05-02 NOTE — Telephone Encounter (Signed)
Spoke with pt. He is needing refills on Budesonide and Omeprazole. These both have been sent in. Nothing further was needed.

## 2020-06-04 DIAGNOSIS — Z23 Encounter for immunization: Secondary | ICD-10-CM | POA: Diagnosis not present

## 2020-07-01 ENCOUNTER — Other Ambulatory Visit: Payer: Self-pay

## 2020-07-01 ENCOUNTER — Encounter: Payer: Self-pay | Admitting: Internal Medicine

## 2020-07-01 ENCOUNTER — Ambulatory Visit (INDEPENDENT_AMBULATORY_CARE_PROVIDER_SITE_OTHER): Payer: Medicare Other | Admitting: Internal Medicine

## 2020-07-01 DIAGNOSIS — Z886 Allergy status to analgesic agent status: Secondary | ICD-10-CM

## 2020-07-01 DIAGNOSIS — I10 Essential (primary) hypertension: Secondary | ICD-10-CM | POA: Diagnosis not present

## 2020-07-01 DIAGNOSIS — E785 Hyperlipidemia, unspecified: Secondary | ICD-10-CM | POA: Diagnosis not present

## 2020-07-01 DIAGNOSIS — J302 Other seasonal allergic rhinitis: Secondary | ICD-10-CM | POA: Diagnosis not present

## 2020-07-01 DIAGNOSIS — J339 Nasal polyp, unspecified: Secondary | ICD-10-CM | POA: Diagnosis not present

## 2020-07-01 DIAGNOSIS — J45909 Unspecified asthma, uncomplicated: Secondary | ICD-10-CM

## 2020-07-01 LAB — BASIC METABOLIC PANEL
BUN: 18 mg/dL (ref 6–23)
CO2: 31 mEq/L (ref 19–32)
Calcium: 9.3 mg/dL (ref 8.4–10.5)
Chloride: 100 mEq/L (ref 96–112)
Creatinine, Ser: 1.05 mg/dL (ref 0.40–1.50)
GFR: 68.77 mL/min (ref >60.00)
Glucose, Bld: 88 mg/dL (ref 70–99)
Potassium: 5.1 mEq/L (ref 3.5–5.1)
Sodium: 140 mEq/L (ref 135–145)

## 2020-07-01 LAB — CBC WITH DIFFERENTIAL/PLATELET
Basophils Absolute: 0.1 10*3/uL (ref 0.0–0.1)
Basophils Relative: 1.1 % (ref 0.0–3.0)
Eosinophils Absolute: 0.8 10*3/uL — ABNORMAL HIGH (ref 0.0–0.7)
Eosinophils Relative: 8.1 % — ABNORMAL HIGH (ref 0.0–5.0)
HCT: 47 % (ref 39.0–52.0)
Hemoglobin: 15.8 g/dL (ref 13.0–17.0)
Lymphocytes Relative: 20 % (ref 12.0–46.0)
Lymphs Abs: 1.9 10*3/uL (ref 0.7–4.0)
MCHC: 33.6 g/dL (ref 30.0–36.0)
MCV: 82.7 fl (ref 78.0–100.0)
Monocytes Absolute: 1.2 10*3/uL — ABNORMAL HIGH (ref 0.1–1.0)
Monocytes Relative: 12.4 % — ABNORMAL HIGH (ref 3.0–12.0)
Neutro Abs: 5.6 10*3/uL (ref 1.4–7.7)
Neutrophils Relative %: 58.4 % (ref 43.0–77.0)
Platelets: 332 10*3/uL (ref 150.0–400.0)
RBC: 5.69 Mil/uL (ref 4.22–5.81)
RDW: 15 % (ref 11.5–15.5)
WBC: 9.6 10*3/uL (ref 4.0–10.5)

## 2020-07-01 LAB — LIPID PANEL
Cholesterol: 161 mg/dL (ref 0–200)
HDL: 36 mg/dL — ABNORMAL LOW (ref >39.00)
LDL Cholesterol: 104 mg/dL — ABNORMAL HIGH (ref 0–99)
NonHDL: 124.65
Total CHOL/HDL Ratio: 4
Triglycerides: 104 mg/dL (ref 0.0–149.0)
VLDL: 20.8 mg/dL (ref 0.0–40.0)

## 2020-07-01 LAB — TSH: TSH: 1.2 u[IU]/mL (ref 0.35–4.50)

## 2020-07-01 LAB — HEPATIC FUNCTION PANEL
ALT: 17 U/L (ref 0–53)
AST: 20 U/L (ref 0–37)
Albumin: 3.9 g/dL (ref 3.5–5.2)
Alkaline Phosphatase: 142 U/L — ABNORMAL HIGH (ref 39–117)
Bilirubin, Direct: 0.1 mg/dL (ref 0.0–0.3)
Total Bilirubin: 0.8 mg/dL (ref 0.2–1.2)
Total Protein: 6.5 g/dL (ref 6.0–8.3)

## 2020-07-01 NOTE — Progress Notes (Signed)
Subjective:    Patient ID: Antonio Carlson, male    DOB: January 03, 1946   MRN: [id number]  Brief patient profile:  74 yowm quit smoking in 1971 with history of [facility] and hyperlipidemia and hbp    Brief patient profile:  76/28/2017 NP  Follow up : HTN, Hyperlipidemia/[facility]  Pt returns for 3 month follow up and med review .  We reviewed all his meds and organized them into a med calendar w/ pt education . He appears to be taking correctly except for flonase , has not been using on regular basis.  Complains of 1 week of nasal congestion , drainage, stuffiness, sneezing, and ear fullness. No fever or sinus pain. Minimal cough .   rec Prednisone taper as directed.  Follow med calendar closely and bring to each visit.  Restart Flonase    12/15/2018  f/u ov/Merisa Julio re: [facility] asthma/ last prednisone  1st of May 2020/ hbp/ hyperlipidemia   Cc new chest burning with exertion new since Jan 2020   Dyspnea:  Not limited by breathing from desired activities  But slows down more than used to - avoid fast x 5 m is limit = MMRC1 = can walk nl pace, flat grade, can't hurry or go uphills or steps s sob   Cough: none  Sleeping: able to lie flat / on pillow SABA use: rarely rec [facility] will contact you for cardiology referral for new exercise related chest discomfort  Please remember to go to the lab and x-ray department   for your tests - we will call you with the results when they are available.   Please schedule a follow up visit in 3 months but call sooner if needed  - consider refer for Dupixent if freq flares needing prednisone    03/16/2019  f/u ov/Lucie Friedlander re:  [facility]/ last pred in June 2020  Chief Complaint  Patient presents with  . Follow-up    Patient reports that his breathing and cough are doing well at this time.  Dyspnea: no change with  occ cp with exertion > cards eval in progress , no change pattern  Cough: none now  Sleeping: able to lie flat/ one pillow SABA use:  none 02: none  rec No change rx    10/09/2019  f/u ov/Kadance Mccuistion re: [facility] asthma/ last pred Dec 2020 for back not breathing  Chief Complaint  Patient presents with  . Follow-up    Breathing is doing well and he has not been using his albuterol. He has occ cough that he related to sinus drainage. Also c/o nasal congestion.   Dyspnea:  Nl pace flat surface fine = MMRC1 = can walk nl pace, flat grade, can't hurry or go uphills or steps s sob   -walking up to 30 min daily  Cough: assoc with nasal congestion/some slt discolored mucus  Sleeping: able to lie flat/one pillow  SABA use: very rarely  02: none  rec Plan A = Automatic = Always=    Budesonide/ Performist twice daily per nebulizer  Plan B = Backup (to supplement plan A, not to replace it) Only use your albuterol(yellow is proventil)  inhaler Plan C = Crisis - if Plan B stops working or you need more of it than usual - Prednisone 10 mg take  4 each am x 2 days,   2 each am x 2 days,  1 each am x 2 days and stop  Stop zestoril = lisinopril  Start micardis (telmasartan)  80 mg one half daily if blood pressure not at goal take a whole pill daily  See [name] w/in 3 months with all your medications,   Move up [name] f/u to 2 weeks to recheck K and add hctz if elevated (eg micardis 40-12.5)    01/01/2020  f/u ov/Dorea Duff re: [facility] asthma - using pred approx 5 cycles  x a year, last done one week prior to [place]  Patient presents with  . Follow-up    2 month f/u for asthma. States his breathing has been great since last visit. Denies any current issues.   Dyspnea:  Not limited by breathing from desired activities  /  Cough: just with pnds  Sleeping: flat bed / one pillow SABA use: none  02: none  rec We will see if your are eligbile for Dupixent and call to arrange a trial or refer you to [facility] who will likely do the same> did not happen    07/01/2020  f/u ov/Norma Montemurro re:  [facility] on performist and budesonide per part B  free / singulair also  Chief Complaint  Patient presents with  . Follow-up    sinus drainage making him cough   Dyspnea:  Not limited by breathing from desired activities  - yardwork / huntig  Cough: worse x 1 weeks / min clear Sleeping: bothered by nasal congestion/ drainage /  SABA use: none but prednisone 1st of nov 2021 and before that in sept  02: none    No obvious day to day or daytime variability or assoc   purulent sputum or mucus plugs or hemoptysis or cp or chest tightness, subjective wheeze or overt sinus or hb symptoms.    Also denies any obvious fluctuation of symptoms with weather or environmental changes or other aggravating or alleviating factors except as outlined above   No unusual exposure hx or h/o childhood pna/ asthma or knowledge of premature birth.  Current Allergies, Complete Past Medical History, Past Surgical History, Family History, and Social History were reviewed in [employer] record.  ROS  The following are not active complaints unless bolded Hoarseness, sore throat, dysphagia, dental problems, itching, sneezing,  nasal congestion or discharge of excess mucus or purulent secretions, ear ache,   fever, chills, sweats, unintended wt loss or wt gain, classically pleuritic or exertional cp,  orthopnea pnd or arm/hand swelling  or leg swelling, presyncope, palpitations, abdominal pain, anorexia, nausea, vomiting, diarrhea  or change in bowel habits or change in bladder habits, change in stools or change in urine, dysuria, hematuria,  rash, arthralgias, visual complaints, headache, numbness, weakness or ataxia or problems with walking or coordination,  change in mood or  memory.        Current Meds  Medication Sig  . albuterol (VENTOLIN HFA) 108 (90 Base) MCG/ACT inhaler Inhale 2 puffs into the lungs every 6 (six) hours as needed for wheezing or shortness of breath.  [name] Kitchen atorvastatin (LIPITOR) 40 MG tablet Take 1 tablet (40 mg total) by mouth  daily at 6 PM.  . budesonide (PULMICORT) 0.25 MG/2ML nebulizer solution USE 1 VIAL IN NEBULIZER TWICE DAILY  . calcium citrate-vitamin D 500-400 MG-UNIT chewable tablet Chew 1 tablet by mouth daily.  . clopidogrel (PLAVIX) 75 MG tablet Take 1 tablet (75 mg total) by mouth daily.  . diclofenac Sodium (VOLTAREN) 1 % GEL Apply 2 g topically 4 (four) times daily.  . formoterol (PERFOROMIST) 20 MCG/2ML nebulizer solution USE 2  ML IN NEBULIZER  TWICE DAILY  . metoprolol tartrate (LOPRESSOR) 50 MG tablet Take 1/2 of tablet 2 times a day.  . montelukast (SINGULAIR) 10 MG tablet Take 1 tablet (10 mg total) by mouth at bedtime.  . Multiple Vitamins-Minerals (CENTRUM) tablet Take 1 tablet by mouth daily.    [name] Kitchen omeprazole (PRILOSEC) 20 MG capsule Take 1 capsule by mouth once daily  . predniSONE (DELTASONE) 10 MG tablet Take by mouth.  . telmisartan (MICARDIS) 80 MG tablet Take 1 tablet (80 mg total) by mouth daily.              PMH PVCs.  Syncope  - Holter ordered June 27, 2008 > nl  - EP consult June 27, 2008  OSTEOPENIA (ICD-733.90)  COLONIC POLYPS (ICD-211.3) .......................................[name] Kitchen [name] - see colonoscopy 11/07/10 (repeat in 10 yr)  DIVERTICULOSIS, MILD (ICD-562.10) ASTHMA (ICD-493.90)  - HFA 90% June 11, 2009  ALLERGIC RHINITIS, CHRONIC (ICD-477.9)  - steroid dep until mid oct 2009  - Allergy profile sent January 23, 2010 >> IgE 18.3  - Polypectomy Sept 2012 ......................................................  [name]/ [place]  BENIGN PROSTATIC HYPERTROPHY, HX OF (ICD-V13.8) .[name] Kitchen... 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 [place].......................................................[name] KitchenWert  R C6/7 radiculopathy 2015 ....................................................... [name]   - CPX 07/01/2020  - Pneumovax 11/2004 and @ age 21 June 11, 2009 and Prevnar 13 08/14/2013  - Td 07/2005   And  08/01/2015  - 2nd Covid May 2021        Past Surgical History:  Appendectomy  Colonoscopy     Family History:  heart disease in his father onset at age 32 he was a smoker  Ca brain half brother  Half siblings  - one lung ca, one breast ca, one brain ca Mother dementia onset late 28s lived to be 1    Social History:  quit smoking 1971  rarely drink alcohol  Retired  Widower 2018  - remarried sept 2020        Objective:   Physical Exam   07/01/2020   177 01/01/2020      175  10/09/2019    170   03/16/2019   179  wt 198 May 17, 2008 >  > 192 07/17/11 >  02/05/2012  191> 05/10/2012  188> 08/10/2012 190> 11/14/2012 180>182 12/12/2012 > 03/14/2013 182 > 07/06/2013 185 > 08/14/2013  184 > 01/31/2014  176 >176 .03/22/2014 >  05/31/2014   181 > 06/11/14  179 > 08/20/2014   184> 02/18/2015  181 > 08/01/2015  178 > 09/16/2015 182  > 12/16/2015   185 > 03/17/2016  185> 08/07/2016   187  > 10/22/2016    183 > 01/22/2017   180> 07/29/2017   177 > 10/28/2017  169 >  06/16/2018  175 > 12/15/2018  182 >   Robust amb wm/ nasal tone to voice    HEENT : pt wearing mask not removed for exam due to covid -19 concerns.    NECK :  without JVD/Nodes/TM/ nl carotid upstrokes bilaterally   LUNGS: no acc muscle use,  Nl contour chest with insp/exp sonorous rhonchi bilaterally without cough on insp or exp maneuvers   CV:  RRR  no s3 or murmur or increase in P2, and no edema   ABD:  soft and nontender with nl inspiratory excursion in the supine position. No bruits or organomegaly appreciated, bowel sounds nl  MS:  Nl  gait/ ext warm without deformities, calf tenderness, cyanosis or clubbing No obvious joint restrictions   SKIN: warm and dry without lesions    NEURO:  alert, approp, nl sensorium with  no motor or cerebellar deficits apparent.       Labs ordered/ reviewed:      Chemistry       Component Value Date/Time   NA 140 07/01/2020 1041   NA 143 05/29/2019 1055   K 5.1 07/01/2020 1041   CL 100 07/01/2020 1041   CO2 31 07/01/2020 1041   BUN 18 07/01/2020 1041   BUN 17 05/29/2019 1055   CREATININE 1.05 07/01/2020 1041      Component Value Date/Time   CALCIUM 9.3 07/01/2020 1041   ALKPHOS 142 (H) 07/01/2020 1041   AST 20 07/01/2020 1041   ALT 17 07/01/2020 1041   BILITOT 0.8 07/01/2020 1041   BILITOT 0.5 05/29/2019 1055        Lab Results  Component Value Date   WBC 9.6 07/01/2020   HGB 15.8 07/01/2020   HCT 47.0 07/01/2020   MCV 82.7 07/01/2020   PLT 332.0 07/01/2020        EOS                                                              0.8                                    07/01/2020   No results found for: DDIMER    Lab Results  Component Value Date   TSH 1.20 07/01/2020             .

## 2020-07-01 NOTE — Assessment & Plan Note (Addendum)
Target LDL  < 70 s/p cabg      Lab Results  Component Value Date   CHOL 161 07/01/2020   HDL 36.00 (L) 07/01/2020   LDLCALC 104 (H) 07/01/2020   LDLDIRECT 122.0 08/20/2014   TRIG 104.0 07/01/2020   CHOLHDL 4 07/01/2020     Not ideal but now being followed by cards, defer ? Additional rx and need more regular ex to drive up HDL/ advised         Each maintenance medication was reviewed in detail including emphasizing most importantly the difference between maintenance and prns and under what circumstances the prns are to be triggered using an action plan format where appropriate.  Total time for H and P, chart review, counseling,   and generating customized AVS unique to this office visit / charting = > 30 min

## 2020-07-01 NOTE — Assessment & Plan Note (Addendum)
Quit smoking 1971  - PFT's  09/16/2015  FEV1 1.81 (61 % ) ratio 56  p 44 % improvement from saba with DLCO  81/81c % corrects to 107  % for alv volume  Done prior to any am meds  - 06/16/2018  After extensive coaching inhaler device,  effectiveness =    90%  - 10/2018 rx prednisone as plan C x 6 days - Allergy profile 10/09/19   >  Eos 0.9 /  IgE  106 - 01/01/2020 submitted paperwork for dupixent> did not go thru > referred to Dr Neldon Mc 07/01/2020   Poor control of both rhinitis and asthma on bud/performist/singular with freq need for prednisone > short course only until see Dr [name] team and no change medications in meantime.

## 2020-07-01 NOTE — Assessment & Plan Note (Signed)
D/c'd ACEi 10/09/2019  Hoarse, pnds   Lab Results  Component Value Date   CREATININE 1.05 07/01/2020   CREATININE 1.02 10/31/2019   CREATININE 0.98 10/09/2019     Adequate control on present rx, reviewed in detail with pt > no change in rx needed

## 2020-07-01 NOTE — Patient Instructions (Addendum)
We will be referring you today to Dr [name] team  Re options for biologics for your nasal symptoms and allergies  In meantime go ahead and take the prednisone but try to hold if for at least a week before you see allergy     Please remember to go to the lab department   for your tests - we will call you with the results when they are available.       Please schedule a follow up visit in 6  months but call sooner if needed

## 2020-07-02 DIAGNOSIS — Z23 Encounter for immunization: Secondary | ICD-10-CM | POA: Diagnosis not present

## 2020-07-24 ENCOUNTER — Other Ambulatory Visit: Payer: Self-pay | Admitting: Adult Health

## 2020-08-08 DIAGNOSIS — M25552 Pain in left hip: Secondary | ICD-10-CM | POA: Diagnosis not present

## 2020-08-08 DIAGNOSIS — M1612 Unilateral primary osteoarthritis, left hip: Secondary | ICD-10-CM | POA: Diagnosis not present

## 2020-08-13 ENCOUNTER — Other Ambulatory Visit: Payer: Self-pay | Admitting: Internal Medicine

## 2020-08-13 DIAGNOSIS — I10 Essential (primary) hypertension: Secondary | ICD-10-CM

## 2020-08-13 MED ORDER — TELMISARTAN 80 MG PO TABS: 80.0000 mg | ORAL_TABLET | Freq: Every day | ORAL | 11 refills | Status: DC

## 2020-08-28 ENCOUNTER — Telehealth: Payer: Self-pay | Admitting: Internal Medicine

## 2020-08-28 NOTE — Telephone Encounter (Signed)
    Pt said, he had an open heart surgery a year and a half ago. He wanted to ask if he has a metal in his chest used to close him up. He said he needs an MRI at his ortho doctor and he wanted to make sure he doesn't have any metal in his body.

## 2020-08-28 NOTE — Telephone Encounter (Signed)
Would advise the patient contact radiology about this.  Thanks,  Dr. Lemmie Evens

## 2020-08-29 NOTE — Telephone Encounter (Signed)
Referred patient back to Dr. Orvan Seen with [facility] to answer questions regarding sternal plate and MRI safety. [phone].

## 2020-09-03 DIAGNOSIS — M25552 Pain in left hip: Secondary | ICD-10-CM | POA: Diagnosis not present

## 2020-09-19 DIAGNOSIS — M1612 Unilateral primary osteoarthritis, left hip: Secondary | ICD-10-CM | POA: Diagnosis not present

## 2020-10-02 DIAGNOSIS — M25552 Pain in left hip: Secondary | ICD-10-CM | POA: Diagnosis not present

## 2020-10-28 ENCOUNTER — Other Ambulatory Visit: Payer: Self-pay | Admitting: Internal Medicine

## 2020-10-28 DIAGNOSIS — I10 Essential (primary) hypertension: Secondary | ICD-10-CM

## 2020-10-28 MED ORDER — TELMISARTAN 80 MG PO TABS: 80.0000 mg | ORAL_TABLET | Freq: Every day | ORAL | 1 refills | Status: DC

## 2020-10-30 ENCOUNTER — Telehealth: Payer: Self-pay | Admitting: Internal Medicine

## 2020-10-30 NOTE — Telephone Encounter (Signed)
ATC patient, LMTCB 

## 2020-10-31 MED ORDER — METOPROLOL TARTRATE 50 MG PO TABS: ORAL_TABLET | ORAL | 1 refills | Status: DC

## 2020-10-31 NOTE — Telephone Encounter (Signed)
Called and spoke to pt. Pt is requesting a refill of Metoprolol. Rx sent to preferred pharmacy. Pt verbalized understanding and denied any further questions or concerns at this time.

## 2020-11-01 IMAGING — DX CHEST - 2 VIEW
2 series · 2 of 2 positions shown · non-contrast
Comparison: 01/22/2017

CLINICAL DATA: Essential hypertension

EXAM:
CHEST - 2 VIEW

[chest pa]
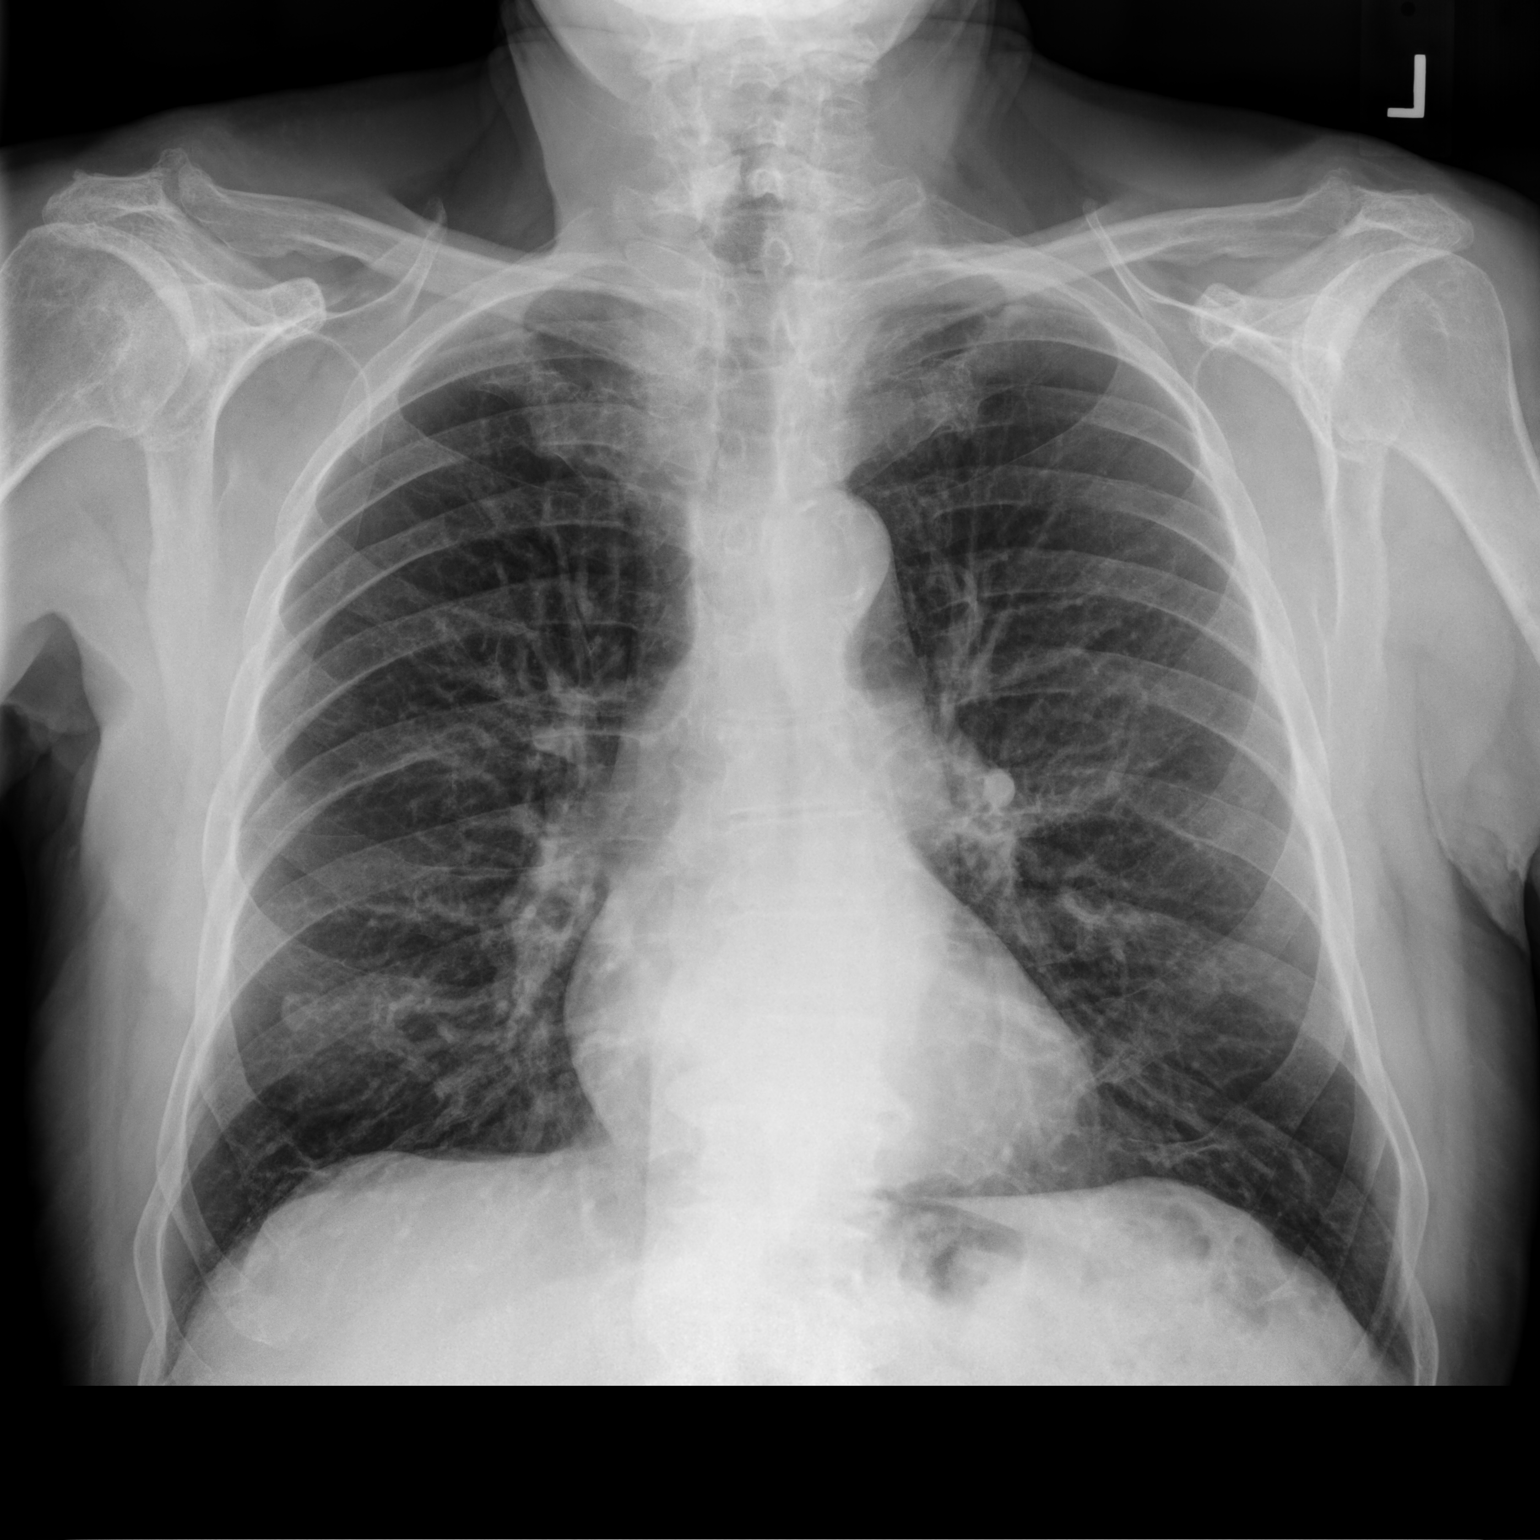

[chest lat]
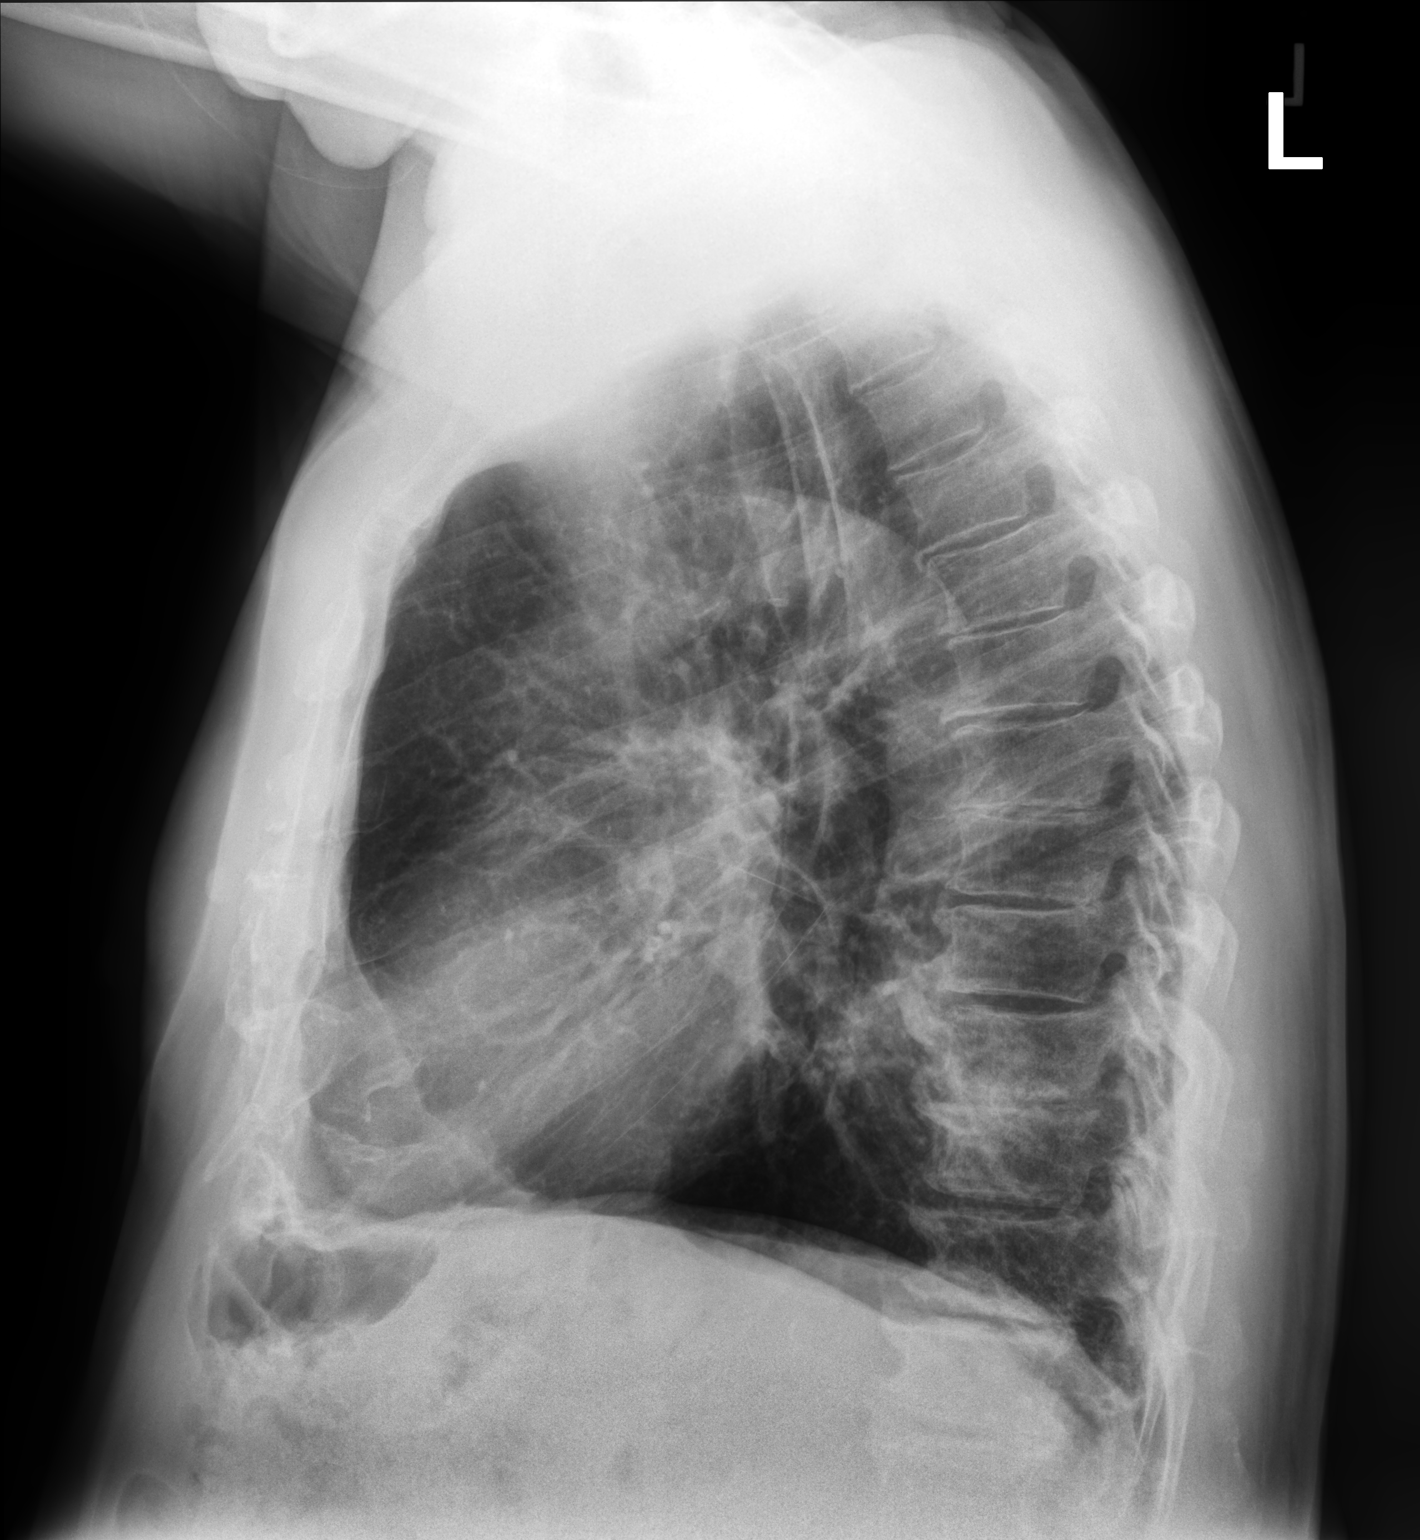

[2 of 2 positions shown; findings below may reference images not displayed]

FINDINGS: Heart and mediastinal contours are within normal limits. No focal
opacities or effusions. No acute bony abnormality.
IMPRESSION: No active cardiopulmonary disease.

## 2020-11-10 ENCOUNTER — Other Ambulatory Visit: Payer: Self-pay | Admitting: Internal Medicine

## 2020-11-11 NOTE — Telephone Encounter (Signed)
Rx has been sent to the pharmacy electronically. ° °

## 2020-11-27 ENCOUNTER — Telehealth: Payer: Self-pay | Admitting: Internal Medicine

## 2020-11-27 MED ORDER — PREDNISONE 10 MG PO TABS: ORAL_TABLET | ORAL | 6 refills | Status: AC

## 2020-11-27 MED ORDER — MONTELUKAST SODIUM 10 MG PO TABS: 10.0000 mg | ORAL_TABLET | Freq: Every day | ORAL | 4 refills | Status: DC

## 2020-11-27 NOTE — Telephone Encounter (Signed)
Called and spoke with Patient. Patient requested a refill of Singulair, 90 days, and refill for Prednisone. Patient stated Dr. Melvyn Novas prescribed Prednisone to keep and take as needed. Patient stated it was normally Prednisone 10mg   #14, with refills. Singulair refill sent to requested pharmacy. Message routed to Dr. Melvyn Novas to advise on Prednisone amount and refills.  07/01/20-Dr Sharyon Medicus-  Instructions  We will be referring you today to Dr [name] team  Re options for biologics for your nasal symptoms and allergies  In meantime go ahead and take the prednisone but try to hold if for at least a week before you see allergy     Please remember to go to the lab department   for your tests - we will call you with the results when they are available.       Please schedule a follow up visit in 6  months but call sooner if needed

## 2020-11-27 NOTE — Telephone Encounter (Signed)
This is for a refill, not a new rx, so it should say the same as before or go by last avs rec where it's mentioned

## 2020-11-27 NOTE — Telephone Encounter (Signed)
Called and spoke with patient.  Advised that script for Prednisone had been sent to his pharmacy 14 tablets with 6 refills per Dr. Gustavus Bryant last OV notes:  Prednisone 10 mg, 4 tablets with breakfast for 2 days, 2 tablets with breakfast for 2 days, 1 tablet with breakfast for 2 days and then stop.  He stated that is what is usually sent in for him.  Verified pharmacy and script sent.  Advised that Singular had already been sent to his pharmacy.  He verbalized understanding.  Nothing further needed.

## 2020-11-27 NOTE — Telephone Encounter (Signed)
Dr. Melvyn Novas, please advise on the directions of the prednisone. Thanks.

## 2020-11-27 NOTE — Telephone Encounter (Signed)
Refill singulair x one year and prednsione x 6

## 2020-12-29 ENCOUNTER — Other Ambulatory Visit: Payer: Self-pay | Admitting: Internal Medicine

## 2020-12-30 ENCOUNTER — Other Ambulatory Visit: Payer: Self-pay

## 2020-12-30 ENCOUNTER — Ambulatory Visit (INDEPENDENT_AMBULATORY_CARE_PROVIDER_SITE_OTHER): Payer: Medicare Other | Admitting: Internal Medicine

## 2020-12-30 DIAGNOSIS — J339 Nasal polyp, unspecified: Secondary | ICD-10-CM | POA: Diagnosis not present

## 2020-12-30 DIAGNOSIS — I10 Essential (primary) hypertension: Secondary | ICD-10-CM

## 2020-12-30 DIAGNOSIS — E785 Hyperlipidemia, unspecified: Secondary | ICD-10-CM | POA: Diagnosis not present

## 2020-12-30 DIAGNOSIS — J45909 Unspecified asthma, uncomplicated: Secondary | ICD-10-CM

## 2020-12-30 DIAGNOSIS — Z886 Allergy status to analgesic agent status: Secondary | ICD-10-CM

## 2020-12-30 LAB — LIPID PANEL
Cholesterol: 155 mg/dL (ref 0–200)
HDL: 42.7 mg/dL (ref >39.00)
LDL Cholesterol: 94 mg/dL (ref 0–99)
NonHDL: 112.46
Total CHOL/HDL Ratio: 4
Triglycerides: 94 mg/dL (ref 0.0–149.0)
VLDL: 18.8 mg/dL (ref 0.0–40.0)

## 2020-12-30 LAB — HEPATIC FUNCTION PANEL
ALT: 19 U/L (ref 0–53)
AST: 24 U/L (ref 0–37)
Albumin: 4 g/dL (ref 3.5–5.2)
Alkaline Phosphatase: 116 U/L (ref 39–117)
Bilirubin, Direct: 0.2 mg/dL (ref 0.0–0.3)
Total Bilirubin: 0.9 mg/dL (ref 0.2–1.2)
Total Protein: 6.3 g/dL (ref 6.0–8.3)

## 2020-12-30 LAB — BASIC METABOLIC PANEL
BUN: 18 mg/dL (ref 6–23)
CO2: 32 mEq/L (ref 19–32)
Calcium: 9.1 mg/dL (ref 8.4–10.5)
Chloride: 104 mEq/L (ref 96–112)
Creatinine, Ser: 0.99 mg/dL (ref 0.40–1.50)
GFR: 73.54 mL/min (ref >60.00)
Glucose, Bld: 92 mg/dL (ref 70–99)
Potassium: 4.5 mEq/L (ref 3.5–5.1)
Sodium: 141 mEq/L (ref 135–145)

## 2020-12-30 MED ORDER — FORMOTEROL FUMARATE 20 MCG/2ML IN NEBU: INHALATION_SOLUTION | RESPIRATORY_TRACT | 11 refills | Status: DC

## 2020-12-30 MED ORDER — BUDESONIDE 0.25 MG/2ML IN SUSP: RESPIRATORY_TRACT | 11 refills | Status: DC

## 2020-12-30 NOTE — Patient Instructions (Signed)
We will be referring you to Dr [name] service with ? Need for dupixent or alternative for your nasal symptoms   Please remember to go to the lab department   for your tests - we will call you with the results when they are available.      Please schedule a follow up office visit in 6 months call sooner if needed

## 2020-12-30 NOTE — Progress Notes (Signed)
Subjective:    Patient ID: Antonio Carlson, male    DOB: 01-20-1946   MRN: [id number]  Brief patient profile:  75  yowm quit smoking in 1971 with history of [facility] Ast[facility] and hyperlipidemia and hbp    Brief patient profile:  [birth year]/28/2017 NP  Follow up : HTN, Hyperlipidemia/[facility] Asthma  Pt returns for 3 month follow up and med review .  We reviewed all his meds and organized them into a med calendar w/ pt education . He appears to be taking correctly except for flonase , has not been using on regular basis.  Complains of 1 week of nasal congestion , drainage, stuffiness, sneezing, and ear fullness. No fever or sinus pain. Minimal cough .   rec Prednisone taper as directed.  Follow med calendar closely and bring to each visit.  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  1st of May 2020/ hbp/ hyperlipidemia   Cc new chest burning with exertion new since Jan 2020   Dyspnea:  Not limited by breathing from desired activities  But slows down more than used to - avoid fast x 5 m is limit = MMRC1 = can walk nl pace, flat grade, can't hurry or go uphills or steps s sob   Cough: none  Sleeping: able to lie flat / on pillow SABA use: rarely rec [facility] will contact you for cardiology referral for new exercise related chest discomfort  Please remember to go to the lab and x-ray department   for your tests - we will call you with the results when they are available.   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 Follow-up    Patient reports that his breathing and cough are doing well at this time.  Dyspnea: no change with  occ cp with exertion > cards eval in progress , no change pattern  Cough: none now  Sleeping: able to lie flat/ one pillow SABA use:  none 02: none  rec No change rx    10/09/2019  f/u ov/Antonio Carlson re: triad asthma/ last pred Dec 2020 for back not breathing  Chief Complaint  Patient presents with  . Follow-up    Breathing is doing well and he has not been using his albuterol. He has occ cough that he related to sinus drainage. Also c/o nasal congestion.   Dyspnea:  Nl pace flat surface fine = MMRC1 = can walk nl pace, flat grade, can't hurry or go uphills or steps s sob   -walking up to 30 min daily  Cough: assoc with nasal congestion/some slt discolored mucus  Sleeping: able to lie flat/one pillow  SABA use: very rarely  02: none  rec Plan A = Automatic = Always=    Budesonide/ Performist twice daily per nebulizer  Plan B = Backup (to supplement plan A, not to replace it) Only use your albuterol(yellow is proventil)  inhaler Plan C = Crisis - if Plan B stops working or you need more of it than usual - Prednisone 10 mg take  4 each am x 2 days,   2 each am x 2 days,  1 each am x 2 days and stop  Stop zestoril = lisinopril  Start micardis (  telmasartan) 80 mg one half daily if blood pressure not at goal take a whole pill daily  See [name] w/in 3 months with all your medications,   Move up [name] f/u to 2 weeks to recheck K and add hctz if elevated (eg micardis 40-12.5)    01/01/2020  f/u ov/Antonio Carlson re: triad asthma - using pred approx 5 cycles  x a year, last done one week prior to [place]  Patient presents with  . Follow-up    2 month f/u for asthma. States his breathing has been great since last visit. Denies any current issues.   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 Follow-up    sinus drainage making him cough   Dyspnea:  Not limited by breathing from desired activities  - yardwork / hunting  Cough: worse x 1 weeks / min clear Sleeping: bothered by nasal congestion/ drainage /  SABA use: none but prednisone 1st of nov 2021 and before that in sept  02: none   rec We will be referring you today to Dr [name] team  Re options for biologics for your nasal symptoms and allergies In meantime go ahead and take the prednisone but try to hold if for at least a week before you see allergy Please remember to go to the lab department   for your tests - we will call you with the results when they are available. Please schedule a follow up visit in 6  months but call sooner if needed    12/30/2020  f/u ov/Antonio Carlson re: Triad asthma maint on perforomist / budesonide no prednisone x one month triggered nasal congestion Chief Complaint  Patient presents with  . Follow-up    Pt c/o cough x 2 wks- prod with thick, clear sputum. He rarely uses his albuterol. He states today his "head feels stopped up".    Dyspnea:  Not limited by breathing from desired activities  / some L hip problems  Cough: assoc with pnds worse again  Sleeping: ok flat/ has electric bed  SABA use: rarely  02: none  Covid status:   X 3    No obvious day to day or daytime variability or assoc excess/ purulent sputum or mucus plugs or hemoptysis or cp or chest tightness, subjective wheeze or overt   hb symptoms.   Sleeping  without nocturnal  or early am exacerbation  of respiratory  c/o's or need for noct saba. Also denies any obvious fluctuation of symptoms with weather or environmental changes or other aggravating or alleviating factors except as outlined above   No unusual exposure hx or h/o childhood pna/ asthma or knowledge of premature birth.  Current Allergies, Complete Past Medical History, Past Surgical History, Family  History, and Social History were reviewed in [employer] record.  ROS  The following are not active complaints unless bolded Hoarseness, sore throat, dysphagia, dental problems, itching, sneezing,  nasal congestion or discharge of excess mucus or purulent secretions, ear  ache,   fever, chills, sweats, unintended wt loss or wt gain, classically pleuritic or exertional cp,  orthopnea pnd or arm/hand swelling  or leg swelling, presyncope, palpitations, abdominal pain, anorexia, nausea, vomiting, diarrhea  or change in bowel habits or change in bladder habits, change in stools or change in urine, dysuria, hematuria,  rash, arthralgias, visual complaints, headache, numbness, weakness or ataxia or problems with walking or coordination,  change in mood or  memory.        Current Meds  Medication Sig  . albuterol (VENTOLIN HFA) 108 (90 Base) MCG/ACT inhaler Inhale 2 puffs into the lungs every 6 (six) hours as needed for wheezing or shortness of breath.  [name] Kitchen atorvastatin (LIPITOR) 40 MG tablet TAKE 1 TABLET BY MOUTH EVERY DAY AT 6PM  . budesonide (PULMICORT) 0.25 MG/2ML nebulizer solution USE 1 VIAL IN NEBULIZER TWICE DAILY  . calcium citrate-vitamin D 500-400 MG-UNIT chewable tablet Chew 1 tablet by mouth daily.  . clopidogrel (PLAVIX) 75 MG tablet Take 1 tablet by mouth once daily  . diclofenac Sodium (VOLTAREN) 1 % GEL Apply 2 g topically 4 (four) times daily.  . formoterol (PERFOROMIST) 20 MCG/2ML nebulizer solution USE 2 ML IN NEBULIZER  TWICE DAILY  . metoprolol tartrate (LOPRESSOR) 50 MG tablet Take 1/2 tablet by mouth twice daily  . montelukast (SINGULAIR) 10 MG tablet Take 1 tablet (10 mg total) by mouth at bedtime.  . Multiple Vitamins-Minerals (CENTRUM) tablet Take 1 tablet by mouth daily.  [name] Kitchen omeprazole (PRILOSEC) 20 MG capsule Take 1 capsule by mouth once daily  . telmisartan (MICARDIS) 80 MG tablet Take 1 tablet (80 mg total) by mouth daily.            PMH PVCs.   Syncope  - Holter ordered June 27, 2008 > nl  - EP consult June 27, 2008  OSTEOPENIA (ICD-733.90)  COLONIC POLYPS (ICD-211.3) .......................................[name] Kitchen [name] - see colonoscopy 11/07/10 (repeat in 10 yr)  DIVERTICULOSIS, MILD (ICD-562.10) ASTHMA (ICD-493.90)  - HFA 90% June 11, 2009  ALLERGIC RHINITIS, CHRONIC (ICD-477.9)  - steroid dep until mid oct 2009  - Allergy profile sent January 23, 2010 >> IgE 18.3  - Polypectomy Sept 2012 ......................................................  [name]/ [facility]  BENIGN PROSTATIC HYPERTROPHY, HX OF (ICD-V13.8) .[name] Kitchen... 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  [place]........................................................ Dominie Benedick  R C6/7 radiculopathy 2015 .....................................................[name] KitchenTrenton Gammon  - CPX 07/01/2020  - Pneumovax 11/2004 and @ age 10 June 11, 2009 and Prevnar 13 08/14/2013  - Td 07/2005   And  08/01/2015  - 2nd Covid May 2021        Past Surgical History:  Appendectomy  Colonoscopy     Family History:  heart disease in his father onset at age 38 he was a smoker  Ca brain half brother  95 siblings  - one lung ca, one breast ca, one brain ca Mother dementia onset late 45s lived to be 35    Social History:  quit smoking 1971  rarely drink alcohol  Retired  [employer] 2018  - remarried sept 2020        Objective:   Physical Exam  12/30/2020     172 07/01/2020   177 01/01/2020      175  10/09/2019    170   03/16/2019   179  wt 198 May 17, 2008 >  > 192 07/17/11 >  02/05/2012  191> 05/10/2012  188>  08/10/2012 190> 11/14/2012 180>182 12/12/2012 > 03/14/2013 182 > 07/06/2013 185 > 08/14/2013  184 > 01/31/2014  176 >176 .03/22/2014 >  05/31/2014   181 > 06/11/14  179 > 08/20/2014   184> 02/18/2015  181 >  08/01/2015  178 > 09/16/2015 182  > 12/16/2015   185 > 03/17/2016  185> 08/07/2016   187  > 10/22/2016    183 > 01/22/2017   180> 07/29/2017   177 > 10/28/2017  169 >  06/16/2018  175 > 12/15/2018  182 >    Vital signs reviewed  12/30/2020  - Note at rest 02 sats  97% on RA   General appearance:    Robust elderly wm/ nasal tone to voice   HEENT : pt wearing mask not removed for exam due to covid -19 concerns.    NECK :  without JVD/Nodes/TM/ nl carotid upstrokes bilaterally   LUNGS: no acc muscle use,  Nl contour chest with faint mid exp wheezes bilaterally without cough on insp or exp maneuvers   CV:  RRR  no s3 or murmur or increase in P2, and no edema   ABD:  soft and nontender with nl inspiratory excursion in the supine position. No bruits or organomegaly appreciated, bowel sounds nl  MS:  Nl gait/ ext warm without deformities, calf tenderness, cyanosis or clubbing No obvious joint restrictions   SKIN: warm and dry without lesions    NEURO:  alert, approp, nl sensorium with  no motor or cerebellar deficits apparent.           Labs ordered/ reviewed:      Chemistry      Component Value Date/Time   NA 141 12/30/2020 1034   NA 143 05/29/2019 1055   K 4.5 12/30/2020 1034   CL 104 12/30/2020 1034   CO2 32 12/30/2020 1034   BUN 18 12/30/2020 1034   BUN 17 05/29/2019 1055   CREATININE 0.99 12/30/2020 1034      Component Value Date/Time   CALCIUM 9.1 12/30/2020 1034   ALKPHOS 116 12/30/2020 1034   AST 24 12/30/2020 1034   ALT 19 12/30/2020 1034   BILITOT 0.9 12/30/2020 1034   BILITOT 0.5 05/29/2019 1055      Lab Results  Component Value Date   CHOL 155 12/30/2020   HDL 42.70 12/30/2020   LDLCALC 94 12/30/2020   LDLDIRECT 122.0 08/20/2014   TRIG 94.0 12/30/2020   CHOLHDL 4 12/30/2020      .

## 2020-12-31 ENCOUNTER — Encounter: Payer: Self-pay | Admitting: Internal Medicine

## 2020-12-31 NOTE — Assessment & Plan Note (Signed)
Quit smoking 1971  - PFT's  09/16/2015  FEV1 1.81 (61 % ) ratio 56  p 44 % improvement from saba with DLCO  81/81c % corrects to 107  % for alv volume  Done prior to any am meds  - 06/16/2018  After extensive coaching inhaler device,  effectiveness =    90%  - 10/2018 rx prednisone as plan C x 6 days - Allergy profile 10/09/19   >  Eos 0.9 /  IgE  106 - 01/01/2020 submitted paperwork for dupixent> did not go thru > referred to Dr Neldon Mc 07/01/2020  - 12/30/2020 referred again to Dr [name] service   Due to freq need for predniosne to control rhinitis/asthma rec consider biologic, no necessarily dupixent though leave this to Dr Raliegh Ip

## 2020-12-31 NOTE — Assessment & Plan Note (Signed)
Target LDL  < 70 pcabg  Adequate control on present rx, reviewed in detail with pt > no change in rx needed           Each maintenance medication was reviewed in detail including emphasizing most importantly the difference between maintenance and prns and under what circumstances the prns are to be triggered using an action plan format where appropriate.  Total time for H and P, chart review, counseling  and generating customized AVS unique to this office visit / same day charting = 20 min

## 2020-12-31 NOTE — Assessment & Plan Note (Signed)
D/c'd ACEi 10/09/2019  Hoarse, pnds   Lab Results  Component Value Date   CREATININE 0.99 12/30/2020   CREATININE 1.05 07/01/2020   CREATININE 1.02 10/31/2019     Adequate control on present rx, reviewed in detail with pt > no change in rx needed

## 2021-01-01 ENCOUNTER — Encounter: Payer: Self-pay | Admitting: *Deleted

## 2021-01-05 ENCOUNTER — Other Ambulatory Visit: Payer: Self-pay | Admitting: Internal Medicine

## 2021-01-22 DIAGNOSIS — M25552 Pain in left hip: Secondary | ICD-10-CM | POA: Diagnosis not present

## 2021-01-31 ENCOUNTER — Telehealth: Payer: Self-pay | Admitting: Internal Medicine

## 2021-01-31 NOTE — Telephone Encounter (Signed)
Received a fax from [facility] stating that the patient's insurance will not cover [facility] will cover Brovana.   Dr. Melvyn Novas, are you ok with him switching to [facility]?

## 2021-02-01 NOTE — Telephone Encounter (Signed)
Ok to change to brovana

## 2021-02-03 MED ORDER — ARFORMOTEROL TARTRATE 15 MCG/2ML IN NEBU: 15.0000 ug | INHALATION_SOLUTION | Freq: Two times a day (BID) | RESPIRATORY_TRACT | 6 refills | Status: DC

## 2021-02-03 NOTE — Telephone Encounter (Signed)
Called patient but he was not available so I spoke with his wife. She verbalized understanding about switching from Perforomist to [place]. She will pass the information on to the patient. Will leave this encounter open in case he calls back with questions.   Antonio Carlson has been sent to the pharmacy.

## 2021-02-04 NOTE — Telephone Encounter (Signed)
Returned call to Patient. Patient's Wife answered call and stated Patient was not home at this time. She requested someone call back around 5 pm, because Patient should be home at that time.

## 2021-02-04 NOTE — Telephone Encounter (Signed)
ATC patient and the wife said the patient had just left. She asked me to call him on his cell phone and stated that is the best number to get ahold of the patient. Called and spoke with patient who wanted to know why his medication was changed. Informed him that note from [facility] stated his insurcane was no longer going to cover Perforomist and that [facility] Hatchet was covered and Dr. Melvyn Novas was ok with the switch. Patient stated that he pick up RX last week and it was covered then so he did not understand what the issue was. Told him that he will have to call pharmacy or insurance and ask them what they are willing to cover and to call back and let us know because we are just going off the fax that we received. Patient expressed understanding and said he would call us back. Will wait to hear from patient.

## 2021-02-05 ENCOUNTER — Telehealth: Payer: Self-pay | Admitting: *Deleted

## 2021-02-07 NOTE — Telephone Encounter (Addendum)
Spoke to patient for update.  Patient stated that he spoke to [employer] and insurance. It appears that [facility] filed Rx incorrectly.   He picked up Perforomist and pulmicort last week. He requested that [facility] cancel Rx for brovana.  Patient will call back next month if he has any issues with Perforomist. Nothing further needed at this time.

## 2021-02-07 NOTE — Addendum Note (Signed)
Addended by: Claudette Head A on: 02/07/2021 09:44 AM   Modules accepted: Orders

## 2021-02-20 DIAGNOSIS — M25552 Pain in left hip: Secondary | ICD-10-CM | POA: Diagnosis not present

## 2021-03-12 ENCOUNTER — Other Ambulatory Visit: Payer: Self-pay

## 2021-03-12 ENCOUNTER — Ambulatory Visit (INDEPENDENT_AMBULATORY_CARE_PROVIDER_SITE_OTHER): Payer: Medicare Other | Admitting: Internal Medicine

## 2021-03-12 VITALS — BP 130/78 | HR 65 | Ht 68.0 in | Wt 179.2 lb

## 2021-03-12 DIAGNOSIS — Z79899 Other long term (current) drug therapy: Secondary | ICD-10-CM

## 2021-03-12 DIAGNOSIS — I480 Paroxysmal atrial fibrillation: Secondary | ICD-10-CM

## 2021-03-12 DIAGNOSIS — I2510 Atherosclerotic heart disease of native coronary artery without angina pectoris: Secondary | ICD-10-CM

## 2021-03-12 DIAGNOSIS — E785 Hyperlipidemia, unspecified: Secondary | ICD-10-CM | POA: Diagnosis not present

## 2021-03-12 DIAGNOSIS — Z951 Presence of aortocoronary bypass graft: Secondary | ICD-10-CM | POA: Diagnosis not present

## 2021-03-12 MED ORDER — ATORVASTATIN CALCIUM 40 MG PO TABS: ORAL_TABLET | ORAL | 3 refills | Status: DC

## 2021-03-12 MED ORDER — EZETIMIBE 10 MG PO TABS: 10.0000 mg | ORAL_TABLET | Freq: Every day | ORAL | 3 refills | Status: DC

## 2021-03-12 NOTE — Progress Notes (Signed)
OFFICE CONSULT NOTE  Chief Complaint:  Follow-up  Primary Care Physician: [name], MD  HPI:  Antonio Carlson is a 75 y.o. male who is being seen today for the evaluation of chest pain at the request of [name], MD. This is a pleasant 75 year old male patient of Dr. Melvyn Novas he is been followed for asthma for some time.  He has a history of hypertension, dyslipidemia and PVCs in the past.  Family history significant for his father who had an MI in his 20s.  He was a previously a smoker but quit in the 1970s.  Since about January has had some exertional burning in his chest.  Is worse with exercise and improves at rest.  It does not radiate.  His symptoms have been fairly stable.  He says it does not always happen with exertion.  It was felt that some of the symptoms were out of proportion for his lung disease.  His dyslipidemia is well managed with total cholesterol 181, triglycerides 138, HDL 51 and LDL 102 on 40 mg simvastatin.  He is not on aspirin due to anaphylactic reaction.  He also has hypertension and is on a beta-blocker.  Heart rate is in the low 60s.  Blood pressures well controlled today.  EKG was performed the office shows sinus rhythm with sinus arrhythmia at 61.  Antonio Carlson is an avid hunter but also notes that when he walks a certain distance he gets pain in both of the buttocks and upper thighs and has to stop.  Once he stops the symptoms get better and then he gets up and goes on again.  He has had x-rays of his hips which showed some mild osteoarthritis but no significant findings to explain his pain.  06/27/2019  Antonio Carlson returns today for follow-up of coronary artery bypass grafting which was in early October.  He had four-vessel CABG with LIMA to LAD, RIMA to PDA, right radial to distal obtuse marginal and ramus intermedius on May 01, 2019.  He did very well except for postoperative A. fib with RVR requiring amiodarone and spontaneously converted to sinus.  He  was placed on Eliquis and amiodarone however he could not afford the Eliquis and discontinued it fairly quickly.  He was seen in follow-up and has since been seen by Dr. Orvan Seen who operated on him.  His anticoagulation was discontinued but he remains on amiodarone.  He was cleared to start driving.  According to Antonio Carlson he is doing fairly well except that he has been having some cough and left sided lower chest discomfort in the back.  Is worse sometimes with change in position or taking deep breaths.  This was brought up but not felt to be significant.  He plans to go to [place] where he stays for typically 3 months.  He would like to do cardiac rehabilitation at hospital there that is about 15 minutes from his house.  He denies any recurrent palpitations or A. fib.  EKG today shows sinus rhythm.  He did have recent labs show total cholesterol 153, triglycerides 142, HDL 45 and LDL of 83.  Previously he was on simvastatin but changed to atorvastatin 40 mg daily.  02/12/2020  Antonio Carlson is seen today in follow-up.  He continues to do well now a ways out from his CABG.  Blood pressure is well controlled.  He has been followed by pulmonary and has had some improvement in his breathing.  His pleural effusions  have resolved.  Cholesterol seems to be reasonably well controlled however LDL was still 102.  This was as of May 2020.  His target LDL is less than 70.  EKG shows sinus rhythm at 65.  03/12/2021  Antonio Carlson returns for follow-up.  Overall he is doing well.  Blood pressure was initially elevated however came down to 130/78.  Weight is up a little bit.  He said he struggling with an issue on his hand.  He denies any worsening shortness of breath.  His cholesterol is little higher than target with a total 155, HDL 42, triglycerides 94 and LDL 94 in June 2022.  He is on high-dose atorvastatin which she says he is compliant with.  PMHx:  Past Medical History:  Diagnosis Date   Allergy    Anginal pain  ([place])    Arthritis    Asthma    Benign prostatic hypertrophy    Coronary artery disease    Diverticulosis    Dyspnea    GERD (gastroesophageal reflux disease)    History of kidney stones    Hyperlipidemia    Hypertension    Nephrolithiasis    PVC's (premature ventricular contractions)    Syncope     Past Surgical History:  Procedure Laterality Date   APPENDECTOMY     CORONARY ARTERY BYPASS GRAFT N/A 05/01/2019   Procedure: CORONARY ARTERY BYPASS GRAFTING (CABG) TIMES FOUR USING LEFT AND RIGHT INTERNAL MAMMARY ARTERIES AND RIGHT RADIAL ARTERY WITH STERNAL PLATING;  Surgeon: [name], MD;  Location: [facility];  Service: Open Heart Surgery;  Laterality: N/A;   LEFT HEART CATH AND CORONARY ANGIOGRAPHY N/A 04/18/2019   Procedure: LEFT HEART CATH AND CORONARY ANGIOGRAPHY;  Surgeon: Belva Crome, MD;  Location: [facility];  Service: Cardiovascular;  Laterality: N/A;   NASAL SINUS SURGERY  Sept 2012   RADIAL ARTERY HARVEST Right 05/01/2019   Procedure: Right Radial Artery Harvest;  Surgeon: [name], MD;  Location: [facility];  Service: Open Heart Surgery;  Laterality: Right;   TEE WITHOUT CARDIOVERSION N/A 05/01/2019   Procedure: TRANSESOPHAGEAL ECHOCARDIOGRAM (TEE);  Surgeon: [name], MD;  Location: [facility];  Service: Open Heart Surgery;  Laterality: N/A;    FAMHx:  Family History  Problem Relation Age of Onset   Heart attack Father     SOCHx:   reports that he quit smoking about 51 years ago. He has a 28.00 pack-year smoking history. He has never used smokeless tobacco. He reports current alcohol use. He reports that he does not use drugs.  ALLERGIES:  Allergies  Allergen Reactions   Aspirin Shortness Of Breath   Amoxicillin     GI upset Did it involve swelling of the face/tongue/throat, SOB, or low BP? No Did it involve sudden or severe rash/hives, skin peeling, or any reaction on the inside of your mouth or nose? No Did you need to seek medical  attention at a hospital or doctor's office? No When did it last happen?    20+ years   If all above answers are "NO", may proceed with cephalosporin use.    Sulfonamide Derivatives Other (See Comments)    Doesn't remember    ROS: Pertinent items noted in HPI and remainder of comprehensive ROS otherwise negative.  HOME MEDS: Current Outpatient Medications on File Prior to Visit  Medication Sig Dispense Refill   albuterol (VENTOLIN HFA) 108 (90 Base) MCG/ACT inhaler Inhale 2 puffs into the lungs every 6 (six) hours as  needed for wheezing or shortness of breath.     atorvastatin (LIPITOR) 40 MG tablet TAKE 1 TABLET BY MOUTH EVERY DAY AT 6PM 90 tablet 0   budesonide (PULMICORT) 0.25 MG/2ML nebulizer solution USE 1 VIAL IN NEBULIZER TWICE DAILY 120 mL 11   calcium citrate-vitamin D 500-400 MG-UNIT chewable tablet Chew 1 tablet by mouth daily.     clopidogrel (PLAVIX) 75 MG tablet Take 1 tablet by mouth once daily 90 tablet 3   formoterol (PERFOROMIST) 20 MCG/2ML nebulizer solution Take 20 mcg by nebulization 2 (two) times daily.     metoprolol tartrate (LOPRESSOR) 50 MG tablet Take 1/2 tablet by mouth twice daily 90 tablet 1   montelukast (SINGULAIR) 10 MG tablet Take 1 tablet (10 mg total) by mouth at bedtime. 90 tablet 4   omeprazole (PRILOSEC) 20 MG capsule Take 1 capsule by mouth once daily 90 capsule 3   predniSONE (DELTASONE) 10 MG tablet Take by mouth.     telmisartan (MICARDIS) 80 MG tablet Take 1 tablet (80 mg total) by mouth daily. 90 tablet 1   diclofenac Sodium (VOLTAREN) 1 % GEL Apply 2 g topically 4 (four) times daily. (Patient not taking: Reported on 03/12/2021) 150 g 0   Multiple Vitamins-Minerals (CENTRUM) tablet Take 1 tablet by mouth daily. (Patient not taking: Reported on 03/12/2021)     [DISCONTINUED] rosuvastatin (CRESTOR) 20 MG tablet Take 1 tablet (20 mg total) by mouth at bedtime. 30 tablet 1   No current facility-administered medications on file prior to visit.     LABS/IMAGING: No results found for this or any previous visit (from the past 48 hour(s)). No results found.  LIPID PANEL:    Component Value Date/Time   CHOL 155 12/30/2020 1034   CHOL 153 05/29/2019 1055   TRIG 94.0 12/30/2020 1034   HDL 42.70 12/30/2020 1034   HDL 45 05/29/2019 1055   CHOLHDL 4 12/30/2020 1034   VLDL 18.8 12/30/2020 1034   LDLCALC 94 12/30/2020 1034   LDLCALC 83 05/29/2019 1055   LDLDIRECT 122.0 08/20/2014 0939    WEIGHTS: Wt Readings from Last 3 Encounters:  03/12/21 179 lb 3.2 oz (81.3 kg)  12/30/20 172 lb 12.8 oz (78.4 kg)  07/01/20 177 lb 6.4 oz (80.5 kg)    VITALS: BP (!) 159/82   Pulse 65   Ht '5\' 8"'$  (1.727 m)   Wt 179 lb 3.2 oz (81.3 kg)   BMI 27.25 kg/m   EXAM: General appearance: alert and no distress Neck: no carotid bruit, no JVD and thyroid not enlarged, symmetric, no tenderness/mass/nodules Lungs: diminished breath sounds LLL Heart: regular rate and rhythm, S1, S2 normal, no murmur, click, rub or gallop Abdomen: soft, non-tender; bowel sounds normal; no masses,  no organomegaly Extremities: extremities normal, atraumatic, no cyanosis or edema and varicose veins noted Pulses: normal LE pulses Skin: Skin color, texture, turgor normal. No rashes or lesions Neurologic: Grossly normal Psych: Pleasant  EKG: Normal sinus rhythm with sinus arrhythmia at 65 - personally reviewed  ASSESSMENT: Coronary artery disease status post four-vessel CABG (05/01/2019-LIMA to LAD, RIMA to PDA, right radial to distal obtuse marginal, and radial Ms. intermedius of the left circumflex) Dr. Orvan Seen Asthma Hypertension Dyslipidemia Bilateral hip pain Asymptomatic bilateral varicose veins PAF - on Eliquis  PLAN: 1.   Antonio Carlson denies any chest pain or worsening shortness of breath.  His cholesterol still little elevated.  I advise adding ezetimibe 10 mg daily to his regimen and continuing atorvastatin.  Plan repeat lipid  in 3 months.  Blood pressure  is well controlled.  He denies any recurrent atrial fibrillation.  Plan otherwise follow-up with me in 1 year or sooner as necessary  Antonio Casino, MD, [facility], [place] Director of the [facility]  [facility] Diplomate of the American Board of Clinical Lipidology Attending Cardiologist  Direct Dial: [phone]  Fax: [phone]  Website:  [email or web address] 03/12/2021, 11:41 AM

## 2021-03-12 NOTE — Telephone Encounter (Signed)
Rx(s) sent to pharmacy electronically.  

## 2021-03-12 NOTE — Patient Instructions (Signed)
Medication Instructions:  Start Zetia 10 mg daily  *If you need a refill on your cardiac medications before your next appointment, please call your pharmacy*   Lab Work: Your physician recommends that you return for lab work in 3 months (fasting lipid).    Testing/Procedures: None ordered today   Follow-Up: At [facility], you and your health needs are our priority.  As part of our continuing mission to provide you with exceptional heart care, we have created designated Provider Care Teams.  These Care Teams include your primary Cardiologist (physician) and Advanced Practice Providers (APPs -  Physician Assistants and Nurse Practitioners) who all work together to provide you with the care you need, when you need it.  We recommend signing up for the patient portal called "MyChart".  Sign up information is provided on this After Visit Summary.  MyChart is used to connect with patients for Virtual Visits (Telemedicine).  Patients are able to view lab/test results, encounter notes, upcoming appointments, etc.  Non-urgent messages can be sent to your provider as well.   To learn more about what you can do with MyChart, go to [email or web address].    Your next appointment:   1 year(s)  The format for your next appointment:   In Person  Provider:   K. Mali Hilty, MD

## 2021-03-14 ENCOUNTER — Telehealth: Payer: Self-pay | Admitting: Internal Medicine

## 2021-03-14 NOTE — Telephone Encounter (Signed)
Pt c/o medication issue:  1. Name of Medication: ezetimibe (ZETIA) 10 MG tablet  2. How are you currently taking this medication (dosage and times per day)?  Patient has not started the medication yet   3. Are you having a reaction (difficulty breathing--STAT)?   4. What is your medication issue? Cost  Patient said the generic of this medication would cost $139 because his insurance would not cover the medication. He wanted to know if there was a cheaper alternative he could be prescribed

## 2021-03-14 NOTE — Telephone Encounter (Signed)
He could try 80 mg atorvastatin - but that is not likely enough to reach [facility] - did he consider mail order or Good Rx for the zetia?  Dr. Lemmie Evens

## 2021-03-14 NOTE — Telephone Encounter (Signed)
3 month supply - $139 per pharmacy  Pharmacy tech said patient may be in donut hole  Advised he try to contact insurance for a tier exception but he asked if Dr. Debara Pickett would increase the dose of atorvastatin instead (currently on 67)   Last LDL in June was 70  Message routed to MD  OK to leave VM w/update from MD Pharmacy [facility]

## 2021-03-16 NOTE — Progress Notes (Signed)
New Patient Note  RE: Antonio Carlson MRN: [id number] DOB: 12-Jan-1946 Date of Office Visit: 03/17/2021  Consult requested by: Antonio Rockers, MD Primary care provider: [name], MD  Chief Complaint: Sinus Problem (Was referred by Dr. Marlene Carlson. ) and Asthma (States he is good while he is using his nebulizer machine.  Wants to see about biologics.)  History of Present Illness: I had the pleasure of seeing Antonio Carlson for initial evaluation at the [facility] on 03/17/2021. He is a 75 y.o. male, who is referred here by Antonio Rockers, MD (pulmonology) for the evaluation of sinus symptoms and possibility of using Biologics.  Rhinitis:  He reports symptoms of nasal congestion, rhinorrhea, sneezing. Symptoms have been going on for 10 years. The symptoms are present  all year around. Other triggers include exposure to none. Anosmia: yes. Headache: no. He has used prednisone with fair improvement in symptoms - usually needs at least a course every few months. He just finished a course yesterday. He has not tried any recent nasal sprays. Sinus infections: no. Previous work up includes: not recently. No prior AIT. Previous ENT evaluation: yes and had 2 sinus surgery - last one was in 2011. Previous sinus imaging: not recently. History of nasal polyps: yes - 2 polypectomies. Last eye exam: last year. History of reflux: takes omeprazole daily.  01/11/2003 CT sinus: "IMPRESSION  1)    SIGNIFICANT NASAL POLYPOSIS RESULTING IN PANSINUSITIS.  2)    THERE IS MILD EXPANSION OF BOTH ETHMOIDS BILATERALLY."  Asthma: Followed by pulmonology.  He reports symptoms of chest tightness, shortness of breath,  wheezing for 30+ years. Current medications include Pulmicort 0.'25mg'$  nebulizer BID and Perforomist BID and albuterol prn which help. He reports not using aerochamber with inhalers. He tried the following inhalers: hand held inhalers don't work. Main triggers are unknown. In the last  month, frequency of symptoms: 0x/week. Frequency of nocturnal symptoms: 0x/month. Frequency of SABA use: 0x/week. Interference with physical activity: no. Sleep is undisturbed. In the last 12 months, emergency room visits/urgent care visits/doctor office visits or hospitalizations due to respiratory issues: no. In the last 12 months, oral steroids courses: not for asthma. Lifetime history of hospitalization for respiratory issues: no. Prior intubations: no. History of pneumonia: no. He was evaluated by allergist/pulmonologist in the past. Smoking exposure: quit in 1971. Up to date with flu vaccine: yes. Up to date with pneumonia vaccine: yes. Up to date with COVID-19 vaccine: yes. Prior Covid-19 infection: no.  12/30/2020 pulmonology visit: "We will be referring you to Dr Antonio Carlson service with ? Need for dupixent or alternative for your nasal symptoms"  Assessment and Plan: Fin is a 75 y.o. male with: Nasal polyposis Perennial rhinitis symptoms for 10 years.  Patient had 2 nasal polypectomies - one in the 1990s and the other in 2011.  Patient requires steroid taper every few months for his significant nasal congestion.  Tried Flonase with no benefit. Past medical history significant for asthma and NSAID allergy. Today's skin testing showed: Negative to indoor/outdoor allergens. Start Xhance (fluticasone) nasal spray 2 sprays per nostril twice a day as needed for nasal congestion.  Sample given and demonstrated proper use. If this is not covered let us know.  Read about Dupixent injections - will start PA process.  Aspirin-exacerbated respiratory disease (AERD) Followed by pulmonology.  Currently on Pulmicort 0.25 mg nebulizer twice a day and Perforomist nebulizer twice a day.  Patient states that handheld inhalers did  not work for him. Today's spirometry showed mixed obstructive and restrictive disease with 15% improvement in FEV1 post bronchodilator treatment.  Clinically feeling  unchanged. Continue nebulizers as per pulmonology.  Daily controller medication(s): continue Pulmicort and Perforomist nebulizer twice a day. May use albuterol rescue inhaler 2 puffs every 4 to 6 hours as needed for shortness of breath, chest tightness, coughing, and wheezing. May use albuterol rescue inhaler 2 puffs 5 to 15 minutes prior to strenuous physical activities. Monitor frequency of use.  The Dupixent should also help with his asthma symptoms. Continue to avoid NSAIDs.  Heartburn Continue omeprazole '20mg'$  daily as prescribed. Nothing to eat or drink for 30 minutes afterwards.  Return in about 2 months (around 05/17/2021).  Meds ordered this encounter  Medications   Fluticasone Propionate (XHANCE) 93 MCG/ACT EXHU    Sig: Place 2 sprays into the nose in the morning and at bedtime.    Dispense:  32 mL    Refill:  5    Dx: J33.0    Lab Orders  No laboratory test(s) ordered today    Other allergy screening: Food allergy: no Medication allergy: yes Aspirin - have issues with his breathing.  Amoxicillin - GI symptoms Sulfa - as a child, not sure. Hymenoptera allergy: no Urticaria: no Eczema:no History of recurrent infections suggestive of immunodeficency: no  Diagnostics: Spirometry:  Tracings reviewed. His effort: Good reproducible efforts. FVC: 2.27L FEV1: 1.37L, 50% predicted FEV1/FVC ratio: 60% Interpretation: Spirometry consistent with mixed obstructive and restrictive disease with 15% improvement in FEV1 post bronchodilator treatment. Clinically feeling unchanged.   Please see scanned spirometry results for details.  Skin Testing: Environmental allergy panel. Negative to indoor/outdoor allergens. Results discussed with patient/family.  Airborne Adult Perc - 03/17/21 1445     Time Antigen Placed 1445    Allergen Manufacturer Lavella Hammock    Location Back    Number of Test 59    1. Control-Buffer 50% Glycerol Negative    2. Control-Histamine 1 mg/ml 2+    3.  Albumin saline Negative    4. Newcastle Negative    5. [place] Negative    6. Johnson Negative    7. Millbrae Blue Negative    8. Meadow Fescue Negative    9. Perennial Rye Negative    10. Sweet Vernal Negative    11. Timothy Negative    12. Cocklebur Negative    13. Burweed Marshelder Negative    14. Ragweed, short Negative    15. Ragweed, Giant Negative    16. Plantain,  English Negative    17. Lamb's Quarters Negative    18. Sheep Sorrell Negative    19. Rough Pigweed Negative    20. Marsh Elder, Rough Negative    21. Mugwort, Common Negative    22. Ash mix Negative    23. Birch mix Negative    24. Beech American Negative    25. Box, Elder Negative    26. Cedar, red Negative    27. Cottonwood, [place] Negative    28. Elm mix Negative    29. Hickory Negative    30. Maple mix Negative    31. Oak, [place] mix Negative    32. Pecan Pollen Negative    33. Pine mix Negative    34. Sycamore Eastern Negative    35. Coldiron, Black Pollen Negative    36. Alternaria alternata Negative    37. Cladosporium Herbarum Negative    38. Aspergillus mix Negative    39. Penicillium mix Negative  40. Bipolaris sorokiniana (Helminthosporium) Negative    41. Drechslera spicifera (Curvularia) Negative    42. Mucor plumbeus Negative    43. Fusarium moniliforme Negative    44. Aureobasidium pullulans (pullulara) Negative    45. Rhizopus oryzae Negative    46. Botrytis cinera Negative    47. Epicoccum nigrum Negative    48. Phoma betae Negative    49. Candida Albicans Negative    50. Trichophyton mentagrophytes Negative    51. Mite, D Farinae  5,000 AU/ml Negative    52. Mite, D Pteronyssinus  5,000 AU/ml Negative    53. Cat Hair 10,000 BAU/ml Negative    54.  Dog Epithelia Negative    55. Mixed Feathers Negative    56. Horse Epithelia Negative    57. Cockroach, German Negative    58. Mouse Negative    59. Tobacco Leaf Negative             Intradermal - 03/17/21 1444     Time  Antigen Placed 1445    Allergen Manufacturer Lavella Hammock    Location Arm    Number of Test 15    Control Negative    Guatemala Negative    Johnson Negative    7 Grass Negative    Ragweed mix Negative    Weed mix Negative    Tree mix Negative    Mold 1 Negative    Mold 2 Negative    Mold 3 Negative    Mold 4 Negative    Cat Negative    Dog Negative    Cockroach Negative    Mite mix Negative             Past Medical History: Patient Active Problem List   Diagnosis Date Noted   Chronic rhinitis 03/17/2021   Heartburn 03/17/2021   Aspirin-exacerbated respiratory disease (AERD) 03/17/2021   Coronary artery disease 05/01/2019   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   CAD, multiple vessel    Angina pectoris syndrome ([place]) 12/15/2018   Nasal polyposis 03/17/2016   Upper airway cough syndrome vs cough variant component  02/18/2015   Cervical neck pain with evidence of disc disease = C8 nerve root distribution 06/11/2014   Syncope 05/31/2014   Mechanical low back pain 02/01/2014   Hip pain, left 02/05/2012   URI (upper respiratory infection) 10/01/2011   Essential hypertension 09/15/2010   RECTAL BLEEDING 09/15/2010   Blood in stool 09/11/2010   OLECRANON BURSITIS, LEFT 12/01/2007   OSTEOPENIA 10/10/2007   COLONIC POLYPS 07/08/2007   Hyperlipidemia LDL goal <130 07/08/2007   Allergic rhinitis 07/08/2007   [facility] asthma 07/08/2007   Diverticulosis of colon 07/08/2007   NEPHROLITHIASIS 07/08/2007   BENIGN PROSTATIC HYPERTROPHY, HX OF 07/08/2007   Past Medical History:  Diagnosis Date   Allergy    Anginal pain ([place])    Arthritis    Asthma    Benign prostatic hypertrophy    Coronary artery disease    Diverticulosis    Dyspnea    GERD (gastroesophageal reflux disease)    History of kidney stones    Hyperlipidemia    Hypertension    Nephrolithiasis    PVC's (premature ventricular contractions)    Syncope    Past Surgical History: Past Surgical History:  Procedure Laterality Date    APPENDECTOMY     CORONARY ARTERY BYPASS GRAFT N/A 05/01/2019   Procedure: CORONARY ARTERY BYPASS GRAFTING (CABG) TIMES FOUR USING LEFT AND RIGHT INTERNAL MAMMARY ARTERIES AND RIGHT RADIAL ARTERY WITH STERNAL PLATING;  Surgeon: [name], MD;  Location: [place];  Service: Open Heart Surgery;  Laterality: N/A;   LEFT HEART CATH AND CORONARY ANGIOGRAPHY N/A 04/18/2019   Procedure: LEFT HEART CATH AND CORONARY ANGIOGRAPHY;  Surgeon: Belva Crome, MD;  Location: [place] [facility];  Service: Cardiovascular;  Laterality: N/A;   NASAL SINUS SURGERY  Sept 2012   RADIAL ARTERY HARVEST Right 05/01/2019   Procedure: Right Radial Artery Harvest;  Surgeon: [name], MD;  Location: [place];  Service: Open Heart Surgery;  Laterality: Right;   TEE WITHOUT CARDIOVERSION N/A 05/01/2019   Procedure: TRANSESOPHAGEAL ECHOCARDIOGRAM (TEE);  Surgeon: [name], MD;  Location: [place];  Service: Open Heart Surgery;  Laterality: N/A;   Medication List:  Current Outpatient Medications  Medication Sig Dispense Refill   albuterol (VENTOLIN HFA) 108 (90 Base) MCG/ACT inhaler Inhale 2 puffs into the lungs every 6 (six) hours as needed for wheezing or shortness of breath.     atorvastatin (LIPITOR) 80 MG tablet Take 1 tablet (80 mg total) by mouth daily. TAKE 1 TABLET BY MOUTH EVERY DAY AT 6PM 90 tablet 3   budesonide (PULMICORT) 0.25 MG/2ML nebulizer solution USE 1 VIAL IN NEBULIZER TWICE DAILY 120 mL 11   calcium citrate-vitamin D 500-400 MG-UNIT chewable tablet Chew 1 tablet by mouth daily.     clopidogrel (PLAVIX) 75 MG tablet Take 1 tablet by mouth once daily 90 tablet 3   Fluticasone Propionate (XHANCE) 93 MCG/ACT EXHU Place 2 sprays into the nose in the morning and at bedtime. 32 mL 5   formoterol (PERFOROMIST) 20 MCG/2ML nebulizer solution Take 20 mcg by nebulization 2 (two) times daily.     metoprolol tartrate (LOPRESSOR) 50 MG tablet Take 1/2 tablet by mouth twice daily 90 tablet 1   montelukast  (SINGULAIR) 10 MG tablet Take 1 tablet (10 mg total) by mouth at bedtime. 90 tablet 4   Multiple Vitamins-Minerals (CENTRUM) tablet Take 1 tablet by mouth daily.     omeprazole (PRILOSEC) 20 MG capsule Take 1 capsule by mouth once daily 90 capsule 3   telmisartan (MICARDIS) 80 MG tablet Take 1 tablet (80 mg total) by mouth daily. 90 tablet 1   No current facility-administered medications for this visit.   Allergies: Allergies  Allergen Reactions   Aspirin Shortness Of Breath   Amoxicillin     GI upset Did it involve swelling of the face/tongue/throat, SOB, or low BP? No Did it involve sudden or severe rash/hives, skin peeling, or any reaction on the inside of your mouth or nose? No Did you need to seek medical attention at a hospital or doctor's office? No When did it last happen?    20+ years   If all above answers are "NO", may proceed with cephalosporin use.    Sulfonamide Derivatives Other (See Comments)    Doesn't remember   Social History: Social History   Socioeconomic History   Marital status: Married    Spouse name: Not on file   Number of children: Not on file   Years of education: Not on file   Highest education level: Not on file  Occupational History   Not on file  Tobacco Use   Smoking status: Former    Packs/day: 2.00    Years: 14.00    Pack years: 28.00    Types: Cigarettes    Quit date: 07/27/1969    Years since quitting: 51.6   Smokeless tobacco: Never  Vaping Use  Vaping Use: Never used  Substance and Sexual Activity   Alcohol use: Yes    Comment: beer occ   Drug use: Never   Sexual activity: Yes  Other Topics Concern   Not on file  Social History Narrative   Not on file   Social Determinants of Health   Financial Resource Strain: Not on file  Food Insecurity: Not on file  Transportation Needs: Not on file  Physical Activity: Not on file  Stress: Not on file  Social Connections: Not on file   Lives in a house built in 1964. Smoking:  quit in 1971 Occupation: retired  Programme researcher, broadcasting/film/video History: Environmental education officer in the house: no Charity fundraiser in the family room: yes Carpet in the bedroom: yes Heating: gas Cooling: central Pet: yes 1 dog x few years  Family History: Family History  Problem Relation Age of Onset   Heart attack Father    Problem                               Relation Asthma                                   No  Eczema                                No  Food allergy                          No  Allergic rhino conjunctivitis     No  Review of Systems  Constitutional:  Negative for appetite change, chills, fever and unexpected weight change.  HENT:  Positive for congestion. Negative for rhinorrhea.   Eyes:  Negative for itching.  Respiratory:  Negative for cough, chest tightness, shortness of breath and wheezing.   Cardiovascular:  Negative for chest pain.  Gastrointestinal:  Negative for abdominal pain.  Genitourinary:  Negative for difficulty urinating.  Skin:  Negative for rash.  Allergic/Immunologic: Negative for environmental allergies.  Neurological:  Negative for headaches.   Objective: BP 120/72   Pulse 65   Temp (!) 97.3 F (36.3 C) (Temporal)   Resp 18   Ht '5\' 8"'$  (1.727 m)   Wt 180 lb (81.6 kg)   SpO2 97%   BMI 27.37 kg/m  Body mass index is 27.37 kg/m. Physical Exam Vitals and nursing note reviewed.  Constitutional:      Appearance: Normal appearance. He is well-developed.  HENT:     Head: Normocephalic and atraumatic.     Right Ear: Tympanic membrane and external ear normal.     Left Ear: Tympanic membrane and external ear normal.     Nose:     Comments: ? Left polyps    Mouth/Throat:     Mouth: Mucous membranes are moist.     Pharynx: Oropharynx is clear.  Eyes:     Conjunctiva/sclera: Conjunctivae normal.  Cardiovascular:     Rate and Rhythm: Normal rate and regular rhythm.     Heart sounds: Normal heart sounds. No murmur heard.   No friction rub. No gallop.   Pulmonary:     Effort: Pulmonary effort is normal.     Breath sounds: Normal breath sounds. No wheezing, rhonchi or rales.  Musculoskeletal:     Cervical back: Neck  supple.  Skin:    General: Skin is warm.     Findings: No rash.  Neurological:     Mental Status: He is alert and oriented to person, place, and time.  Psychiatric:        Behavior: Behavior normal.   The plan was reviewed with the patient/family, and all questions/concerned were addressed.  It was my pleasure to see [place] today and participate in his care. Please feel free to contact me with any questions or concerns.  Sincerely,  Rexene Alberts, DO Allergy & Immunology  [facility] office: [place] office: [phone]

## 2021-03-17 ENCOUNTER — Encounter: Payer: Self-pay | Admitting: Allergy

## 2021-03-17 ENCOUNTER — Ambulatory Visit (INDEPENDENT_AMBULATORY_CARE_PROVIDER_SITE_OTHER): Payer: Medicare Other | Admitting: Allergy

## 2021-03-17 ENCOUNTER — Other Ambulatory Visit: Payer: Self-pay

## 2021-03-17 VITALS — BP 120/72 | HR 65 | Temp 97.3°F | Resp 18 | Ht 68.0 in | Wt 180.0 lb

## 2021-03-17 DIAGNOSIS — R12 Heartburn: Secondary | ICD-10-CM | POA: Diagnosis not present

## 2021-03-17 DIAGNOSIS — J339 Nasal polyp, unspecified: Secondary | ICD-10-CM

## 2021-03-17 DIAGNOSIS — J310 Chronic rhinitis: Secondary | ICD-10-CM | POA: Diagnosis not present

## 2021-03-17 DIAGNOSIS — Z886 Allergy status to analgesic agent status: Secondary | ICD-10-CM

## 2021-03-17 DIAGNOSIS — J45909 Unspecified asthma, uncomplicated: Secondary | ICD-10-CM

## 2021-03-17 MED ORDER — ATORVASTATIN CALCIUM 80 MG PO TABS: 80.0000 mg | ORAL_TABLET | Freq: Every day | ORAL | 3 refills | Status: DC

## 2021-03-17 MED ORDER — XHANCE 93 MCG/ACT NA EXHU: 2.0000 | INHALANT_SUSPENSION | Freq: Two times a day (BID) | NASAL | 5 refills | Status: DC

## 2021-03-17 NOTE — Assessment & Plan Note (Signed)
   Continue omeprazole '20mg'$  daily as prescribed. Nothing to eat or drink for 30 minutes afterwards.

## 2021-03-17 NOTE — Telephone Encounter (Signed)
Spoke with patient and relayed advice from MD.  Rx(s) sent to pharmacy electronically. He will have repeat labs in Nov 2022

## 2021-03-17 NOTE — Assessment & Plan Note (Addendum)
Perennial rhinitis symptoms for 10 years.  Patient had 2 nasal polypectomies - one in the 1990s and the other in 2011.  Patient requires steroid taper every few months for his significant nasal congestion.  Tried Flonase with no benefit. Past medical history significant for asthma and NSAID allergy.  Today's skin testing showed: Negative to indoor/outdoor allergens.  Start Xhance (fluticasone) nasal spray 2 sprays per nostril twice a day as needed for nasal congestion.   Sample given and demonstrated proper use.  If this is not covered let us know.   Read about Dupixent injections - will start PA process.

## 2021-03-17 NOTE — Assessment & Plan Note (Deleted)
Followed by pulmonology.  Currently on Pulmicort 0.25 mg nebulizer twice a day and Perforomist nebulizer twice a day.  Patient states that handheld inhalers did not work for him.  Today's spirometry showed mixed obstructive and restrictive disease with 15% improvement in FEV1 post bronchodilator treatment.  Clinically feeling unchanged. . Continue nebulizers as per pulmonology.  . Daily controller medication(s): continue Pulmicort and Perforomist nebulizer twice a day. . May use albuterol rescue inhaler 2 puffs every 4 to 6 hours as needed for shortness of breath, chest tightness, coughing, and wheezing. May use albuterol rescue inhaler 2 puffs 5 to 15 minutes prior to strenuous physical activities. Monitor frequency of use.   The Dupixent should also help with his asthma symptoms.

## 2021-03-17 NOTE — Patient Instructions (Addendum)
Today's skin testing showed: Negative to indoor/outdoor allergens.  Sinus/polyps Start Xhance (fluticasone) nasal spray 2 sprays per nostril twice a day as needed for nasal congestion.  Sample given and demonstrated proper use. If this is not covered let us know.  Read about Dupixent injections - will start paperwork for this. Tammy will be in contact regarding this.   Asthma:  Continue nebulizers as per pulmonology.  Daily controller medication(s): continue Pulmicort and perforomist nebulizer twice a day. May use albuterol rescue inhaler 2 puffs every 4 to 6 hours as needed for shortness of breath, chest tightness, coughing, and wheezing. May use albuterol rescue inhaler 2 puffs 5 to 15 minutes prior to strenuous physical activities. Monitor frequency of use.  Asthma control goals:  Full participation in all desired activities (may need albuterol before activity) Albuterol use two times or less a week on average (not counting use with activity) Cough interfering with sleep two times or less a month Oral steroids no more than once a year No hospitalizations   Drug allergies Continue to avoid NSADs - no Advil, ibuprofen, aleve. May take tylenol for pain.  Heartburn: Continue omeprazole '20mg'$  daily as prescribed. Nothing to eat or drink for 30 minutes afterwards.  Follow up in 2 months or sooner if needed.

## 2021-03-17 NOTE — Assessment & Plan Note (Addendum)
Followed by pulmonology.  Currently on Pulmicort 0.25 mg nebulizer twice a day and Perforomist nebulizer twice a day.  Patient states that handheld inhalers did not work for him.  Today's spirometry showed mixed obstructive and restrictive disease with 15% improvement in FEV1 post bronchodilator treatment.  Clinically feeling unchanged. . Continue nebulizers as per pulmonology.  . Daily controller medication(s): continue Pulmicort and Perforomist nebulizer twice a day. . May use albuterol rescue inhaler 2 puffs every 4 to 6 hours as needed for shortness of breath, chest tightness, coughing, and wheezing. May use albuterol rescue inhaler 2 puffs 5 to 15 minutes prior to strenuous physical activities. Monitor frequency of use.   The Dupixent should also help with his asthma symptoms.  Continue to avoid NSAIDs.

## 2021-03-18 IMAGING — DX DG CHEST 1V PORT
1 series · 2 of 2 positions shown · non-contrast
Comparison: PA and lateral chest 04/27/2019.

CLINICAL DATA: Status post CABG today.

EXAM:
PORTABLE CHEST 1 VIEW

[Series 1: chest · 0.14mm/px · 2 of 2 slices shown]
[im 1/2]
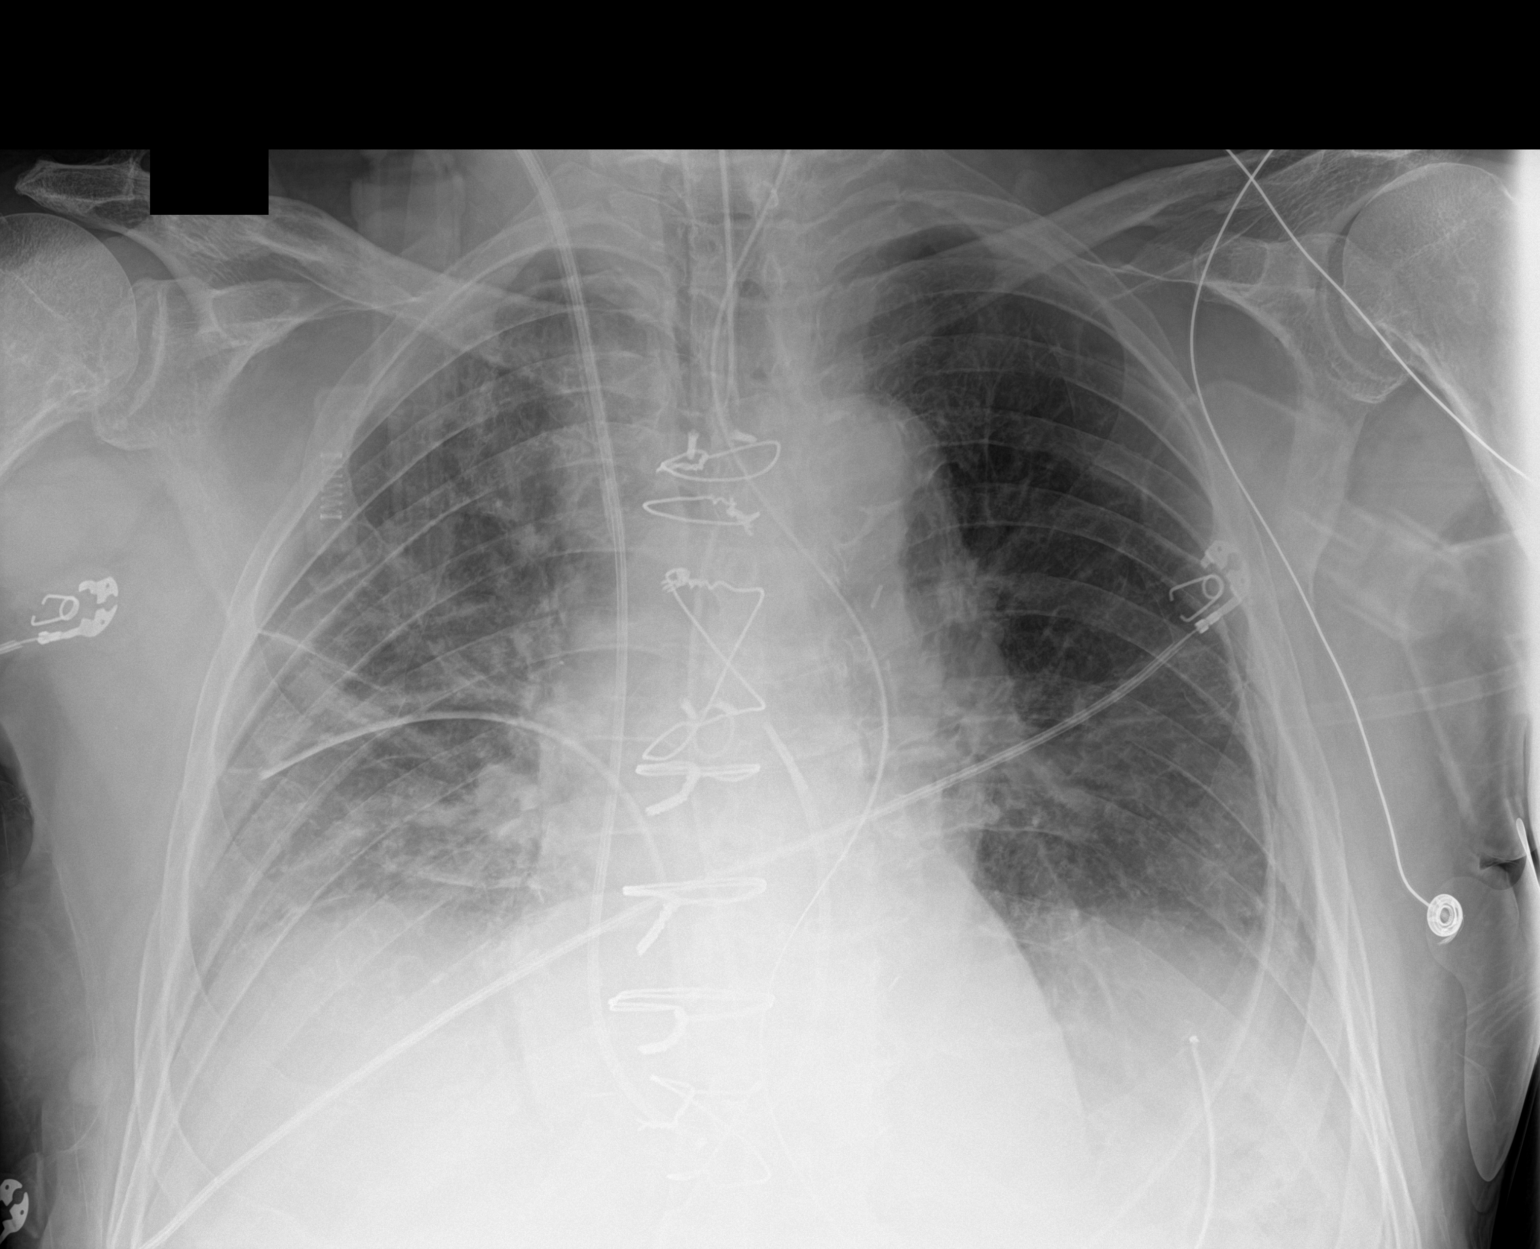
[im 2/2]
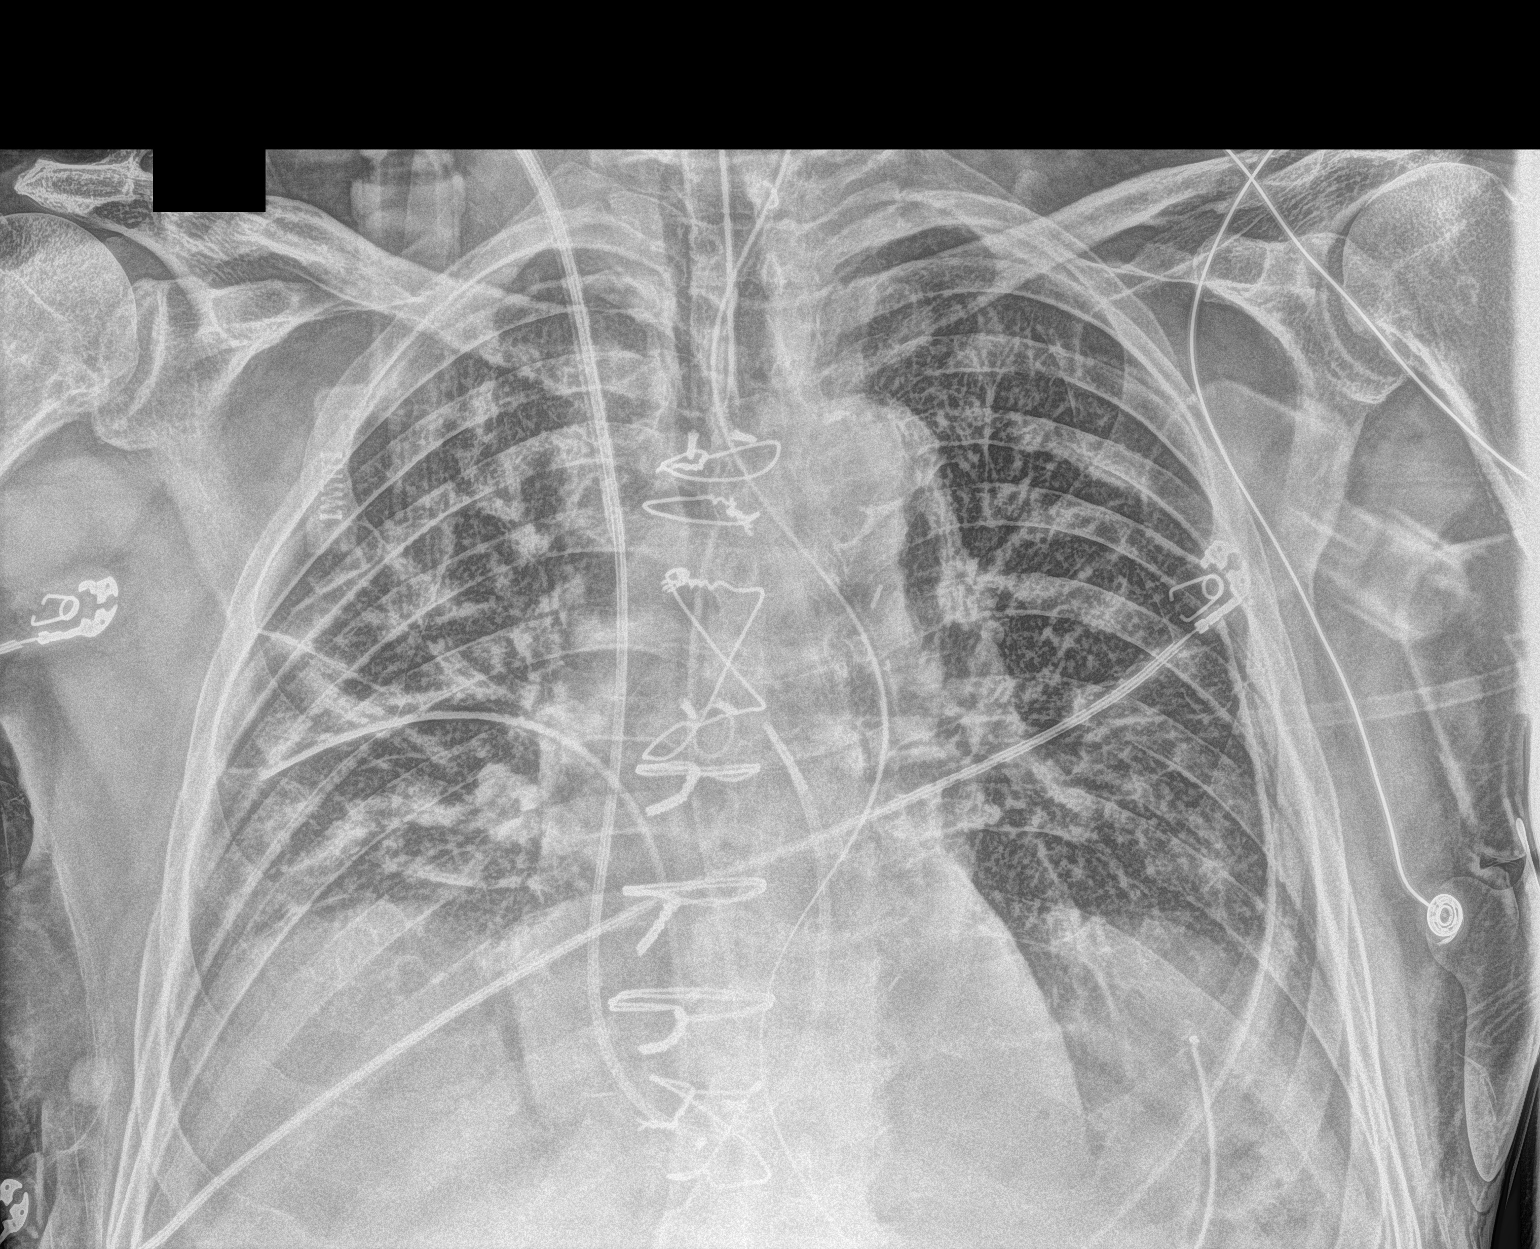

[2 of 2 positions shown; findings below may reference images not displayed]

FINDINGS: Endotracheal tube is in place with the tip in good position at the
level of the clavicular heads. NG tube tip is in the fundus of the
stomach. Right IJ approach Swan-Ganz catheter tip is in the distal
pulmonary outflow tract. Left chest tube and mediastinal drain
noted.

Heart size is normal. Atherosclerosis noted. No pneumothorax.
Scattered atelectasis is seen throughout the right lung. No acute or
focal bony abnormality.
IMPRESSION: Support tubes and lines project in good position.  No pneumothorax.

Scattered atelectasis in the right lung.

## 2021-03-19 ENCOUNTER — Telehealth: Payer: Self-pay | Admitting: *Deleted

## 2021-03-19 NOTE — Telephone Encounter (Signed)
-----   Message from Garnet Sierras, DO sent at 03/17/2021  3:34 PM EDT ----- Please start PA - Dupixent '300mg'$  every 2 weeks for nasal polyps - 2 polypectomies in the past/ requiring multiple courses of prednisone per year. If not approved for polyps then he may qualify for asthma as well. He had eos of 800 in Dec 2021. Thank you.

## 2021-03-19 NOTE — Telephone Encounter (Signed)
Called patient and advised affordability for patient would be through [facility] patient assistance and will mail patient consent to sign and send back along with his [facility] card [facility]

## 2021-03-20 ENCOUNTER — Telehealth: Payer: Self-pay | Admitting: *Deleted

## 2021-03-20 DIAGNOSIS — S5291XA Unspecified fracture of right forearm, initial encounter for closed fracture: Secondary | ICD-10-CM | POA: Diagnosis not present

## 2021-03-20 DIAGNOSIS — M66341 Spontaneous rupture of flexor tendons, right hand: Secondary | ICD-10-CM | POA: Diagnosis not present

## 2021-03-20 DIAGNOSIS — M13831 Other specified arthritis, right wrist: Secondary | ICD-10-CM | POA: Diagnosis not present

## 2021-03-20 DIAGNOSIS — M79641 Pain in right hand: Secondary | ICD-10-CM | POA: Diagnosis not present

## 2021-03-20 DIAGNOSIS — G5601 Carpal tunnel syndrome, right upper limb: Secondary | ICD-10-CM | POA: Diagnosis not present

## 2021-03-20 NOTE — Telephone Encounter (Signed)
PA has been submitted through [facility] for [facility] and is currently pending approval/denial. ID [id number], BIN [id number], GRP [id number].

## 2021-03-20 NOTE — Telephone Encounter (Signed)
PA is still pending.  

## 2021-03-21 DIAGNOSIS — G5601 Carpal tunnel syndrome, right upper limb: Secondary | ICD-10-CM | POA: Insufficient documentation

## 2021-03-21 DIAGNOSIS — S56119A Strain of flexor muscle, fascia and tendon of finger of unspecified finger at forearm level, initial encounter: Secondary | ICD-10-CM | POA: Insufficient documentation

## 2021-03-21 DIAGNOSIS — M19031 Primary osteoarthritis, right wrist: Secondary | ICD-10-CM | POA: Insufficient documentation

## 2021-03-24 DIAGNOSIS — M79641 Pain in right hand: Secondary | ICD-10-CM | POA: Diagnosis not present

## 2021-03-25 NOTE — Telephone Encounter (Signed)
PA is still pending.  

## 2021-03-26 DIAGNOSIS — M79641 Pain in right hand: Secondary | ICD-10-CM | POA: Diagnosis not present

## 2021-03-26 DIAGNOSIS — M13831 Other specified arthritis, right wrist: Secondary | ICD-10-CM | POA: Diagnosis not present

## 2021-03-26 DIAGNOSIS — G5601 Carpal tunnel syndrome, right upper limb: Secondary | ICD-10-CM | POA: Diagnosis not present

## 2021-03-26 DIAGNOSIS — M6630 Spontaneous rupture of flexor tendons, unspecified site: Secondary | ICD-10-CM | POA: Diagnosis not present

## 2021-03-28 ENCOUNTER — Telehealth: Payer: Self-pay | Admitting: *Deleted

## 2021-03-28 NOTE — Telephone Encounter (Signed)
Antonio Carlson 75 year old male is requesting preoperative cardiac evaluation for right carpal tunnel release.  He was last seen in the clinic on 03/12/2021.  He was doing well at that time.  His blood pressure was initially elevated but on recheck was well controlled.  Ezetimibe was added to his medication regimen.  He denied recurrent episodes of atrial fibrillation.  Follow-up was planned for 1 year.   His PMH includes coronary artery disease status post CABG x4 10/20, asthma, HTN, dyslipidemia, bilateral hip pain, bilateral varicose veins, and PAF.   May his Plavix be held prior to his procedure?  Thank you for your help.  Please direct your response to CV DIV preop pool.  [name]. Rovena Hearld NP-C    03/28/2021, 2:29 PM [address] Office [phone] Fax [phone]

## 2021-03-28 NOTE — Telephone Encounter (Signed)
   Primary Cardiologist: Pixie Casino, MD  Chart reviewed as part of pre-operative protocol coverage. Given past medical history and time since last visit, based on ACC/AHA guidelines, JO SELLARDS would be at acceptable risk for the planned procedure without further cardiovascular testing.   His Plavix may be held for 5 days prior to his procedure.  Please resume as soon as hemostasis is achieved.  I will route this recommendation to the requesting party via Epic fax function and remove from pre-op pool.  Please call with questions.  [name]. Jameela Michna NP-C    03/28/2021, 3:10 PM [place] [address] Office [phone] Fax [phone]

## 2021-03-28 NOTE — Telephone Encounter (Signed)
Ok to hold Plavix 5 days prior to procedure - restart when bleeding risk is low after the procedure.  Dr Debara Pickett

## 2021-03-28 NOTE — Telephone Encounter (Signed)
   [facility] Pre-operative Risk Assessment    Patient Name: Antonio Carlson  DOB: March 11, 1946 MRN: [id number]    Request for surgical clearance:  What type of surgery is being performed? Right extended open carpal tunnel release  When is this surgery scheduled? 04/18/21  What type of clearance is required (medical clearance vs. Pharmacy clearance to hold med vs. Both)? medical  Are there any medications that need to be held prior to surgery and how long? Plavix 75 mg   Practice name and name of physician performing surgery?  [facility]; Dr Roseanne Kaufman  What is the office phone number?  [phone]   7.   What is the office fax number? [phone]  8.   Anesthesia type (None, local, MAC, general) ? Block with IV sedation   Raiford Simmonds 03/28/2021, 1:33 PM  _________________________________________________________________   (provider comments below)

## 2021-03-29 IMAGING — DX DG CHEST 2V
2 series · 2 of 2 positions shown · non-contrast
Comparison: Chest radiograph from 05/05/2019

CLINICAL DATA: Pleural effusion, recent CABG

EXAM:
CHEST - 2 VIEW

[dg chest 2 view (1 of 2)]
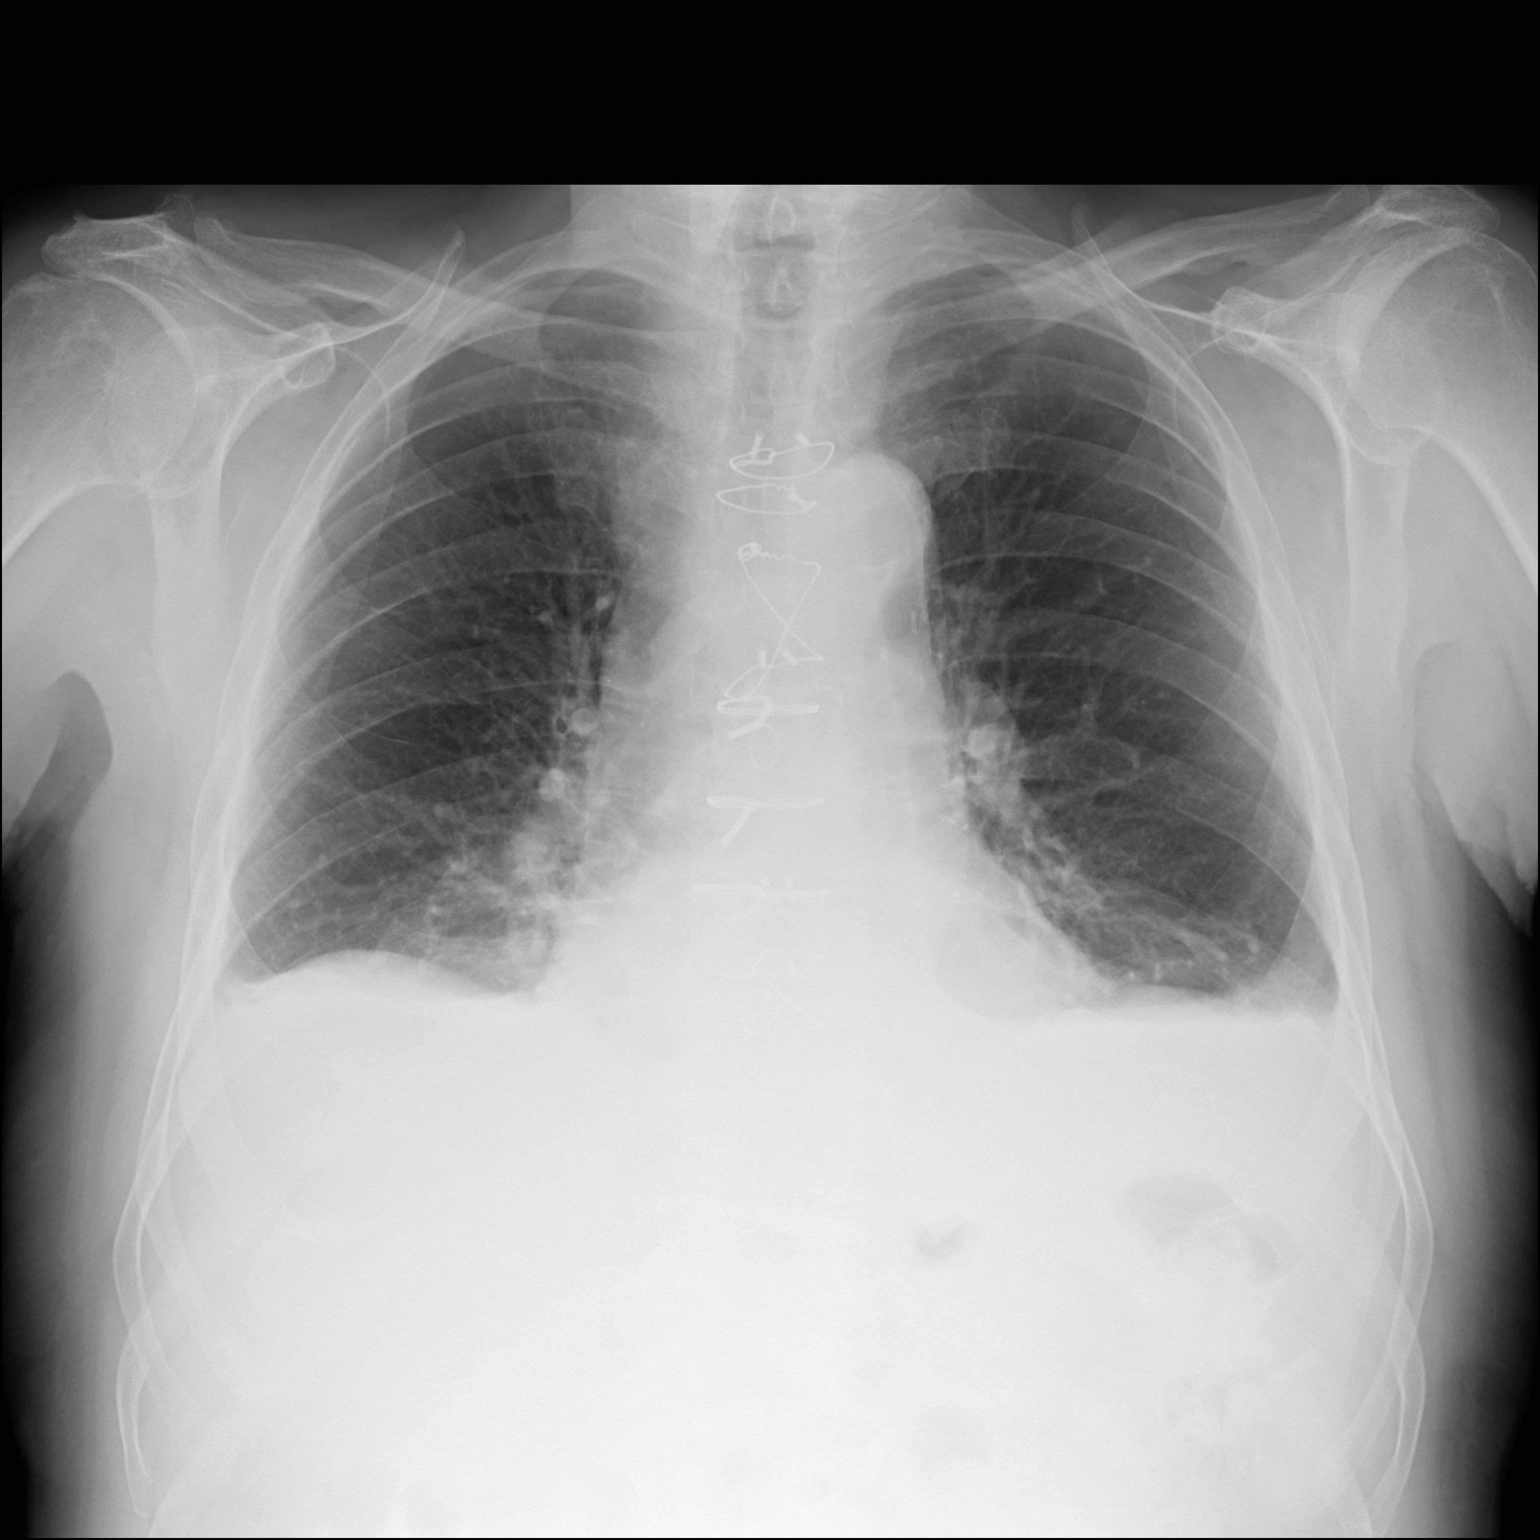

[dg chest 2 view (2 of 2)]
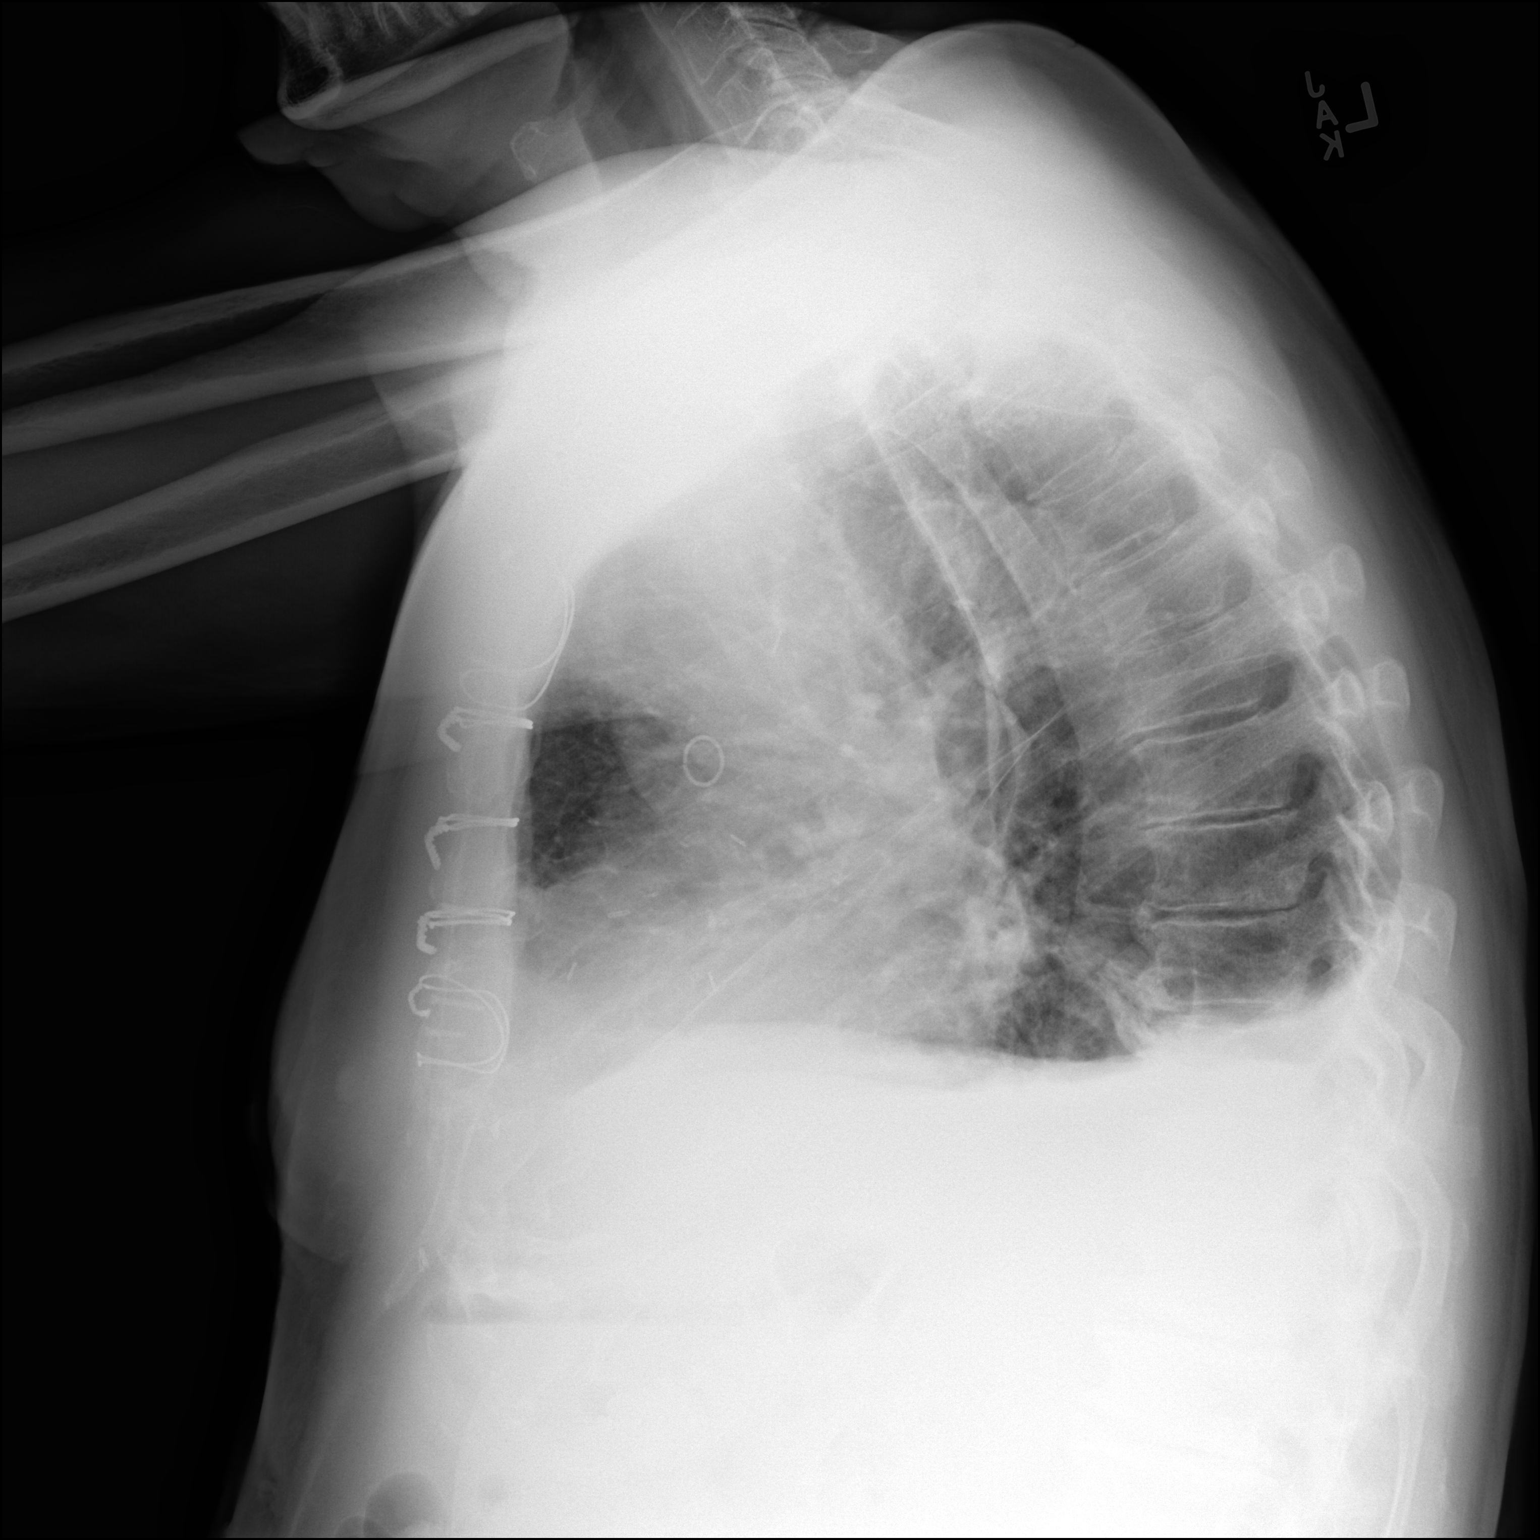

[2 of 2 positions shown; findings below may reference images not displayed]

FINDINGS: Median sternotomy wires are redemonstrated. Small bilateral pleural
effusions with associated atelectasis are similar to prior exam.
There is no pneumothorax. The cardiomediastinal silhouette is
unchanged. Vascular calcifications are seen in the aortic arch.
IMPRESSION: Small bilateral pleural effusions with associated atelectasis are
similar to prior exam.

## 2021-04-10 ENCOUNTER — Other Ambulatory Visit: Payer: Self-pay | Admitting: Internal Medicine

## 2021-04-11 NOTE — Telephone Encounter (Signed)
Patient states [facility] contacted him and stated they had denied the PA. Patient was wondering if it could be sent in again or if there was another medication similar to [place] that his insurance would cover.

## 2021-04-13 ENCOUNTER — Other Ambulatory Visit: Payer: Self-pay | Admitting: *Deleted

## 2021-04-13 MED ORDER — FORMOTEROL FUMARATE 20 MCG/2ML IN NEBU: 20.0000 ug | INHALATION_SOLUTION | Freq: Two times a day (BID) | RESPIRATORY_TRACT | 6 refills | Status: DC

## 2021-04-18 DIAGNOSIS — M19032 Primary osteoarthritis, left wrist: Secondary | ICD-10-CM | POA: Diagnosis not present

## 2021-04-18 DIAGNOSIS — S66116A Strain of flexor muscle, fascia and tendon of right little finger at wrist and hand level, initial encounter: Secondary | ICD-10-CM | POA: Diagnosis not present

## 2021-04-18 DIAGNOSIS — G8918 Other acute postprocedural pain: Secondary | ICD-10-CM | POA: Diagnosis not present

## 2021-04-18 DIAGNOSIS — S66114A Strain of flexor muscle, fascia and tendon of right ring finger at wrist and hand level, initial encounter: Secondary | ICD-10-CM | POA: Diagnosis not present

## 2021-04-18 DIAGNOSIS — G5601 Carpal tunnel syndrome, right upper limb: Secondary | ICD-10-CM | POA: Diagnosis not present

## 2021-04-18 DIAGNOSIS — Y999 Unspecified external cause status: Secondary | ICD-10-CM | POA: Diagnosis not present

## 2021-04-18 DIAGNOSIS — X58XXXA Exposure to other specified factors, initial encounter: Secondary | ICD-10-CM | POA: Diagnosis not present

## 2021-04-18 DIAGNOSIS — M19031 Primary osteoarthritis, right wrist: Secondary | ICD-10-CM | POA: Diagnosis not present

## 2021-05-01 DIAGNOSIS — M25631 Stiffness of right wrist, not elsewhere classified: Secondary | ICD-10-CM | POA: Diagnosis not present

## 2021-05-01 DIAGNOSIS — Z4789 Encounter for other orthopedic aftercare: Secondary | ICD-10-CM | POA: Diagnosis not present

## 2021-05-02 DIAGNOSIS — M25641 Stiffness of right hand, not elsewhere classified: Secondary | ICD-10-CM | POA: Diagnosis not present

## 2021-05-05 DIAGNOSIS — M25641 Stiffness of right hand, not elsewhere classified: Secondary | ICD-10-CM | POA: Diagnosis not present

## 2021-05-09 DIAGNOSIS — M25641 Stiffness of right hand, not elsewhere classified: Secondary | ICD-10-CM | POA: Diagnosis not present

## 2021-05-13 DIAGNOSIS — M25641 Stiffness of right hand, not elsewhere classified: Secondary | ICD-10-CM | POA: Diagnosis not present

## 2021-05-15 DIAGNOSIS — M25641 Stiffness of right hand, not elsewhere classified: Secondary | ICD-10-CM | POA: Diagnosis not present

## 2021-05-16 DIAGNOSIS — Z961 Presence of intraocular lens: Secondary | ICD-10-CM | POA: Diagnosis not present

## 2021-05-16 DIAGNOSIS — H524 Presbyopia: Secondary | ICD-10-CM | POA: Diagnosis not present

## 2021-05-16 DIAGNOSIS — H52203 Unspecified astigmatism, bilateral: Secondary | ICD-10-CM | POA: Diagnosis not present

## 2021-05-16 DIAGNOSIS — H353132 Nonexudative age-related macular degeneration, bilateral, intermediate dry stage: Secondary | ICD-10-CM | POA: Diagnosis not present

## 2021-05-19 DIAGNOSIS — M25641 Stiffness of right hand, not elsewhere classified: Secondary | ICD-10-CM | POA: Diagnosis not present

## 2021-05-21 DIAGNOSIS — Z23 Encounter for immunization: Secondary | ICD-10-CM | POA: Diagnosis not present

## 2021-05-22 ENCOUNTER — Other Ambulatory Visit: Payer: Self-pay | Admitting: Internal Medicine

## 2021-05-22 DIAGNOSIS — M25641 Stiffness of right hand, not elsewhere classified: Secondary | ICD-10-CM | POA: Diagnosis not present

## 2021-05-26 DIAGNOSIS — M25641 Stiffness of right hand, not elsewhere classified: Secondary | ICD-10-CM | POA: Diagnosis not present

## 2021-05-28 ENCOUNTER — Telehealth: Payer: Self-pay | Admitting: Internal Medicine

## 2021-05-28 MED ORDER — OMEPRAZOLE 20 MG PO CPDR: 20.0000 mg | DELAYED_RELEASE_CAPSULE | Freq: Every day | ORAL | 1 refills | Status: DC

## 2021-05-28 NOTE — Telephone Encounter (Signed)
Call made to pharmacy prescription has expired.  Refill sent in.   Call made to patient, confirmed DOB. Made aware refill has been sent. Voiced understanding.   Nothing further needed at this time.

## 2021-05-29 ENCOUNTER — Other Ambulatory Visit: Payer: Self-pay | Admitting: *Deleted

## 2021-05-29 DIAGNOSIS — M25641 Stiffness of right hand, not elsewhere classified: Secondary | ICD-10-CM | POA: Diagnosis not present

## 2021-05-29 DIAGNOSIS — I10 Essential (primary) hypertension: Secondary | ICD-10-CM

## 2021-05-29 MED ORDER — TELMISARTAN 80 MG PO TABS: 80.0000 mg | ORAL_TABLET | Freq: Every day | ORAL | 3 refills | Status: DC

## 2021-06-02 DIAGNOSIS — M25641 Stiffness of right hand, not elsewhere classified: Secondary | ICD-10-CM | POA: Diagnosis not present

## 2021-06-04 ENCOUNTER — Ambulatory Visit: Payer: Medicare Other | Admitting: Allergy

## 2021-06-04 DIAGNOSIS — M25641 Stiffness of right hand, not elsewhere classified: Secondary | ICD-10-CM | POA: Diagnosis not present

## 2021-06-10 DIAGNOSIS — M25552 Pain in left hip: Secondary | ICD-10-CM | POA: Diagnosis not present

## 2021-06-10 DIAGNOSIS — Z79899 Other long term (current) drug therapy: Secondary | ICD-10-CM | POA: Diagnosis not present

## 2021-06-10 LAB — LIPID PANEL
Chol/HDL Ratio: 3.6 ratio (ref 0.0–5.0)
Cholesterol, Total: 159 mg/dL (ref 100–199)
HDL: 44 mg/dL (ref >39)
LDL Chol Calc (NIH): 99 mg/dL (ref 0–99)
Triglycerides: 86 mg/dL (ref 0–149)
VLDL Cholesterol Cal: 16 mg/dL (ref 5–40)

## 2021-06-13 DIAGNOSIS — M25641 Stiffness of right hand, not elsewhere classified: Secondary | ICD-10-CM | POA: Diagnosis not present

## 2021-06-18 ENCOUNTER — Telehealth: Payer: Self-pay | Admitting: Internal Medicine

## 2021-06-18 NOTE — Telephone Encounter (Signed)
Spoke to patient 11/15 lipid panel results given.Dr.Hilty advised continue current medications.

## 2021-06-18 NOTE — Telephone Encounter (Signed)
Patient returning call for lab results. 

## 2021-07-01 ENCOUNTER — Ambulatory Visit (INDEPENDENT_AMBULATORY_CARE_PROVIDER_SITE_OTHER): Payer: Medicare Other

## 2021-07-01 ENCOUNTER — Ambulatory Visit: Payer: Medicare Other | Admitting: Internal Medicine

## 2021-07-01 ENCOUNTER — Encounter: Payer: Self-pay | Admitting: Adult Health

## 2021-07-01 ENCOUNTER — Other Ambulatory Visit: Payer: Self-pay

## 2021-07-01 ENCOUNTER — Ambulatory Visit (INDEPENDENT_AMBULATORY_CARE_PROVIDER_SITE_OTHER): Payer: Medicare Other | Admitting: Adult Health

## 2021-07-01 VITALS — BP 120/70 | HR 80 | Temp 98.2°F | Ht 68.0 in | Wt 179.0 lb

## 2021-07-01 DIAGNOSIS — J449 Chronic obstructive pulmonary disease, unspecified: Secondary | ICD-10-CM | POA: Diagnosis not present

## 2021-07-01 DIAGNOSIS — J45909 Unspecified asthma, uncomplicated: Secondary | ICD-10-CM

## 2021-07-01 DIAGNOSIS — E785 Hyperlipidemia, unspecified: Secondary | ICD-10-CM

## 2021-07-01 DIAGNOSIS — I2510 Atherosclerotic heart disease of native coronary artery without angina pectoris: Secondary | ICD-10-CM | POA: Diagnosis not present

## 2021-07-01 DIAGNOSIS — I10 Essential (primary) hypertension: Secondary | ICD-10-CM

## 2021-07-01 DIAGNOSIS — J339 Nasal polyp, unspecified: Secondary | ICD-10-CM

## 2021-07-01 DIAGNOSIS — Z886 Allergy status to analgesic agent status: Secondary | ICD-10-CM | POA: Diagnosis not present

## 2021-07-01 DIAGNOSIS — M25641 Stiffness of right hand, not elsewhere classified: Secondary | ICD-10-CM | POA: Diagnosis not present

## 2021-07-01 LAB — BASIC METABOLIC PANEL
BUN: 22 mg/dL (ref 6–23)
CO2: 31 mEq/L (ref 19–32)
Calcium: 9.5 mg/dL (ref 8.4–10.5)
Chloride: 100 mEq/L (ref 96–112)
Creatinine, Ser: 0.93 mg/dL (ref 0.40–1.50)
GFR: 78.99 mL/min (ref >60.00)
Glucose, Bld: 87 mg/dL (ref 70–99)
Potassium: 4.3 mEq/L (ref 3.5–5.1)
Sodium: 139 mEq/L (ref 135–145)

## 2021-07-01 LAB — CBC WITH DIFFERENTIAL/PLATELET
Basophils Absolute: 0.1 10*3/uL (ref 0.0–0.1)
Basophils Relative: 0.8 % (ref 0.0–3.0)
Eosinophils Absolute: 0.5 10*3/uL (ref 0.0–0.7)
Eosinophils Relative: 4.5 % (ref 0.0–5.0)
HCT: 47.2 % (ref 39.0–52.0)
Hemoglobin: 15.4 g/dL (ref 13.0–17.0)
Lymphocytes Relative: 22.1 % (ref 12.0–46.0)
Lymphs Abs: 2.4 10*3/uL (ref 0.7–4.0)
MCHC: 32.7 g/dL (ref 30.0–36.0)
MCV: 84.6 fl (ref 78.0–100.0)
Monocytes Absolute: 1.1 10*3/uL — ABNORMAL HIGH (ref 0.1–1.0)
Monocytes Relative: 9.9 % (ref 3.0–12.0)
Neutro Abs: 6.7 10*3/uL (ref 1.4–7.7)
Neutrophils Relative %: 62.7 % (ref 43.0–77.0)
Platelets: 280 10*3/uL (ref 150.0–400.0)
RBC: 5.58 Mil/uL (ref 4.22–5.81)
RDW: 16.5 % — ABNORMAL HIGH (ref 11.5–15.5)
WBC: 10.7 10*3/uL — ABNORMAL HIGH (ref 4.0–10.5)

## 2021-07-01 LAB — HEPATIC FUNCTION PANEL
ALT: 22 U/L (ref 0–53)
AST: 21 U/L (ref 0–37)
Albumin: 4.1 g/dL (ref 3.5–5.2)
Alkaline Phosphatase: 143 U/L — ABNORMAL HIGH (ref 39–117)
Bilirubin, Direct: 0.2 mg/dL (ref 0.0–0.3)
Total Bilirubin: 1 mg/dL (ref 0.2–1.2)
Total Protein: 7 g/dL (ref 6.0–8.3)

## 2021-07-01 LAB — TSH: TSH: 1.17 u[IU]/mL (ref 0.35–5.50)

## 2021-07-01 NOTE — Assessment & Plan Note (Signed)
Under control continue on present regimen  Plan  Patient Instructions  Continue on Budesonide and Perforomist Neb Twice daily   Continue on Singulair daily  Albuterol inhaler as needed   Continue on follow up with Allergist as planned.   Low fat cholesterol diet  Activity as tolerated.   Labs today   Pneumovax vaccine today .   Follow up with Dr. Melvyn Novas  in 6 months and As needed

## 2021-07-01 NOTE — Assessment & Plan Note (Signed)
Continue current regimen, low-cholesterol diet.  Continue Lipitor.  Labs today.  Plan  Patient Instructions  Continue on Budesonide and Perforomist Neb Twice daily   Continue on Singulair daily  Albuterol inhaler as needed   Continue on follow up with Allergist as planned.   Low fat cholesterol diet  Activity as tolerated.   Labs today   Pneumovax vaccine today .   Follow up with Dr. Melvyn Novas  in 6 months and As needed

## 2021-07-01 NOTE — Progress Notes (Signed)
@Patient  ID: Antonio Carlson, male    DOB: 07-31-1945, 75 y.o.   MRN: [id number]  Chief Complaint  Patient presents with   Follow-up    Referring provider: [name], MD  HPI: 75 year old male former smoker followed for triad asthma (elevated eosinophils ) Patient is followed for primary care by Dr. Melvyn Novas Medical history significant for hyperlipidemia and hypertension, coronary artery disease status post CABG in October 2020, A. fib    TEST/EVENTS :  PFT's  09/16/2015  FEV1 1.81 (61 % ) ratio 56  p 44 % improvement from saba with DLCO  81/81c % corrects to 107  % for alv volume  Done prior to any am meds    Allergy profile 10/09/19   >  Eos 0.9 /  IgE  106  07/01/2021 Follow up : Triad Asthma , HTN , Hyperlipidemia  Patient returns for a 35-month follow-up.  Patient has underlying triad asthma, hypertension and hyperlipidemia. Patient says her normal asthma.  He is on Pulmicort and Perforomist nebulizer twice daily, Singulair daily.  He is following with the allergist and getting ready to start [place].  Has a history of nasal polyposis.  Denies any increased albuterol use-rarely uses.   Patient has hypertension and is controlled on Micardis and metoprolol.  States he is tolerating well.  We discussed a low-fat cholesterol diet.  He remains on Lipitor 80 mg.  Lab work November 2022 showed controlled cholesterol with total" 159 triglycerides 86 HDL 44 and LDL 99.  Says his GERD is under control with Prilosec daily.    Flu shot utd . Due for Pneumovax .  Covid vaccine x 3 . Discussed getting the booster.   Remains active , travels.  Recently had hand surgery for tendon repair and bone spur. Going through PT.    Allergies  Allergen Reactions   Aspirin Shortness Of Breath   Amoxicillin     GI upset Did it involve swelling of the face/tongue/throat, SOB, or low BP? No Did it involve sudden or severe rash/hives, skin peeling, or any reaction on the inside of your mouth or  nose? No Did you need to seek medical attention at a hospital or doctor's office? No When did it last happen?    20+ years   If all above answers are "NO", may proceed with cephalosporin use.    Sulfonamide Derivatives Other (See Comments)    Doesn't remember    Immunization History  Administered Date(s) Administered   Fluad Quad(high Dose 65+) 05/05/2019   Influenza Split 07/17/2011, 05/10/2012, 04/26/2014   Influenza Whole 05/17/2008, 06/11/2009   Influenza, High Dose Seasonal PF 05/08/2017, 05/05/2018, 05/21/2021   Influenza,inj,Quad PF,6+ Mos 08/14/2013, 04/29/2015   Influenza-Unspecified 04/27/2016   PFIZER(Purple Top)SARS-COV-2 Vaccination 10/21/2019, 11/14/2019   Pneumococcal Conjugate-13 08/14/2013   Pneumococcal Polysaccharide-23 06/11/2009   Tdap 08/01/2015    Past Medical History:  Diagnosis Date   Allergy    Anginal pain (HCC)    Arthritis    Asthma    Benign prostatic hypertrophy    Coronary artery disease    Diverticulosis    Dyspnea    GERD (gastroesophageal reflux disease)    History of kidney stones    Hyperlipidemia    Hypertension    Nephrolithiasis    PVC's (premature ventricular contractions)    Syncope     Tobacco History: Social History   Tobacco Use  Smoking Status Former   Packs/day: 2.00   Years: 14.00   Pack years: 28.00  Types: Cigarettes   Quit date: 07/27/1969   Years since quitting: 51.9  Smokeless Tobacco Never   Counseling given: Not Answered   Outpatient Medications Prior to Visit  Medication Sig Dispense Refill   albuterol (VENTOLIN HFA) 108 (90 Base) MCG/ACT inhaler Inhale 2 puffs into the lungs every 6 (six) hours as needed for wheezing or shortness of breath.     atorvastatin (LIPITOR) 80 MG tablet Take 1 tablet (80 mg total) by mouth daily. TAKE 1 TABLET BY MOUTH EVERY DAY AT 6PM 90 tablet 3   budesonide (PULMICORT) 0.25 MG/2ML nebulizer solution USE 1 VIAL IN NEBULIZER TWICE DAILY 120 mL 11   calcium citrate-vitamin  D 500-400 MG-UNIT chewable tablet Chew 1 tablet by mouth daily.     clopidogrel (PLAVIX) 75 MG tablet TAKE 1 TABLET BY MOUTH EVERY DAY 90 tablet 3   Fluticasone Propionate (XHANCE) 93 MCG/ACT EXHU Place 2 sprays into the nose in the morning and at bedtime. 32 mL 5   formoterol (PERFOROMIST) 20 MCG/2ML nebulizer solution Take 2 mLs (20 mcg total) by nebulization 2 (two) times daily. 120 mL 6   metoprolol tartrate (LOPRESSOR) 50 MG tablet TAKE 1/2 TABLET BY MOUTH TWICE A DAY 90 tablet 1   montelukast (SINGULAIR) 10 MG tablet Take 1 tablet (10 mg total) by mouth at bedtime. 90 tablet 4   Multiple Vitamins-Minerals (CENTRUM) tablet Take 1 tablet by mouth daily.     omeprazole (PRILOSEC) 20 MG capsule Take 1 capsule (20 mg total) by mouth daily. 90 capsule 1   telmisartan (MICARDIS) 80 MG tablet Take 1 tablet (80 mg total) by mouth daily. 90 tablet 3   No facility-administered medications prior to visit.     Review of Systems:   Constitutional:   No  weight loss, night sweats,  Fevers, chills, fatigue, or  lassitude.  HEENT:   No headaches,  Difficulty swallowing,  Tooth/dental problems, or  Sore throat,                No sneezing, itching, ear ache,   CV:  No chest pain,  Orthopnea, PND, swelling in lower extremities, anasarca, dizziness, palpitations, syncope.   GI  No heartburn, indigestion, abdominal pain, nausea, vomiting, diarrhea, change in bowel habits, loss of appetite, bloody stools.   Resp:  No excess mucus, no productive cough,  No non-productive cough,  No coughing up of blood.  No change in color of mucus.  No wheezing.  No chest wall deformity  Skin: no rash or lesions.  GU: no dysuria, change in color of urine, no urgency or frequency.  No flank pain, no hematuria   MS:  No joint pain or swelling.  No decreased range of motion.  No back pain.    Physical Exam  BP 120/70 (BP Location: Left Arm, Patient Position: Sitting, Cuff Size: Normal)   Pulse 80   Temp 98.2 F  (36.8 C) (Oral)   Ht 5\' 8"  (1.727 m)   Wt 179 lb (81.2 kg)   SpO2 98%   BMI 27.22 kg/m   GEN: A/Ox3; pleasant , NAD, well nourished    HEENT:  [place]/AT,   NOSE-clear, THROAT-clear, no lesions, no postnasal drip or exudate noted.   NECK:  Supple w/ fair ROM; no JVD; normal carotid impulses w/o bruits; no thyromegaly or nodules palpated; no lymphadenopathy.    RESP  Clear  P & A; w/o, wheezes/ rales/ or rhonchi. no accessory muscle use, no dullness to percussion  CARD:  RRR,  no m/r/g, no peripheral edema, pulses intact, no cyanosis or clubbing.  GI:   Soft & nt; nml bowel sounds; no organomegaly or masses detected.   Musco: Warm bil, no deformities or joint swelling noted.   Neuro: alert, no focal deficits noted.    Skin: Warm, no lesions or rashes    Lab Results:  CBC    Component Value Date/Time   WBC 9.6 07/01/2020 1041   RBC 5.69 07/01/2020 1041   HGB 15.8 07/01/2020 1041   HCT 47.0 07/01/2020 1041   PLT 332.0 07/01/2020 1041   MCV 82.7 07/01/2020 1041   MCH 29.3 05/03/2019 0548   MCHC 33.6 07/01/2020 1041   RDW 15.0 07/01/2020 1041   LYMPHSABS 1.9 07/01/2020 1041   MONOABS 1.2 (H) 07/01/2020 1041   EOSABS 0.8 (H) 07/01/2020 1041   BASOSABS 0.1 07/01/2020 1041    BMET    Component Value Date/Time   NA 141 12/30/2020 1034   NA 143 05/29/2019 1055   K 4.5 12/30/2020 1034   CL 104 12/30/2020 1034   CO2 32 12/30/2020 1034   GLUCOSE 92 12/30/2020 1034   BUN 18 12/30/2020 1034   BUN 17 05/29/2019 1055   CREATININE 0.99 12/30/2020 1034   CALCIUM 9.1 12/30/2020 1034   GFRNONAA 56 (L) 05/29/2019 1055   GFRAA 65 05/29/2019 1055    BNP No results found for: BNP  ProBNP No results found for: PROBNP  Imaging: No results found.    PFT Results Latest Ref Rng & Units 04/27/2019 09/16/2015  FVC-Pre L 3.36 2.76  FVC-Predicted Pre % 86 68  FVC-Post L 3.70 3.22  FVC-Predicted Post % 94 80  Pre FEV1/FVC % % 56 45  Post FEV1/FCV % % 57 56  FEV1-Pre L 1.87  1.25  FEV1-Predicted Pre % 66 42  FEV1-Post L 2.11 1.81  DLCO uncorrected ml/min/mmHg 24.49 24.21  DLCO UNC% % 103 81  DLCO corrected ml/min/mmHg 23.27 24.14  DLCO COR %Predicted % 98 81  DLVA Predicted % 109 107  TLC L 8.31 7.53  TLC % Predicted % 125 113  RV % Predicted % 195 187    No results found for: NITRICOXIDE      Assessment & Plan:   Triad asthma Under control continue on present regimen  Plan  Patient Instructions  Continue on Budesonide and Perforomist Neb Twice daily   Continue on Singulair daily  Albuterol inhaler as needed   Continue on follow up with Allergist as planned.   Low fat cholesterol diet  Activity as tolerated.   Labs today   Pneumovax vaccine today .   Follow up with Dr. Melvyn Novas  in 6 months and As needed        Essential hypertension Controlled on current regimen.  Patient education on diet Labs today  Plan  Patient Instructions  Continue on Budesonide and Perforomist Neb Twice daily   Continue on Singulair daily  Albuterol inhaler as needed   Continue on follow up with Allergist as planned.   Low fat cholesterol diet  Activity as tolerated.   Labs today   Pneumovax vaccine today .   Follow up with Dr. Melvyn Novas  in 6 months and As needed         Hyperlipidemia LDL goal <130 Continue current regimen, low-cholesterol diet.  Continue Lipitor.  Labs today.  Plan  Patient Instructions  Continue on Budesonide and Perforomist Neb Twice daily   Continue on Singulair daily  Albuterol inhaler as needed  Continue on follow up with Allergist as planned.   Low fat cholesterol diet  Activity as tolerated.   Labs today   Pneumovax vaccine today .   Follow up with Dr. Melvyn Novas  in 6 months and As needed          Rexene Edison, NP 07/01/2021

## 2021-07-01 NOTE — Patient Instructions (Signed)
Continue on Budesonide and Perforomist Neb Twice daily   Continue on Singulair daily  Albuterol inhaler as needed   Continue on follow up with Allergist as planned.   Low fat cholesterol diet  Activity as tolerated.   Labs today   Pneumovax vaccine today .   Follow up with Dr. Melvyn Novas  in 6 months and As needed

## 2021-07-01 NOTE — Assessment & Plan Note (Signed)
Controlled on current regimen.  Patient education on diet Labs today  Plan  Patient Instructions  Continue on Budesonide and Perforomist Neb Twice daily   Continue on Singulair daily  Albuterol inhaler as needed   Continue on follow up with Allergist as planned.   Low fat cholesterol diet  Activity as tolerated.   Labs today   Pneumovax vaccine today .   Follow up with Dr. Melvyn Novas  in 6 months and As needed

## 2021-07-02 ENCOUNTER — Ambulatory Visit: Payer: Medicare Other

## 2021-07-02 DIAGNOSIS — J339 Nasal polyp, unspecified: Secondary | ICD-10-CM

## 2021-07-02 MED ORDER — DUPILUMAB 300 MG/2ML ~~LOC~~ SOSY
600.0000 mg | PREFILLED_SYRINGE | Freq: Once | SUBCUTANEOUS | Status: AC
  Administered 2021-07-03: 600 mg via SUBCUTANEOUS

## 2021-07-03 ENCOUNTER — Ambulatory Visit (INDEPENDENT_AMBULATORY_CARE_PROVIDER_SITE_OTHER): Payer: Medicare Other

## 2021-07-03 DIAGNOSIS — J4540 Moderate persistent asthma, uncomplicated: Secondary | ICD-10-CM

## 2021-07-03 DIAGNOSIS — M25641 Stiffness of right hand, not elsewhere classified: Secondary | ICD-10-CM | POA: Diagnosis not present

## 2021-07-03 NOTE — Progress Notes (Signed)
Immunotherapy   Patient Details  Name: IRBIN FINES MRN: [id number] Date of Birth: Sep 17, 1945  07/03/2021  [name] here to start [place] for? Patient will self administer at home. Patient received instructions and demonstration of administering Dupixent. Patient self administer in office and demonstrated correct administration. Patient received a loading dose and waited with no problems.  Frequency: every 2 weeks Consent signed and patient instructions given.   Herbie Drape 07/03/2021, 9:12 AM

## 2021-07-07 DIAGNOSIS — M25641 Stiffness of right hand, not elsewhere classified: Secondary | ICD-10-CM | POA: Diagnosis not present

## 2021-07-07 NOTE — Progress Notes (Signed)
Called and spoke with patient, provided results/recommendation per Rexene Edison NP, he verbalized understanding.  Nothing further needed.

## 2021-07-08 NOTE — Progress Notes (Signed)
Left detailed vm per DPR.  Advised to call the office with any questions.

## 2021-07-11 DIAGNOSIS — M25641 Stiffness of right hand, not elsewhere classified: Secondary | ICD-10-CM | POA: Diagnosis not present

## 2021-07-14 ENCOUNTER — Telehealth: Payer: Self-pay | Admitting: *Deleted

## 2021-07-14 NOTE — Telephone Encounter (Deleted)
Received application in the mail from patient for patient assistance.  Application faxed to [facility] along with insurance information.

## 2021-07-15 DIAGNOSIS — M25641 Stiffness of right hand, not elsewhere classified: Secondary | ICD-10-CM | POA: Diagnosis not present

## 2021-07-17 NOTE — Progress Notes (Signed)
Called and spoke with patient, advised of results/recommendations per Tammy Parrett NP.  He verbalized understanding.  Nothing further needed.

## 2021-07-18 DIAGNOSIS — M25641 Stiffness of right hand, not elsewhere classified: Secondary | ICD-10-CM | POA: Diagnosis not present

## 2021-07-22 DIAGNOSIS — M25641 Stiffness of right hand, not elsewhere classified: Secondary | ICD-10-CM | POA: Diagnosis not present

## 2021-07-24 MED ORDER — DUPILUMAB 300 MG/2ML ~~LOC~~ SOSY
600.0000 mg | PREFILLED_SYRINGE | Freq: Once | SUBCUTANEOUS | Status: AC
  Administered 2021-07-03: 12:00:00 600 mg via SUBCUTANEOUS

## 2021-07-24 NOTE — Addendum Note (Signed)
Addended by: Carin Hock on: 07/24/2021 12:16 PM   Modules accepted: Orders

## 2021-07-25 DIAGNOSIS — M25641 Stiffness of right hand, not elsewhere classified: Secondary | ICD-10-CM | POA: Diagnosis not present

## 2021-07-29 ENCOUNTER — Other Ambulatory Visit: Payer: Self-pay

## 2021-07-29 ENCOUNTER — Ambulatory Visit (INDEPENDENT_AMBULATORY_CARE_PROVIDER_SITE_OTHER): Payer: Medicare Other | Admitting: Allergy

## 2021-07-29 ENCOUNTER — Encounter: Payer: Self-pay | Admitting: Allergy

## 2021-07-29 VITALS — Ht 68.0 in

## 2021-07-29 DIAGNOSIS — R12 Heartburn: Secondary | ICD-10-CM | POA: Diagnosis not present

## 2021-07-29 DIAGNOSIS — Z886 Allergy status to analgesic agent status: Secondary | ICD-10-CM

## 2021-07-29 DIAGNOSIS — T50995D Adverse effect of other drugs, medicaments and biological substances, subsequent encounter: Secondary | ICD-10-CM

## 2021-07-29 DIAGNOSIS — J339 Nasal polyp, unspecified: Secondary | ICD-10-CM

## 2021-07-29 DIAGNOSIS — J45909 Unspecified asthma, uncomplicated: Secondary | ICD-10-CM

## 2021-07-29 NOTE — Progress Notes (Signed)
RE: Antonio Carlson MRN: [id number] DOB: [birth year] Date of Telemedicine Visit: 07/29/2021  Referring provider: [name], MD Primary care provider: [name], MD  Chief Complaint: Other (Continuing Dupixent, possible reaction to the shot. Has had 3 shots. Having alot of joint pains in his side and back, some shortness of breath from minor physical activity. )   Telemedicine Follow Up Visit via Telephone: I connected with Antonio Carlson for a follow up on 07/29/21 by telephone and verified that I am speaking with the correct person using two identifiers.   I discussed the limitations, risks, security and privacy concerns of performing an evaluation and management service by telephone and the availability of in person appointments. I also discussed with the patient that there may be a patient responsible charge related to this service. The patient expressed understanding and agreed to proceed.  Patient is at home. Provider is at the office.  Visit start time: 3:15PM Visit end time: 3:31PM Insurance consent/check in by: front desk Medical consent and medical assistant/nurse: Diandra D.  History of Present Illness: He is a 76 y.o. male, who is being followed for nasal polyps on Dupixent, AERD and heartburn. His previous allergy office visit was on 03/17/2021 with Dr. Maudie Mercury. Today is a new complaint visit of discuss stopping Dupixent .  Nasal polyposis Patient started Dupixent on 07/03/2021 and since then he had 1 injection at home. He noted some increased joint pains in his back, hip and elbows after the second injection.  His main concern is about pain in his back especially when he is moving or coughing. Denies any trauma to the back.  He noted some shortness of breath with walking short distances.   Nasal congestion has cleared up. Stopped Xhance and not on any nasal sprays.   Aspirin-exacerbated respiratory disease (AERD) Currently on Pulmicort and Perforomist nebulizer  twice a day. No albuterol use.   He has some coughing  Denies any wheezing, chest tightness, nocturnal awakenings, ER/urgent care visits or prednisone use since the last visit.   Heartburn Taking omeprazole 20mg  daily with good benefit.   Patient had hand surgery on his hand 4 months ago.   Assessment and Plan: Keonte is a 76 y.o. male with: ? Reaction to [place] on 07/03/2021 for nasal polyps but noticed increased joint pains in his back, hip and elbows since then. Denies any trauma. Nasal congestion is better. Stop Dupixent and monitor symptoms. If no improvement with stopped Dupixent then recommend to follow up with PCP for further evaluation.   Nasal polyposis Past history - Perennial rhinitis symptoms for 10 years.  Patient had 2 nasal polypectomies - one in the 1990s and the other in 2011.  Patient requires steroid taper every few months for his significant nasal congestion.  Tried Flonase with no benefit. Past medical history significant for asthma and NSAID allergy. 2022 skin testing showed: Negative to indoor/outdoor allergens. Interim history - improved with Dupixent but now has joint pains. Stopped nasal sprays. Xhance not covered. Stop Dupixent for now.  Use Flonase (fluticasone) nasal spray 1 spray per nostril twice a day as needed for nasal congestion.   Aspirin-exacerbated respiratory disease (AERD) Past history - Followed by pulmonology.  Currently on Pulmicort 0.25 mg nebulizer twice a day and Perforomist nebulizer twice a day.  Patient states that handheld inhalers did not work for him. 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  Clinically feeling unchanged.  interim history - noted some DOE. Continue nebulizers as per pulmonology.  Daily controller medication(s): continue Pulmicort and Perforomist nebulizer twice a day. May use albuterol rescue inhaler 2 puffs every 4 to  6 hours as needed for shortness of breath, chest tightness, coughing, and wheezing. May use albuterol rescue inhaler 2 puffs 5 to 15 minutes prior to strenuous physical activities. Monitor frequency of use.   Heartburn Continue omeprazole 20mg  daily as prescribed. Nothing to eat or drink for 30 minutes afterwards.   Return in about 3 months (around 10/27/2021).  No orders of the defined types were placed in this encounter.  Lab Orders  No laboratory test(s) ordered today    Diagnostics: None.  Medication List:  Current Outpatient Medications  Medication Sig Dispense Refill   albuterol (VENTOLIN HFA) 108 (90 Base) MCG/ACT inhaler Inhale 2 puffs into the lungs every 6 (six) hours as needed for wheezing or shortness of breath.     atorvastatin (LIPITOR) 80 MG tablet Take 1 tablet (80 mg total) by mouth daily. TAKE 1 TABLET BY MOUTH EVERY DAY AT 6PM 90 tablet 3   budesonide (PULMICORT) 0.25 MG/2ML nebulizer solution USE 1 VIAL IN NEBULIZER TWICE DAILY 120 mL 11   calcium citrate-vitamin D 500-400 MG-UNIT chewable tablet Chew 1 tablet by mouth daily.     clopidogrel (PLAVIX) 75 MG tablet TAKE 1 TABLET BY MOUTH EVERY DAY 90 tablet 3   Fluticasone Propionate (XHANCE) 93 MCG/ACT EXHU Place 2 sprays into the nose in the morning and at bedtime. 32 mL 5   formoterol (PERFOROMIST) 20 MCG/2ML nebulizer solution Take 2 mLs (20 mcg total) by nebulization 2 (two) times daily. 120 mL 6   metoprolol tartrate (LOPRESSOR) 50 MG tablet TAKE 1/2 TABLET BY MOUTH TWICE A DAY 90 tablet 1   montelukast (SINGULAIR) 10 MG tablet Take 1 tablet (10 mg total) by mouth at bedtime. 90 tablet 4   Multiple Vitamins-Minerals (CENTRUM) tablet Take 1 tablet by mouth daily.     omeprazole (PRILOSEC) 20 MG capsule Take 1 capsule (20 mg total) by mouth daily. 90 capsule 1   telmisartan (MICARDIS) 80 MG tablet Take 1 tablet (80 mg total) by mouth daily. 90 tablet 3   No current facility-administered medications for this visit.    Allergies: Allergies  Allergen Reactions   Aspirin Shortness Of Breath   Amoxicillin     GI upset Did it involve swelling of the face/tongue/throat, SOB, or low BP? No Did it involve sudden or severe rash/hives, skin peeling, or any reaction on the inside of your mouth or nose? No Did you need to seek medical attention at a hospital or doctor's office? No When did it last happen?    20+ years   If all above answers are "NO", may proceed with cephalosporin use.    Sulfonamide Derivatives Other (See Comments)    Doesn't remember   I reviewed his past medical history, social history, family history, and environmental history and no significant changes have been reported from his previous visit.  Review of Systems  Constitutional:  Negative for appetite change, chills, fever and unexpected weight change.  HENT:  Negative for congestion and rhinorrhea.   Eyes:  Negative for itching.  Respiratory:  Positive for shortness of breath. Negative for cough, chest tightness and wheezing.   Cardiovascular:  Negative for chest pain.  Gastrointestinal:  Negative for abdominal pain.  Genitourinary:  Negative for difficulty urinating.  Musculoskeletal:  Positive for arthralgias and back pain. Negative for  joint swelling.  Skin:  Negative for rash.  Allergic/Immunologic: Negative for environmental allergies.  Neurological:  Positive for headaches.   Objective: Physical Exam Not obtained as encounter was done via telephone.   Previous notes and tests were reviewed.  I discussed the assessment and treatment plan with the patient. The patient was provided an opportunity to ask questions and all were answered. The patient agreed with the plan and demonstrated an understanding of the instructions. After visit summary/patient instructions available via  mail .   The patient was advised to call back or seek an in-person evaluation if the symptoms worsen or if the condition fails to improve as  anticipated.  I provided 16 minutes of non-face-to-face time during this encounter.  It was my pleasure to participate in [place] care today. Please feel free to contact me with any questions or concerns.   Sincerely,  Rexene Alberts, DO Allergy & Immunology  [facility] office: [place] office: [phone]

## 2021-07-29 NOTE — Assessment & Plan Note (Signed)
Past history - Perennial rhinitis symptoms for 10 years.  Patient had 2 nasal polypectomies - one in the 1990s and the other in 2011.  Patient requires steroid taper every few months for his significant nasal congestion.  Tried Flonase with no benefit. Past medical history significant for asthma and NSAID allergy. 2022 skin testing showed: Negative to indoor/outdoor allergens. Interim history - improved with Dupixent but now has joint pains. Stopped nasal sprays. Xhance not covered.  Stop Dupixent for now.   Use Flonase (fluticasone) nasal spray 1 spray per nostril twice a day as needed for nasal congestion.

## 2021-07-29 NOTE — Assessment & Plan Note (Signed)
Past history - Followed by pulmonology.  Currently on Pulmicort 0.25 mg nebulizer twice a day and Perforomist nebulizer twice a day.  Patient states that handheld inhalers did not work for him. 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  Clinically feeling unchanged. interim history - noted some DOE.  Continue nebulizers as per pulmonology.   Daily controller medication(s): continue Pulmicort and Perforomist nebulizer twice a day.  May use albuterol rescue inhaler 2 puffs every 4 to 6 hours as needed for shortness of breath, chest tightness, coughing, and wheezing. May use albuterol rescue inhaler 2 puffs 5 to 15 minutes prior to strenuous physical activities. Monitor frequency of use.

## 2021-07-29 NOTE — Patient Instructions (Addendum)
Sinus/polyps Stop Dupixent and monitor your symptoms. If you notice persistent joint pains and back pain please go see your PCP for further evaluation.  Use Flonase (fluticasone) nasal spray 1 spray per nostril twice a day as needed for nasal congestion.   Asthma:  Continue nebulizers as per pulmonology.  Daily controller medication(s): continue Pulmicort and perforomist nebulizer twice a day. May use albuterol rescue inhaler 2 puffs every 4 to 6 hours as needed for shortness of breath, chest tightness, coughing, and wheezing. May use albuterol rescue inhaler 2 puffs 5 to 15 minutes prior to strenuous physical activities. Monitor frequency of use.  Asthma control goals:  Full participation in all desired activities (may need albuterol before activity) Albuterol use two times or less a week on average (not counting use with activity) Cough interfering with sleep two times or less a month Oral steroids no more than once a year No hospitalizations   Drug allergies Continue to avoid NSADs - no Advil, ibuprofen, aleve. May take tylenol for pain.  Heartburn: Continue omeprazole 20mg  daily as prescribed. Nothing to eat or drink for 30 minutes afterwards.  Follow up in 2-3 months or sooner if needed.

## 2021-07-29 NOTE — Assessment & Plan Note (Signed)
Started Dupixent on 07/03/2021 for nasal polyps but noticed increased joint pains in his back, hip and elbows since then. Denies any trauma. Nasal congestion is better.  Stop Dupixent and monitor symptoms.  If no improvement with stopped Dupixent then recommend to follow up with PCP for further evaluation.

## 2021-07-29 NOTE — Assessment & Plan Note (Signed)
·   Continue omeprazole 20mg  daily as prescribed. Nothing to eat or drink for 30 minutes afterwards.

## 2021-07-30 DIAGNOSIS — Z4789 Encounter for other orthopedic aftercare: Secondary | ICD-10-CM | POA: Diagnosis not present

## 2021-08-01 ENCOUNTER — Other Ambulatory Visit: Payer: Self-pay

## 2021-08-01 ENCOUNTER — Inpatient Hospital Stay (HOSPITAL_COMMUNITY)
Admission: EM | Admit: 2021-08-01 | Discharge: 2021-08-04 | DRG: 194 | Disposition: A | Discharge status: Home / Self Care | Payer: Medicare Other | Source: Ambulatory Visit | Attending: Internal Medicine | Admitting: Internal Medicine

## 2021-08-01 ENCOUNTER — Emergency Department (HOSPITAL_COMMUNITY): Payer: Medicare Other

## 2021-08-01 ENCOUNTER — Telehealth: Payer: Self-pay | Admitting: Internal Medicine

## 2021-08-01 ENCOUNTER — Encounter (HOSPITAL_COMMUNITY): Payer: Self-pay

## 2021-08-01 DIAGNOSIS — Z7902 Long term (current) use of antithrombotics/antiplatelets: Secondary | ICD-10-CM | POA: Diagnosis not present

## 2021-08-01 DIAGNOSIS — Z7951 Long term (current) use of inhaled steroids: Secondary | ICD-10-CM

## 2021-08-01 DIAGNOSIS — I10 Essential (primary) hypertension: Secondary | ICD-10-CM | POA: Diagnosis present

## 2021-08-01 DIAGNOSIS — R059 Cough, unspecified: Secondary | ICD-10-CM | POA: Diagnosis not present

## 2021-08-01 DIAGNOSIS — Z20822 Contact with and (suspected) exposure to covid-19: Secondary | ICD-10-CM | POA: Diagnosis present

## 2021-08-01 DIAGNOSIS — Z808 Family history of malignant neoplasm of other organs or systems: Secondary | ICD-10-CM

## 2021-08-01 DIAGNOSIS — J45909 Unspecified asthma, uncomplicated: Secondary | ICD-10-CM | POA: Diagnosis present

## 2021-08-01 DIAGNOSIS — J918 Pleural effusion in other conditions classified elsewhere: Secondary | ICD-10-CM | POA: Diagnosis present

## 2021-08-01 DIAGNOSIS — Z79899 Other long term (current) drug therapy: Secondary | ICD-10-CM | POA: Diagnosis not present

## 2021-08-01 DIAGNOSIS — Z8249 Family history of ischemic heart disease and other diseases of the circulatory system: Secondary | ICD-10-CM | POA: Diagnosis not present

## 2021-08-01 DIAGNOSIS — E785 Hyperlipidemia, unspecified: Secondary | ICD-10-CM | POA: Diagnosis present

## 2021-08-01 DIAGNOSIS — J189 Pneumonia, unspecified organism: Secondary | ICD-10-CM | POA: Diagnosis not present

## 2021-08-01 DIAGNOSIS — Z951 Presence of aortocoronary bypass graft: Secondary | ICD-10-CM | POA: Diagnosis not present

## 2021-08-01 DIAGNOSIS — I2510 Atherosclerotic heart disease of native coronary artery without angina pectoris: Secondary | ICD-10-CM | POA: Diagnosis present

## 2021-08-01 DIAGNOSIS — J9811 Atelectasis: Secondary | ICD-10-CM | POA: Diagnosis not present

## 2021-08-01 DIAGNOSIS — J90 Pleural effusion, not elsewhere classified: Secondary | ICD-10-CM

## 2021-08-01 DIAGNOSIS — Z8042 Family history of malignant neoplasm of prostate: Secondary | ICD-10-CM

## 2021-08-01 DIAGNOSIS — M545 Low back pain, unspecified: Secondary | ICD-10-CM | POA: Diagnosis not present

## 2021-08-01 DIAGNOSIS — R0600 Dyspnea, unspecified: Secondary | ICD-10-CM | POA: Diagnosis not present

## 2021-08-01 DIAGNOSIS — R0602 Shortness of breath: Secondary | ICD-10-CM | POA: Diagnosis not present

## 2021-08-01 DIAGNOSIS — K219 Gastro-esophageal reflux disease without esophagitis: Secondary | ICD-10-CM | POA: Diagnosis present

## 2021-08-01 DIAGNOSIS — R091 Pleurisy: Secondary | ICD-10-CM | POA: Diagnosis not present

## 2021-08-01 LAB — CBC WITH DIFFERENTIAL/PLATELET
Abs Immature Granulocytes: 0 10*3/uL (ref 0.00–0.07)
Basophils Absolute: 0.1 10*3/uL (ref 0.0–0.1)
Basophils Relative: 1 %
Eosinophils Absolute: 2.7 10*3/uL — ABNORMAL HIGH (ref 0.0–0.5)
Eosinophils Relative: 20 %
HCT: 42.8 % (ref 39.0–52.0)
Hemoglobin: 14.1 g/dL (ref 13.0–17.0)
Lymphocytes Relative: 14 %
Lymphs Abs: 1.9 10*3/uL (ref 0.7–4.0)
MCH: 27.4 pg (ref 26.0–34.0)
MCHC: 32.9 g/dL (ref 30.0–36.0)
MCV: 83.1 fL (ref 80.0–100.0)
Monocytes Absolute: 0.5 10*3/uL (ref 0.1–1.0)
Monocytes Relative: 4 %
Neutro Abs: 8.4 10*3/uL — ABNORMAL HIGH (ref 1.7–7.7)
Neutrophils Relative %: 61 %
Platelets: 463 10*3/uL — ABNORMAL HIGH (ref 150–400)
RBC: 5.15 MIL/uL (ref 4.22–5.81)
RDW: 14.9 % (ref 11.5–15.5)
WBC: 13.7 10*3/uL — ABNORMAL HIGH (ref 4.0–10.5)
nRBC: 0 % (ref 0.0–0.2)

## 2021-08-01 LAB — BASIC METABOLIC PANEL
Anion gap: 10 (ref 5–15)
BUN: 16 mg/dL (ref 8–23)
CO2: 24 mmol/L (ref 22–32)
Calcium: 8.8 mg/dL — ABNORMAL LOW (ref 8.9–10.3)
Chloride: 103 mmol/L (ref 98–111)
Creatinine, Ser: 1.04 mg/dL (ref 0.61–1.24)
GFR, Estimated: >60 mL/min (ref >60)
Glucose, Bld: 130 mg/dL — ABNORMAL HIGH (ref 70–99)
Potassium: 3.8 mmol/L (ref 3.5–5.1)
Sodium: 137 mmol/L (ref 135–145)

## 2021-08-01 LAB — TROPONIN I (HIGH SENSITIVITY)
Troponin I (High Sensitivity): 8 ng/L (ref <18)
Troponin I (High Sensitivity): 10 ng/L (ref <18)

## 2021-08-01 LAB — RESP PANEL BY RT-PCR (FLU A&B, COVID) ARPGX2
Influenza A by PCR: NEGATIVE
Influenza B by PCR: NEGATIVE
SARS Coronavirus 2 by RT PCR: NEGATIVE

## 2021-08-01 NOTE — Telephone Encounter (Signed)
ATC Patient. LM to call office back.

## 2021-08-01 NOTE — Telephone Encounter (Signed)
Called and spoke with Joellen Jersey. She stated that the patient was seen in her urgent care clinic today since he was not able to get an appt with our office today. She got an CXR on the patient and the CXR revealed a moderate right pleural effusion. Patient is very symptomatic with right sided back pain as well as SOB. He tends to feel the pain the most when he is taking a deep breath.   She wanted to know what MW would recommend in regards to the pleural effusion.   MW, can you please advise? Thanks!

## 2021-08-01 NOTE — Telephone Encounter (Signed)
Strongly rec  go to UC locally so he can be treated as well and eval for other causes for back and abdominal pain which we can't really evaluate acutely in the office anyway

## 2021-08-01 NOTE — ED Provider Triage Note (Signed)
Emergency Medicine Provider Triage Evaluation Note  Antonio Carlson , a 76 y.o. male  was evaluated in triage.  Pt complains of right-sided chest and back pain.  Patient states that emptiness began on Christmas Day.  He states that he has had worsening dyspnea on exertion and pain that he describes as in his right lower quadrant that radiates to his right flank.  He was evaluated to urgent care today and shown that he had a right sided pleural effusion and was requested to come here for IV antibiotics.  Patient denies fevers.  He does endorse intermittent chest pain along with his shortness of breath.  Review of Systems  Positive: See above Negative:   Physical Exam  BP (!) 150/72 (BP Location: Left Arm)    Pulse 95    Temp 98.4 F (36.9 C) (Oral)    Resp 20    Ht 5\' 8"  (1.727 m)    Wt 81 kg    SpO2 97%    BMI 27.15 kg/m  Gen:   Awake, no distress   Resp:  Normal effort, decreased lung sounds on the right MSK:   Moves extremities without difficulty  Other:  Abdomen is soft and nontender.  Medical Decision Making  Medically screening exam initiated at 8:07 PM.  Appropriate orders placed.  Antonio Carlson was informed that the remainder of the evaluation will be completed by another provider, this initial triage assessment does not replace that evaluation, and the importance of remaining in the ED until their evaluation is complete.     Antonio Hillier, PA-C 08/01/21 2008

## 2021-08-01 NOTE — Telephone Encounter (Signed)
Discussed with Katie at [facility] and rec pt go to [facility] for admit but not able to reach him directly   [facility]

## 2021-08-01 NOTE — ED Triage Notes (Signed)
Pt arrives POV for eval of RUQ/RLQ abd pain w/ radiation to R flank. Pt reports onset of 12/25. States he went to UC today for further eval and was told he had RLL Pneumonia and needed to come here for abx

## 2021-08-01 NOTE — Telephone Encounter (Signed)
Called and spoke with patient. Patient stated he was taking Dupixent (only 3 doses), and felt he was having some side effects, and stopped mid December.  Patient stated he was having increased DOE and lower back pain.  Patient stated he spoke with allergy and was told to stop for 1 to 2 months, because Dupixent could be the cause. Patient stated DOE continues, but rest does help, so patient has not used albuterol rescue inhaler.  Patient is using nebs as instructed and feels that helps controlling sob.  Patient stated he has developed a productive cough at times, but does not know color of any sputum, because its a small amount and is swallowed. Patient's main concern is his back pain that started in lower back, is now radiated up to his entire right side, and around right abdominal region.  Patient stated he is concerned of possible pneumonia starting and causing right side lung pain.  Patient stated he wanted OV today, but none were available. Patient has not tried and OTC meds, but stated he had a few Tramadol that has helped some at night, but patient has only used it a few nights. Patient stated he would come in for cxr, if needed to rule out any chest issues.  Message routed to Dr. Melvyn Novas to advise  LOV 07/01/21 Tammy, NP  Instructions  Continue on Budesonide and Perforomist Neb Twice daily   Continue on Singulair daily  Albuterol inhaler as needed    Continue on follow up with Allergist as planned.    Low fat cholesterol diet  Activity as tolerated.    Labs today    Pneumovax vaccine today .    Follow up with Dr. Melvyn Novas  in 6 months and As needed

## 2021-08-01 NOTE — Telephone Encounter (Signed)
Called and spoke with Hassan Rowan [facility]) patient's wife.  Dr. Gustavus Bryant recommendations given.  Understanding stated.  Nothing further at this time.

## 2021-08-01 NOTE — Telephone Encounter (Signed)
Patient is returning phone call. Patient phone number is [phone] h and [phone] c.

## 2021-08-02 ENCOUNTER — Inpatient Hospital Stay (HOSPITAL_COMMUNITY): Payer: Medicare Other

## 2021-08-02 ENCOUNTER — Other Ambulatory Visit: Payer: Self-pay

## 2021-08-02 DIAGNOSIS — J45909 Unspecified asthma, uncomplicated: Secondary | ICD-10-CM | POA: Diagnosis present

## 2021-08-02 DIAGNOSIS — K219 Gastro-esophageal reflux disease without esophagitis: Secondary | ICD-10-CM | POA: Diagnosis present

## 2021-08-02 DIAGNOSIS — I10 Essential (primary) hypertension: Secondary | ICD-10-CM | POA: Diagnosis present

## 2021-08-02 DIAGNOSIS — J189 Pneumonia, unspecified organism: Secondary | ICD-10-CM | POA: Diagnosis present

## 2021-08-02 DIAGNOSIS — Z8249 Family history of ischemic heart disease and other diseases of the circulatory system: Secondary | ICD-10-CM | POA: Diagnosis not present

## 2021-08-02 DIAGNOSIS — Z8042 Family history of malignant neoplasm of prostate: Secondary | ICD-10-CM | POA: Diagnosis not present

## 2021-08-02 DIAGNOSIS — Z79899 Other long term (current) drug therapy: Secondary | ICD-10-CM | POA: Diagnosis not present

## 2021-08-02 DIAGNOSIS — R091 Pleurisy: Secondary | ICD-10-CM | POA: Diagnosis not present

## 2021-08-02 DIAGNOSIS — E785 Hyperlipidemia, unspecified: Secondary | ICD-10-CM | POA: Diagnosis present

## 2021-08-02 DIAGNOSIS — J918 Pleural effusion in other conditions classified elsewhere: Secondary | ICD-10-CM | POA: Diagnosis present

## 2021-08-02 DIAGNOSIS — Z7951 Long term (current) use of inhaled steroids: Secondary | ICD-10-CM | POA: Diagnosis not present

## 2021-08-02 DIAGNOSIS — Z951 Presence of aortocoronary bypass graft: Secondary | ICD-10-CM | POA: Diagnosis not present

## 2021-08-02 DIAGNOSIS — J9811 Atelectasis: Secondary | ICD-10-CM | POA: Diagnosis not present

## 2021-08-02 DIAGNOSIS — I2510 Atherosclerotic heart disease of native coronary artery without angina pectoris: Secondary | ICD-10-CM | POA: Diagnosis present

## 2021-08-02 DIAGNOSIS — J90 Pleural effusion, not elsewhere classified: Secondary | ICD-10-CM | POA: Diagnosis present

## 2021-08-02 DIAGNOSIS — Z20822 Contact with and (suspected) exposure to covid-19: Secondary | ICD-10-CM | POA: Diagnosis present

## 2021-08-02 DIAGNOSIS — Z808 Family history of malignant neoplasm of other organs or systems: Secondary | ICD-10-CM | POA: Diagnosis not present

## 2021-08-02 DIAGNOSIS — Z7902 Long term (current) use of antithrombotics/antiplatelets: Secondary | ICD-10-CM | POA: Diagnosis not present

## 2021-08-02 LAB — BODY FLUID CELL COUNT WITH DIFFERENTIAL
Eos, Fluid: 6 %
Lymphs, Fluid: 72 %
Monocyte-Macrophage-Serous Fluid: 3 % — ABNORMAL LOW (ref 50–90)
Neutrophil Count, Fluid: 19 % (ref 0–25)
Total Nucleated Cell Count, Fluid: 2054 cu mm — ABNORMAL HIGH (ref 0–1000)

## 2021-08-02 LAB — GRAM STAIN

## 2021-08-02 LAB — GLUCOSE, PLEURAL OR PERITONEAL FLUID: Glucose, Fluid: 84 mg/dL

## 2021-08-02 LAB — PROTEIN, PLEURAL OR PERITONEAL FLUID: Total protein, fluid: 3.9 g/dL

## 2021-08-02 MED ORDER — ACETAMINOPHEN 325 MG PO TABS: 650.0000 mg | ORAL_TABLET | Freq: Four times a day (QID) | ORAL | Status: DC | PRN

## 2021-08-02 MED ORDER — BUDESONIDE 0.25 MG/2ML IN SUSP
0.2500 mg | Freq: Two times a day (BID) | RESPIRATORY_TRACT | Status: DC
  Administered 2021-08-02 – 2021-08-04 (×5): 0.25 mg via RESPIRATORY_TRACT
  Filled 2021-08-02 (×5): qty 2

## 2021-08-02 MED ORDER — SODIUM CHLORIDE 0.9 % IV SOLN
1.0000 g | Freq: Once | INTRAVENOUS | Status: AC
  Administered 2021-08-02: 1 g via INTRAVENOUS
  Filled 2021-08-02: qty 10

## 2021-08-02 MED ORDER — ATORVASTATIN CALCIUM 80 MG PO TABS
80.0000 mg | ORAL_TABLET | Freq: Every day | ORAL | Status: DC
  Administered 2021-08-02 – 2021-08-04 (×3): 80 mg via ORAL
  Filled 2021-08-02: qty 1
  Filled 2021-08-02: qty 2
  Filled 2021-08-02: qty 1

## 2021-08-02 MED ORDER — SODIUM CHLORIDE 0.9 % IV SOLN
500.0000 mg | Freq: Once | INTRAVENOUS | Status: AC
  Administered 2021-08-02: 500 mg via INTRAVENOUS
  Filled 2021-08-02: qty 5

## 2021-08-02 MED ORDER — SODIUM CHLORIDE 0.9 % IV SOLN
500.0000 mg | Freq: Every day | INTRAVENOUS | Status: DC
  Administered 2021-08-03: 500 mg via INTRAVENOUS
  Filled 2021-08-02: qty 5

## 2021-08-02 MED ORDER — MONTELUKAST SODIUM 10 MG PO TABS
10.0000 mg | ORAL_TABLET | Freq: Every day | ORAL | Status: DC
  Administered 2021-08-02 – 2021-08-03 (×2): 10 mg via ORAL
  Filled 2021-08-02 (×2): qty 1

## 2021-08-02 MED ORDER — ARFORMOTEROL TARTRATE 15 MCG/2ML IN NEBU
15.0000 ug | INHALATION_SOLUTION | Freq: Two times a day (BID) | RESPIRATORY_TRACT | Status: DC
  Administered 2021-08-02 – 2021-08-04 (×5): 15 ug via RESPIRATORY_TRACT
  Filled 2021-08-02 (×6): qty 2

## 2021-08-02 MED ORDER — SODIUM CHLORIDE 0.9 % IV SOLN
1.0000 g | Freq: Every day | INTRAVENOUS | Status: DC
  Administered 2021-08-03 – 2021-08-04 (×2): 1 g via INTRAVENOUS
  Filled 2021-08-02 (×2): qty 10

## 2021-08-02 MED ORDER — POLYETHYLENE GLYCOL 3350 17 G PO PACK: 17.0000 g | PACK | Freq: Every day | ORAL | Status: DC | PRN

## 2021-08-02 MED ORDER — ENOXAPARIN SODIUM 40 MG/0.4ML IJ SOSY
40.0000 mg | PREFILLED_SYRINGE | INTRAMUSCULAR | Status: DC
  Administered 2021-08-02 – 2021-08-03 (×2): 40 mg via SUBCUTANEOUS
  Filled 2021-08-02 (×2): qty 0.4

## 2021-08-02 MED ORDER — CLOPIDOGREL BISULFATE 75 MG PO TABS
75.0000 mg | ORAL_TABLET | Freq: Every day | ORAL | Status: DC
  Administered 2021-08-02 – 2021-08-04 (×3): 75 mg via ORAL
  Filled 2021-08-02 (×3): qty 1

## 2021-08-02 MED ORDER — LIDOCAINE HCL (PF) 1 % IJ SOLN
INTRAMUSCULAR | Status: AC
  Filled 2021-08-02: qty 30

## 2021-08-02 MED ORDER — METOPROLOL TARTRATE 25 MG PO TABS
25.0000 mg | ORAL_TABLET | Freq: Two times a day (BID) | ORAL | Status: DC
  Administered 2021-08-02 – 2021-08-04 (×5): 25 mg via ORAL
  Filled 2021-08-02 (×5): qty 1

## 2021-08-02 MED ORDER — ALBUTEROL SULFATE (2.5 MG/3ML) 0.083% IN NEBU: 2.5000 mg | INHALATION_SOLUTION | RESPIRATORY_TRACT | Status: DC | PRN

## 2021-08-02 MED ORDER — ACETAMINOPHEN 650 MG RE SUPP: 650.0000 mg | Freq: Four times a day (QID) | RECTAL | Status: DC | PRN

## 2021-08-02 MED ORDER — IRBESARTAN 300 MG PO TABS
300.0000 mg | ORAL_TABLET | Freq: Every day | ORAL | Status: DC
  Administered 2021-08-02 – 2021-08-04 (×3): 300 mg via ORAL
  Filled 2021-08-02 (×3): qty 1

## 2021-08-02 NOTE — Plan of Care (Signed)
Pt admitted to unit to room 07. Pt alert and oriented. Denies pain. Telemetry applied. Chg bath given. Vitals stable no distress noted.

## 2021-08-02 NOTE — ED Provider Notes (Addendum)
[facility] EMERGENCY DEPARTMENT Provider Note   CSN: [id number] Arrival date & time: 08/01/21  1820     History  Chief Complaint  Patient presents with   Back Pain   Abdominal Pain    Antonio Carlson is a 76 y.o. male.   Back Pain Associated symptoms: abdominal pain   Abdominal Pain  76 year old male with a history of PVCs, asthma, BPH, diverticulosis, nephrolithiasis, HLD, HTN, GERD, CAD who presents to the emergency department with a chief complaint of dyspnea.  The patient states that he has had shortness of breath that has been progressively worsening since around Christmas.  He followed outpatient with his pulmonologist and was advised to present to urgent care where he had imaging concerning for a right-sided pleural effusion.  His pulmonologist recommended that he present to the emergency department given his significant symptoms for consideration for admission for drainage of his pleural effusion and symptomatic treatment of pneumonia.  Does endorse a cough that is mildly productive.  He denies any fevers or chills.  He denies any active chest pain.  He does endorse pleuritic chest discomfort on the right when coughing and significant abdominal discomfort associated with his cough.  He denies any abdominal pain at this time at rest.  Home Medications Prior to Admission medications   Medication Sig Start Date End Date Taking? Authorizing Provider  acetaminophen (TYLENOL) 500 MG tablet Take 500-1,000 mg by mouth every 6 (six) hours as needed for moderate pain or headache.   Yes [provider]  albuterol (VENTOLIN HFA) 108 (90 Base) MCG/ACT inhaler Inhale 2 puffs into the lungs every 6 (six) hours as needed for wheezing or shortness of breath.   Yes [provider]  budesonide (PULMICORT) 0.25 MG/2ML nebulizer solution USE 1 VIAL IN NEBULIZER TWICE DAILY Patient taking differently: Take 0.25 mg by nebulization in the morning and at bedtime.  12/30/20  Yes [name], MD  clopidogrel (PLAVIX) 75 MG tablet TAKE 1 TABLET BY MOUTH EVERY DAY Patient taking differently: Take 75 mg by mouth daily. 05/22/21  Yes Hilty, Nadean Corwin, MD  formoterol (PERFOROMIST) 20 MCG/2ML nebulizer solution Take 2 mLs (20 mcg total) by nebulization 2 (two) times daily. 04/13/21  Yes [name], MD  metoprolol tartrate (LOPRESSOR) 50 MG tablet TAKE 1/2 TABLET BY MOUTH TWICE A DAY Patient taking differently: Take 25 mg by mouth 2 (two) times daily. 04/10/21  Yes [name], MD  montelukast (SINGULAIR) 10 MG tablet Take 1 tablet (10 mg total) by mouth at bedtime. 11/27/20  Yes [name], MD  Multiple Vitamins-Minerals (CENTRUM) tablet Take 1 tablet by mouth daily.   Yes [provider]  omeprazole (PRILOSEC) 20 MG capsule Take 1 capsule (20 mg total) by mouth daily. 05/28/21 08/26/21 Yes [name], MD  telmisartan (MICARDIS) 80 MG tablet Take 1 tablet (80 mg total) by mouth daily. 05/29/21  Yes [name], MD  atorvastatin (LIPITOR) 80 MG tablet Take 1 tablet (80 mg total) by mouth daily. TAKE 1 TABLET BY MOUTH EVERY DAY AT 6PM Patient taking differently: Take 80 mg by mouth every evening. 03/17/21   Hilty, Nadean Corwin, MD  CALCIUM CITRATE-VITAMIN D PO Chew 1 tablet by mouth daily.    [provider]  rosuvastatin (CRESTOR) 20 MG tablet Take 1 tablet (20 mg total) by mouth at bedtime. 08/07/11 10/01/11  [name], MD      Allergies    Aspirin, Amoxicillin, and Sulfonamide  derivatives    Review of Systems   Review of Systems  Gastrointestinal:  Positive for abdominal pain.  Musculoskeletal:  Positive for back pain.   Physical Exam Updated Vital Signs BP 126/79    Pulse 95    Temp 97.8 F (36.6 C) (Oral)    Resp 20    Ht 5\' 8"  (1.727 m)    Wt 81 kg    SpO2 94%    BMI 27.15 kg/m  Physical Exam Vitals and nursing note reviewed.  Constitutional:      General: He is not in acute distress. HENT:     Head:  Normocephalic and atraumatic.  Eyes:     Conjunctiva/sclera: Conjunctivae normal.     Pupils: Pupils are equal, round, and reactive to light.  Cardiovascular:     Rate and Rhythm: Normal rate and regular rhythm.  Pulmonary:     Effort: Pulmonary effort is normal. No respiratory distress.     Breath sounds: Examination of the right-lower field reveals decreased breath sounds. Decreased breath sounds present.  Abdominal:     General: There is no distension.     Tenderness: There is no guarding.  Musculoskeletal:        General: No deformity or signs of injury.     Cervical back: Neck supple.  Skin:    Findings: No lesion or rash.  Neurological:     General: No focal deficit present.     Mental Status: He is alert. Mental status is at baseline.    ED Results / Procedures / Treatments   Labs (all labs ordered are listed, but only abnormal results are displayed) Labs Reviewed  BASIC METABOLIC PANEL - Abnormal; Notable for the following components:      Result Value   Glucose, Bld 130 (*)    Calcium 8.8 (*)    All other components within normal limits  CBC WITH DIFFERENTIAL/PLATELET - Abnormal; Notable for the following components:   WBC 13.7 (*)    Platelets 463 (*)    Neutro Abs 8.4 (*)    Eosinophils Absolute 2.7 (*)    All other components within normal limits  BODY FLUID CELL COUNT WITH DIFFERENTIAL - Abnormal; Notable for the following components:   Color, Fluid AMBER (*)    Appearance, Fluid HAZY (*)    Total Nucleated Cell Count, Fluid 2,054 (*)    Monocyte-Macrophage-Serous Fluid 3 (*)    All other components within normal limits  RESP PANEL BY RT-PCR (FLU A&B, COVID) ARPGX2  CULTURE, BODY FLUID W GRAM STAIN -BOTTLE  GRAM STAIN  PROTEIN, PLEURAL OR PERITONEAL FLUID  GLUCOSE, PLEURAL OR PERITONEAL FLUID  PATHOLOGIST SMEAR REVIEW  PATHOLOGIST SMEAR REVIEW  TROPONIN I (HIGH SENSITIVITY)  TROPONIN I (HIGH SENSITIVITY)    EKG EKG  Interpretation  Date/Time:  Friday August 01 2021 20:05:37 EST Ventricular Rate:  96 PR Interval:  142 QRS Duration: 82 QT Interval:  360 QTC Calculation: 454 R Axis:   34 Text Interpretation: Normal sinus rhythm Nonspecific ST abnormality Abnormal ECG When compared with ECG of 04-May-2019 08:26, PREVIOUS ECG IS PRESENT Confirmed by Regan Lemming ([id number]) on 08/02/2021 5:30:18 AM  Radiology DG Chest 1 View  Result Date: 08/02/2021 CLINICAL DATA:  Status post right thoracentesis.  History of asthma. EXAM: CHEST  1 VIEW COMPARISON:  Chest radiograph dated August 01, 2021 FINDINGS: Interval decrease in the right pleural effusion. Right basilar atelectasis with small right pleural effusion. Left basilar atelectasis is unchanged. No appreciable pneumothorax. Sternotomy wires  are intact.  No acute osseous abnormality. IMPRESSION: Status post right thoracentesis with interval decrease in size of the right pleural effusion. No appreciable pneumothorax. Electronically Signed   By: Keane Police D.O.   On: 08/02/2021 11:30   DG Chest 2 View  Result Date: 08/01/2021 CLINICAL DATA:  Right lower lobe effusion. EXAM: CHEST - 2 VIEW COMPARISON:  Chest radiograph dated 07/01/2021. FINDINGS: There is a small right pleural effusion with right lung base atelectasis or infiltrate. Trace left pleural effusion versus scarring. No pneumothorax. The cardiac silhouette is within normal limits. Median sternotomy wires and CABG vascular clips. Atherosclerotic calcification of the aorta. Degenerative changes of the spine. No acute osseous pathology. IMPRESSION: Small right pleural effusion with right lung base atelectasis or infiltrate. Electronically Signed   By: Anner Crete M.D.   On: 08/01/2021 20:52   US THORACENTESIS ASP PLEURAL SPACE W/IMG GUIDE  Result Date: 08/02/2021 INDICATION: Shortness of breath, right pleural effusion is seen on previous chest x-ray. Request for therapeutic and diagnostic thoracentesis. EXAM:  ULTRASOUND GUIDED RIGHT THORACENTESIS MEDICATIONS: 10 mL 1% lidocaine COMPLICATIONS: None immediate. PROCEDURE: An ultrasound guided thoracentesis was thoroughly discussed with the patient and questions answered. The benefits, risks, alternatives and complications were also discussed. The patient understands and wishes to proceed with the procedure. Written consent was obtained. Ultrasound was performed to localize and mark an adequate pocket of fluid in the right chest. The area was then prepped and draped in the normal sterile fashion. 1% Lidocaine was used for local anesthesia. Under ultrasound guidance a 6 Fr Safe-T-Centesis catheter was introduced. Thoracentesis was performed. The catheter was removed and a dressing applied. FINDINGS: A total of approximately 950 cc of hazy amber fluid was removed. Samples were sent to the laboratory as requested by the clinical team. Post procedure chest X-ray reviewed, negative for pneumothorax. IMPRESSION: Successful ultrasound guided right thoracentesis yielding 950 cc of pleural fluid. Read by: Durenda Guthrie, PA-C Electronically Signed   By: Jerilynn Mages.  Shick M.D.   On: 08/02/2021 11:49    Procedures Procedures    Medications Ordered in ED Medications  atorvastatin (LIPITOR) tablet 80 mg (80 mg Oral Given 08/02/21 0938)  metoprolol tartrate (LOPRESSOR) tablet 25 mg (25 mg Oral Given 08/02/21 0938)  irbesartan (AVAPRO) tablet 300 mg (300 mg Oral Given 08/02/21 0938)  budesonide (PULMICORT) nebulizer solution 0.25 mg (0.25 mg Nebulization Given 08/02/21 1015)  arformoterol (BROVANA) nebulizer solution 15 mcg (15 mcg Nebulization Given 08/02/21 1337)  montelukast (SINGULAIR) tablet 10 mg (has no administration in time range)  albuterol (PROVENTIL) (2.5 MG/3ML) 0.083% nebulizer solution 2.5 mg (has no administration in time range)  acetaminophen (TYLENOL) tablet 650 mg (has no administration in time range)    Or  acetaminophen (TYLENOL) suppository 650 mg (has no administration in  time range)  polyethylene glycol (MIRALAX / GLYCOLAX) packet 17 g (has no administration in time range)  cefTRIAXone (ROCEPHIN) 1 g in sodium chloride 0.9 % 100 mL IVPB (has no administration in time range)  azithromycin (ZITHROMAX) 500 mg in sodium chloride 0.9 % 250 mL IVPB (has no administration in time range)  clopidogrel (PLAVIX) tablet 75 mg (75 mg Oral Given 08/02/21 1334)  enoxaparin (LOVENOX) injection 40 mg (40 mg Subcutaneous Given 08/02/21 1335)  cefTRIAXone (ROCEPHIN) 1 g in sodium chloride 0.9 % 100 mL IVPB (0 g Intravenous Stopped 08/02/21 0627)  azithromycin (ZITHROMAX) 500 mg in sodium chloride 0.9 % 250 mL IVPB (0 mg Intravenous Stopped 08/02/21 0833)  lidocaine (PF) (XYLOCAINE) 1 %  injection (  Given 08/02/21 1050)    ED Course/ Medical Decision Making/ A&P                           Medical Decision Making   76 year old male with a history of PVCs, asthma, BPH, diverticulosis, nephrolithiasis, HLD, HTN, GERD, CAD who presents to the emergency department with a chief complaint of dyspnea.  The patient states that he has had shortness of breath that has been progressively worsening since around Christmas.  He followed outpatient with his pulmonologist and was advised to present to urgent care where he had imaging concerning for a right-sided pleural effusion.  His pulmonologist recommended that he present to the emergency department given his significant symptoms for consideration for admission for drainage of his pleural effusion and symptomatic treatment of pneumonia.  Does endorse a cough that is mildly productive.  He denies any fevers or chills.  He denies any active chest pain.  He does endorse pleuritic chest discomfort on the right when coughing and significant abdominal discomfort associated with his cough.  He denies any abdominal pain at this time at rest.  On arrival, the patient was afebrile, hemodynamically stable, mildly hypertensive BP 150/72, not tachycardic or tachypneic,  saturating 97% on room air.  Cardiac telemetry was reviewed and revealed normal sinus rhythm.  Laboratory work-up reviewed significant for leukocytosis to 13.7, BMP generally unremarkable, mild reactive thrombocytosis to 463, COVID-19 and influenza PCR negative, troponin normal at 10.   Chest x-ray was performed and reviewed by myself and radiology significant for a right-sided pleural effusion with developing patchy opacities in the right lung base consistent with possible developing pneumonia.  Given the patient's significant symptoms of tachypnea, worsening shortness of breath, concern for developing pneumonia, he was covered for community-acquired pneumonia with azithromycin and Rocephin.  Interventional radiology was consulted for thoracentesis.  While waiting interventional radiology consult, will plan for admission for treatment of community-acquired pneumonia.  Internal medicine consulted for admission.    Final Clinical Impression(s) / ED Diagnoses Final diagnoses:  Pleural effusion  Community acquired pneumonia of right lower lobe of lung    Rx / DC Orders ED Discharge Orders     None         Regan Lemming, MD 08/02/21 1649    Regan Lemming, MD 08/02/21 1650

## 2021-08-02 NOTE — Procedures (Signed)
PROCEDURE SUMMARY:  Successful image-guided right thoracentesis. Yielded 950 cc of hazy amber fluid. Pt tolerated procedure well. No immediate complications. EBL = trace   Specimen was sent for labs. CXR ordered.  Please see imaging section of Epic for full dictation.  Armando Gang Kayela Humphres PA-C 08/02/2021 11:18 AM

## 2021-08-02 NOTE — H&P (Signed)
Date: 08/02/2021               Patient Name:  Antonio Carlson MRN: [id number]  DOB: 03/03/1946 Age / Sex: 76 y.o., male   PCP: [name], MD         Medical Service: Internal Medicine Teaching Service         Attending Physician: Dr. Jimmye Norman, Elaina Pattee, MD    First Contact: Dr. Lorin Glass Pager: [phone]  Second Contact: Dr. Alfonse Spruce Pager: [phone]       After Hours (After 5p/  First Contact Pager: [phone]  weekends / holidays): Second Contact Pager: [phone]   Chief Complaint: Abdominal pain  History of Present Illness: Antonio Carlson is a 76 y.o. male with a PMHx of PVCs, asthma, BPH, diverticulosis, nephrolithiasis, HLD, HTN, GERD, CAD who presents to the ED today with a CC of dyspnea and abdominal pain.  The patient states that his symptoms happened right around Christmas. His SOB and a cough had progressively worsened until yesterday when he felt slightly better. Yesterday, he noticed some RLQ abd pain and right-sided back pain with deep breaths. That night, he also noticed pain radiating to his shoulder. This prompted him to present to urgent care, at which time he got a CXR which showed a moderate right pleural effusion. Had been told he also had a PNA. His pulmonologist Dr. Melvyn Novas at [facility] was unable to take the patient there, so he was transferred here.   He describes the cough as clear, no blood in sputum. The SOB occurs with ambulation but he denies chest pain at any time. No recent immobilization. No exposure to TB. No fever, night sweats or wt loss. No tearing sensation in the chest. No urinary and BM issue. No N/V. No LE edema now. No orthopnea.   States that he has been on dupixent but read online that it can cause similar symptoms to what he is experiencing now. He called his PCP who advised that he stop this medication until further work-up was performed.   Meds:  Current Meds  Medication Sig   acetaminophen (TYLENOL) 500 MG tablet Take 500-1,000 mg by mouth every 6  (six) hours as needed for moderate pain or headache.   albuterol (VENTOLIN HFA) 108 (90 Base) MCG/ACT inhaler Inhale 2 puffs into the lungs every 6 (six) hours as needed for wheezing or shortness of breath.   budesonide (PULMICORT) 0.25 MG/2ML nebulizer solution USE 1 VIAL IN NEBULIZER TWICE DAILY (Patient taking differently: Take 0.25 mg by nebulization in the morning and at bedtime.)   clopidogrel (PLAVIX) 75 MG tablet TAKE 1 TABLET BY MOUTH EVERY DAY (Patient taking differently: Take 75 mg by mouth daily.)   formoterol (PERFOROMIST) 20 MCG/2ML nebulizer solution Take 2 mLs (20 mcg total) by nebulization 2 (two) times daily.   metoprolol tartrate (LOPRESSOR) 50 MG tablet TAKE 1/2 TABLET BY MOUTH TWICE A DAY (Patient taking differently: Take 25 mg by mouth 2 (two) times daily.)   montelukast (SINGULAIR) 10 MG tablet Take 1 tablet (10 mg total) by mouth at bedtime.   Multiple Vitamins-Minerals (CENTRUM) tablet Take 1 tablet by mouth daily.   omeprazole (PRILOSEC) 20 MG capsule Take 1 capsule (20 mg total) by mouth daily.   telmisartan (MICARDIS) 80 MG tablet Take 1 tablet (80 mg total) by mouth daily.    Allergies: Allergies as of 08/01/2021 - Review Complete 08/01/2021  Allergen Reaction Noted   Aspirin Shortness Of Breath  Amoxicillin  12/01/2007   Sulfonamide derivatives Other (See Comments)    Past Medical History:  Diagnosis Date   Allergy    Anginal pain ([place])    Arthritis    Asthma    Benign prostatic hypertrophy    Coronary artery disease    Diverticulosis    Dyspnea    GERD (gastroesophageal reflux disease)    History of kidney stones    Hyperlipidemia    Hypertension    Nephrolithiasis    PVC's (premature ventricular contractions)    Syncope   S/p 4 vessels CABG in 2020  Family History:  Half-brother: lung and prostate CA Other half-brother: brain CA Both sides of family: heart attack and heart failure  Social History: Lives [place] farm in [place]. Does not  have farm animals but owns several beagles.  -Have 3 sons who live nearby -Smoked for 14 years until 58. 2ppd -Occasional beer or mixed drink, about 1-2 times a week  Review of Systems: A complete ROS was negative except as per HPI.   Physical Exam: Blood pressure (!) 155/89, pulse 97, temperature 97.8 F (36.6 C), temperature source Oral, resp. rate 19, height 5\' 8"  (1.727 m), weight 81 kg, SpO2 95 %. Physical Exam Constitutional:      General: He is not in acute distress.    Appearance: Normal appearance.  HENT:     Head: Normocephalic and atraumatic.     Right Ear: Tympanic membrane normal.     Left Ear: Tympanic membrane normal.     Nose: Nose normal. No congestion or rhinorrhea.     Mouth/Throat:     Pharynx: Oropharynx is clear.  Eyes:     Extraocular Movements: Extraocular movements intact.     Pupils: Pupils are equal, round, and reactive to light.  Cardiovascular:     Rate and Rhythm: Normal rate and regular rhythm.     Pulses: Normal pulses.     Heart sounds: No murmur heard.   No friction rub. No gallop.  Pulmonary:     Effort: Pulmonary effort is normal.     Breath sounds: No wheezing, rhonchi or rales.     Comments: Subtle dullness to percussion of RLL field Abdominal:     General: Abdomen is flat. Bowel sounds are normal. There is no distension.     Palpations: Abdomen is soft.     Tenderness: There is no abdominal tenderness.  Musculoskeletal:        General: No swelling, tenderness or deformity. Normal range of motion.     Right lower leg: No edema.     Left lower leg: No edema.  Skin:    General: Skin is warm and dry.  Neurological:     General: No focal deficit present.     Mental Status: He is alert and oriented to person, place, and time.  Psychiatric:        Mood and Affect: Mood normal.        Behavior: Behavior normal.    EKG: personally reviewed my interpretation is normal sinus rhythm  CXR: personally reviewed my interpretation is a  small right pleural effusion with right lung base atelectasis or infiltrate.   Assessment & Plan by Problem: Principal Problem:   CAP (community acquired pneumonia)  #CAP #Right-sided pleural effusion, s/p thoracentesis this AM The patient presents here with progressively worsening pleuritic chest pain/RLQ abdominal pain. Was seen in UC yesterday with CXR showing a small to moderate right pleural effusion, also with concern for  RLQ. Pulmonologist advised that he come here. In the ED, patient was afebrile, tachypneic but satting in the high 90s on RA. Labs are remarkable for WBC 13.7, platelets 463, normal EKG, trop flat, negative resp panel. The patient was placed on ceftriaxone and azithromycin for presumed CAP, and IR was consulted for thoracentesis. On our exam, he was satting at 95% on RA, and there was subtle dullness to percussion with decreased breath sounds at RLL base.  In terms of his chest/abdominal/back pain, differential includes parapneumonic effusion vs malignancy. The pain is not associated with eating making a biliary process less likely, and he does not have any urinary symptoms or CVA tenderness on exam, making nephrolithiasis less likely. Low suspicion for PE or aortic dissection given imaging findings, low suspicion for ACS with nml troponins and EKG. -Thoracentesis was performed this AM without complication, body fluid culture, cell count, protein, and glucose levels were obtained and are currently pending -Continue IV ceftriaxone and azithromycin  #H/o asthma Patient has a history of asthma for which he is on nebulizer tx twice a day. He also has a rescuer inhaler which he has not used for the past 1.5 years. He has been holding his Dupixent at home due to his current symptoms. -Albuterol q4h PRN -Brovana and pulmicort q12h -Singulair 25 mg BID  #HTN Patient is on irbesartan and metoprolol at home.  Blood pressure has been ranging in the 568L-275 systolic while in the  ED. -Resume home irbesartan 300 mg p.o. daily -Resume metoprolol 25 mg p.o. twice daily  #CAD, s/p 4-vessel CABG in 2020 Resumed home plavix after thoracentesis today.  #HLD Patient is on atorvastatin 80 mg daily at home. Most recent lipid panel shows total cholesterol 159, LDL 99, HDL 44. -Continue atorvastatin 80 mg daily  Dispo: Admit patient to Inpatient with expected length of stay greater than 2 midnights.  Signed: Orvis Brill, MD 08/02/2021, 8:59 AM  Pager: [phone]  After 5pm on weekdays and 1pm on weekends: On Call pager: [phone]

## 2021-08-02 NOTE — ED Notes (Signed)
Patient transported to IR 

## 2021-08-03 DIAGNOSIS — J90 Pleural effusion, not elsewhere classified: Secondary | ICD-10-CM | POA: Diagnosis not present

## 2021-08-03 DIAGNOSIS — J189 Pneumonia, unspecified organism: Principal | ICD-10-CM

## 2021-08-03 LAB — COMPREHENSIVE METABOLIC PANEL
ALT: 36 U/L (ref 0–44)
AST: 32 U/L (ref 15–41)
Albumin: 2.5 g/dL — ABNORMAL LOW (ref 3.5–5.0)
Alkaline Phosphatase: 180 U/L — ABNORMAL HIGH (ref 38–126)
Anion gap: 11 (ref 5–15)
BUN: 12 mg/dL (ref 8–23)
CO2: 24 mmol/L (ref 22–32)
Calcium: 8.6 mg/dL — ABNORMAL LOW (ref 8.9–10.3)
Chloride: 102 mmol/L (ref 98–111)
Creatinine, Ser: 0.91 mg/dL (ref 0.61–1.24)
GFR, Estimated: >60 mL/min (ref >60)
Glucose, Bld: 99 mg/dL (ref 70–99)
Potassium: 3.8 mmol/L (ref 3.5–5.1)
Sodium: 137 mmol/L (ref 135–145)
Total Bilirubin: 0.6 mg/dL (ref 0.3–1.2)
Total Protein: 5.9 g/dL — ABNORMAL LOW (ref 6.5–8.1)

## 2021-08-03 LAB — CBC
HCT: 40.5 % (ref 39.0–52.0)
Hemoglobin: 13.3 g/dL (ref 13.0–17.0)
MCH: 26.8 pg (ref 26.0–34.0)
MCHC: 32.8 g/dL (ref 30.0–36.0)
MCV: 81.5 fL (ref 80.0–100.0)
Platelets: 398 10*3/uL (ref 150–400)
RBC: 4.97 MIL/uL (ref 4.22–5.81)
RDW: 14.7 % (ref 11.5–15.5)
WBC: 14.7 10*3/uL — ABNORMAL HIGH (ref 4.0–10.5)
nRBC: 0 % (ref 0.0–0.2)

## 2021-08-03 LAB — BASIC METABOLIC PANEL
Anion gap: 10 (ref 5–15)
BUN: 14 mg/dL (ref 8–23)
CO2: 25 mmol/L (ref 22–32)
Calcium: 8.4 mg/dL — ABNORMAL LOW (ref 8.9–10.3)
Chloride: 99 mmol/L (ref 98–111)
Creatinine, Ser: 0.95 mg/dL (ref 0.61–1.24)
GFR, Estimated: >60 mL/min (ref >60)
Glucose, Bld: 94 mg/dL (ref 70–99)
Potassium: 3.9 mmol/L (ref 3.5–5.1)
Sodium: 134 mmol/L — ABNORMAL LOW (ref 135–145)

## 2021-08-03 LAB — GLUCOSE, CAPILLARY: Glucose-Capillary: 103 mg/dL — ABNORMAL HIGH (ref 70–99)

## 2021-08-03 LAB — MAGNESIUM: Magnesium: 1.9 mg/dL (ref 1.7–2.4)

## 2021-08-03 MED ORDER — AZITHROMYCIN 500 MG PO TABS: 500.0000 mg | ORAL_TABLET | Freq: Every day | ORAL | Status: DC

## 2021-08-03 MED ORDER — METRONIDAZOLE 500 MG/100ML IV SOLN
500.0000 mg | Freq: Three times a day (TID) | INTRAVENOUS | Status: DC
  Administered 2021-08-03 – 2021-08-04 (×3): 500 mg via INTRAVENOUS
  Filled 2021-08-03 (×3): qty 100

## 2021-08-03 NOTE — Progress Notes (Signed)
Subjective:  Pt seen at bedside during rounds this AM. He feels better today. Back pain is better, even with walking, after the thoracentesis. Coughing also improved. Breathing at baseline. Endorses sweating last night. Denies chills or tremors or discomfort. No significant wt loss. No other complaints or concerns at this time.  Objective:  Vital signs in last 24 hours: Vitals:   08/02/21 2109 08/02/21 2326 08/03/21 0335 08/03/21 0500  BP:  (!) 143/78 129/77   Pulse:  86 85   Resp:  (!) 21 20   Temp:  98.3 F (36.8 C) 98.1 F (36.7 C)   TempSrc:  Oral Oral   SpO2: 95% 95% 95%   Weight:    77.2 kg  Height:        Constitutional:      General: He is not in acute distress.    Appearance: Normal appearance.  Eyes:     Extraocular Movements: Extraocular movements intact.     Pupils: Pupils are equal, round, and reactive to light.  Cardiovascular:     Rate and Rhythm: Normal rate and regular rhythm.     Pulses: Normal pulses.     Heart sounds: No murmur heard.   No friction rub. No gallop.  Pulmonary:     Effort: Pulmonary effort is normal.     Breath sounds: No wheezing or rhonchi. Rales in RLL field laterally.    Comments: Dullness to percussion of RLL field. Otherwise normal. Abdominal:     General: Abdomen is flat. Bowel sounds are normal. There is no distension.     Palpations: Abdomen is soft.     Tenderness: There is no abdominal tenderness.  Musculoskeletal:        General: No swelling, tenderness or deformity. Normal range of motion.     Right lower leg: No edema.     Left lower leg: No edema.  Skin:    General: Skin is warm and dry.  Neurological:     General: No focal deficit present.     Mental Status: He is alert and oriented to person, place, and time.  Psychiatric:        Mood and Affect: Mood normal.        Behavior: Behavior normal.     Assessment/Plan:  Principal Problem:   CAP (community acquired pneumonia)  #Right parapneumonic  effusion Admitted for pneumonia and right-sided pleural effusion on recent imaging.  He is status post thoracentesis by IR on 1/7.  Almost 1 L of hazy, amber-colored fluid was drained.  Pleural protein of 3.9.  Total serum protein of 5.9. Ratio of 0.66, meeting Lights criteria for exudative effusion.  Body fluid culture so far shows no growth.  The patient has subjectively improved, and his pulm exam is improved but continues to demonstrate rales in RLL field. Will switch from ceftriaxone + azithromycin to ceftriaxone and metronidazole for tx of an uncomplicated effusion. -Continue IV ceftriaxone and metronidazole -Fluid cytology pending   #H/o asthma Patient has a history of asthma for which he is on nebulizer tx twice a day. He also has a rescuer inhaler which he has not used for the past 1.5 years. He has been holding his Dupixent at home due to his current symptoms. -Albuterol q4h PRN -Brovana and pulmicort q12h -Singulair 25 mg BID   #HTN Patient is on irbesartan and metoprolol at home. Blood pressure has been mostly normotensive in the past 24 hours. -Continue home irbesartan 300 mg p.o. daily -Continue metoprolol 25 mg  p.o. twice daily   #CAD, s/p 4-vessel CABG in 2020 -Continue home plavix   #HLD Patient is on atorvastatin 80 mg daily at home. Most recent lipid panel shows total cholesterol 159, LDL 99, HDL 44. -Continue atorvastatin 80 mg daily  Prior to Admission Living Arrangement: Home Anticipated Discharge Location: Home Barriers to Discharge: Medical stability Dispo: Anticipated discharge in approximately 1-2 day(s).   Orvis Brill, MD 08/03/2021, 6:42 AM Pager: [phone]  After 5pm on weekdays and 1pm on weekends: On Call pager [phone]

## 2021-08-03 NOTE — TOC Initial Note (Signed)
Transition of Care [facility]) - Initial/Assessment Note    Patient Details  Name: Antonio Carlson MRN: [id number] Date of Birth: 05/23/1946  Transition of Care [facility]) CM/SW Contact:    Carles Collet, RN Phone Number: 08/03/2021, 3:41 PM  Clinical Narrative:          Admitted from home with CAP. Sons live nearby. Independent PTA. Current treatment with IV ABx, RA documented. TOC will continue to follow.  No needs seen at this time          Expected Discharge Plan: Home/Self Care Barriers to Discharge: Continued Medical Work up   Patient Goals and CMS Choice        Expected Discharge Plan and Services Expected Discharge Plan: Home/Self Care   Discharge Planning Services: CM Consult                                          Prior Living Arrangements/Services                       Activities of Daily Living Home Assistive Devices/Equipment: Eyeglasses ADL Screening (condition at time of admission) Patient's cognitive ability adequate to safely complete daily activities?: Yes Is the patient deaf or have difficulty hearing?: No Does the patient have difficulty seeing, even when wearing glasses/contacts?: No Does the patient have difficulty concentrating, remembering, or making decisions?: No Patient able to express need for assistance with ADLs?: Yes Does the patient have difficulty dressing or bathing?: No Independently performs ADLs?: Yes (appropriate for developmental age) Does the patient have difficulty walking or climbing stairs?: Yes Weakness of Legs: None Weakness of Arms/Hands: None  Permission Sought/Granted                  Emotional Assessment              Admission diagnosis:  Pleural effusion [J90] CAP (community acquired pneumonia) [J18.9] Pleural effusion on right [J90] Community acquired pneumonia of right lower lobe of lung [J18.9] Patient Active Problem List   Diagnosis Date Noted   CAP (community acquired pneumonia)  08/02/2021   ? Reaction to Dupixent 07/29/2021   Chronic rhinitis 03/17/2021   Heartburn 03/17/2021   Aspirin-exacerbated respiratory disease (AERD) 03/17/2021   Coronary artery disease 05/01/2019   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   CAD, multiple vessel    Angina pectoris syndrome ([place]) 12/15/2018   Nasal polyposis 03/17/2016   Upper airway cough syndrome vs cough variant component  02/18/2015   Cervical neck pain with evidence of disc disease = C8 nerve root distribution 06/11/2014   Syncope 05/31/2014   Mechanical low back pain 02/01/2014   Hip pain, left 02/05/2012   URI (upper respiratory infection) 10/01/2011   Essential hypertension 09/15/2010   RECTAL BLEEDING 09/15/2010   Blood in stool 09/11/2010   OLECRANON BURSITIS, LEFT 12/01/2007   OSTEOPENIA 10/10/2007   COLONIC POLYPS 07/08/2007   Hyperlipidemia LDL goal <130 07/08/2007   Allergic rhinitis 07/08/2007   [place] asthma 07/08/2007   Diverticulosis of colon 07/08/2007   NEPHROLITHIASIS 07/08/2007   BENIGN PROSTATIC HYPERTROPHY, HX OF 07/08/2007   PCP:  [name], MD Pharmacy:   [id number] [place], [place] - [address] [place] [address] Phone: [phone] Fax: [place] #[id number] [place], [place] [address] AT [facility] [address] [place] [address] Phone: [phone] Fax: [phone]  [place], [place] [address] Phone: [phone] Fax: [phone]     Social Determinants of Health (SDOH) Interventions    Readmission Risk Interventions Readmission Risk Prevention Plan 05/09/2019  Transportation Screening Complete  PCP or Specialist Appt within 5-7 Days Complete  Home Care Screening Complete  Medication Review (RN CM) Complete  Some recent data might be hidden

## 2021-08-03 NOTE — Progress Notes (Signed)
PHARMACIST - PHYSICIAN COMMUNICATION DR:   Jimmye Norman CONCERNING: Antibiotic IV to Oral Route Change Policy  RECOMMENDATION: This patient is receiving azithromycin by the intravenous route.  Based on criteria approved by the Pharmacy and Therapeutics Committee, the antibiotic(s) is/are being converted to the equivalent oral dose form(s).   DESCRIPTION: These criteria include: Patient being treated for a respiratory tract infection, urinary tract infection, cellulitis or clostridium difficile associated diarrhea if on metronidazole The patient is not neutropenic and does not exhibit a GI malabsorption state The patient is eating (either orally or via tube) and/or has been taking other orally administered medications for a least 24 hours The patient is improving clinically and has a Tmax < 100.5  If you have questions about this conversion, please contact the Pharmacy Department  []   [phone] )  [facility] []   [phone] )  [facility] [x]   [phone] )  [facility] []   [phone] )  [facility] []   [phone] )  [place], PharmD Pharmacy Resident 08/03/2021, 9:12 AM

## 2021-08-03 NOTE — Plan of Care (Signed)
Pt rested during overnight. No tele events. Denies pain. Vitals stable. No prns given  Problem: Activity: Goal: Ability to tolerate increased activity will improve Outcome: Progressing   Problem: Clinical Measurements: Goal: Ability to maintain a body temperature in the normal range will improve Outcome: Progressing   Problem: Respiratory: Goal: Ability to maintain adequate ventilation will improve Outcome: Progressing Goal: Ability to maintain a clear airway will improve Outcome: Progressing   Problem: Education: Goal: Knowledge of General Education information will improve Description: Including pain rating scale, medication(s)/side effects and non-pharmacologic comfort measures Outcome: Progressing   Problem: Health Behavior/Discharge Planning: Goal: Ability to manage health-related needs will improve Outcome: Progressing   Problem: Clinical Measurements: Goal: Ability to maintain clinical measurements within normal limits will improve Outcome: Progressing Goal: Will remain free from infection Outcome: Progressing Goal: Diagnostic test results will improve Outcome: Progressing Goal: Respiratory complications will improve Outcome: Progressing Goal: Cardiovascular complication will be avoided Outcome: Progressing   Problem: Activity: Goal: Risk for activity intolerance will decrease Outcome: Progressing   Problem: Pain Managment: Goal: General experience of comfort will improve Outcome: Progressing   Problem: Safety: Goal: Ability to remain free from injury will improve Outcome: Progressing

## 2021-08-03 NOTE — Plan of Care (Signed)
°  Problem: Activity: Goal: Ability to tolerate increased activity will improve Outcome: Progressing   Problem: Clinical Measurements: Goal: Ability to maintain a body temperature in the normal range will improve Outcome: Progressing   Problem: Respiratory: Goal: Ability to maintain adequate ventilation will improve Outcome: Progressing Goal: Ability to maintain a clear airway will improve Outcome: Progressing   Problem: Education: Goal: Knowledge of General Education information will improve Description: Including pain rating scale, medication(s)/side effects and non-pharmacologic comfort measures Outcome: Progressing   Problem: Health Behavior/Discharge Planning: Goal: Ability to manage health-related needs will improve Outcome: Progressing   Problem: Clinical Measurements: Goal: Ability to maintain clinical measurements within normal limits will improve Outcome: Progressing Goal: Will remain free from infection Outcome: Progressing Goal: Diagnostic test results will improve Outcome: Progressing Goal: Respiratory complications will improve Outcome: Progressing Goal: Cardiovascular complication will be avoided Outcome: Progressing   Problem: Activity: Goal: Risk for activity intolerance will decrease Outcome: Progressing   Problem: Pain Managment: Goal: General experience of comfort will improve Outcome: Progressing   Problem: Safety: Goal: Ability to remain free from injury will improve Outcome: Progressing

## 2021-08-04 DIAGNOSIS — J90 Pleural effusion, not elsewhere classified: Secondary | ICD-10-CM | POA: Diagnosis not present

## 2021-08-04 DIAGNOSIS — J189 Pneumonia, unspecified organism: Secondary | ICD-10-CM | POA: Diagnosis not present

## 2021-08-04 LAB — GLUCOSE, CAPILLARY: Glucose-Capillary: 89 mg/dL (ref 70–99)

## 2021-08-04 LAB — CBC
HCT: 39.4 % (ref 39.0–52.0)
Hemoglobin: 12.8 g/dL — ABNORMAL LOW (ref 13.0–17.0)
MCH: 26.6 pg (ref 26.0–34.0)
MCHC: 32.5 g/dL (ref 30.0–36.0)
MCV: 81.7 fL (ref 80.0–100.0)
Platelets: 397 10*3/uL (ref 150–400)
RBC: 4.82 MIL/uL (ref 4.22–5.81)
RDW: 14.8 % (ref 11.5–15.5)
WBC: 13.9 10*3/uL — ABNORMAL HIGH (ref 4.0–10.5)
nRBC: 0 % (ref 0.0–0.2)

## 2021-08-04 LAB — CYTOLOGY - NON PAP

## 2021-08-04 MED ORDER — CEFDINIR 300 MG PO CAPS: 300.0000 mg | ORAL_CAPSULE | Freq: Two times a day (BID) | ORAL | 0 refills | Status: AC

## 2021-08-04 MED ORDER — METRONIDAZOLE 500 MG PO TABS
500.0000 mg | ORAL_TABLET | Freq: Three times a day (TID) | ORAL | 0 refills | Status: AC
Start: 2021-08-04 — End: 2021-08-14

## 2021-08-04 NOTE — Progress Notes (Shared)
Subjective:  Pt seen at bedside during rounds this AM. He feels better today. He states that his cough has continued to improve. He is still sweating at night.  No other complaints or concerns at this time.  Objective:  Vital signs in last 24 hours: Vitals:   08/03/21 1600 08/03/21 1929 08/03/21 2322 08/04/21 0405  BP: (!) 119/59 122/66 113/65 107/75  Pulse:  98 96 92  Resp: 16 20 20 20   Temp: 98 F (36.7 C) 97.8 F (36.6 C) 98 F (36.7 C) 98.2 F (36.8 C)  TempSrc: Oral Oral Oral Oral  SpO2:  98% 92% 94%  Weight:      Height:        Constitutional:      General: He is not in acute distress.    Appearance: Normal appearance.  Eyes:     Extraocular Movements: Extraocular movements intact.     Pupils: Pupils are equal, round, and reactive to light.  Cardiovascular:     Rate and Rhythm: Normal rate and regular rhythm.     Pulses: Normal pulses.     Heart sounds: No murmur heard.   No friction rub. No gallop.  Pulmonary:     Effort: Pulmonary effort is normal.     Breath sounds: No wheezing or rhonchi. Rales in RLL field laterally.    Comments: Dullness to percussion of RLL field. Otherwise normal. Abdominal:     General: Abdomen is flat. Bowel sounds are normal. There is no distension.     Palpations: Abdomen is soft.     Tenderness: There is no abdominal tenderness.  Musculoskeletal:        General: No swelling, tenderness or deformity. Normal range of motion.     Right lower leg: No edema.     Left lower leg: No edema.  Skin:    General: Skin is warm and dry.  Neurological:     General: No focal deficit present.     Mental Status: He is alert and oriented to person, place, and time.  Psychiatric:        Mood and Affect: Mood normal.        Behavior: Behavior normal.   Afebrile, HDS CBC, WBC 13.9 down from 14.7  76 yo with a community-acquired pneumonia (RLL pna) with an uncomplicated parapneumonic effusion. S/p thora two days ago  Stayed extra day for  the night sweats, rales Stable for DC?   Assessment/Plan:  Principal Problem:   CAP (community acquired pneumonia)  #Right uncomplicated parapneumonic effusion Admitted for pneumonia and right-sided pleural effusion on recent imaging.  He is status post thoracentesis by IR on 1/7.  Almost 1 L of hazy, amber-colored fluid was drained.  Pleural protein of 3.9.  Total serum protein of 5.9. Ratio of 0.66, meeting Lights criteria for exudative effusion.  Body fluid culture so far shows no growth.  The patient has subjectively improved, and his pulm exam is improved but continues to demonstrate rales in RLL field. Will switch from ceftriaxone + azithromycin to ceftriaxone and metronidazole for tx of an uncomplicated effusion. -Continue IV ceftriaxone and metronidazole -Fluid cytology pending   #H/o asthma Patient has a history of asthma for which he is on nebulizer tx twice a day. He also has a rescuer inhaler which he has not used for the past 1.5 years. He has been holding his Dupixent at home due to his current symptoms. -Albuterol q4h PRN -Brovana and pulmicort q12h -Singulair 25 mg BID   #HTN  Patient is on irbesartan and metoprolol at home. Blood pressure has been mostly normotensive in the past 24 hours. -Continue home irbesartan 300 mg p.o. daily -Continue metoprolol 25 mg p.o. twice daily   #CAD, s/p 4-vessel CABG in 2020 -Continue home plavix   #HLD Patient is on atorvastatin 80 mg daily at home. Most recent lipid panel shows total cholesterol 159, LDL 99, HDL 44. -Continue atorvastatin 80 mg daily  Prior to Admission Living Arrangement: Home Anticipated Discharge Location: Home Barriers to Discharge: Medical stability Dispo: Anticipated discharge in approximately 1-2 day(s).   Orvis Brill, MD 08/04/2021, 6:40 AM Pager: [phone]  After 5pm on weekdays and 1pm on weekends: On Call pager [phone]

## 2021-08-04 NOTE — Progress Notes (Signed)
Nsg Discharge Note  Admit Date:  08/01/2021 Discharge date: 08/04/2021   [name] to be D/C'd Home per MD order.  AVS completed.     Discharge Medication: Allergies as of 08/04/2021       Reactions   Aspirin Shortness Of Breath   Amoxicillin    GI upset Did it involve swelling of the face/tongue/throat, SOB, or low BP? No Did it involve sudden or severe rash/hives, skin peeling, or any reaction on the inside of your mouth or nose? No Did you need to seek medical attention at a hospital or doctor's office? No When did it last happen?    20+ years   If all above answers are "NO", may proceed with cephalosporin use.   Sulfonamide Derivatives Other (See Comments)   Doesn't remember        Medication List     TAKE these medications    acetaminophen 500 MG tablet Commonly known as: TYLENOL Take 500-1,000 mg by mouth every 6 (six) hours as needed for moderate pain or headache.   albuterol 108 (90 Base) MCG/ACT inhaler Commonly known as: VENTOLIN HFA Inhale 2 puffs into the lungs every 6 (six) hours as needed for wheezing or shortness of breath.   atorvastatin 80 MG tablet Commonly known as: LIPITOR Take 1 tablet (80 mg total) by mouth daily. TAKE 1 TABLET BY MOUTH EVERY DAY AT 6PM What changed:  when to take this additional instructions   budesonide 0.25 MG/2ML nebulizer solution Commonly known as: PULMICORT USE 1 VIAL IN NEBULIZER TWICE DAILY What changed:  how much to take how to take this when to take this additional instructions   CALCIUM CITRATE-VITAMIN D PO Chew 1 tablet by mouth daily.   cefdinir 300 MG capsule Commonly known as: OMNICEF Take 1 capsule (300 mg total) by mouth 2 (two) times daily for 10 days.   Centrum tablet Take 1 tablet by mouth daily.   clopidogrel 75 MG tablet Commonly known as: PLAVIX TAKE 1 TABLET BY MOUTH EVERY DAY   formoterol 20 MCG/2ML nebulizer solution Commonly known as: PERFOROMIST Take 2 mLs (20 mcg total) by  nebulization 2 (two) times daily.   metoprolol tartrate 50 MG tablet Commonly known as: LOPRESSOR TAKE 1/2 TABLET BY MOUTH TWICE A DAY What changed:  how much to take how to take this when to take this additional instructions   metroNIDAZOLE 500 MG tablet Commonly known as: FLAGYL Take 1 tablet (500 mg total) by mouth 3 (three) times daily for 10 days.   montelukast 10 MG tablet Commonly known as: SINGULAIR Take 1 tablet (10 mg total) by mouth at bedtime.   omeprazole 20 MG capsule Commonly known as: PRILOSEC Take 1 capsule (20 mg total) by mouth daily.   telmisartan 80 MG tablet Commonly known as: Micardis Take 1 tablet (80 mg total) by mouth daily.        Discharge Assessment: Vitals:   08/04/21 0800 08/04/21 0832  BP: 134/82   Pulse: 84   Resp: 15   Temp:    SpO2: 94% 95%   Skin clean, dry and intact without evidence of skin break down, no evidence of skin tears noted. IV catheter discontinued intact. Site without signs and symptoms of complications - no redness or edema noted at insertion site, patient denies c/o pain - only slight tenderness at site.  Dressing with slight pressure applied.  D/c Instructions-Education: Discharge instructions given to patient/family with verbalized understanding. D/c education completed with patient/family including follow  up instructions, medication list, d/c activities limitations if indicated, with other d/c instructions as indicated by MD - patient able to verbalize understanding, all questions fully answered. Patient instructed to return to ED, call 911, or call MD for any changes in condition.  Patient escorted via [place], and D/C home via private auto.  Hiram Comber, RN 08/04/2021 10:59 AM

## 2021-08-04 NOTE — Discharge Summary (Addendum)
Name: Antonio Carlson, Antonio Carlson 76 y.o. PCP: [name], MD  Date of Admission: 08/01/2021  7:41 PM Date of Discharge: 08/04/2021 Attending Physician: Velna Ochs, MD  Discharge Diagnosis: 1.  Right Pleural Effusion  2.  History of asthma 3.  Hypertension 4.  CAD status post 4 vessel CABG in 2020 5.  Hyperlipidemia  Discharge Medications: Allergies as of 08/04/2021       Reactions   Aspirin Shortness Of Breath   Amoxicillin    GI upset Did it involve swelling of the face/tongue/throat, SOB, or low BP? No Did it involve sudden or severe rash/hives, skin peeling, or any reaction on the inside of your mouth or nose? No Did you need to seek medical attention at a hospital or doctor's office? No When did it last happen?    20+ years   If all above answers are "NO", may proceed with cephalosporin use.   Sulfonamide Derivatives Other (See Comments)   Doesn't remember        Medication List     TAKE these medications    acetaminophen 500 MG tablet Commonly known as: TYLENOL Take 500-1,000 mg by mouth every 6 (six) hours as needed for moderate pain or headache.   albuterol 108 (90 Base) MCG/ACT inhaler Commonly known as: VENTOLIN HFA Inhale 2 puffs into the lungs every 6 (six) hours as needed for wheezing or shortness of breath.   atorvastatin 80 MG tablet Commonly known as: LIPITOR Take 1 tablet (80 mg total) by mouth daily. TAKE 1 TABLET BY MOUTH EVERY DAY AT 6PM What changed:  when to take this additional instructions   budesonide 0.25 MG/2ML nebulizer solution Commonly known as: PULMICORT USE 1 VIAL IN NEBULIZER TWICE DAILY What changed:  how much to take how to take this when to take this additional instructions   CALCIUM CITRATE-VITAMIN D PO Chew 1 tablet by mouth daily.   cefdinir 300 MG capsule Commonly known as: OMNICEF Take 1 capsule (300 mg total) by mouth 2 (two) times daily for 10 days.   Centrum tablet Take 1  tablet by mouth daily.   clopidogrel 75 MG tablet Commonly known as: PLAVIX TAKE 1 TABLET BY MOUTH EVERY DAY   formoterol 20 MCG/2ML nebulizer solution Commonly known as: PERFOROMIST Take 2 mLs (20 mcg total) by nebulization 2 (two) times daily.   metoprolol tartrate 50 MG tablet Commonly known as: LOPRESSOR TAKE 1/2 TABLET BY MOUTH TWICE A DAY What changed:  how much to take how to take this when to take this additional instructions   metroNIDAZOLE 500 MG tablet Commonly known as: FLAGYL Take 1 tablet (500 mg total) by mouth 3 (three) times daily for 10 days.   montelukast 10 MG tablet Commonly known as: SINGULAIR Take 1 tablet (10 mg total) by mouth at bedtime.   omeprazole 20 MG capsule Commonly known as: PRILOSEC Take 1 capsule (20 mg total) by mouth daily.   telmisartan 80 MG tablet Commonly known as: Micardis Take 1 tablet (80 mg total) by mouth daily.        Disposition and follow-up:   Antonio Carlson was discharged from [facility] in Good condition.  At the hospital follow up visit please address:  1.  Right pleural effusion: Suspected to be a parapneumonic effusion and treated for such. Patient underwent thoracentesis on 1/7, and he improved clinically during his hospitalization.  At discharge, he was transition to oral cefdinir and metronidazole regimens.  He will continue these for the next 10 days.  He will need a chest x-ray to ensure resolution of the effusion in the next couple weeks after discharge.  2.  Labs / imaging needed at time of follow-up: Chest x-ray  3.  Pending labs/ test needing follow-up: Pathologist smear was obtained in the ED, possibly for thrombocytosis to 463 on admit.  However, thrombocytosis resolved during his hospitalization.  Follow-up Appointments:   Hospital Course by problem list: 1. Right Pleural Effusion, presumed uncomplicated para pneumonic  The patient presented here with progressively worsening  pleuritic chest pain/RLQ abdominal pain. Was first seen in UC, and the chest x-ray at that time showed a small to moderate right pleural effusion, also with concern for RLQ.  His pulmonologist Dr. Melvyn Novas advised that he present to [facility].  In the ED, patient was afebrile, tachypneic but satting in the high 90s on RA. Labs were remarkable for WBC 13.7, platelets 463, normal EKG, trop flat, negative resp panel. Differential included parapneumonic effusion vs malignancy. The pain is not associated with eating making a biliary process less likely, and he did not have any urinary symptoms or CVA tenderness on exam, making nephrolithiasis less likely. Low suspicion for PE or aortic dissection given imaging findings, low suspicion for ACS with nml troponins and EKG.  The patient was initially placed on ceftriaxone and azithromycin for presumed CAP, and IR was consulted for thoracentesis.  This was performed on 1/7. Almost 1 L of hazy, amber-colored fluid was drained.  Pleural protein of 3.9.  Total serum protein of 5.9. Ratio of 0.66, meeting Lights criteria for exudative effusion.  Body fluid culture showed no growth.    Over the next couple of days, the patient subjectively improved, and was also reflected in his pulmonary exam with decreased rales in the right lower lung field throughout his hospitalization.  He continued to sat well on room air.  He did complain of night sweats on 1/8, and given that he still had rales on exam, we observed him overnight.  Also on 1/8, he was switched from ceftriaxone and azithromycin to ceftriaxone and metronidazole for treatment of uncomplicated effusion.  On day of discharge, the patient again endorsed improvement, and he was transitioned to p.o. cefdinir and metronidazole regimens.  He will continue this regimen for the next 10 days.  Will need repeat chest x-ray in a couple weeks to ensure resolution of the pleural effusion.   2. H/o asthma Patient has a history of  asthma for which he is on nebulizer tx twice a day. He also has a rescuer inhaler which he has not used for the past 1.5 years. He had been holding his Dupixent at home due to his current symptoms.  In the hospital, the patient was continued on albuterol every 4 hours as needed, Brovana and Pulmicort every 12 hours, and Singulair 25 mg twice daily.   3. HTN Patient is on irbesartan and metoprolol at home.  Blood pressure was well controlled in the hospital while we continued his home regimen.   4. CAD, s/p 4-vessel CABG in 2020 Home Plavix was briefly held in preparation for his thoracentesis on 1/7, however this was resumed after the procedure and continued through discharge.   5. HLD Patient is on atorvastatin 80 mg daily at home. Most recent lipid panel shows total cholesterol 159, LDL 99, HDL 44.  We continued his home atorvastatin during his hospitalization.  Discharge Exam:   BP 134/82  Pulse 84    Temp 98.3 F (36.8 C) (Oral)    Resp 15    Ht 5\' 8"  (1.727 m)    Wt 77.2 kg    SpO2 95%    BMI 25.88 kg/m  Constitutional:      General: He is not in acute distress.    Appearance: Normal appearance.  Eyes:     Extraocular Movements: Extraocular movements intact.     Pupils: Pupils are equal, round, and reactive to light.  Cardiovascular:     Rate and Rhythm: Normal rate and regular rhythm.     Pulses: Normal pulses.     Heart sounds: No murmur heard.   No friction rub. No gallop.  Pulmonary:     Effort: Pulmonary effort is normal.     Breath sounds: No wheezing or rhonchi. Rales in RLL field laterally, improved from exam yesterday.  Breath sounds appreciated in all lung fields, though decreased in lower lung fields. Abdominal:     General: Abdomen is flat. Bowel sounds are normal. There is no distension.     Palpations: Abdomen is soft.     Tenderness: There is no abdominal tenderness.  Musculoskeletal:        General: No swelling, tenderness or deformity. Normal range of motion.      Right lower leg: No edema.     Left lower leg: No edema.  Skin:    General: Skin is warm and dry.  Neurological:     General: No focal deficit present.     Mental Status: He is alert and oriented to person, place, and time.  Psychiatric:        Mood and Affect: Mood normal.        Behavior: Behavior normal.     Pertinent Labs, Studies, and Procedures:  DG Chest 1 View  Result Date: 08/02/2021 CLINICAL DATA:  Status post right thoracentesis.  History of asthma. EXAM: CHEST  1 VIEW COMPARISON:  Chest radiograph dated August 01, 2021 FINDINGS: Interval decrease in the right pleural effusion. Right basilar atelectasis with small right pleural effusion. Left basilar atelectasis is unchanged. No appreciable pneumothorax. Sternotomy wires are intact.  No acute osseous abnormality. IMPRESSION: Status post right thoracentesis with interval decrease in size of the right pleural effusion. No appreciable pneumothorax. Electronically Signed   By: Keane Police D.O.   On: 08/02/2021 11:30   DG Chest 2 View  Result Date: 08/01/2021 CLINICAL DATA:  Right lower lobe effusion. EXAM: CHEST - 2 VIEW COMPARISON:  Chest radiograph dated 07/01/2021. FINDINGS: There is a small right pleural effusion with right lung base atelectasis or infiltrate. Trace left pleural effusion versus scarring. No pneumothorax. The cardiac silhouette is within normal limits. Median sternotomy wires and CABG vascular clips. Atherosclerotic calcification of the aorta. Degenerative changes of the spine. No acute osseous pathology. IMPRESSION: Small right pleural effusion with right lung base atelectasis or infiltrate. Electronically Signed   By: Anner Crete M.D.   On: 08/01/2021 20:52   US THORACENTESIS ASP PLEURAL SPACE W/IMG GUIDE  Result Date: 08/02/2021 INDICATION: Shortness of breath, right pleural effusion is seen on previous chest x-ray. Request for therapeutic and diagnostic thoracentesis. EXAM: ULTRASOUND GUIDED RIGHT  THORACENTESIS MEDICATIONS: 10 mL 1% lidocaine COMPLICATIONS: None immediate. PROCEDURE: An ultrasound guided thoracentesis was thoroughly discussed with the patient and questions answered. The benefits, risks, alternatives and complications were also discussed. The patient understands and wishes to proceed with the procedure. Written consent was obtained. Ultrasound was performed to  localize and mark an adequate pocket of fluid in the right chest. The area was then prepped and draped in the normal sterile fashion. 1% Lidocaine was used for local anesthesia. Under ultrasound guidance a 6 Fr Safe-T-Centesis catheter was introduced. Thoracentesis was performed. The catheter was removed and a dressing applied. FINDINGS: A total of approximately 950 cc of hazy amber fluid was removed. Samples were sent to the laboratory as requested by the clinical team. Post procedure chest X-ray reviewed, negative for pneumothorax. IMPRESSION: Successful ultrasound guided right thoracentesis yielding 950 cc of pleural fluid. Read by: Durenda Guthrie, PA-C Electronically Signed   By: Jerilynn Mages.  Shick M.D.   On: 08/02/2021 11:49    CBC Latest Ref Rng & Units 08/04/2021 08/03/2021 08/01/2021  WBC 4.0 - 10.5 K/uL 13.9(H) 14.7(H) 13.7(H)  Hemoglobin 13.0 - 17.0 g/dL 12.8(L) 13.3 14.1  Hematocrit 39.0 - 52.0 % 39.4 40.5 42.8  Platelets 150 - 400 K/uL 397 398 463(H)   CMP Latest Ref Rng & Units 08/03/2021 08/03/2021 08/01/2021  Glucose 70 - 99 mg/dL 94 99 130(H)  BUN 8 - 23 mg/dL 14 12 16   Creatinine 0.61 - 1.24 mg/dL 0.95 0.91 1.04  Sodium 135 - 145 mmol/L 134(L) 137 137  Potassium 3.5 - 5.1 mmol/L 3.9 3.8 3.8  Chloride 98 - 111 mmol/L 99 102 103  CO2 Carlson - 32 mmol/L 25 24 24   Calcium 8.9 - 10.3 mg/dL 8.4(L) 8.6(L) 8.8(L)  Total Protein 6.5 - 8.1 g/dL - 5.9(L) -  Total Bilirubin 0.3 - 1.2 mg/dL - 0.6 -  Alkaline Phos 38 - 126 U/L - 180(H) -  AST 15 - 41 U/L - 32 -  ALT 0 - 44 U/L - 36 -    CBG (last 3)  Recent Labs    08/03/21 0751  08/04/21 0744  GLUCAP 103* 89   Thoracentesis: Gram stain showed WBC but no organisms Body fluid culture: no growth Glucose, fluid 84 Protein, fluid 3.9 Body fluid count: 2,054  Discharge Instructions: Discharge Instructions     Call MD for:  difficulty breathing, headache or visual disturbances   Complete by: As directed    Call MD for:  temperature >100.4   Complete by: As directed    Diet - low sodium heart healthy   Complete by: As directed    Discharge instructions   Complete by: As directed    Antonio Carlson,  It was a pleasure taking care of you during this admission.  You were hospitalized for fluid accumulating in your right lung.  We performed a thoracentesis and collected 950 cc of fluid.  We think you have pneumonia which caused the fluid to build up.  You have received IV antibiotic in the hospital.  We will discharge you with 10 more days of oral antibiotics: Cefdinir and metronidazole.  Please take them as instructed.    Please make a follow-up appointment with your pulmonologist and primary care doctor in 1-2 weeks.   Take care,  Dr. Alfonse Spruce   Increase activity slowly   Complete by: As directed        Signed: Orvis Brill, MD 08/04/2021, 10:03 AM   Pager: [phone]

## 2021-08-05 LAB — PATHOLOGIST SMEAR REVIEW

## 2021-08-07 LAB — CULTURE, BODY FLUID W GRAM STAIN -BOTTLE: Culture: NO GROWTH

## 2021-08-11 ENCOUNTER — Telehealth: Payer: Self-pay | Admitting: Allergy

## 2021-08-11 ENCOUNTER — Other Ambulatory Visit: Payer: Self-pay

## 2021-08-11 ENCOUNTER — Encounter: Payer: Self-pay | Admitting: Adult Health

## 2021-08-11 ENCOUNTER — Ambulatory Visit (INDEPENDENT_AMBULATORY_CARE_PROVIDER_SITE_OTHER): Payer: Medicare Other

## 2021-08-11 ENCOUNTER — Ambulatory Visit (INDEPENDENT_AMBULATORY_CARE_PROVIDER_SITE_OTHER): Payer: Medicare Other | Admitting: Adult Health

## 2021-08-11 VITALS — BP 137/66 | HR 87 | Temp 98.0°F | Ht 68.0 in | Wt 176.4 lb

## 2021-08-11 DIAGNOSIS — J90 Pleural effusion, not elsewhere classified: Secondary | ICD-10-CM | POA: Diagnosis not present

## 2021-08-11 DIAGNOSIS — Z886 Allergy status to analgesic agent status: Secondary | ICD-10-CM | POA: Diagnosis not present

## 2021-08-11 DIAGNOSIS — J45909 Unspecified asthma, uncomplicated: Secondary | ICD-10-CM | POA: Diagnosis not present

## 2021-08-11 DIAGNOSIS — J339 Nasal polyp, unspecified: Secondary | ICD-10-CM | POA: Diagnosis not present

## 2021-08-11 DIAGNOSIS — J189 Pneumonia, unspecified organism: Secondary | ICD-10-CM

## 2021-08-11 DIAGNOSIS — D721 Eosinophilia, unspecified: Secondary | ICD-10-CM | POA: Diagnosis not present

## 2021-08-11 LAB — CBC WITH DIFFERENTIAL/PLATELET
Basophils Absolute: 0.2 10*3/uL — ABNORMAL HIGH (ref 0.0–0.1)
Basophils Relative: 1 % (ref 0.0–3.0)
Eosinophils Absolute: 5.1 10*3/uL — ABNORMAL HIGH (ref 0.0–0.7)
Eosinophils Relative: 32.4 % — ABNORMAL HIGH (ref 0.0–5.0)
HCT: 42.5 % (ref 39.0–52.0)
Hemoglobin: 13.7 g/dL (ref 13.0–17.0)
Lymphocytes Relative: 12.2 % (ref 12.0–46.0)
Lymphs Abs: 1.9 10*3/uL (ref 0.7–4.0)
MCHC: 32.1 g/dL (ref 30.0–36.0)
MCV: 81.8 fl (ref 78.0–100.0)
Monocytes Absolute: 1.4 10*3/uL — ABNORMAL HIGH (ref 0.1–1.0)
Monocytes Relative: 8.9 % (ref 3.0–12.0)
Neutro Abs: 7.2 10*3/uL (ref 1.4–7.7)
Neutrophils Relative %: 45.5 % (ref 43.0–77.0)
Platelets: 541 10*3/uL — ABNORMAL HIGH (ref 150.0–400.0)
RBC: 5.2 Mil/uL (ref 4.22–5.81)
RDW: 15.7 % — ABNORMAL HIGH (ref 11.5–15.5)
WBC: 15.9 10*3/uL — ABNORMAL HIGH (ref 4.0–10.5)

## 2021-08-11 NOTE — Assessment & Plan Note (Signed)
Patient with known triad asthma.  Clinically has been doing well on current regimen.  However recent labs showed significant increase in peripheral eosinophils after Dupixent was started.  Patient also with acute illness with suspected pneumonia and parapneumonic effusion. Continue on current regimen however hold Dupixent for now. We will have him follow back up with allergist  Plan  . Patient Instructions  Finish antibiotics as directed  Mucinex DM Twice daily As needed  cough/congestion  Chest xray today .  Labs today  Activity as tolerated .  Add Probiotic daily for next 4 weeks.  Continue on Budesonide and Perforomist Neb Twice daily   Continue on Singulair daily  Albuterol inhaler as needed  Follow up with Allergist this month and discuss Dupixent.  Follow up with Dr. Melvyn Novas  in 3-4 weeks and As needed   Please contact office for sooner follow up if symptoms do not improve or worsen or seek emergency care

## 2021-08-11 NOTE — Patient Instructions (Addendum)
Finish antibiotics as directed  Mucinex DM Twice daily As needed  cough/congestion  Chest xray today .  Labs today  Activity as tolerated .  Add Probiotic daily for next 4 weeks.  Continue on Budesonide and Perforomist Neb Twice daily   Continue on Singulair daily  Albuterol inhaler as needed  Follow up with Allergist this month and discuss Dupixent.  Follow up with Dr. Melvyn Novas  in 3-4 weeks and As needed   Please contact office for sooner follow up if symptoms do not improve or worsen or seek emergency care

## 2021-08-11 NOTE — Assessment & Plan Note (Signed)
Suspected right-sided community-acquired pneumonia with exudative/parapneumonic pneumonia.  Patient is clinically improved.  We will have him finish up antibiotics with cefdinir and Flagyl. Close follow-up.  Check chest x-ray today. Lab work showed significant peripheral eosinophilia.  We will have him hold Dupixent and discussed with allergist moving forward  Plan  Patient Instructions  Finish antibiotics as directed  Mucinex DM Twice daily As needed  cough/congestion  Chest xray today .  Labs today  Activity as tolerated .  Add Probiotic daily for next 4 weeks.  Continue on Budesonide and Perforomist Neb Twice daily   Continue on Singulair daily  Albuterol inhaler as needed  Follow up with Allergist this month and discuss Dupixent.  Follow up with Dr. Melvyn Novas  in 3-4 weeks and As needed   Please contact office for sooner follow up if symptoms do not improve or worsen or seek emergency care      n

## 2021-08-11 NOTE — Progress Notes (Signed)
@Patient  ID: Antonio Carlson, male    DOB: Nov 10, 1945, 76 y.o.   MRN: [id number]  Chief Complaint  Patient presents with   Hospitalization Follow-up    Referring provider: [name], MD  HPI: 76 year old male former smoker followed for triad asthma (elevated eosinophils) Followed for primary care by Dr. Melvyn Novas Medical history significant for hyperlipidemia, hypertension, coronary artery disease status post CABG in October 2020 and atrial fibrillation  TEST/EVENTS :  PFT's  09/16/2015  FEV1 1.81 (61 % ) ratio 56  p 44 % improvement from saba with DLCO  81/81c % corrects to 107  % for alv volume  Done prior to any am meds     Allergy profile 10/09/19   >  Eos 0.9 /  IgE  106    08/11/2021 Follow up : Triad asthma, post hospital follow-up, pneumonia Patient returns for a post hospital follow-up.  Patient presented to the hospital earlier this month with progressive shortness of breath and pleuritic chest pain.  He was found to have a moderate sized right pleural effusion felt to be parapneumonic.  He underwent thoracentesis via interventional radiology.  1 L of hazy amber fluid was removed.  Fluid analysis consistent with an exudative process.  Pleural fluid cultures were negative.  Cytology showed mixed acute and chronic inflammation negative for malignant cells.  Patient was treated with aggressive antibiotics and discharged on a Cefdinir and Flagyl for 10 days . Has few days left on antibiotics. Feeling better. Eating good. Has some loose stools. No bloody stools or fever.  Cough and congestion are less.  Cbc showed elevated neutrophils/eosinophils, path review showed neutrophillia , eosinophilia and thrombocytosis . Pleural fluid showed eosinophils 6, TNC 2,054  Has Triad Asthma. Remains on Pulmicort and Perfomist neb Twice daily  . No wheezing . No increased albuterol use. Has not used albuterol in 1 year.  Follows with Allergist now on [place] (started early Dec 2022) .   Is very  active, has a farm. Hunts with sons. Got remarried after his wife of 31 years passed away.       Allergies  Allergen Reactions   Aspirin Shortness Of Breath   Amoxicillin     GI upset Did it involve swelling of the face/tongue/throat, SOB, or low BP? No Did it involve sudden or severe rash/hives, skin peeling, or any reaction on the inside of your mouth or nose? No Did you need to seek medical attention at a hospital or doctor's office? No When did it last happen?    20+ years   If all above answers are "NO", may proceed with cephalosporin use.    Sulfonamide Derivatives Other (See Comments)    Doesn't remember    Immunization History  Administered Date(s) Administered   Fluad Quad(high Dose 65+) 05/05/2019   Influenza Split 07/17/2011, 05/10/2012, 04/26/2014   Influenza Whole 05/17/2008, 06/11/2009   Influenza, High Dose Seasonal PF 05/08/2017, 05/05/2018, 05/21/2021   Influenza,inj,Quad PF,6+ Mos 08/14/2013, 04/29/2015   Influenza-Unspecified 04/27/2016   PFIZER(Purple Top)SARS-COV-2 Vaccination 10/21/2019, 11/14/2019   Pneumococcal Conjugate-13 08/14/2013   Pneumococcal Polysaccharide-23 06/11/2009   Tdap 08/01/2015    Past Medical History:  Diagnosis Date   Allergy    Anginal pain (HCC)    Arthritis    Asthma    Benign prostatic hypertrophy    Coronary artery disease    Diverticulosis    Dyspnea    GERD (gastroesophageal reflux disease)    History of kidney stones    Hyperlipidemia  Hypertension    Nephrolithiasis    PVC's (premature ventricular contractions)    Syncope     Tobacco History: Social History   Tobacco Use  Smoking Status Former   Packs/day: 2.00   Years: 14.00   Pack years: 28.00   Types: Cigarettes   Quit date: 07/27/1969   Years since quitting: 52.0  Smokeless Tobacco Never   Counseling given: Not Answered   Outpatient Medications Prior to Visit  Medication Sig Dispense Refill   acetaminophen (TYLENOL) 500 MG tablet Take  500-1,000 mg by mouth every 6 (six) hours as needed for moderate pain or headache.     albuterol (VENTOLIN HFA) 108 (90 Base) MCG/ACT inhaler Inhale 2 puffs into the lungs every 6 (six) hours as needed for wheezing or shortness of breath.     atorvastatin (LIPITOR) 80 MG tablet Take 1 tablet (80 mg total) by mouth daily. TAKE 1 TABLET BY MOUTH EVERY DAY AT 6PM (Patient taking differently: Take 80 mg by mouth every evening.) 90 tablet 3   budesonide (PULMICORT) 0.25 MG/2ML nebulizer solution USE 1 VIAL IN NEBULIZER TWICE DAILY (Patient taking differently: Take 0.25 mg by nebulization in the morning and at bedtime.) 120 mL 11   CALCIUM CITRATE-VITAMIN D PO Chew 1 tablet by mouth daily.     cefdinir (OMNICEF) 300 MG capsule Take 1 capsule (300 mg total) by mouth 2 (two) times daily for 10 days. 20 capsule 0   clopidogrel (PLAVIX) 75 MG tablet TAKE 1 TABLET BY MOUTH EVERY DAY (Patient taking differently: Take 75 mg by mouth daily.) 90 tablet 3   formoterol (PERFOROMIST) 20 MCG/2ML nebulizer solution Take 2 mLs (20 mcg total) by nebulization 2 (two) times daily. 120 mL 6   metoprolol tartrate (LOPRESSOR) 50 MG tablet TAKE 1/2 TABLET BY MOUTH TWICE A DAY (Patient taking differently: Take 25 mg by mouth 2 (two) times daily.) 90 tablet 1   metroNIDAZOLE (FLAGYL) 500 MG tablet Take 1 tablet (500 mg total) by mouth 3 (three) times daily for 10 days. 30 tablet 0   montelukast (SINGULAIR) 10 MG tablet Take 1 tablet (10 mg total) by mouth at bedtime. 90 tablet 4   Multiple Vitamins-Minerals (CENTRUM) tablet Take 1 tablet by mouth daily.     omeprazole (PRILOSEC) 20 MG capsule Take 1 capsule (20 mg total) by mouth daily. 90 capsule 1   telmisartan (MICARDIS) 80 MG tablet Take 1 tablet (80 mg total) by mouth daily. 90 tablet 3   No facility-administered medications prior to visit.     Review of Systems:   Constitutional:   No  weight loss, night sweats,  Fevers, chills,  +fatigue, or  lassitude.  HEENT:    No headaches,  Difficulty swallowing,  Tooth/dental problems, or  Sore throat,                No sneezing, itching, ear ache, nasal congestion, post nasal drip,   CV:  No chest pain,  Orthopnea, PND, swelling in lower extremities, anasarca, dizziness, palpitations, syncope.   GI  No heartburn, indigestion, abdominal pain, nausea, vomiting,  loss of appetite, bloody stools.   Resp: .  No chest wall deformity  Skin: no rash or lesions.  GU: no dysuria, change in color of urine, no urgency or frequency.  No flank pain, no hematuria   MS:  No joint pain or swelling.  No decreased range of motion.  No back pain.    Physical Exam  BP 137/66 (BP Location: Left Arm,  Patient Position: Sitting, Cuff Size: Normal)    Pulse 87    Temp 98 F (36.7 C) (Oral)    Ht 5\' 8"  (1.727 m)    Wt 176 lb 6.4 oz (80 kg)    SpO2 96%    BMI 26.82 kg/m   GEN: A/Ox3; pleasant , NAD, elderly    HEENT:  [place]/AT,  NOSE-clear, THROAT-clear, no lesions, no postnasal drip or exudate noted.   NECK:  Supple w/ fair ROM; no JVD; normal carotid impulses w/o bruits; no thyromegaly or nodules palpated; no lymphadenopathy.    RESP  Clear  P & A; w/o, wheezes/ rales/ or rhonchi. no accessory muscle use, no dullness to percussion  CARD:  RRR, no m/r/g, no peripheral edema, pulses intact, no cyanosis or clubbing.  GI:   Soft & nt; nml bowel sounds; no organomegaly or masses detected.   Musco: Warm bil, no deformities or joint swelling noted.   Neuro: alert, no focal deficits noted.    Skin: Warm, no lesions or rashes    Lab Results:  CBC    Component Value Date/Time   WBC 13.9 (H) 08/04/2021 0135   RBC 4.82 08/04/2021 0135   HGB 12.8 (L) 08/04/2021 0135   HCT 39.4 08/04/2021 0135   PLT 397 08/04/2021 0135   MCV 81.7 08/04/2021 0135   MCH 26.6 08/04/2021 0135   MCHC 32.5 08/04/2021 0135   RDW 14.8 08/04/2021 0135   LYMPHSABS 1.9 08/01/2021 2028   MONOABS 0.5 08/01/2021 2028   EOSABS 2.7 (H) 08/01/2021 2028    BASOSABS 0.1 08/01/2021 2028    BMET    Component Value Date/Time   NA 134 (L) 08/03/2021 1729   NA 143 05/29/2019 1055   K 3.9 08/03/2021 1729   CL 99 08/03/2021 1729   CO2 25 08/03/2021 1729   GLUCOSE 94 08/03/2021 1729   BUN 14 08/03/2021 1729   BUN 17 05/29/2019 1055   CREATININE 0.95 08/03/2021 1729   CALCIUM 8.4 (L) 08/03/2021 1729   GFRNONAA >60 08/03/2021 1729   GFRAA 65 05/29/2019 1055    BNP No results found for: BNP  ProBNP No results found for: PROBNP  Imaging: DG Chest 1 View  Result Date: 08/02/2021 CLINICAL DATA:  Status post right thoracentesis.  History of asthma. EXAM: CHEST  1 VIEW COMPARISON:  Chest radiograph dated August 01, 2021 FINDINGS: Interval decrease in the right pleural effusion. Right basilar atelectasis with small right pleural effusion. Left basilar atelectasis is unchanged. No appreciable pneumothorax. Sternotomy wires are intact.  No acute osseous abnormality. IMPRESSION: Status post right thoracentesis with interval decrease in size of the right pleural effusion. No appreciable pneumothorax. Electronically Signed   By: Keane Police D.O.   On: 08/02/2021 11:30   DG Chest 2 View  Result Date: 08/01/2021 CLINICAL DATA:  Right lower lobe effusion. EXAM: CHEST - 2 VIEW COMPARISON:  Chest radiograph dated 07/01/2021. FINDINGS: There is a small right pleural effusion with right lung base atelectasis or infiltrate. Trace left pleural effusion versus scarring. No pneumothorax. The cardiac silhouette is within normal limits. Median sternotomy wires and CABG vascular clips. Atherosclerotic calcification of the aorta. Degenerative changes of the spine. No acute osseous pathology. IMPRESSION: Small right pleural effusion with right lung base atelectasis or infiltrate. Electronically Signed   By: Anner Crete M.D.   On: 08/01/2021 20:52   US THORACENTESIS ASP PLEURAL SPACE W/IMG GUIDE  Result Date: 08/02/2021 INDICATION: Shortness of breath, right  pleural effusion is seen on  previous chest x-ray. Request for therapeutic and diagnostic thoracentesis. EXAM: ULTRASOUND GUIDED RIGHT THORACENTESIS MEDICATIONS: 10 mL 1% lidocaine COMPLICATIONS: None immediate. PROCEDURE: An ultrasound guided thoracentesis was thoroughly discussed with the patient and questions answered. The benefits, risks, alternatives and complications were also discussed. The patient understands and wishes to proceed with the procedure. Written consent was obtained. Ultrasound was performed to localize and mark an adequate pocket of fluid in the right chest. The area was then prepped and draped in the normal sterile fashion. 1% Lidocaine was used for local anesthesia. Under ultrasound guidance a 6 Fr Safe-T-Centesis catheter was introduced. Thoracentesis was performed. The catheter was removed and a dressing applied. FINDINGS: A total of approximately 950 cc of hazy amber fluid was removed. Samples were sent to the laboratory as requested by the clinical team. Post procedure chest X-ray reviewed, negative for pneumothorax. IMPRESSION: Successful ultrasound guided right thoracentesis yielding 950 cc of pleural fluid. Read by: Durenda Guthrie, PA-C Electronically Signed   By: Jerilynn Mages.  Shick M.D.   On: 08/02/2021 11:49    dupilumab (DUPIXENT) prefilled syringe 600 mg     Date Action Dose Route User   Discharged on 08/04/2021   Admitted on 08/01/2021   07/03/2021 0913 Given 600 mg Subcutaneous (Other) Herbie Drape, LPN      dupilumab (DUPIXENT) prefilled syringe 600 mg     Date Action Dose Route User   Discharged on 08/04/2021   Admitted on 08/01/2021   07/03/2021 1215 Given 600 mg Subcutaneous (Right Arm) Carin Hock, CMA       PFT Results Latest Ref Rng & Units 04/27/2019 09/16/2015  FVC-Pre L 3.36 2.76  FVC-Predicted Pre % 86 68  FVC-Post L 3.70 3.22  FVC-Predicted Post % 94 80  Pre FEV1/FVC % % 56 45  Post FEV1/FCV % % 57 56  FEV1-Pre L 1.87 1.25  FEV1-Predicted Pre % 66 42   FEV1-Post L 2.11 1.81  DLCO uncorrected ml/min/mmHg 24.49 24.21  DLCO UNC% % 103 81  DLCO corrected ml/min/mmHg 23.27 24.14  DLCO COR %Predicted % 98 81  DLVA Predicted % 109 107  TLC L 8.31 7.53  TLC % Predicted % 125 113  RV % Predicted % 195 187    No results found for: NITRICOXIDE      Assessment & Plan:   CAP (community acquired pneumonia) Suspected right-sided community-acquired pneumonia with exudative/parapneumonic pneumonia.  Patient is clinically improved.  We will have him finish up antibiotics with cefdinir and Flagyl. Close follow-up.  Check chest x-ray today. Lab work showed significant peripheral eosinophilia.  We will have him hold Dupixent and discussed with allergist moving forward  Plan  Patient Instructions  Finish antibiotics as directed  Mucinex DM Twice daily As needed  cough/congestion  Chest xray today .  Labs today  Activity as tolerated .  Add Probiotic daily for next 4 weeks.  Continue on Budesonide and Perforomist Neb Twice daily   Continue on Singulair daily  Albuterol inhaler as needed  Follow up with Allergist this month and discuss Dupixent.  Follow up with Dr. 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  Clinically has been doing well on current regimen.  However recent labs showed significant increase in peripheral eosinophils after Dupixent was started.  Patient also with acute  illness with suspected pneumonia and parapneumonic effusion. Continue on current regimen however hold Dupixent for now. We will have him follow back up with allergist  Plan  . Patient Instructions  Finish antibiotics as directed  Mucinex DM Twice daily As needed  cough/congestion  Chest xray today .  Labs today  Activity as tolerated .  Add Probiotic daily for next 4 weeks.  Continue on Budesonide and  Perforomist Neb Twice daily   Continue on Singulair daily  Albuterol inhaler as needed  Follow up with Allergist this month and discuss Dupixent.  Follow up with Dr. Melvyn Novas  in 3-4 weeks and As needed   Please contact office for sooner follow up if symptoms do not improve or worsen or seek emergency care          Eosinophilia Recent acute illness with CBC with differential showing significant eosinophilia, absolute if eosinophil count 2700.  Will repeat CBC with differential today. Hold Dupixent and follow-up with allergist.   I spent   43 minutes dedicated to the care of this patient on the date of this encounter to include pre-visit review of records, face-to-face time with the patient discussing conditions above, post visit ordering of testing, clinical documentation with the electronic health record, making appropriate referrals as documented, and communicating necessary findings to members of the patients care team.    Rexene Edison, NP 08/11/2021

## 2021-08-11 NOTE — Assessment & Plan Note (Signed)
Recent acute illness with CBC with differential showing significant eosinophilia, absolute if eosinophil count 2700.  Will repeat CBC with differential today. Hold Dupixent and follow-up with allergist.

## 2021-08-11 NOTE — Telephone Encounter (Signed)
Please call patient. See if he can come in for a visit this week to discuss his elevated eosinophils.   Okay to add to same day visit slot.   Thank you.

## 2021-08-12 NOTE — Telephone Encounter (Signed)
Pt is scheduled for 1030 tomorrow in [place] with Dr Maudie Mercury

## 2021-08-12 NOTE — Progress Notes (Signed)
Follow Up Note  RE: Antonio Carlson MRN: [id number] DOB: [birth year] Date of Office Visit: 08/13/2021  Referring provider: [name], MD Primary care provider: [name], MD  Chief Complaint: Other (Was in the hospital for pneumonia thought it was possibly due to the dupixent. 08/01/21 to 08/04/21. )  History of Present Illness: I had the pleasure of seeing Antonio Carlson for a follow up visit at the [facility] on 08/13/2021. He is a 76 y.o. male, who is being followed for nasal polyposis, AERD and heartburn. His previous allergy office visit was on 07/29/2021 with Dr. Maudie Mercury via telemedicine. Today is a new complaint visit of recent hospitalization and elevated eosinophils .  Patient received a total of 2 Dupixent injections - loading dose with 600mg  on 07/03/2021 and 2 weeks later patient self-injected at home. This helped with his nasal congestion/polyp symptoms however he noted some joint pains, back pain which prompted a televisit on 1/3.   Patient stopped the [place] but later he noted worsening low back pain with trouble breathing. CXR showed pleural effusion and was admitted to the hospital. Work up negative to covid/flu and effusion was negative for malignancy.   His back pain resolved after his hospitalization and his joint pains are much better.  Patient never had issues with pneumonia or pleural effusions before.  Patient is finishing up with his antibiotics this week. He noted some loose stools. Still having some coughing with clear phlegm.  Denies any rashes.  2 months ago had hand surgery and still receiving therapy. Only other change is that he had an increase in his cholesterol mediation Lipitor to 80mg  in August 2022.   While hospitalized it was noted that he had eosinophils of 2700 and recheck on 1/16 was 5100. Prior to that no eosinophil counts over 1000 noted in current EMR.  Nasal polyposis Sinuses doing well.  Not using any nasal sprays right  now.    Aspirin-exacerbated respiratory disease (AERD) Currently on Pulmicort 0.25mg  and perforomist twice a day. No albuterol use.   08/01/2021 hospitalization:  "I saw and examined the patient on the day of discharge. I reviewed and agree with the discharge summary written by the house staff.    Patient admitted with a moderate sized right pleural effusion, presumed to be parapneumonic although there was no clear consolidation on CXR. He had a mild leukocytosis but remained afebrile throughout his hospital course. He underwent IR thoracentesis this admission with 1L of hazy amber fluid removed. Lights criteria was borderline but technically positive for an exudative effusion based on fluid protein, LDH was not checked. Pleural fluid cultures were no growth (final). Cytology showed mixed acute and chronic inflammation, but no malignant cells. He was discharged with oral antibiotics to complete a 7 day course and asked to schedule follow up with his pulmonologist. "  Component     Latest Ref Rng & Units 07/01/2021 08/01/2021 08/03/2021 08/04/2021             WBC     4.0 - 10.5 K/uL 10.7 (H) 13.7 (H) 14.7 (H) 13.9 (H)  RBC     4.22 - 5.81 Mil/uL 5.58 5.15 4.97 4.82  Hemoglobin     13.0 - 17.0 g/dL 15.4 14.1 13.3 12.8 (L)  HCT     39.0 - 52.0 % 47.2 42.8 40.5 39.4  MCV     78.0 - 100.0 fl 84.6 83.1 81.5 81.7  MCH     26.0 -  34.0 pg  27.4 26.8 26.6  MCHC     30.0 - 36.0 g/dL 32.7 32.9 32.8 32.5  RDW     11.5 - 15.5 % 16.5 (H) 14.9 14.7 14.8  Platelets     150.0 - 400.0 K/uL 280.0 463 (H) 398 397  nRBC     0.0 - 0.2 %  0.0 0.0 0.0  Neutrophils     43.0 - 77.0 % 62.7 61    NEUT#     1.4 - 7.7 K/uL 6.7 8.4 (H)    Lymphocytes     12.0 - 46.0 % 22.1 14    Lymphocyte #     0.7 - 4.0 K/uL 2.4 1.9    Monocytes Relative     3.0 - 12.0 % 9.9 4    Monocyte #     0.1 - 1.0 K/uL 1.1 (H) 0.5    Eosinophil     0.0 - 5.0 % 4.5 20    Eosinophils Absolute     0.0 - 0.7 K/uL 0.5 2.7 (H)     Basophil     0.0 - 3.0 % 0.8 1    Basophils Absolute     0.0 - 0.1 K/uL 0.1 0.1    Immature Granulocytes     %      Abs Immature Granulocytes     0.00 - 0.07 K/uL  0.00     Component     Latest Ref Rng & Units 08/11/2021          WBC     4.0 - 10.5 K/uL 15.9 (H)  RBC     4.22 - 5.81 Mil/uL 5.20  Hemoglobin     13.0 - 17.0 g/dL 13.7  HCT     39.0 - 52.0 % 42.5  MCV     78.0 - 100.0 fl 81.8  MCH     26.0 - 34.0 pg   MCHC     30.0 - 36.0 g/dL 32.1  RDW     11.5 - 15.5 % 15.7 (H)  Platelets     150.0 - 400.0 K/uL 541.0 (H)  nRBC     0.0 - 0.2 %   Neutrophils     43.0 - 77.0 % 45.5  NEUT#     1.4 - 7.7 K/uL 7.2  Lymphocytes     12.0 - 46.0 % 12.2  Lymphocyte #     0.7 - 4.0 K/uL 1.9  Monocytes Relative     3.0 - 12.0 % 8.9  Monocyte #     0.1 - 1.0 K/uL 1.4 (H)  Eosinophil     0.0 - 5.0 % 32.4 (H)  Eosinophils Absolute     0.0 - 0.7 K/uL 5.1 (H)  Basophil     0.0 - 3.0 % 1.0  Basophils Absolute     0.0 - 0.1 K/uL 0.2 (H)  Immature Granulocytes     %   Abs Immature Granulocytes     0.00 - 0.07 K/uL     Assessment and Plan: Antonio Carlson is a 76 y.o. male with: Peripheral eosinophilia While hospitalized for the pleural effusion in January 2023 he had eosinophils of 2700 and recheck on 1/16 was 5100. Prior to that no eosinophil counts over 1000 noted in current EMR. Discussed with patient at length that there can be a transient elevation of eosinophils in patients who take Dupixent and it typically peaks 16-20 weeks after starting the injection and decreases back to baseline by 28-52 weeks.  In his scenario, I'm not sure how much of his elevated eosinophils are due to the [place] as he only received 2 doses. There has been a case of patient who developed eosinophilic pleural effusion while on Dupixent but in his case there was only 6% eosinophils in his effusion.  Nevertheless, patient and I decided to stop Dupixent for now. He is not having any other clinical  symptoms and he is actually feeling better since discharge. Will monitor eosinophil counts - recheck next week. If still elevated or developing new symptoms may need to do HES work up and start on systemic steroids.   Nasal polyposis Past history - Perennial rhinitis symptoms for 10 years.  Patient had 2 nasal polypectomies - one in the 1990s and the other in 2011.  Patient requires steroid taper every few months for his significant nasal congestion.  Tried Flonase with no benefit. Past medical history significant for asthma and NSAID allergy. 2022 skin testing showed: Negative to indoor/outdoor allergens. Interim history - congestion is minimal and no daily nasal spray use. Noted polyp on right side on exam today. Stop Dupixent and monitor your symptoms. May use Flonase (fluticasone) nasal spray 1 spray per nostril twice a day as needed for nasal congestion.  There are other biologics (Xolair and Nucala) available for nasal polyps that we may try in the future that has a different MOA than Dupixent.   Aspirin-exacerbated respiratory disease (AERD) Past history - Followed by pulmonology.  Currently on Pulmicort 0.25 mg nebulizer twice a day and Perforomist nebulizer twice a day.  Patient states that handheld inhalers did not work for him. 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  Clinically feeling unchanged. interim history - breathing much better since he had the pleural effusion drained.  Continue nebulizers as per pulmonology - next appointment on February 9th.  Daily controller medication(s): continue Pulmicort and perforomist nebulizer twice a day. May use albuterol rescue inhaler 2 puffs every 4 to 6 hours as needed for shortness of breath, chest tightness, coughing, and wheezing. May use albuterol rescue inhaler 2 puffs 5 to 15 minutes prior to strenuous physical activities. Monitor frequency of use.   Continue to avoid NSADs - no Advil, ibuprofen, aleve. May take tylenol for pain.  Heartburn Continue omeprazole 20mg  daily as prescribed. Nothing to eat or drink for 30 minutes afterwards.  Return in about 4 weeks (around 09/10/2021).  No orders of the defined types were placed in this encounter.  Lab Orders         CBC with Differential/Platelet      Diagnostics: None.    Medication List:  Current Outpatient Medications  Medication Sig Dispense Refill   acetaminophen (TYLENOL) 500 MG tablet Take 500-1,000 mg by mouth every 6 (six) hours as needed for moderate pain or headache.     atorvastatin (LIPITOR) 80 MG tablet Take 1 tablet (80 mg total) by mouth daily. TAKE 1 TABLET BY MOUTH EVERY DAY AT 6PM (Patient taking differently: Take 80 mg by mouth every evening.) 90 tablet 3   budesonide (PULMICORT) 0.25 MG/2ML nebulizer solution USE 1 VIAL IN NEBULIZER TWICE DAILY (Patient taking differently: Take 0.25 mg by nebulization in the morning and at bedtime.) 120 mL 11   CALCIUM CITRATE-VITAMIN D PO Chew 1 tablet by mouth daily.     cefdinir (OMNICEF) 300 MG capsule Take 1 capsule (300 mg total) by mouth 2 (two) times daily for 10 days. 20 capsule  0   clopidogrel (PLAVIX) 75 MG tablet TAKE 1 TABLET BY MOUTH EVERY DAY (Patient taking differently: Take 75 mg by mouth daily.) 90 tablet 3   formoterol (PERFOROMIST) 20 MCG/2ML nebulizer solution Take 2 mLs (20 mcg total) by nebulization 2 (two) times daily. 120 mL 6   metoprolol tartrate (LOPRESSOR) 50 MG tablet TAKE 1/2 TABLET BY MOUTH TWICE A DAY (Patient taking differently: Take 25 mg by mouth 2 (two) times daily.) 90 tablet 1   metroNIDAZOLE (FLAGYL) 500 MG tablet Take 1 tablet (500 mg total) by mouth 3 (three) times daily for 10 days. 30 tablet 0   montelukast (SINGULAIR) 10 MG tablet Take 1 tablet (10 mg total) by mouth at bedtime. 90 tablet 4   Multiple Vitamins-Minerals (CENTRUM) tablet Take 1 tablet by mouth daily.     omeprazole  (PRILOSEC) 20 MG capsule Take 1 capsule (20 mg total) by mouth daily. 90 capsule 1   telmisartan (MICARDIS) 80 MG tablet Take 1 tablet (80 mg total) by mouth daily. 90 tablet 3   albuterol (VENTOLIN HFA) 108 (90 Base) MCG/ACT inhaler Inhale 2 puffs into the lungs every 6 (six) hours as needed for wheezing or shortness of breath. (Patient not taking: Reported on 08/13/2021)     predniSONE (DELTASONE) 10 MG tablet prednisone 10 mg tablet  PLEASE SEE ATTACHED FOR DETAILED DIRECTIONS     No current facility-administered medications for this visit.   Allergies: Allergies  Allergen Reactions   Aspirin Shortness Of Breath   Amoxicillin     GI upset Did it involve swelling of the face/tongue/throat, SOB, or low BP? No Did it involve sudden or severe rash/hives, skin peeling, or any reaction on the inside of your mouth or nose? No Did you need to seek medical attention at a hospital or doctor's office? No When did it last happen?    20+ years   If all above answers are "NO", may proceed with cephalosporin use.    Sulfonamide Derivatives Other (See Comments)    Doesn't remember   I reviewed his past medical history, social history, family history, and environmental history and no significant changes have been reported from his previous visit.  Review of Systems  Constitutional:  Negative for appetite change, chills, fever and unexpected weight change.  HENT:  Negative for congestion and rhinorrhea.   Eyes:  Negative for itching.  Respiratory:  Positive for cough. Negative for chest tightness, shortness of breath and wheezing.   Cardiovascular:  Negative for chest pain.  Gastrointestinal:  Negative for abdominal pain.       Loose stools  Genitourinary:  Negative for difficulty urinating.  Musculoskeletal:  Negative for back pain and joint swelling.  Skin:  Negative for rash.  Allergic/Immunologic: Negative for environmental allergies.   Objective: BP 140/82    Pulse 72    Temp 98.1 F  (36.7 C)    Resp 16    Ht 5\' 9"  (1.753 m)    Wt 176 lb 5.9 oz (80 kg)    SpO2 95%    BMI 26.05 kg/m  Body mass index is 26.05 kg/m. Physical Exam Vitals and nursing note reviewed.  Constitutional:      Appearance: Normal appearance. He is well-developed.  HENT:     Head: Normocephalic and atraumatic.     Right Ear: Tympanic membrane and external ear normal.     Left Ear: Tympanic membrane and external ear normal.     Nose:     Comments: Right polyp  Mouth/Throat:     Mouth: Mucous membranes are moist.     Pharynx: Oropharynx is clear.  Eyes:     Conjunctiva/sclera: Conjunctivae normal.  Cardiovascular:     Rate and Rhythm: Normal rate and regular rhythm.     Heart sounds: Normal heart sounds. No murmur heard.   No friction rub. No gallop.  Pulmonary:     Effort: Pulmonary effort is normal.     Breath sounds: Normal breath sounds. No wheezing, rhonchi or rales.  Musculoskeletal:     Cervical back: Neck supple.  Skin:    General: Skin is warm.     Findings: No rash.  Neurological:     Mental Status: He is alert and oriented to person, place, and time.  Psychiatric:        Behavior: Behavior normal.   Previous notes and tests were reviewed. The plan was reviewed with the patient/family, and all questions/concerned were addressed.  It was my pleasure to see Antonio Carlson today and participate in his care. Please feel free to contact me with any questions or concerns.  Sincerely,  Rexene Alberts, DO Allergy & Immunology  [facility] office: [place] office: [phone]

## 2021-08-13 ENCOUNTER — Other Ambulatory Visit: Payer: Self-pay

## 2021-08-13 ENCOUNTER — Encounter: Payer: Self-pay | Admitting: Allergy

## 2021-08-13 ENCOUNTER — Other Ambulatory Visit: Payer: Self-pay | Admitting: *Deleted

## 2021-08-13 ENCOUNTER — Ambulatory Visit: Payer: Medicare Other

## 2021-08-13 ENCOUNTER — Ambulatory Visit (INDEPENDENT_AMBULATORY_CARE_PROVIDER_SITE_OTHER): Payer: Medicare Other | Admitting: Allergy

## 2021-08-13 VITALS — BP 140/82 | HR 72 | Temp 98.1°F | Resp 16 | Ht 69.0 in | Wt 176.4 lb

## 2021-08-13 DIAGNOSIS — Z886 Allergy status to analgesic agent status: Secondary | ICD-10-CM

## 2021-08-13 DIAGNOSIS — D7219 Other eosinophilia: Secondary | ICD-10-CM | POA: Diagnosis not present

## 2021-08-13 DIAGNOSIS — J90 Pleural effusion, not elsewhere classified: Secondary | ICD-10-CM

## 2021-08-13 DIAGNOSIS — R12 Heartburn: Secondary | ICD-10-CM | POA: Diagnosis not present

## 2021-08-13 DIAGNOSIS — J45909 Unspecified asthma, uncomplicated: Secondary | ICD-10-CM

## 2021-08-13 DIAGNOSIS — J339 Nasal polyp, unspecified: Secondary | ICD-10-CM | POA: Diagnosis not present

## 2021-08-13 NOTE — Assessment & Plan Note (Addendum)
While hospitalized for the pleural effusion in January 2023 he had eosinophils of 2700 and recheck on 1/16 was 5100. Prior to that no eosinophil counts over 1000 noted in current EMR.  Discussed with patient at length that there can be a transient elevation of eosinophils in patients who take Dupixent and it typically peaks 16-20 weeks after starting the injection and decreases back to baseline by 28-52 weeks.   In his scenario, I'm not sure how much of his elevated eosinophils are due to the [place] as he only received 2 doses. There has been a case of patient who developed eosinophilic pleural effusion while on Dupixent but in his case there was only 6% eosinophils in his effusion.   Nevertheless, patient and I decided to stop Dupixent for now.  He is not having any other clinical symptoms and he is actually feeling better since discharge.  Will monitor eosinophil counts - recheck next week.  If still elevated or developing new symptoms may need to do HES work up and start on systemic steroids.

## 2021-08-13 NOTE — Assessment & Plan Note (Addendum)
Past history - Followed by pulmonology.  Currently on Pulmicort 0.25 mg nebulizer twice a day and Perforomist nebulizer twice a day.  Patient states that handheld inhalers did not work for him. 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  Clinically feeling unchanged. interim history - breathing much better since he had the pleural effusion drained.   Continue nebulizers as per pulmonology - next appointment on February 9th.   Daily controller medication(s): continue Pulmicort and perforomist nebulizer twice a day.  May use albuterol rescue inhaler 2 puffs every 4 to 6 hours as needed for shortness of breath, chest tightness, coughing, and wheezing. May use albuterol rescue inhaler 2 puffs 5 to 15 minutes prior to strenuous physical activities. Monitor frequency of use.   Continue to avoid NSADs - no Advil, ibuprofen, aleve.  May take tylenol for pain.

## 2021-08-13 NOTE — Assessment & Plan Note (Signed)
·   Continue omeprazole 20mg  daily as prescribed. Nothing to eat or drink for 30 minutes afterwards.

## 2021-08-13 NOTE — Patient Instructions (Addendum)
Elevated eosinophils If you start feeling any new symptoms call us and let us know. Get bloodwork on Monday (anytime between 9AM and noon or 1:30PM to 4PM)  Sinus/polyps Stop Dupixent and monitor your symptoms. May use Flonase (fluticasone) nasal spray 1 spray per nostril twice a day as needed for nasal congestion.   Asthma:  Continue nebulizers as per pulmonology - next appointment on February 9th.  Daily controller medication(s): continue Pulmicort and perforomist nebulizer twice a day. May use albuterol rescue inhaler 2 puffs every 4 to 6 hours as needed for shortness of breath, chest tightness, coughing, and wheezing. May use albuterol rescue inhaler 2 puffs 5 to 15 minutes prior to strenuous physical activities. Monitor frequency of use.  Asthma control goals:  Full participation in all desired activities (may need albuterol before activity) Albuterol use two times or less a week on average (not counting use with activity) Cough interfering with sleep two times or less a month Oral steroids no more than once a year No hospitalizations   Drug allergies Continue to avoid NSADs - no Advil, ibuprofen, aleve. May take tylenol for pain.  Heartburn: Continue omeprazole 20mg  daily as prescribed. Nothing to eat or drink for 30 minutes afterwards.  Follow up in 4 weeks or sooner if needed.

## 2021-08-13 NOTE — Assessment & Plan Note (Signed)
Past history - Perennial rhinitis symptoms for 10 years.  Patient had 2 nasal polypectomies - one in the 1990s and the other in 2011.  Patient requires steroid taper every few months for his significant nasal congestion.  Tried Flonase with no benefit. Past medical history significant for asthma and NSAID allergy. 2022 skin testing showed: Negative to indoor/outdoor allergens. Interim history - congestion is minimal and no daily nasal spray use. Noted polyp on right side on exam today.  Stop Dupixent and monitor your symptoms.  May use Flonase (fluticasone) nasal spray 1 spray per nostril twice a day as needed for nasal congestion.   There are other biologics (Xolair and Nucala) available for nasal polyps that we may try in the future that has a different MOA than Dupixent.

## 2021-08-13 NOTE — Progress Notes (Signed)
ATC x1.  LVM to return call. 

## 2021-08-18 ENCOUNTER — Encounter: Payer: Self-pay | Admitting: *Deleted

## 2021-08-18 ENCOUNTER — Telehealth: Payer: Self-pay | Admitting: Internal Medicine

## 2021-08-18 DIAGNOSIS — D7219 Other eosinophilia: Secondary | ICD-10-CM | POA: Diagnosis not present

## 2021-08-18 NOTE — Progress Notes (Signed)
ATC x2.  LVM to return call.  Unable to reach letter sent.

## 2021-08-19 ENCOUNTER — Telehealth: Payer: Self-pay | Admitting: Allergy

## 2021-08-19 DIAGNOSIS — D7219 Other eosinophilia: Secondary | ICD-10-CM

## 2021-08-19 DIAGNOSIS — M25641 Stiffness of right hand, not elsewhere classified: Secondary | ICD-10-CM | POA: Diagnosis not present

## 2021-08-19 LAB — CBC WITH DIFFERENTIAL/PLATELET
Basophils Absolute: 0.2 10*3/uL (ref 0.0–0.2)
Basos: 1 %
EOS (ABSOLUTE): 3.8 10*3/uL — ABNORMAL HIGH (ref 0.0–0.4)
Eos: 27 %
Hematocrit: 45.8 % (ref 37.5–51.0)
Hemoglobin: 14.8 g/dL (ref 13.0–17.7)
Immature Grans (Abs): 0 10*3/uL (ref 0.0–0.1)
Immature Granulocytes: 0 %
Lymphocytes Absolute: 2 10*3/uL (ref 0.7–3.1)
Lymphs: 14 %
MCH: 26.7 pg (ref 26.6–33.0)
MCHC: 32.3 g/dL (ref 31.5–35.7)
MCV: 83 fL (ref 79–97)
Monocytes Absolute: 1.1 10*3/uL — ABNORMAL HIGH (ref 0.1–0.9)
Monocytes: 8 %
Neutrophils Absolute: 6.9 10*3/uL (ref 1.4–7.0)
Neutrophils: 50 %
Platelets: 544 10*3/uL — ABNORMAL HIGH (ref 150–450)
RBC: 5.54 x10E6/uL (ref 4.14–5.80)
RDW: 14.3 % (ref 11.6–15.4)
WBC: 14 10*3/uL — ABNORMAL HIGH (ref 3.4–10.8)

## 2021-08-19 NOTE — Telephone Encounter (Signed)
Spoke with patient. Eos are trending down and patient feeling well. Will re-check in 2 weeks. No other work up needed at this time.  Put in order for cbc diff for 2 weeks - patient will come to our [place] office to get it drawn.

## 2021-08-19 NOTE — Telephone Encounter (Signed)
Looked in patient chart and it was noted that patient was called three times by [facility] and left voicemails. And letter was sent yesterday. Called and left voicemail for patient and left voicemail for patient that letter was sent out with results and for him to call us back with questions. Nothing further needed

## 2021-08-25 ENCOUNTER — Telehealth: Payer: Self-pay | Admitting: Internal Medicine

## 2021-08-25 DIAGNOSIS — J90 Pleural effusion, not elsewhere classified: Secondary | ICD-10-CM

## 2021-08-25 NOTE — Telephone Encounter (Signed)
Melvenia Needles, NP  08/12/2021  3:31 PM EST     Chest x-ray shows a small right pleural effusion.  Patient is clinically improved and will continue to follow.  Patient has a follow-up visit in 2 weeks in our office we will repeat chest x-ray at that time  Patient is aware of results and voiced his understanding.  CXR ordered. He will have CXR prior to 09/04/2021 visit. Nothing further needed at this time.

## 2021-08-27 ENCOUNTER — Telehealth: Payer: Self-pay | Admitting: Internal Medicine

## 2021-08-27 NOTE — Telephone Encounter (Signed)
Received fax from [facility] that patient is on CLOPIDOGREL and OMEPRAZOLE (rx'ed by pulmonary)  Will route to MD to advise if OK to take concurrently given concerns for reduced plasma concentrations of clopidogrel and reduced antiplatelet activity

## 2021-08-27 NOTE — Telephone Encounter (Signed)
Would change omeprazole to pantoprazole 40 mg daily (no interaction).  Dr Lemmie Evens

## 2021-08-29 DIAGNOSIS — M25641 Stiffness of right hand, not elsewhere classified: Secondary | ICD-10-CM | POA: Diagnosis not present

## 2021-08-29 MED ORDER — PANTOPRAZOLE SODIUM 40 MG PO TBEC: 40.0000 mg | DELAYED_RELEASE_TABLET | Freq: Every day | ORAL | 8 refills | Status: DC

## 2021-08-29 NOTE — Telephone Encounter (Signed)
Patient called w/med change per MD and explanation why  He will check with [facility] on cost Rx(s) sent to pharmacy electronically.

## 2021-09-04 ENCOUNTER — Ambulatory Visit: Payer: Medicare Other | Admitting: Internal Medicine

## 2021-09-04 DIAGNOSIS — M25641 Stiffness of right hand, not elsewhere classified: Secondary | ICD-10-CM | POA: Diagnosis not present

## 2021-09-09 DIAGNOSIS — M25641 Stiffness of right hand, not elsewhere classified: Secondary | ICD-10-CM | POA: Diagnosis not present

## 2021-09-18 DIAGNOSIS — M25641 Stiffness of right hand, not elsewhere classified: Secondary | ICD-10-CM | POA: Diagnosis not present

## 2021-09-23 ENCOUNTER — Other Ambulatory Visit: Payer: Self-pay

## 2021-09-23 ENCOUNTER — Ambulatory Visit (INDEPENDENT_AMBULATORY_CARE_PROVIDER_SITE_OTHER): Payer: Medicare Other

## 2021-09-23 ENCOUNTER — Encounter: Payer: Self-pay | Admitting: Internal Medicine

## 2021-09-23 ENCOUNTER — Ambulatory Visit (INDEPENDENT_AMBULATORY_CARE_PROVIDER_SITE_OTHER): Payer: Medicare Other | Admitting: Internal Medicine

## 2021-09-23 DIAGNOSIS — Z886 Allergy status to analgesic agent status: Secondary | ICD-10-CM

## 2021-09-23 DIAGNOSIS — J45909 Unspecified asthma, uncomplicated: Secondary | ICD-10-CM | POA: Diagnosis not present

## 2021-09-23 DIAGNOSIS — E785 Hyperlipidemia, unspecified: Secondary | ICD-10-CM

## 2021-09-23 DIAGNOSIS — J90 Pleural effusion, not elsewhere classified: Secondary | ICD-10-CM | POA: Diagnosis not present

## 2021-09-23 DIAGNOSIS — J339 Nasal polyp, unspecified: Secondary | ICD-10-CM | POA: Diagnosis not present

## 2021-09-23 NOTE — Patient Instructions (Signed)
I will be referring you to [facility]  - call me with any internal medicine issues in the meantime   Pulmonary follow up is as needed

## 2021-09-23 NOTE — Progress Notes (Signed)
Subjective:    Patient ID: Antonio Carlson, male    DOB: 12-20-1945   MRN: [id number]  Brief patient profile:  15 yowm quit smoking in 1971 with history of Triad Asthma and hyperlipidemia and hbp    Brief patient profile:  06/23/2016 NP  Follow up : HTN, Hyperlipidemia/Triad Asthma  Pt returns for 3 month follow up and med review .  We reviewed all his meds and organized them into a med calendar w/ pt education . He appears to be taking correctly except for flonase , has not been using on regular basis.  Complains of 1 week of nasal congestion , drainage, stuffiness, sneezing, and ear fullness. No fever or sinus pain. Minimal cough .   rec Prednisone taper as directed.  Follow med calendar closely and bring to each visit.  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  1st of May 2020/ hbp/ hyperlipidemia   Cc new chest burning with exertion new since Jan 2020   Dyspnea:  Not limited by breathing from desired activities  But slows down more than used to - avoid fast x 5 m is limit = MMRC1 = can walk nl pace, flat grade, can't hurry or go uphills or steps s sob   Cough: none  Sleeping: able to lie flat / on pillow SABA use: rarely rec [facility] will contact you for cardiology referral for new exercise related chest discomfort  Please remember to go to the lab and x-ray department   for your tests - we will call you with the results when they are available.   Please schedule a follow up visit in 3 months but call sooner if needed  - consider refer for Dupixent if freq flares needing prednisone        10/09/2019  f/u ov/Antonio Carlson re: triad asthma/ last pred Dec 2020 for back not breathing  Chief Complaint  Patient presents with   Follow-up    Breathing is doing well and he has not been using his albuterol. He has occ cough that he related to sinus drainage. Also c/o nasal congestion.   Dyspnea:  Nl pace flat surface fine = MMRC1 = can walk nl pace, flat  grade, can't hurry or go uphills or steps s sob   -walking up to 30 min daily  Cough: assoc with nasal congestion/some slt discolored mucus  Sleeping: able to lie flat/one pillow  SABA use: very rarely  02: none  rec Plan A = Automatic = Always=    Budesonide/ Performist twice daily per nebulizer  Plan B = Backup (to supplement plan A, not to replace it) Only use your albuterol(yellow is proventil)  inhaler Plan C = Crisis - if Plan B stops working or you need more of it than usual - Prednisone 10 mg take  4 each am x 2 days,   2 each am x 2 days,  1 each am x 2 days and stop  Stop zestoril = lisinopril  Start micardis (telmasartan) 80 mg one half daily if blood pressure not at goal take a whole pill daily  See [name] w/in 3 months with all your medications,  Move up [name] f/u to 2 weeks to recheck K and add hctz if elevated (eg micardis 40-12.5)    01/01/2020  f/u ov/Antonio Carlson re: triad asthma - using pred approx 5 cycles  x a year, last done one week prior to [place]  Patient presents with   Follow-up    2 month f/u for asthma. States his breathing has been great since last visit. Denies any current issues.   Dyspnea:  Not limited by breathing from desired activities  /  Cough: just with pnds  Sleeping: flat bed / one pillow SABA use: none  02: none  rec We will see if your are eligbile for Dupixent and call to arrange a trial or refer you to [facility] who will likely do the same> did not happen    07/01/2020  f/u ov/Antonio Carlson re:  Triad asthma on performist and budesonide per part B free / singulair also  Chief Complaint  Patient presents with   Follow-up    sinus drainage making him cough   Dyspnea:  Not limited by breathing from desired activities  - yardwork / hunting  Cough: worse x 1 weeks / min clear Sleeping: bothered by nasal congestion/ drainage /  SABA use: none but prednisone 1st of nov 2021 and before that in sept  02: none   rec We will be referring you  today to Dr [name] team  Re options for biologics for your nasal symptoms and allergies In meantime go ahead and take the prednisone but try to hold if for at least a week before you see allergy Please remember to go to the lab department   for your tests - we will call you with the results when they are available. Please schedule a follow up visit in 6  months but call sooner if needed    12/30/2020  f/u ov/Antonio Carlson re: Triad asthma maint on perforomist / budesonide no prednisone x one month  > worse nasal congestion Chief Complaint  Patient presents with   Follow-up    Pt c/o cough x 2 wks- prod with thick, clear sputum. He rarely uses his albuterol. He states today his "head feels stopped up".    Dyspnea:  Not limited by breathing from desired activities  / some L hip problems  Cough: assoc with pnds worse again  Sleeping: ok flat/ has electric bed  SABA use: rarely  02: none  Covid status:   X 3   Rec We will be referring you to Dr [name] service with ? Need for dupixent or alternative for your nasal symptoms       09/23/2021  f/u ov/Antonio Carlson re: triad asthma maint on pulmocort/performist   off pred x months, nasal symptoms had improved p starting dupixent 12/8/2 but only received 2 infusions the ? Adverse drug effects = arthralgias and rx stopped but nasal symptoms worsened since then   Chief Complaint  Patient presents with   Follow-up    Follow up. Patient says everything is going good.    Dyspnea:  Not limited by breathing from desired activities   Cough: better but some sneezing / nasal congestion since  off dupixent Sleeping: flat bed  SABA use: none  02: none  Covid status:   covid x 3    No obvious day to day or daytime variability or assoc excess/ purulent sputum or mucus plugs or hemoptysis or cp or chest tightness, subjective wheeze or overt sinus or hb symptoms.   Sleeping as above  without nocturnal  or early am exacerbation  of respiratory  c/o's or need for noct  saba. Also denies any obvious fluctuation of symptoms with weather or environmental changes or other aggravating or alleviating factors except as outlined above   No unusual exposure hx or  h/o childhood pna/ asthma or knowledge of premature birth.  Current Allergies, Complete Past Medical History, Past Surgical History, Family History, and Social History were reviewed in [employer] record.  ROS  The following are not active complaints unless bolded Hoarseness, sore throat, dysphagia, dental problems, itching, sneezing,  nasal congestion or discharge of excess mucus or purulent secretions, ear ache,   fever, chills, sweats, unintended wt loss or wt gain, classically pleuritic or exertional cp,  orthopnea pnd or arm/hand swelling  or leg swelling, presyncope, palpitations, abdominal pain, anorexia, nausea, vomiting, diarrhea  or change in bowel habits or change in bladder habits, change in stools or change in urine, dysuria, hematuria,  rash, arthralgias, visual complaints, headache, numbness, weakness or ataxia or problems with walking or coordination,  change in mood or  memory.        Current Meds  Medication Sig   acetaminophen (TYLENOL) 500 MG tablet Take 500-1,000 mg by mouth every 6 (six) hours as needed for moderate pain or headache.   atorvastatin (LIPITOR) 80 MG tablet Take 1 tablet (80 mg total) by mouth daily. TAKE 1 TABLET BY MOUTH EVERY DAY AT 6PM (Patient taking differently: Take 80 mg by mouth every evening.)   budesonide (PULMICORT) 0.25 MG/2ML nebulizer solution USE 1 VIAL IN NEBULIZER TWICE DAILY (Patient taking differently: Take 0.25 mg by nebulization in the morning and at bedtime.)   CALCIUM CITRATE-VITAMIN D PO Chew 1 tablet by mouth daily.   clopidogrel (PLAVIX) 75 MG tablet TAKE 1 TABLET BY MOUTH EVERY DAY (Patient taking differently: Take 75 mg by mouth daily.)   formoterol (PERFOROMIST) 20 MCG/2ML nebulizer solution Take 2 mLs (20 mcg total) by  nebulization 2 (two) times daily.   metoprolol tartrate (LOPRESSOR) 50 MG tablet TAKE 1/2 TABLET BY MOUTH TWICE A DAY (Patient taking differently: Take 25 mg by mouth 2 (two) times daily.)   montelukast (SINGULAIR) 10 MG tablet Take 1 tablet (10 mg total) by mouth at bedtime.   Multiple Vitamins-Minerals (CENTRUM) tablet Take 1 tablet by mouth daily.   pantoprazole (PROTONIX) 40 MG tablet Take 1 tablet (40 mg total) by mouth daily.        telmisartan (MICARDIS) 80 MG tablet Take 1 tablet (80 mg total) by mouth daily.            PMH PVCs.  Syncope  - Holter ordered June 27, 2008 > nl  - EP consult June 27, 2008  OSTEOPENIA (ICD-733.90)  COLONIC POLYPS (ICD-211.3) .......................................[name] Kitchen [name] - see colonoscopy 11/07/10 (repeat in 10 yr)  DIVERTICULOSIS, MILD (ICD-562.10) ASTHMA (ICD-493.90)  - HFA 90% June 11, 2009  ALLERGIC RHINITIS, CHRONIC (ICD-477.9)  - steroid dep until mid oct 2009  - Allergy profile sent January 23, 2010 >> IgE 18.3  - Polypectomy Sept 2012 ......................................................  [name]/ [place]  BENIGN PROSTATIC HYPERTROPHY, HX OF (ICD-V13.8) .[name] Kitchen... 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  Bonnie.......................................................[name] Kitchen referred to [facility] 09/23/2021  R C6/7 radiculopathy 2015 .....................................................[name] KitchenTrenton Gammon  - Pneumovax 11/2004 and @ age 57 June 11, 2009 and Prevnar 13 08/14/2013  - Td 07/2005   And  08/01/2015  - 2nd Covid May 2021        Past Surgical History:  Appendectomy  Colonoscopy     Family History:  heart disease in his father onset at age 71 he was a smoker  Ca brain half brother  75 siblings  -  one lung ca, one breast ca, one brain ca Mother dementia  onset late 7s lived to be 27    Social History:  quit smoking 1971  rarely drink alcohol  Retired  [employer] 2018  - remarried sept 2020        Objective:   Physical Exam  09/23/2021   175  12/30/2020     172 07/01/2020   177 01/01/2020      175  10/09/2019    170   03/16/2019   179  wt 198 May 17, 2008 >  > 192 07/17/11 >  02/05/2012  191> 05/10/2012  188> 08/10/2012 190> 11/14/2012 180>182 12/12/2012 > 03/14/2013 182 > 07/06/2013 185 > 08/14/2013  184 > 01/31/2014  176 >176 .03/22/2014 >  05/31/2014   181 > 06/11/14  179 > 08/20/2014   184> 02/18/2015  181 > 08/01/2015  178 > 09/16/2015 182  > 12/16/2015   185 > 03/17/2016  185> 08/07/2016   187  > 10/22/2016    183 > 01/22/2017   180> 07/29/2017   177 > 10/28/2017  169 >  06/16/2018  175 > 12/15/2018  182 >  Vital signs reviewed  09/23/2021  - Note at rest 02 sats  99% on RA   General appearance:    healthy appearing amb wm nad    HEENT : pt wearing mask not removed for exam due to covid -19 concerns.    NECK :  without JVD/Nodes/TM/ nl carotid upstrokes bilaterally   LUNGS: no acc muscle use,  Nl contour chest which is clear to A and P bilaterally without cough on insp or exp maneuvers   CV:  RRR  no s3 or murmur or increase in P2, and no edema   ABD:  soft and nontender with nl inspiratory excursion in the supine position. No bruits or organomegaly appreciated, bowel sounds nl  MS:  Nl gait/ ext warm without deformities, calf tenderness, cyanosis or clubbing No obvious joint restrictions   SKIN: warm and dry without lesions    NEURO:  alert, approp, nl sensorium with  no motor or cerebellar deficits apparent.

## 2021-09-24 ENCOUNTER — Encounter: Payer: Self-pay | Admitting: Internal Medicine

## 2021-09-24 NOTE — Assessment & Plan Note (Signed)
Target LDL  < 70 pcabg ? ?Referred to [facility] for PCP  ?

## 2021-09-24 NOTE — Assessment & Plan Note (Addendum)
Quit smoking 1971  ?- PFT's  09/16/2015  FEV1 1.81 (61 % ) ratio 56  p 44 % improvement from saba with DLCO  81/81c % corrects to 107  % for alv volume  Done prior to any am meds  ?- 06/16/2018  After extensive coaching inhaler device,  effectiveness =    90%  ?- 10/2018 rx prednisone as plan C x 6 days ?- Allergy profile 10/09/19   >  Eos 0.9 /  IgE  106 ?- 01/01/2020 submitted paperwork for dupixent> did not go thru > referred to Dr Neldon Mc 07/01/2020  ?- 12/30/2020 referred again to Dr [name] service  ?- improved p starting dupixent 07/03/21 but only received 2 infusions the ? Adverse drug effects = arthralgias ? ? ?Asthma component of the problem is doing much better > f/u with Dr Maudie Mercury [name] service re considering other biologics  ? ?F/u in pulmonary can be prn going forward for asthma flares which in the past had required freq steroid rx though this has not been needed in the last few months. ? ?    ?  ? ?Each maintenance medication was reviewed in detail including emphasizing most importantly the difference between maintenance and prns and under what circumstances the prns are to be triggered using an action plan format where appropriate. ? ?Total time for H and P, chart review, counseling, reviewing hfa  device(s) and generating customized AVS unique to this office visit / same day charting =  23 min  ?     ?

## 2021-10-01 DIAGNOSIS — Z4789 Encounter for other orthopedic aftercare: Secondary | ICD-10-CM | POA: Diagnosis not present

## 2021-10-08 ENCOUNTER — Other Ambulatory Visit: Payer: Self-pay | Admitting: Internal Medicine

## 2021-10-17 NOTE — Telephone Encounter (Signed)
Error

## 2021-10-22 DIAGNOSIS — N401 Enlarged prostate with lower urinary tract symptoms: Secondary | ICD-10-CM | POA: Diagnosis not present

## 2021-10-22 DIAGNOSIS — R3915 Urgency of urination: Secondary | ICD-10-CM | POA: Diagnosis not present

## 2021-10-22 DIAGNOSIS — R351 Nocturia: Secondary | ICD-10-CM | POA: Diagnosis not present

## 2021-10-22 DIAGNOSIS — N5201 Erectile dysfunction due to arterial insufficiency: Secondary | ICD-10-CM | POA: Diagnosis not present

## 2021-10-29 DIAGNOSIS — M25552 Pain in left hip: Secondary | ICD-10-CM | POA: Diagnosis not present

## 2021-11-01 DIAGNOSIS — U071 COVID-19: Secondary | ICD-10-CM | POA: Diagnosis not present

## 2021-11-03 DIAGNOSIS — M25552 Pain in left hip: Secondary | ICD-10-CM | POA: Diagnosis not present

## 2021-11-12 ENCOUNTER — Other Ambulatory Visit: Payer: Self-pay | Admitting: Internal Medicine

## 2021-11-20 ENCOUNTER — Other Ambulatory Visit: Payer: Self-pay | Admitting: Internal Medicine

## 2021-11-24 ENCOUNTER — Ambulatory Visit (INDEPENDENT_AMBULATORY_CARE_PROVIDER_SITE_OTHER): Payer: Medicare Other | Admitting: Internal Medicine

## 2021-11-24 ENCOUNTER — Encounter: Payer: Self-pay | Admitting: Internal Medicine

## 2021-11-24 VITALS — BP 138/88 | HR 63 | Resp 18 | Ht 68.0 in | Wt 176.0 lb

## 2021-11-24 DIAGNOSIS — J45909 Unspecified asthma, uncomplicated: Secondary | ICD-10-CM | POA: Diagnosis not present

## 2021-11-24 DIAGNOSIS — R7303 Prediabetes: Secondary | ICD-10-CM

## 2021-11-24 DIAGNOSIS — Z886 Allergy status to analgesic agent status: Secondary | ICD-10-CM

## 2021-11-24 DIAGNOSIS — E782 Mixed hyperlipidemia: Secondary | ICD-10-CM | POA: Diagnosis not present

## 2021-11-24 DIAGNOSIS — I25118 Atherosclerotic heart disease of native coronary artery with other forms of angina pectoris: Secondary | ICD-10-CM

## 2021-11-24 DIAGNOSIS — G5601 Carpal tunnel syndrome, right upper limb: Secondary | ICD-10-CM

## 2021-11-24 DIAGNOSIS — L304 Erythema intertrigo: Secondary | ICD-10-CM

## 2021-11-24 DIAGNOSIS — I10 Essential (primary) hypertension: Secondary | ICD-10-CM

## 2021-11-24 DIAGNOSIS — Z951 Presence of aortocoronary bypass graft: Secondary | ICD-10-CM | POA: Diagnosis not present

## 2021-11-24 DIAGNOSIS — E559 Vitamin D deficiency, unspecified: Secondary | ICD-10-CM | POA: Diagnosis not present

## 2021-11-24 DIAGNOSIS — N401 Enlarged prostate with lower urinary tract symptoms: Secondary | ICD-10-CM

## 2021-11-24 DIAGNOSIS — Z1159 Encounter for screening for other viral diseases: Secondary | ICD-10-CM

## 2021-11-24 DIAGNOSIS — J339 Nasal polyp, unspecified: Secondary | ICD-10-CM

## 2021-11-24 DIAGNOSIS — R351 Nocturia: Secondary | ICD-10-CM

## 2021-11-24 DIAGNOSIS — Z125 Encounter for screening for malignant neoplasm of prostate: Secondary | ICD-10-CM | POA: Diagnosis not present

## 2021-11-24 DIAGNOSIS — M1610 Unilateral primary osteoarthritis, unspecified hip: Secondary | ICD-10-CM

## 2021-11-24 MED ORDER — KETOCONAZOLE 2 % EX CREA: 1.0000 "application " | TOPICAL_CREAM | Freq: Every day | CUTANEOUS | 0 refills | Status: DC

## 2021-11-24 NOTE — Patient Instructions (Signed)
Please continue taking medications as prescribed.  Please continue to follow low salt diet and ambulate as tolerated.  Please get fasting blood tests done before the next visit. 

## 2021-11-24 NOTE — Progress Notes (Signed)
? ?New Patient Office Visit ? ?Subjective:  ?Patient ID: Antonio Carlson, male    DOB: 07-May-1946  Age: 76 y.o. MRN: [id number] ? ?CC:  ?Chief Complaint  ?Patient presents with  ? New Patient (Initial Visit)  ?  New patient has not had pcp just establishing care   ? ? ?HPI ?Antonio Carlson is a 76 y.o. male with past medical history of HTN, CAD s/p CABG, asthma, allergic rhinitis and OA of hip who presents for establishing care. ? ?HTN: BP is well-controlled. Takes medications regularly. Patient denies headache, dizziness, chest pain, dyspnea or palpitations. ? ?CAD s/p CABG: He is on Plavix and statin currently.  He also takes metoprolol for it.  He denies any anginal chest pain or dyspnea currently. ? ?He has history of asthma and allergic rhinitis, for which he uses Perforomist and Pulmicort neb.  He denies any dyspnea or wheezing currently.  He follows up with Dr Melvyn Novas.  He has tried Dupixent, but had an allergic reaction to it. ? ?He has history of OA of hip, for which he sees Dr. Maureen Ralphs. ? ?He complains of numbness of the right hand with wrist pain, which is chronic and intermittent. Denies any recent injury. ? ? ? ? ?Past Medical History:  ?Diagnosis Date  ? Allergy   ? Anginal pain ([place])   ? Arthritis   ? Asthma   ? Benign prostatic hypertrophy   ? Coronary artery disease   ? Diverticulosis   ? Dyspnea   ? GERD (gastroesophageal reflux disease)   ? History of kidney stones   ? Hyperlipidemia   ? Hypertension   ? Nephrolithiasis   ? Pleural effusion on right   ? PVC's (premature ventricular contractions)   ? RECTAL BLEEDING 09/15/2010  ? Qualifier: Diagnosis of  By: Shane Crutch, Amy S   ? Syncope   ? ? ?Past Surgical History:  ?Procedure Laterality Date  ? APPENDECTOMY    ? CORONARY ARTERY BYPASS GRAFT N/A 05/01/2019  ? Procedure: CORONARY ARTERY BYPASS GRAFTING (CABG) TIMES FOUR USING LEFT AND RIGHT INTERNAL MAMMARY ARTERIES AND RIGHT RADIAL ARTERY WITH STERNAL PLATING;  Surgeon: [name], MD;   Location: [facility];  Service: Open Heart Surgery;  Laterality: N/A;  ? LEFT HEART CATH AND CORONARY ANGIOGRAPHY N/A 04/18/2019  ? Procedure: LEFT HEART CATH AND CORONARY ANGIOGRAPHY;  Surgeon: Belva Crome, MD;  Location: [facility];  Service: Cardiovascular;  Laterality: N/A;  ? NASAL SINUS SURGERY  Sept 2012  ? RADIAL ARTERY HARVEST Right 05/01/2019  ? Procedure: Right Radial Artery Harvest;  Surgeon: [name], MD;  Location: [facility];  Service: Open Heart Surgery;  Laterality: Right;  ? TEE WITHOUT CARDIOVERSION N/A 05/01/2019  ? Procedure: TRANSESOPHAGEAL ECHOCARDIOGRAM (TEE);  Surgeon: [name], MD;  Location: [facility];  Service: Open Heart Surgery;  Laterality: N/A;  ? ? ?Family History  ?Problem Relation Age of Onset  ? Heart attack Father   ? ? ?Social History  ? ?Socioeconomic History  ? Marital status: Married  ?  Spouse name: Not on file  ? Number of children: Not on file  ? Years of education: Not on file  ? Highest education level: Not on file  ?Occupational History  ? Not on file  ?Tobacco Use  ? Smoking status: Former  ?  Packs/day: 2.00  ?  Years: 14.00  ?  Pack years: 28.00  ?  Types: Cigarettes  ?  Quit date: 07/27/1969  ?  Years since quitting: 52.3  ? Smokeless tobacco: Never  ?Vaping Use  ? Vaping Use: Never used  ?Substance and Sexual Activity  ? Alcohol use: Yes  ?  Comment: beer occ  ? Drug use: Never  ? Sexual activity: Yes  ?Other Topics Concern  ? Not on file  ?Social History Narrative  ? Not on file  ? ?Social Determinants of Health  ? ?Financial Resource Strain: Not on file  ?Food Insecurity: Not on file  ?Transportation Needs: Not on file  ?Physical Activity: Not on file  ?Stress: Not on file  ?Social Connections: Not on file  ?Intimate Partner Violence: Not on file  ? ? ?ROS ?Review of Systems  ?Constitutional:  Negative for chills and fever.  ?HENT:  Negative for congestion and sore throat.   ?Eyes:  Negative for pain and discharge.  ?Respiratory:  Negative for cough and  shortness of breath.   ?Cardiovascular:  Negative for chest pain and palpitations.  ?Gastrointestinal:  Negative for diarrhea, nausea and vomiting.  ?Endocrine: Negative for polydipsia and polyuria.  ?Genitourinary:  Negative for dysuria and hematuria.  ?Musculoskeletal:  Positive for arthralgias and back pain. Negative for neck pain and neck stiffness.  ?Skin:  Negative for rash.  ?Neurological:  Positive for numbness. Negative for dizziness, weakness and headaches.  ?Psychiatric/Behavioral:  Negative for agitation and behavioral problems.   ? ?Objective:  ? ?Today's Vitals: BP 138/88 (BP Location: Right Arm, Patient Position: Sitting, Cuff Size: Normal)   Pulse 63   Resp 18   Ht 5' 8"  (1.727 m)   Wt 176 lb (79.8 kg)   SpO2 98%   BMI 26.76 kg/m?  ? ?Physical Exam ?Vitals reviewed.  ?Constitutional:   ?   General: He is not in acute distress. ?   Appearance: He is not diaphoretic.  ?HENT:  ?   Head: Normocephalic and atraumatic.  ?   Nose: Nose normal.  ?   Mouth/Throat:  ?   Mouth: Mucous membranes are moist.  ?Eyes:  ?   General: No scleral icterus. ?   Extraocular Movements: Extraocular movements intact.  ?Cardiovascular:  ?   Rate and Rhythm: Normal rate and regular rhythm.  ?   Pulses: Normal pulses.  ?   Heart sounds: Normal heart sounds. No murmur heard. ?Pulmonary:  ?   Breath sounds: Normal breath sounds. No wheezing or rales.  ?Musculoskeletal:  ?   Cervical back: Neck supple. No tenderness.  ?   Right lower leg: No edema.  ?   Left lower leg: No edema.  ?Skin: ?   General: Skin is warm.  ?   Findings: No rash.  ?Neurological:  ?   General: No focal deficit present.  ?   Mental Status: He is alert and oriented to person, place, and time.  ?Psychiatric:     ?   Mood and Affect: Mood normal.     ?   Behavior: Behavior normal.  ? ? ?Assessment & Plan:  ? ?Problem List Items Addressed This Visit   ? ?  ? Cardiovascular and Mediastinum  ? Essential hypertension - Primary (Chronic)  ?  BP Readings from  Last 1 Encounters:  ?11/24/21 138/88  ?Well-controlled with Telmisartan 80 mg QD and Metoprolol 50 mg QD ?Counseled for compliance with the medications ?Advised DASH diet and moderate exercise/walking as tolerated ? ?  ?  ? Relevant Orders  ? TSH  ? CMP14+EGFR  ? CBC with Differential/Platelet  ? Coronary artery disease (Chronic)  ?  Status post CABG ?On Plavix and metoprolol ?Denies any anginal pain or dyspnea currently ?Followed by cardiology ? ?  ?  ? Relevant Orders  ? TSH  ? CBC with Differential/Platelet  ? Lipid Profile  ?  ? Respiratory  ? Triad asthma (Chronic)  ?  Well controlled with Pulmicort and Perforomist neb ?Did not tolerate Dupixent ?On Singulair ?Followed by Dr Melvyn Novas ? ?  ?  ? Relevant Medications  ? Fluticasone Propionate (XHANCE) 23 MCG/ACT EXHU  ? Other Relevant Orders  ? CMP14+EGFR  ? CBC with Differential/Platelet  ?  ? Nervous and Auditory  ? Carpal tunnel syndrome of right wrist  ?  Chronic, intermittent numbness of the right hand with wrist pain ?Tylenol as needed ?Advised to wear wrist brace at nighttime for now ? ?  ?  ?  ? Musculoskeletal and Integument  ? Hip arthritis  ?  Tylenol as needed ?Followed by Orthopedic surgery ? ?  ?  ? Intertrigo  ?  Has itching and redness in the inguinal fold ?Likely intertrigo, ketoconazole cream prescribed ? ?  ?  ? Relevant Medications  ? ketoconazole (NIZORAL) 2 % cream  ?  ? Other  ? Hyperlipidemia  ?  On statin ? ?  ?  ? Relevant Orders  ? Lipid Profile  ? S/P CABG x 4  ? Relevant Orders  ? TSH  ? Prostate cancer screening  ?  Ordered PSA after discussing its limitations for prostate cancer screening, including false positive results leading additional investigations. ? ? ?  ?  ? Relevant Orders  ? PSA  ? ?Other Visit Diagnoses   ? ? Benign prostatic hyperplasia with nocturia      ? Relevant Orders  ? PSA  ? Prediabetes      ? Relevant Orders  ? Hemoglobin A1c  ? Vitamin D deficiency      ? Relevant Orders  ? VITAMIN D 25 Hydroxy (Vit-D Deficiency,  Fractures)  ? ?  ? ? ?Outpatient Encounter Medications as of 11/24/2021  ?Medication Sig  ? acetaminophen (TYLENOL) 500 MG tablet Take 500-1,000 mg by mouth every 6 (six) hours as needed for moderate pain or headache.

## 2021-11-27 DIAGNOSIS — L304 Erythema intertrigo: Secondary | ICD-10-CM | POA: Insufficient documentation

## 2021-11-27 DIAGNOSIS — Z125 Encounter for screening for malignant neoplasm of prostate: Secondary | ICD-10-CM | POA: Insufficient documentation

## 2021-11-27 DIAGNOSIS — M25552 Pain in left hip: Secondary | ICD-10-CM | POA: Diagnosis not present

## 2021-11-27 NOTE — Assessment & Plan Note (Signed)
On statin.

## 2021-11-27 NOTE — Assessment & Plan Note (Signed)
Status post CABG On Plavix and metoprolol Denies any anginal pain or dyspnea currently Followed by cardiology 

## 2021-11-27 NOTE — Assessment & Plan Note (Signed)
Has itching and redness in the inguinal fold ?Likely intertrigo, ketoconazole cream prescribed ?

## 2021-11-27 NOTE — Assessment & Plan Note (Signed)
Tylenol as needed ?Followed by Orthopedic surgery ?

## 2021-11-27 NOTE — Assessment & Plan Note (Signed)
Well controlled with Pulmicort and Perforomist neb ?Did not tolerate Dupixent ?On Singulair ?Followed by Dr Melvyn Novas ?

## 2021-11-27 NOTE — Assessment & Plan Note (Signed)
Chronic, intermittent numbness of the right hand with wrist pain ?Tylenol as needed ?Advised to wear wrist brace at nighttime for now ?

## 2021-11-27 NOTE — Assessment & Plan Note (Signed)
BP Readings from Last 1 Encounters:  ?11/24/21 138/88  ? ?Well-controlled with Telmisartan 80 mg QD and Metoprolol 50 mg QD ?Counseled for compliance with the medications ?Advised DASH diet and moderate exercise/walking as tolerated ? ?

## 2021-11-27 NOTE — Assessment & Plan Note (Addendum)
Ordered PSA after discussing its limitations for prostate cancer screening, including false positive results leading additional investigations. 

## 2021-12-18 DIAGNOSIS — M545 Low back pain, unspecified: Secondary | ICD-10-CM | POA: Diagnosis not present

## 2022-01-07 ENCOUNTER — Ambulatory Visit (INDEPENDENT_AMBULATORY_CARE_PROVIDER_SITE_OTHER): Payer: Medicare Other | Admitting: *Deleted

## 2022-01-07 DIAGNOSIS — Z0000 Encounter for general adult medical examination without abnormal findings: Secondary | ICD-10-CM

## 2022-01-07 NOTE — Patient Instructions (Signed)
Antonio Carlson , Thank you for taking time to come for your Medicare Wellness Visit. I appreciate your ongoing commitment to your health goals. Please review the following plan we discussed and let me know if I can assist you in the future.   Screening recommendations/referrals: Recommended yearly ophthalmology/optometry visit for glaucoma screening and checkup Recommended yearly dental visit for hygiene and checkup  Vaccinations: Influenza vaccine: completed Pneumococcal vaccine: completed Tdap vaccine: completed Shingles vaccine: completed    Conditions/risks identified: hypertension  Next appointment: 1 year   Preventive Care 78 Years and Older, Male Preventive care refers to lifestyle choices and visits with your health care provider that can promote health and wellness. What does preventive care include? A yearly physical exam. This is also called an annual well check. Dental exams once or twice a year. Routine eye exams. Ask your health care provider how often you should have your eyes checked. Personal lifestyle choices, including: Daily care of your teeth and gums. Regular physical activity. Eating a healthy diet. Avoiding tobacco and drug use. Limiting alcohol use. Practicing safe sex. Taking low doses of aspirin every day. Taking vitamin and mineral supplements as recommended by your health care provider. What happens during an annual well check? The services and screenings done by your health care provider during your annual well check will depend on your age, overall health, lifestyle risk factors, and family history of disease. Counseling  Your health care provider may ask you questions about your: Alcohol use. Tobacco use. Drug use. Emotional well-being. Home and relationship well-being. Sexual activity. Eating habits. History of falls. Memory and ability to understand (cognition). Work and work Statistician. Screening  You may have the following tests or  measurements: Height, weight, and BMI. Blood pressure. Lipid and cholesterol levels. These may be checked every 5 years, or more frequently if you are over 78 years old. Skin check. Lung cancer screening. You may have this screening every year starting at age 78 if you have a 30-pack-year history of smoking and currently smoke or have quit within the past 15 years. Fecal occult blood test (FOBT) of the stool. You may have this test every year starting at age 78. Flexible sigmoidoscopy or colonoscopy. You may have a sigmoidoscopy every 5 years or a colonoscopy every 10 years starting at age 78. Prostate cancer screening. Recommendations will vary depending on your family history and other risks. Hepatitis C blood test. Hepatitis B blood test. Sexually transmitted disease (STD) testing. Diabetes screening. This is done by checking your blood sugar (glucose) after you have not eaten for a while (fasting). You may have this done every 1-3 years. Abdominal aortic aneurysm (AAA) screening. You may need this if you are a current or former smoker. Osteoporosis. You may be screened starting at age 78 if you are at high risk. Talk with your health care provider about your test results, treatment options, and if necessary, the need for more tests. Vaccines  Your health care provider may recommend certain vaccines, such as: Influenza vaccine. This is recommended every year. Tetanus, diphtheria, and acellular pertussis (Tdap, Td) vaccine. You may need a Td booster every 10 years. Zoster vaccine. You may need this after age 40. Pneumococcal 13-valent conjugate (PCV13) vaccine. One dose is recommended after age 78. Pneumococcal polysaccharide (PPSV23) vaccine. One dose is recommended after age 78. Talk to your health care provider about which screenings and vaccines you need and how often you need them. This information is not intended to replace advice given  to you by your health care provider. Make sure  you discuss any questions you have with your health care provider. Document Released: 08/09/2015 Document Revised: 04/01/2016 Document Reviewed: 05/14/2015 Elsevier Interactive Patient Education  2017 [place] Prevention in the Home Falls can cause injuries. They can happen to people of all ages. There are many things you can do to make your home safe and to help prevent falls. What can I do on the outside of my home? Regularly fix the edges of walkways and driveways and fix any cracks. Remove anything that might make you trip as you walk through a door, such as a raised step or threshold. Trim any bushes or trees on the path to your home. Use bright outdoor lighting. Clear any walking paths of anything that might make someone trip, such as rocks or tools. Regularly check to see if handrails are loose or broken. Make sure that both sides of any steps have handrails. Any raised decks and porches should have guardrails on the edges. Have any leaves, snow, or ice cleared regularly. Use sand or salt on walking paths during winter. Clean up any spills in your garage right away. This includes oil or grease spills. What can I do in the bathroom? Use night lights. Install grab bars by the toilet and in the tub and shower. Do not use towel bars as grab bars. Use non-skid mats or decals in the tub or shower. If you need to sit down in the shower, use a plastic, non-slip stool. Keep the floor dry. Clean up any water that spills on the floor as soon as it happens. Remove soap buildup in the tub or shower regularly. Attach bath mats securely with double-sided non-slip rug tape. Do not have throw rugs and other things on the floor that can make you trip. What can I do in the bedroom? Use night lights. Make sure that you have a light by your bed that is easy to reach. Do not use any sheets or blankets that are too big for your bed. They should not hang down onto the floor. Have a firm  chair that has side arms. You can use this for support while you get dressed. Do not have throw rugs and other things on the floor that can make you trip. What can I do in the kitchen? Clean up any spills right away. Avoid walking on wet floors. Keep items that you use a lot in easy-to-reach places. If you need to reach something above you, use a strong step stool that has a grab bar. Keep electrical cords out of the way. Do not use floor polish or wax that makes floors slippery. If you must use wax, use non-skid floor wax. Do not have throw rugs and other things on the floor that can make you trip. What can I do with my stairs? Do not leave any items on the stairs. Make sure that there are handrails on both sides of the stairs and use them. Fix handrails that are broken or loose. Make sure that handrails are as long as the stairways. Check any carpeting to make sure that it is firmly attached to the stairs. Fix any carpet that is loose or worn. Avoid having throw rugs at the top or bottom of the stairs. If you do have throw rugs, attach them to the floor with carpet tape. Make sure that you have a light switch at the top of the stairs and the bottom of the  stairs. If you do not have them, ask someone to add them for you. What else can I do to help prevent falls? Wear shoes that: Do not have high heels. Have rubber bottoms. Are comfortable and fit you well. Are closed at the toe. Do not wear sandals. If you use a stepladder: Make sure that it is fully opened. Do not climb a closed stepladder. Make sure that both sides of the stepladder are locked into place. Ask someone to hold it for you, if possible. Clearly mark and make sure that you can see: Any grab bars or handrails. First and last steps. Where the edge of each step is. Use tools that help you move around (mobility aids) if they are needed. These include: Canes. Walkers. Scooters. Crutches. Turn on the lights when you go  into a dark area. Replace any light bulbs as soon as they burn out. Set up your furniture so you have a clear path. Avoid moving your furniture around. If any of your floors are uneven, fix them. If there are any pets around you, be aware of where they are. Review your medicines with your doctor. Some medicines can make you feel dizzy. This can increase your chance of falling. Ask your doctor what other things that you can do to help prevent falls. This information is not intended to replace advice given to you by your health care provider. Make sure you discuss any questions you have with your health care provider. Document Released: 05/09/2009 Document Revised: 12/19/2015 Document Reviewed: 08/17/2014 Elsevier Interactive Patient Education  2017 [employer].

## 2022-01-07 NOTE — Progress Notes (Signed)
Subjective:   Antonio Carlson is a 76 y.o. male who presents for Medicare Annual/Subsequent preventive examination.  I connected with  Antonio Carlson on 01/07/22 by a audio enabled telemedicine application and verified that I am speaking with the correct person using two identifiers.  Patient Location: Home  Provider Location: Office/Clinic  I discussed the limitations of evaluation and management by telemedicine. The patient expressed understanding and agreed to proceed.  Review of Systems     Antonio Carlson , Thank you for taking time to come for your Medicare Wellness Visit. I appreciate your ongoing commitment to your health goals. Please review the following plan we discussed and let me know if I can assist you in the future.   These are the goals we discussed:  Goals   None     This is a list of the screening recommended for you and due dates:  Health Maintenance  Topic Date Due   Hepatitis C Screening: USPSTF Recommendation to screen - Ages 36-79 yo.  Never done   COVID-19 Vaccine (3 - Pfizer risk series) 12/12/2019   Zoster (Shingles) Vaccine (2 of 2) 01/19/2022   Flu Shot  02/24/2022   Tetanus Vaccine  07/31/2025   Pneumonia Vaccine  Completed   HPV Vaccine  Aged Out   Colon Cancer Screening  Discontinued          Objective:    There were no vitals filed for this visit. There is no height or weight on file to calculate BMI.     08/02/2021    7:21 AM 05/03/2019    5:00 PM 04/27/2019    9:59 AM 04/18/2019    5:45 AM 06/03/2014    8:03 AM  Advanced Directives  Does Patient Have a Medical Advance Directive? No No No No No  Would patient like information on creating a medical advance directive? No - Patient declined No - Patient declined No - Patient declined No - Patient declined No - patient declined information    Current Medications (verified) Outpatient Encounter Medications as of 01/07/2022  Medication Sig   acetaminophen (TYLENOL) 500 MG tablet Take  500-1,000 mg by mouth every 6 (six) hours as needed for moderate pain or headache.   albuterol (VENTOLIN HFA) 108 (90 Base) MCG/ACT inhaler Inhale 2 puffs into the lungs every 6 (six) hours as needed for wheezing or shortness of breath.   atorvastatin (LIPITOR) 80 MG tablet Take 1 tablet (80 mg total) by mouth daily. TAKE 1 TABLET BY MOUTH EVERY DAY AT 6PM (Patient taking differently: Take 80 mg by mouth every evening.)   budesonide (PULMICORT) 0.25 MG/2ML nebulizer solution USE 1 VIAL IN NEBULIZER TWICE DAILY (Patient taking differently: Take 0.25 mg by nebulization in the morning and at bedtime.)   CALCIUM CITRATE-VITAMIN D PO Chew 1 tablet by mouth daily.   clopidogrel (PLAVIX) 75 MG tablet TAKE 1 TABLET BY MOUTH EVERY DAY (Patient taking differently: Take 75 mg by mouth daily.)   Fluticasone Propionate (XHANCE) 93 MCG/ACT EXHU Place into the nose.   formoterol (PERFOROMIST) 20 MCG/2ML nebulizer solution Take 2 mLs (20 mcg total) by nebulization 2 (two) times daily.   ketoconazole (NIZORAL) 2 % cream Apply 1 application. topically daily.   metoprolol tartrate (LOPRESSOR) 50 MG tablet TAKE 1/2 TABLET BY MOUTH TWICE A DAY   montelukast (SINGULAIR) 10 MG tablet Take 1 tablet (10 mg total) by mouth at bedtime.   Multiple Vitamins-Minerals (CENTRUM) tablet Take 1 tablet by mouth daily.  pantoprazole (PROTONIX) 40 MG tablet Take 1 tablet (40 mg total) by mouth daily.   telmisartan (MICARDIS) 80 MG tablet Take 1 tablet (80 mg total) by mouth daily.   [DISCONTINUED] rosuvastatin (CRESTOR) 20 MG tablet Take 1 tablet (20 mg total) by mouth at bedtime.   No facility-administered encounter medications on file as of 01/07/2022.    Allergies (verified) Aspirin, Amoxicillin, and Sulfonamide derivatives   History: Past Medical History:  Diagnosis Date   Allergy    Anginal pain (HCC)    Arthritis    Asthma    Benign prostatic hypertrophy    Coronary artery disease    Diverticulosis    Dyspnea     GERD (gastroesophageal reflux disease)    History of kidney stones    Hyperlipidemia    Hypertension    Nephrolithiasis    Pleural effusion on right    PVC's (premature ventricular contractions)    RECTAL BLEEDING 09/15/2010   Qualifier: Diagnosis of  By: Shane Crutch, Amy S    Syncope    Past Surgical History:  Procedure Laterality Date   APPENDECTOMY     CORONARY ARTERY BYPASS GRAFT N/A 05/01/2019   Procedure: CORONARY ARTERY BYPASS GRAFTING (CABG) TIMES FOUR USING LEFT AND RIGHT INTERNAL MAMMARY ARTERIES AND RIGHT RADIAL ARTERY WITH STERNAL PLATING;  Surgeon: [name], MD;  Location: [facility];  Service: Open Heart Surgery;  Laterality: N/A;   LEFT HEART CATH AND CORONARY ANGIOGRAPHY N/A 04/18/2019   Procedure: LEFT HEART CATH AND CORONARY ANGIOGRAPHY;  Surgeon: Belva Crome, MD;  Location: [facility];  Service: Cardiovascular;  Laterality: N/A;   NASAL SINUS SURGERY  Sept 2012   RADIAL ARTERY HARVEST Right 05/01/2019   Procedure: Right Radial Artery Harvest;  Surgeon: [name], MD;  Location: [facility];  Service: Open Heart Surgery;  Laterality: Right;   TEE WITHOUT CARDIOVERSION N/A 05/01/2019   Procedure: TRANSESOPHAGEAL ECHOCARDIOGRAM (TEE);  Surgeon: [name], MD;  Location: [facility];  Service: Open Heart Surgery;  Laterality: N/A;   Family History  Problem Relation Age of Onset   Heart attack Father    Social History   Socioeconomic History   Marital status: Married    Spouse name: Not on file   Number of children: Not on file   Years of education: Not on file   Highest education level: Not on file  Occupational History   Not on file  Tobacco Use   Smoking status: Former    Packs/day: 2.00    Years: 14.00    Total pack years: 28.00    Types: Cigarettes    Quit date: 07/27/1969    Years since quitting: 52.4   Smokeless tobacco: Never  Vaping Use   Vaping Use: Never used  Substance and Sexual Activity   Alcohol use: Yes    Comment: beer  occ   Drug use: Never   Sexual activity: Yes  Other Topics Concern   Not on file  Social History Narrative   Not on file   Social Determinants of Health   Financial Resource Strain: Not on file  Food Insecurity: Not on file  Transportation Needs: Not on file  Physical Activity: Not on file  Stress: Not on file  Social Connections: Not on file    Tobacco Counseling Counseling given: Not Answered   Clinical Intake:                 Diabetic? no  Activities of Daily Living    08/02/2021    8:00 PM  In your present state of health, do you have any difficulty performing the following activities:  Hearing? 0  Vision? 0  Difficulty concentrating or making decisions? 0  Walking or climbing stairs? 1  Dressing or bathing? 0  Doing errands, shopping? 0    Patient Care Team: Lindell Spar, MD as PCP - General (Internal Medicine) Debara Pickett Nadean Corwin, MD as PCP - Cardiology (Cardiology)  Indicate any recent Medical Services you may have received from other than [facility] providers in the past year (date may be approximate).     Assessment:   This is a routine wellness examination for [place].  Hearing/Vision screen No results found.  Dietary issues and exercise activities discussed:     Goals Addressed   None   Depression Screen    11/24/2021    8:12 AM  PHQ 2/9 Scores  PHQ - 2 Score 0    Fall Risk    11/24/2021    8:12 AM  [place] in the past year? 0  Number falls in past yr: 0  Injury with Fall? 0  Risk for fall due to : No Fall Risks  Follow up Falls evaluation completed    [place]:  Any stairs in or around the home? No  If so, are there any without handrails? No  Home free of loose throw rugs in walkways, pet beds, electrical cords, etc? Yes  Adequate lighting in your home to reduce risk of falls? Yes   ASSISTIVE DEVICES UTILIZED TO PREVENT FALLS:  Life alert? No  Use of a cane, walker  or w/c? No  Grab bars in the bathroom? Yes  Shower chair or bench in shower? No  Elevated toilet seat or a handicapped toilet? Yes   Cognitive Function:        Immunizations Immunization History  Administered Date(s) Administered   Fluad Quad(high Dose 65+) 05/05/2019   Influenza Split 07/17/2011, 05/10/2012, 04/26/2014   Influenza Whole 05/17/2008, 06/11/2009   Influenza, High Dose Seasonal PF 05/08/2017, 05/05/2018, 05/21/2021   Influenza,inj,Quad PF,6+ Mos 08/14/2013, 04/29/2015   Influenza-Unspecified 04/27/2016   PFIZER(Purple Top)SARS-COV-2 Vaccination 10/21/2019, 11/14/2019   Pneumococcal Conjugate-13 08/14/2013   Pneumococcal Polysaccharide-23 06/11/2009   Tdap 08/01/2015    TDAP status: Up to date  Flu Vaccine status: Up to date  Pneumococcal vaccine status: Up to date  Covid-19 vaccine status: Completed vaccines  Qualifies for Shingles Vaccine? Yes   Zostavax completed Yes   Shingrix Completed?: Yes  Screening Tests Health Maintenance  Topic Date Due   Hepatitis C Screening  Never done   COVID-19 Vaccine (3 - Pfizer risk series) 12/12/2019   Zoster Vaccines- Shingrix (2 of 2) 01/19/2022   INFLUENZA VACCINE  02/24/2022   TETANUS/TDAP  07/31/2025   Pneumonia Vaccine 43+ Years old  Completed   HPV VACCINES  Aged Out   COLONOSCOPY (Pts 45-12yr Insurance coverage will need to be confirmed)  Discontinued    Health Maintenance  Health Maintenance Due  Topic Date Due   Hepatitis C Screening  Never done   COVID-19 Vaccine (3 - Pfizer risk series) 12/12/2019    Colorectal Screening: No longer required  Lung Cancer Screening: (Low Dose CT Chest recommended if Age 78-80years, 30 pack-year currently smoking OR have quit w/in 15years.) does not qualify.   Lung Cancer Screening Referral: NA  Additional Screening:  Hepatitis C Screening: does  qualify; Ordered  Vision Screening: Recommended annual ophthalmology exams for early detection of glaucoma and  other disorders of the eye. Is the patient up to date with their annual eye exam?  Yes  Who is the provider or what is the name of the office in which the patient attends annual eye exams? [facility] opthalmology If pt is not established with a provider, would they like to be referred to a provider to establish care? No .   Dental Screening: Recommended annual dental exams for proper oral hygiene  Community Resource Referral / Chronic Care Management: CRR required this visit?  No   CCM required this visit?  No      Plan:     I have personally reviewed and noted the following in the patient's chart:   Medical and social history Use of alcohol, tobacco or illicit drugs  Current medications and supplements including opioid prescriptions. Patient is not currently taking opioid prescriptions. Functional ability and status Nutritional status Physical activity Advanced directives List of other physicians Hospitalizations, surgeries, and ER visits in previous 12 months Vitals Screenings to include cognitive, depression, and falls Referrals and appointments  In addition, I have reviewed and discussed with patient certain preventive protocols, quality metrics, and best practice recommendations. A written personalized care plan for preventive services as well as general preventive health recommendations were provided to patient.     Shelda Altes, CMA   01/07/2022   Nurse Notes:  Mr. Ottey , Thank you for taking time to come for your Medicare Wellness Visit. I appreciate your ongoing commitment to your health goals. Please review the following plan we discussed and let me know if I can assist you in the future.   These are the goals we discussed:  Goals   None     This is a list of the screening recommended for you and due dates:  Health Maintenance  Topic Date Due   Hepatitis C Screening: USPSTF Recommendation to screen - Ages 103-79 yo.  Never done   COVID-19 Vaccine (3 -  Pfizer risk series) 12/12/2019   Zoster (Shingles) Vaccine (2 of 2) 01/19/2022   Flu Shot  02/24/2022   Tetanus Vaccine  07/31/2025   Pneumonia Vaccine  Completed   HPV Vaccine  Aged Out   Colon Cancer Screening  Discontinued

## 2022-01-21 DIAGNOSIS — M1612 Unilateral primary osteoarthritis, left hip: Secondary | ICD-10-CM | POA: Diagnosis not present

## 2022-01-21 DIAGNOSIS — M25552 Pain in left hip: Secondary | ICD-10-CM | POA: Diagnosis not present

## 2022-01-23 ENCOUNTER — Telehealth: Payer: Self-pay | Admitting: Internal Medicine

## 2022-01-23 MED ORDER — BUDESONIDE 0.25 MG/2ML IN SUSP
RESPIRATORY_TRACT | 11 refills | Status: DC
Start: 2022-01-23 — End: 2023-02-25

## 2022-01-23 NOTE — Telephone Encounter (Signed)
ATC patient, LMTCB   pt states the pharmacy informed him that his medication budesonide isnt covered by medicare anymore and also it is on back order at the pharmacy. pt is asking is there an alternative or whats the next option   pharmacy: [facility] on [address]

## 2022-01-23 NOTE — Telephone Encounter (Signed)
I have sent rx, ins card and ov note over to [facility]  Called the pt and there was no answer- LMTCB

## 2022-01-23 NOTE — Telephone Encounter (Signed)
Try running it thru [facility] at [facility] or thru whoever is supplying the performist as this is the first I've heard of this

## 2022-01-26 NOTE — Telephone Encounter (Signed)
Asmanex 200 2 puffs each am is the best we can do for now - hopefully this will be temporary but make sure he sees me before refilling any to see what other options there are that are both available and affordable on his plan   Also option to see Allergy to restart a different biologic if all else fails  (he should discuss this with Dr Maudie Mercury directly)

## 2022-01-26 NOTE — Telephone Encounter (Signed)
Called patient but he did not answer. Left detailed message for him to call us back to go over the budesonide alteratives before sending in the Asmanex prescription.

## 2022-01-26 NOTE — Telephone Encounter (Signed)
Patient returned call. I made him aware that the RX for budesonide had been sent to [facility] on Friday. Advised him that I would call to see if they would be able to process his prescription. He verbalized understanding. He did state that [facility] had the Perforomist prescription ready.   Called [facility] and spoke with Benjamine Mola. She confirmed that they received the prescription from 06/30 and they have tried to contact the patient twice with no answer. Their budesonide is currently out of stock and they are not sure of when they will get a new supply from the manufacturer.   I also called the local [facility] and spoke with the pharmacist. The pharmacist relayed the same information.   Dr. Melvyn Novas, do you have any suggestions on a replacement for the budesonide?

## 2022-01-26 NOTE — Telephone Encounter (Signed)
Called and spoke with patient about the Asmanex prescription. He stated that he does not do well with inhalers. He asked if he needed to still take the Perforomist treatments and I advised him that he does. He is aware that I will send a message to our pharmacy staff to see if the budesonide gets a rejection. He received a message from [facility] stating Medicare was rejecting the budesonide due to a change in the manufacturer.   Pharmacy team, are you all able to run a test claim on his budesonide and see you all get a rejection? Thank you!

## 2022-01-28 ENCOUNTER — Other Ambulatory Visit (HOSPITAL_COMMUNITY): Payer: Self-pay

## 2022-01-29 NOTE — Telephone Encounter (Signed)
Left message for patient to call back  

## 2022-02-11 DIAGNOSIS — M25552 Pain in left hip: Secondary | ICD-10-CM | POA: Diagnosis not present

## 2022-02-12 NOTE — Telephone Encounter (Signed)
Called patient again but he did not answer. Left message for him to call back. Will close encounter since this was the 2nd call back.

## 2022-02-25 ENCOUNTER — Telehealth: Payer: Self-pay | Admitting: Internal Medicine

## 2022-02-25 NOTE — Telephone Encounter (Signed)
ATC patient for clarification. Line was busy.

## 2022-02-26 NOTE — Telephone Encounter (Signed)
Tried to cal patient to get clarification on pharmacy and medication request. But line was busy. So Unable to talk to patient. Will send letter to patient and close encounter.

## 2022-02-27 ENCOUNTER — Other Ambulatory Visit (HOSPITAL_COMMUNITY): Payer: Self-pay

## 2022-02-27 ENCOUNTER — Telehealth: Payer: Self-pay | Admitting: Internal Medicine

## 2022-02-27 MED ORDER — FORMOTEROL FUMARATE 20 MCG/2ML IN NEBU: 20.0000 ug | INHALATION_SOLUTION | Freq: Two times a day (BID) | RESPIRATORY_TRACT | 6 refills | Status: DC

## 2022-02-27 MED ORDER — BUDESONIDE-FORMOTEROL FUMARATE 160-4.5 MCG/ACT IN AERO: 2.0000 | INHALATION_SPRAY | Freq: Two times a day (BID) | RESPIRATORY_TRACT | 12 refills | Status: DC

## 2022-02-27 NOTE — Telephone Encounter (Signed)
Good Morning ladies!   Patient called and states that his insurance is requesting prior authorizations for the Formoterol and Budesonide.  Please advise   Thank you

## 2022-02-27 NOTE — Telephone Encounter (Signed)
Same med as symbicort 160 so rec stop perforomist and budesonide and substitute symbicort 180 Take 2 puffs first thing in am and then another 2 puffs about 12 hours later.

## 2022-02-27 NOTE — Telephone Encounter (Signed)
Ordered Symbicort for patient and noted that he is to take 2 puffs twice a day. I resent rx for Formoterol per patients request. Nothing further needed

## 2022-02-27 NOTE — Telephone Encounter (Signed)
Called patient and we over that medication was covered under his part B. Patient states that he has been out for months of the Budesonide.   Sir do you suggest an alternative for the Budesonide nebs while it is on back order.   Please advise sir

## 2022-03-03 ENCOUNTER — Other Ambulatory Visit (HOSPITAL_COMMUNITY): Payer: Self-pay

## 2022-03-03 NOTE — Telephone Encounter (Signed)
Patient is calling stating that Medicare is telling him that his Formoterol is needing a prior auth.  Please advise ladies.   Thank you

## 2022-03-04 ENCOUNTER — Other Ambulatory Visit: Payer: Self-pay | Admitting: Internal Medicine

## 2022-03-11 ENCOUNTER — Telehealth: Payer: Self-pay

## 2022-03-11 NOTE — Telephone Encounter (Signed)
   Pre-operative Risk Assessment    Patient Name: Antonio Carlson  DOB: Dec 28, 1945 MRN: [id number]      Request for Surgical Clearance    Procedure:   Left Total Hip Arthroplasty  Date of Surgery:  Clearance 06/24/22                                 Surgeon:  Dr. Gaynelle Arabian Surgeon's Group or Practice Name:  [facility] Phone number:  [phone] Fax number:  [phone]   Type of Clearance Requested:   - Medical    Type of Anesthesia:   Choice   Additional requests/questions:  Please advise surgeon/provider what medications should be held.  Signed, Elsie Lincoln Chavonne Sforza   03/11/2022, 3:40 PM

## 2022-03-12 NOTE — Telephone Encounter (Signed)
   Name: JUSTN QUALE  DOB: 09-28-1945  MRN: [id number]  Primary Cardiologist: Pixie Casino, MD  Chart reviewed as part of pre-operative protocol coverage. Because of ELDO UMANZOR past medical history and time since last visit, he will require a follow-up in-office visit in order to better assess preoperative cardiovascular risk.  Pre-op covering staff: - Please schedule appointment and call patient to inform them. If patient already had an upcoming appointment within acceptable timeframe, please add "pre-op clearance" to the appointment notes so provider is aware. - Please contact requesting surgeon's office via preferred method (i.e, phone, fax) to inform them of need for appointment prior to surgery.  This message will also be routed to  Dr. Debara Pickett for input on holding Plavix as requested below so that this information is available to the clearing provider at time of patient's appointment.   Lenna Sciara, NP  03/12/2022, 11:19 AM

## 2022-03-12 NOTE — Telephone Encounter (Signed)
Pt has been scheduled to see Coletta Memos, NP, 03/24/22 8:50, clearance will be addressed at that time.  Will route back to the requesting surgeon's office to make them aware.

## 2022-03-12 NOTE — Telephone Encounter (Signed)
Routed to pre-op callback team - per MD, patient needs appointment

## 2022-03-13 ENCOUNTER — Telehealth: Payer: Self-pay | Admitting: Internal Medicine

## 2022-03-13 NOTE — Telephone Encounter (Signed)
Fax received from Dr. Gaynelle Arabian with [facility] to perform a LEFT TOTAL HIP ARTHROPLASTY on patient.  Patient needs surgery clearance. Surgery is 06/24/2022. Patient was seen on 09/23/2021. Office protocol is a risk assessment can be sent to surgeon if patient has been seen in 60 days or less.   Sending to Dr Melvyn Novas for risk assessment or recommendations if patient needs to be seen in office prior to surgical procedure.    Patient has office visit with Dr Melvyn Novas on 03/31/2022 to get clear for surgery.

## 2022-03-13 NOTE — Telephone Encounter (Signed)
Ok to clear for surgery

## 2022-03-18 DIAGNOSIS — M1612 Unilateral primary osteoarthritis, left hip: Secondary | ICD-10-CM | POA: Diagnosis not present

## 2022-03-19 ENCOUNTER — Ambulatory Visit (INDEPENDENT_AMBULATORY_CARE_PROVIDER_SITE_OTHER): Payer: Medicare Other | Admitting: Internal Medicine

## 2022-03-19 ENCOUNTER — Other Ambulatory Visit: Payer: Self-pay | Admitting: Internal Medicine

## 2022-03-19 ENCOUNTER — Encounter: Payer: Self-pay | Admitting: Internal Medicine

## 2022-03-19 DIAGNOSIS — U071 COVID-19: Secondary | ICD-10-CM | POA: Diagnosis not present

## 2022-03-19 MED ORDER — NIRMATRELVIR/RITONAVIR (PAXLOVID)TABLET: 3.0000 | ORAL_TABLET | Freq: Two times a day (BID) | ORAL | 0 refills | Status: DC

## 2022-03-19 NOTE — Progress Notes (Signed)
Virtual Visit via Telephone Note   This visit type was conducted due to national recommendations for restrictions regarding the COVID-19 Pandemic (e.g. social distancing) in an effort to limit this patient's exposure and mitigate transmission in our community.  Due to his co-morbid illnesses, this patient is at least at moderate risk for complications without adequate follow up.  This format is felt to be most appropriate for this patient at this time.  The patient did not have access to video technology/had technical difficulties with video requiring transitioning to audio format only (telephone).  All issues noted in this document were discussed and addressed.  No physical exam could be performed with this format.  Evaluation Performed:  Follow-up visit  Date:  03/19/2022   ID:  Antonio, Carlson [birth year], MRN [id number]  Patient Location: Home Provider Location: Office/Clinic  Participants: Patient Location of Patient: Home Location of Provider: Telehealth Consent was obtain for visit to be over via telehealth. I verified that I am speaking with the correct person using two identifiers.  PCP:  Antonio Spar, MD   Chief Complaint: Cough, fever and myalgias  History of Present Illness:    Antonio Carlson is a 76 y.o. male who has a televisit for complaint of cough, fever, chills, nasal congestion and myalgias since yesterday.  He tested positive for COVID yesterday.  He denies any dyspnea or wheezing currently.  He has tried taking tramadol as needed for myalgias with some relief.  The patient does have symptoms concerning for COVID-19 infection (fever, chills, cough, or new shortness of breath).   Past Medical, Surgical, Social History, Allergies, and Medications have been Reviewed.  Past Medical History:  Diagnosis Date   Allergy    Anginal pain ([place])    Arthritis    Asthma    Benign prostatic hypertrophy    Coronary artery disease    Diverticulosis    Dyspnea     GERD (gastroesophageal reflux disease)    History of kidney stones    Hyperlipidemia    Hypertension    Nephrolithiasis    Pleural effusion on right    PVC's (premature ventricular contractions)    RECTAL BLEEDING 09/15/2010   Qualifier: Diagnosis of  By: Shane Crutch, Amy S    Syncope    Past Surgical History:  Procedure Laterality Date   APPENDECTOMY     CORONARY ARTERY BYPASS GRAFT N/A 05/01/2019   Procedure: CORONARY ARTERY BYPASS GRAFTING (CABG) TIMES FOUR USING LEFT AND RIGHT INTERNAL MAMMARY ARTERIES AND RIGHT RADIAL ARTERY WITH STERNAL PLATING;  Surgeon: [name], MD;  Location: [place];  Service: Open Heart Surgery;  Laterality: N/A;   LEFT HEART CATH AND CORONARY ANGIOGRAPHY N/A 04/18/2019   Procedure: LEFT HEART CATH AND CORONARY ANGIOGRAPHY;  Surgeon: Belva Crome, MD;  Location: [place] [facility];  Service: Cardiovascular;  Laterality: N/A;   NASAL SINUS SURGERY  Sept 2012   RADIAL ARTERY HARVEST Right 05/01/2019   Procedure: Right Radial Artery Harvest;  Surgeon: [name], MD;  Location: [place];  Service: Open Heart Surgery;  Laterality: Right;   TEE WITHOUT CARDIOVERSION N/A 05/01/2019   Procedure: TRANSESOPHAGEAL ECHOCARDIOGRAM (TEE);  Surgeon: [name], MD;  Location: [place];  Service: Open Heart Surgery;  Laterality: N/A;     Current Meds  Medication Sig   acetaminophen (TYLENOL) 500 MG tablet Take 500-1,000 mg by mouth every 6 (six) hours as needed for moderate pain or headache.  albuterol (VENTOLIN HFA) 108 (90 Base) MCG/ACT inhaler Inhale 2 puffs into the lungs every 6 (six) hours as needed for wheezing or shortness of breath.   atorvastatin (LIPITOR) 80 MG tablet TAKE 1 TABLET (80 MG TOTAL) BY MOUTH DAILY AT 6PM   budesonide (PULMICORT) 0.25 MG/2ML nebulizer solution USE 1 VIAL IN NEBULIZER TWICE DAILY   budesonide-formoterol (SYMBICORT) 160-4.5 MCG/ACT inhaler Inhale 2 puffs into the lungs 2 (two) times daily.   CALCIUM CITRATE-VITAMIN D  PO Chew 1 tablet by mouth daily.   clopidogrel (PLAVIX) 75 MG tablet TAKE 1 TABLET BY MOUTH EVERY DAY (Patient taking differently: Take 75 mg by mouth daily.)   Fluticasone Propionate (XHANCE) 93 MCG/ACT EXHU Place into the nose.   formoterol (PERFOROMIST) 20 MCG/2ML nebulizer solution Take 2 mLs (20 mcg total) by nebulization 2 (two) times daily.   ketoconazole (NIZORAL) 2 % cream Apply 1 application. topically daily.   metoprolol tartrate (LOPRESSOR) 50 MG tablet TAKE 1/2 TABLET BY MOUTH TWICE A DAY   montelukast (SINGULAIR) 10 MG tablet Take 1 tablet (10 mg total) by mouth at bedtime.   Multiple Vitamins-Minerals (CENTRUM) tablet Take 1 tablet by mouth daily.   pantoprazole (PROTONIX) 40 MG tablet Take 1 tablet (40 mg total) by mouth daily.   telmisartan (MICARDIS) 80 MG tablet Take 1 tablet (80 mg total) by mouth daily.     Allergies:   Aspirin, Amoxicillin, and Sulfonamide derivatives   ROS:   Please see the history of present illness.     All other systems reviewed and are negative.   Labs/Other Tests and Data Reviewed:    Recent Labs: 07/01/2021: TSH 1.17 08/03/2021: ALT 36; BUN 14; Creatinine, Ser 0.95; Magnesium 1.9; Potassium 3.9; Sodium 134 08/18/2021: Hemoglobin 14.8; Platelets 544   Recent Lipid Panel Lab Results  Component Value Date/Time   CHOL 159 06/10/2021 09:23 AM   TRIG 86 06/10/2021 09:23 AM   HDL 44 06/10/2021 09:23 AM   CHOLHDL 3.6 06/10/2021 09:23 AM   CHOLHDL 4 12/30/2020 10:34 AM   LDLCALC 99 06/10/2021 09:23 AM   LDLDIRECT 122.0 08/20/2014 09:39 AM    Wt Readings from Last 3 Encounters:  11/24/21 176 lb (79.8 kg)  09/23/21 175 lb 3.2 oz (79.5 kg)  08/13/21 176 lb 5.9 oz (80 kg)     ASSESSMENT & PLAN:    COVID-19 infection Started Paxlovid Advised to take Mucinex or Robitussin PRN for cough Tylenol as needed for fever and myalgias Continue Symbicort and as needed albuterol for asthma  Time:   Today, I have spent 9 minutes reviewing the  chart, including problem list, medications, and with the patient with telehealth technology discussing the above problems.   Medication Adjustments/Labs and Tests Ordered: Current medicines are reviewed at length with the patient today.  Concerns regarding medicines are outlined above.   Tests Ordered: No orders of the defined types were placed in this encounter.   Medication Changes: No orders of the defined types were placed in this encounter.    Note: This dictation was prepared with Dragon dictation along with smaller phrase technology. Similar sounding words can be transcribed inadequately or may not be corrected upon review. Any transcriptional errors that result from this process are unintentional.      Disposition:  Follow up  Signed, Antonio Spar, MD  03/19/2022 4:40 PM     [facility]

## 2022-03-19 NOTE — Patient Instructions (Signed)
Please take Paxlovid as prescribed.  Please take Mucinex or Robitussin as needed for cough.

## 2022-03-23 ENCOUNTER — Ambulatory Visit (INDEPENDENT_AMBULATORY_CARE_PROVIDER_SITE_OTHER): Payer: Medicare Other | Admitting: Internal Medicine

## 2022-03-23 ENCOUNTER — Encounter: Payer: Self-pay | Admitting: Internal Medicine

## 2022-03-23 DIAGNOSIS — R300 Dysuria: Secondary | ICD-10-CM

## 2022-03-23 DIAGNOSIS — U071 COVID-19: Secondary | ICD-10-CM

## 2022-03-23 LAB — POCT URINALYSIS DIP (CLINITEK)
Blood, UA: NEGATIVE
Glucose, UA: NEGATIVE mg/dL
Leukocytes, UA: NEGATIVE
Nitrite, UA: NEGATIVE
POC PROTEIN,UA: 100 — AB
Spec Grav, UA: 1.015 (ref 1.010–1.025)
Urobilinogen, UA: 0.2 E.U./dL
pH, UA: 6 (ref 5.0–8.0)

## 2022-03-23 NOTE — Progress Notes (Signed)
Virtual Visit via Telephone Note   This visit type was conducted due to national recommendations for restrictions regarding the COVID-19 Pandemic (e.g. social distancing) in an effort to limit this patient's exposure and mitigate transmission in our community.  Due to his co-morbid illnesses, this patient is at least at moderate risk for complications without adequate follow up.  This format is felt to be most appropriate for this patient at this time.  The patient did not have access to video technology/had technical difficulties with video requiring transitioning to audio format only (telephone).  All issues noted in this document were discussed and addressed.  No physical exam could be performed with this format.  Evaluation Performed:  Follow-up visit  Date:  03/23/2022   ID:  Antonio Carlson, Antonio Carlson Jun 19, 1946, MRN [id number]  Patient Location: Home Provider Location: Office/Clinic  Participants: Patient Location of Patient: Home Location of Provider: Telehealth Consent was obtain for visit to be over via telehealth. I verified that I am speaking with the correct person using two identifiers.  PCP:  Lindell Spar, MD   Chief Complaint: Dysuria  History of Present Illness:    Antonio Carlson is a 76 y.o. male who has a televisit for complaint of dysuria for the last 3 days.  He denies any hematuria, flank or pelvic pain.  Of note, he recently had COVID infection in the last week and is still taking Paxlovid.  He denies having fever, chills, nausea or vomiting now.  His dysuria has improved since this morning.  The patient does have symptoms concerning for COVID-19 infection (fever, chills, cough, or new shortness of breath).   Past Medical, Surgical, Social History, Allergies, and Medications have been Reviewed.  Past Medical History:  Diagnosis Date   Allergy    Anginal pain ([place])    Arthritis    Asthma    Benign prostatic hypertrophy    Coronary artery disease     Diverticulosis    Dyspnea    GERD (gastroesophageal reflux disease)    History of kidney stones    Hyperlipidemia    Hypertension    Nephrolithiasis    Pleural effusion on right    PVC's (premature ventricular contractions)    RECTAL BLEEDING 09/15/2010   Qualifier: Diagnosis of  By: Shane Crutch, Amy S    Syncope    Past Surgical History:  Procedure Laterality Date   APPENDECTOMY     CORONARY ARTERY BYPASS GRAFT N/A 05/01/2019   Procedure: CORONARY ARTERY BYPASS GRAFTING (CABG) TIMES FOUR USING LEFT AND RIGHT INTERNAL MAMMARY ARTERIES AND RIGHT RADIAL ARTERY WITH STERNAL PLATING;  Surgeon: [name], MD;  Location: [place];  Service: Open Heart Surgery;  Laterality: N/A;   LEFT HEART CATH AND CORONARY ANGIOGRAPHY N/A 04/18/2019   Procedure: LEFT HEART CATH AND CORONARY ANGIOGRAPHY;  Surgeon: Belva Crome, MD;  Location: [facility] [facility];  Service: Cardiovascular;  Laterality: N/A;   NASAL SINUS SURGERY  Sept 2012   RADIAL ARTERY HARVEST Right 05/01/2019   Procedure: Right Radial Artery Harvest;  Surgeon: [name], MD;  Location: [place];  Service: Open Heart Surgery;  Laterality: Right;   TEE WITHOUT CARDIOVERSION N/A 05/01/2019   Procedure: TRANSESOPHAGEAL ECHOCARDIOGRAM (TEE);  Surgeon: [name], MD;  Location: [place];  Service: Open Heart Surgery;  Laterality: N/A;     Current Meds  Medication Sig   acetaminophen (TYLENOL) 500 MG tablet Take 500-1,000 mg by mouth every 6 (six)  hours as needed for moderate pain or headache.   albuterol (VENTOLIN HFA) 108 (90 Base) MCG/ACT inhaler Inhale 2 puffs into the lungs every 6 (six) hours as needed for wheezing or shortness of breath.   atorvastatin (LIPITOR) 80 MG tablet TAKE 1 TABLET (80 MG TOTAL) BY MOUTH DAILY AT 6 PM   budesonide (PULMICORT) 0.25 MG/2ML nebulizer solution USE 1 VIAL IN NEBULIZER TWICE DAILY   budesonide-formoterol (SYMBICORT) 160-4.5 MCG/ACT inhaler Inhale 2 puffs into the lungs 2 (two) times  daily.   CALCIUM CITRATE-VITAMIN D PO Chew 1 tablet by mouth daily.   clopidogrel (PLAVIX) 75 MG tablet TAKE 1 TABLET BY MOUTH EVERY DAY (Patient taking differently: Take 75 mg by mouth daily.)   Fluticasone Propionate (XHANCE) 93 MCG/ACT EXHU Place into the nose.   formoterol (PERFOROMIST) 20 MCG/2ML nebulizer solution Take 2 mLs (20 mcg total) by nebulization 2 (two) times daily.   ketoconazole (NIZORAL) 2 % cream Apply 1 application. topically daily.   metoprolol tartrate (LOPRESSOR) 50 MG tablet TAKE 1/2 TABLET BY MOUTH TWICE A DAY   montelukast (SINGULAIR) 10 MG tablet Take 1 tablet (10 mg total) by mouth at bedtime.   Multiple Vitamins-Minerals (CENTRUM) tablet Take 1 tablet by mouth daily.   pantoprazole (PROTONIX) 40 MG tablet TAKE 1 TABLET BY MOUTH EVERY DAY   telmisartan (MICARDIS) 80 MG tablet Take 1 tablet (80 mg total) by mouth daily.   [DISCONTINUED] nirmatrelvir/ritonavir EUA (PAXLOVID) 20 x 150 MG & 10 x '100MG'$  TABS Take 3 tablets by mouth 2 (two) times daily for 5 days. (Take nirmatrelvir 150 mg two tablets twice daily for 5 days and ritonavir 100 mg one tablet twice daily for 5 days) Patient GFR is >60.     Allergies:   Aspirin, Amoxicillin, and Sulfonamide derivatives   ROS:   Please see the history of present illness.     All other systems reviewed and are negative.   Labs/Other Tests and Data Reviewed:    Recent Labs: 07/01/2021: TSH 1.17 08/03/2021: ALT 36; BUN 14; Creatinine, Ser 0.95; Magnesium 1.9; Potassium 3.9; Sodium 134 08/18/2021: Hemoglobin 14.8; Platelets 544   Recent Lipid Panel Lab Results  Component Value Date/Time   CHOL 159 06/10/2021 09:23 AM   TRIG 86 06/10/2021 09:23 AM   HDL 44 06/10/2021 09:23 AM   CHOLHDL 3.6 06/10/2021 09:23 AM   CHOLHDL 4 12/30/2020 10:34 AM   LDLCALC 99 06/10/2021 09:23 AM   LDLDIRECT 122.0 08/20/2014 09:39 AM    Wt Readings from Last 3 Encounters:  11/24/21 176 lb (79.8 kg)  09/23/21 175 lb 3.2 oz (79.5 kg)   08/13/21 176 lb 5.9 oz (80 kg)     ASSESSMENT & PLAN:    Dysuria COVID-19 infection UA reviewed - negative for LE and nitrite Dysuria improving, likely due to dehydration from acute viral infection Advised to maintain adequate hydration  Time:   Today, I have spent 9 minutes reviewing the chart, including problem list, medications, and with the patient with telehealth technology discussing the above problems.   Medication Adjustments/Labs and Tests Ordered: Current medicines are reviewed at length with the patient today.  Concerns regarding medicines are outlined above.   Tests Ordered: Orders Placed This Encounter  Procedures   POCT URINALYSIS DIP (CLINITEK)    Medication Changes: No orders of the defined types were placed in this encounter.    Note: This dictation was prepared with Dragon dictation along with smaller phrase technology. Similar sounding words can be transcribed inadequately or  may not be corrected upon review. Any transcriptional errors that result from this process are unintentional.      Disposition:  Follow up  Signed, Lindell Spar, MD  03/23/2022 4:33 PM     English

## 2022-03-23 NOTE — Patient Instructions (Signed)
Please maintain at least 64 ounces of fluid intake in a day.  Please contact us if you have persistent burning while urinating or any new symptoms.

## 2022-03-23 NOTE — Progress Notes (Unsigned)
Cardiology Clinic Note   Patient Name: Antonio Carlson Date of Encounter: 03/24/2022  Primary Care Provider:  Lindell Spar, MD Primary Cardiologist:  Pixie Casino, MD  Patient Profile    Antonio Carlson 76 year old male presents the clinic today for follow-up evaluation of his coronary artery disease and preoperative cardiac evaluation.  Past Medical History    Past Medical History:  Diagnosis Date   Allergy    Anginal pain ([place])    Arthritis    Asthma    Benign prostatic hypertrophy    Coronary artery disease    Diverticulosis    Dyspnea    GERD (gastroesophageal reflux disease)    History of kidney stones    Hyperlipidemia    Hypertension    Nephrolithiasis    Pleural effusion on right    PVC's (premature ventricular contractions)    RECTAL BLEEDING 09/15/2010   Qualifier: Diagnosis of  By: Shane Crutch, Amy S    Syncope    Past Surgical History:  Procedure Laterality Date   APPENDECTOMY     CORONARY ARTERY BYPASS GRAFT N/A 05/01/2019   Procedure: CORONARY ARTERY BYPASS GRAFTING (CABG) TIMES FOUR USING LEFT AND RIGHT INTERNAL MAMMARY ARTERIES AND RIGHT RADIAL ARTERY WITH STERNAL PLATING;  Surgeon: [name], MD;  Location: [facility];  Service: Open Heart Surgery;  Laterality: N/A;   LEFT HEART CATH AND CORONARY ANGIOGRAPHY N/A 04/18/2019   Procedure: LEFT HEART CATH AND CORONARY ANGIOGRAPHY;  Surgeon: Belva Crome, MD;  Location: [facility];  Service: Cardiovascular;  Laterality: N/A;   NASAL SINUS SURGERY  Sept 2012   RADIAL ARTERY HARVEST Right 05/01/2019   Procedure: Right Radial Artery Harvest;  Surgeon: [name], MD;  Location: [facility];  Service: Open Heart Surgery;  Laterality: Right;   TEE WITHOUT CARDIOVERSION N/A 05/01/2019   Procedure: TRANSESOPHAGEAL ECHOCARDIOGRAM (TEE);  Surgeon: [name], MD;  Location: [facility];  Service: Open Heart Surgery;  Laterality: N/A;    Allergies  Allergies  Allergen Reactions   Aspirin  Shortness Of Breath   Amoxicillin     GI upset Did it involve swelling of the face/tongue/throat, SOB, or low BP? No Did it involve sudden or severe rash/hives, skin peeling, or any reaction on the inside of your mouth or nose? No Did you need to seek medical attention at a hospital or doctor's office? No When did it last happen?    20+ years   If all above answers are "NO", may proceed with cephalosporin use.    Sulfonamide Derivatives Other (See Comments)    Doesn't remember    History of Present Illness    Antonio Carlson has a hx of CAD,CABG x4 on 05/01/2019, hypertension, syncope, angina pectoris, asthma, URI, colon polyps, rectal bleeding, osteopenia, cervical neck pain, nephrolithiasis, hyperlipidemia, and left hip pain.    He was seen virtually on 04/11/2019 by Dr. Debara Pickett  as a follow-up appointment for results from his abnormal coronary artery CT . He underwent cardiac catheterization on 04/18/2019 which showed multivessel disease.  TCTS was consulted for coronary bypass grafting and he underwent CABG x4 on 05/01/2019 (LIMA to LAD,RIMA to PDA, right radial to distal obtuse marginal, and ramus intermediate of left circumflex coronary artery.)  On 05/03/2019 he went into atrial fibrillation with RVR received IV amiodarone and was converted to SR.  He was placed on apixaban 5 mg and p.o. amiodarone.  He was doing well and discharged from the  hospital on 05/09/2019, presented to his postop follow-up visit with Dr. Orvan Seen on 10/16 and was doing well at that time.   He presented the clinic 05/25/19 and stated he felt well.  He was moving around his house and walking 1-2 times per day.  He was unable to quantify how much walking he does daily but states he is regularly ambulating throughout his house.  He stated he felt like his incisions had healed well.  He was finally sleeping better as he was  able to turn to his side and have less pain.  His medications were reviewed and he was unsure if he is  still taking potassium or not.  IWas taking 200 mg daily of amiodarone and 5 mg twice daily of his Eliquis.  He was planning on taking a vacation to his [place] home after Christmas.  Noted that he needed to be seen by Dr. Debara Pickett prior to his [place] vacation.  Ordered BMP  to evaluate electrolyte.    He was seen in follow-up by Dr. Debara Pickett on 03/12/2021.  During that time he continued to do well.  His blood pressure was initially elevated and on recheck came down to 130/78.  His weight was slightly elevated.  He was struggling with a hand issue.  He denied worsening shortness of breath.  His cholesterol showed an LDL of 94 on 6/22.  He was continued on high-dose statin and reported compliance.  He presents to the clinic today for follow-up evaluation and preoperative cardiac evaluation.  He states he feels like he is getting better/recovering from [place].  He contracted the virus 2 weeks ago after his wife was on a trip.  He continues to cough.  He denies fever and chills.  He reports negative COVID test.  He also had right hand surgery earlier in the year which she has recovered well from.  In January he had pneumonia which he feels he contracted while he was on dupixent for his allergy/asthma.  We reviewed his upcoming surgery.  We reviewed his EKG.  I will order LFTs, fasting lipids, and BMP.  We will continue his current medication regimen.  We will plan follow-up in 12 months.   Today he denies chest pain, increased shortness of breath, lower extremity edema, fatigue, palpitations, melena, hematuria, hemoptysis, diaphoresis, weakness, presyncope, syncope, orthopnea, and PND.       Home Medications    Prior to Admission medications   Medication Sig Start Date End Date Taking? Authorizing Provider  acetaminophen (TYLENOL) 500 MG tablet Take 500-1,000 mg by mouth every 6 (six) hours as needed for moderate pain or headache.    [provider]  albuterol (VENTOLIN HFA) 108 (90 Base) MCG/ACT  inhaler Inhale 2 puffs into the lungs every 6 (six) hours as needed for wheezing or shortness of breath.    [provider]  atorvastatin (LIPITOR) 80 MG tablet TAKE 1 TABLET (80 MG TOTAL) BY MOUTH DAILY AT 6 PM 03/20/22   Hilty, Nadean Corwin, MD  budesonide (PULMICORT) 0.25 MG/2ML nebulizer solution USE 1 VIAL IN NEBULIZER TWICE DAILY 01/23/22   [name], MD  budesonide-formoterol (SYMBICORT) 160-4.5 MCG/ACT inhaler Inhale 2 puffs into the lungs 2 (two) times daily. 02/27/22   [name], MD  CALCIUM CITRATE-VITAMIN D PO Chew 1 tablet by mouth daily.    [provider]  clopidogrel (PLAVIX) 75 MG tablet TAKE 1 TABLET BY MOUTH EVERY DAY Patient taking differently: Take 75 mg by mouth daily. 05/22/21  Pixie Casino, MD  Fluticasone Propionate Truett Perna) 32 MCG/ACT [place] into the nose.    [provider]  formoterol (PERFOROMIST) 20 MCG/2ML nebulizer solution Take 2 mLs (20 mcg total) by nebulization 2 (two) times daily. 02/27/22   [name], MD  ketoconazole (NIZORAL) 2 % cream Apply 1 application. topically daily. 11/24/21   Lindell Spar, MD  metoprolol tartrate (LOPRESSOR) 50 MG tablet TAKE 1/2 TABLET BY MOUTH TWICE A DAY 11/12/21   [name], MD  montelukast (SINGULAIR) 10 MG tablet Take 1 tablet (10 mg total) by mouth at bedtime. 11/27/20   [name], MD  Multiple Vitamins-Minerals (CENTRUM) tablet Take 1 tablet by mouth daily.    [provider]  nirmatrelvir/ritonavir EUA (PAXLOVID) 20 x 150 MG & 10 x '100MG'$  TABS Take 3 tablets by mouth 2 (two) times daily for 5 days. (Take nirmatrelvir 150 mg two tablets twice daily for 5 days and ritonavir 100 mg one tablet twice daily for 5 days) Patient GFR is >60. 03/19/22 03/24/22  Lindell Spar, MD  pantoprazole (PROTONIX) 40 MG tablet TAKE 1 TABLET BY MOUTH EVERY DAY 03/20/22   Hilty, Nadean Corwin, MD  telmisartan (MICARDIS) 80 MG tablet Take 1 tablet (80 mg total) by mouth daily. 05/29/21   [name], MD  rosuvastatin (CRESTOR) 20 MG tablet Take 1 tablet (20 mg total) by mouth at bedtime. 08/07/11 10/01/11  [name], MD    Family History    Family History  Problem Relation Age of Onset   Heart attack Father    He indicated that his father is deceased.  Social History    Social History   Socioeconomic History   Marital status: Married    Spouse name: Not on file   Number of children: Not on file   Years of education: Not on file   Highest education level: Not on file  Occupational History   Not on file  Tobacco Use   Smoking status: Former    Packs/day: 2.00    Years: 14.00    Total pack years: 28.00    Types: Cigarettes    Quit date: 07/27/1969    Years since quitting: 52.6   Smokeless tobacco: Never  Vaping Use   Vaping Use: Never used  Substance and Sexual Activity   Alcohol use: Yes    Comment: beer occ   Drug use: Never   Sexual activity: Yes  Other Topics Concern   Not on file  Social History Narrative   Not on file   Social Determinants of Health   Financial Resource Strain: Low Risk  (01/07/2022)   Overall Financial Resource Strain (CARDIA)    Difficulty of Paying Living Expenses: Not hard at all  Food Insecurity: No Food Insecurity (01/07/2022)   Hunger Vital Sign    Worried About Running Out of Food in the Last Year: Never true    Ran Out of Food in the Last Year: Never true  Transportation Needs: No Transportation Needs (01/07/2022)   PRAPARE - Hydrologist (Medical): No    Lack of Transportation (Non-Medical): No  Physical Activity: Sufficiently Active (01/07/2022)   Exercise Vital Sign    Days of Exercise per Week: 7 days    Minutes of Exercise per Session: 30 min  Stress: No Stress Concern Present (01/07/2022)   [place]    Feeling of Stress : Not at  all  Social Connections: Moderately Integrated (01/07/2022)   Social Connection and  Isolation Panel [NHANES]    Frequency of Communication with Friends and Family: More than three times a week    Frequency of Social Gatherings with Friends and Family: More than three times a week    Attends Religious Services: More than 4 times per year    Active Member of [employer] or Organizations: No    Attends Archivist Meetings: Never    Marital Status: Married  Human resources officer Violence: Not At Risk (01/07/2022)   Humiliation, Afraid, Rape, and Kick questionnaire    Fear of Current or Ex-Partner: No    Emotionally Abused: No    Physically Abused: No    Sexually Abused: No     Review of Systems    General:  No chills, fever, night sweats or weight changes.  Cardiovascular:  No chest pain, dyspnea on exertion, edema, orthopnea, palpitations, paroxysmal nocturnal dyspnea. Dermatological: No rash, lesions/masses Respiratory: No cough, dyspnea Urologic: No hematuria, dysuria Abdominal:   No nausea, vomiting, diarrhea, bright red blood per rectum, melena, or hematemesis Neurologic:  No visual changes, wkns, changes in mental status. All other systems reviewed and are otherwise negative except as noted above.  Physical Exam    VS:  BP 128/76   Pulse 74   Ht '5\' 8"'$  (1.727 m)   Wt 167 lb 9.6 oz (76 kg)   SpO2 97%   BMI 25.48 kg/m  , BMI Body mass index is 25.48 kg/m. GEN: Well nourished, well developed, in no acute distress. HEENT: normal. Neck: Supple, no JVD, carotid bruits, or masses. Cardiac: RRR, no murmurs, rubs, or gallops. No clubbing, cyanosis, edema.  Radials/DP/PT 2+ and equal bilaterally.  Respiratory:  Respirations regular and unlabored, clear to auscultation bilaterally. GI: Soft, nontender, nondistended, BS + x 4. MS: no deformity or atrophy. Skin: warm and dry, no rash. Neuro:  Strength and sensation are intact. Psych: Normal affect.  Accessory Clinical Findings    Recent Labs: 07/01/2021: TSH 1.17 08/03/2021: ALT 36; BUN 14; Creatinine, Ser 0.95;  Magnesium 1.9; Potassium 3.9; Sodium 134 08/18/2021: Hemoglobin 14.8; Platelets 544   Recent Lipid Panel    Component Value Date/Time   CHOL 159 06/10/2021 0923   TRIG 86 06/10/2021 0923   HDL 44 06/10/2021 0923   CHOLHDL 3.6 06/10/2021 0923   CHOLHDL 4 12/30/2020 1034   VLDL 18.8 12/30/2020 1034   LDLCALC 99 06/10/2021 0923   LDLDIRECT 122.0 08/20/2014 0939         ECG personally reviewed by me today-normal sinus rhythm no ectopy 74 bpm- No acute changes  Echocardiogram 04/25/2019  IMPRESSIONS     1. Left ventricular ejection fraction, by visual estimation, is 60 to  65%. The left ventricle has normal function. Normal left ventricular size.  There is no left ventricular hypertrophy.   2. Global right ventricle has normal systolic function.The right  ventricular size is mildly enlarged. No increase in right ventricular wall  thickness.   3. Left atrial size was normal.   4. Right atrial size was normal.   5. Mild mitral annular calcification.   6. The mitral valve is normal in structure. No evidence of mitral valve  regurgitation. No evidence of mitral stenosis.   7. The tricuspid valve is normal in structure. Tricuspid valve  regurgitation was not visualized by color flow Doppler.   8. The aortic valve is normal in structure. Aortic valve regurgitation is  trivial by color flow  Doppler. Structurally normal aortic valve, with no  evidence of sclerosis or stenosis.   9. The pulmonic valve was normal in structure. Pulmonic valve  regurgitation is not visualized by color flow Doppler.  10. The inferior vena cava is normal in size with greater than 50%  respiratory variability, suggesting right atrial pressure of 3 mmHg.   Assessment & Plan   1.  Coronary artery disease-status post CABG x4 ,04/2019.  Denies recent episodes of chest discomfort.  Remains physically active. Continue atorvastatin, clopidogrel, metoprolol Heart healthy low-sodium diet-salty 6  reviewed Increase physical activity as tolerated  Hyperlipidemia-LDL 99 on 06/10/2021.  Reports compliance with statin therapy. Continue atorvastatin High-fiber diet Maintain physical activity Repeat fasting lipids and LFTs 11/23  Essential hypertension-BP today 128/76. Continue metoprolol, telmisartan Maintain blood pressure log Increase physical activity as tolerated  Preoperative cardiac evaluation-total hip arthroplasty 06/24/2022.  Dr. Gaynelle Arabian    Primary Cardiologist: Pixie Casino, MD  Chart reviewed as part of pre-operative protocol coverage. Given past medical history and time since last visit, based on ACC/AHA guidelines, ROBERTS BON would be at acceptable risk for the planned procedure without further cardiovascular testing.   Patient was advised that if he develops new symptoms prior to surgery to contact our office to arrange a follow-up appointment.  He verbalized understanding.  He is able to complete greater than 4 METS of physical activity.  His RCRI is a class II risk, 0.9% risk of major cardiac event.   Disposition: Follow-up with Dr. Debara Pickett in 32 months   [name]. Trigg Delarocha NP-C     03/24/2022, 8:27 AM [address] Office [phone] Fax [phone]  Notice: This dictation was prepared with Dragon dictation along with smaller phrase technology. Any transcriptional errors that result from this process are unintentional and may not be corrected upon review.  I spent 14 minutes examining this patient, reviewing medications, and using patient centered shared decision making involving her cardiac care.  Prior to her visit I spent greater than 20 minutes reviewing her past medical history,  medications, and prior cardiac tests.

## 2022-03-24 ENCOUNTER — Ambulatory Visit: Payer: Medicare Other | Attending: General Practice | Admitting: General Practice

## 2022-03-24 ENCOUNTER — Encounter: Payer: Self-pay | Admitting: General Practice

## 2022-03-24 VITALS — BP 128/76 | HR 74 | Ht 68.0 in | Wt 167.6 lb

## 2022-03-24 DIAGNOSIS — Z951 Presence of aortocoronary bypass graft: Secondary | ICD-10-CM | POA: Diagnosis not present

## 2022-03-24 DIAGNOSIS — I480 Paroxysmal atrial fibrillation: Secondary | ICD-10-CM

## 2022-03-24 DIAGNOSIS — Z01810 Encounter for preprocedural cardiovascular examination: Secondary | ICD-10-CM

## 2022-03-24 DIAGNOSIS — I2510 Atherosclerotic heart disease of native coronary artery without angina pectoris: Secondary | ICD-10-CM | POA: Diagnosis not present

## 2022-03-24 DIAGNOSIS — E785 Hyperlipidemia, unspecified: Secondary | ICD-10-CM | POA: Diagnosis not present

## 2022-03-24 DIAGNOSIS — I10 Essential (primary) hypertension: Secondary | ICD-10-CM | POA: Diagnosis not present

## 2022-03-24 LAB — BASIC METABOLIC PANEL
BUN/Creatinine Ratio: 15 (ref 10–24)
BUN: 15 mg/dL (ref 8–27)
CO2: 31 mmol/L — ABNORMAL HIGH (ref 20–29)
Calcium: 9.6 mg/dL (ref 8.6–10.2)
Chloride: 100 mmol/L (ref 96–106)
Creatinine, Ser: 0.98 mg/dL (ref 0.76–1.27)
Glucose: 101 mg/dL — ABNORMAL HIGH (ref 70–99)
Potassium: 5.1 mmol/L (ref 3.5–5.2)
Sodium: 143 mmol/L (ref 134–144)
eGFR: 79 mL/min/{1.73_m2} (ref >59)

## 2022-03-24 LAB — HEPATIC FUNCTION PANEL
ALT: 25 IU/L (ref 0–44)
AST: 28 IU/L (ref 0–40)
Albumin: 4.4 g/dL (ref 3.8–4.8)
Alkaline Phosphatase: 136 IU/L — ABNORMAL HIGH (ref 44–121)
Bilirubin Total: 0.4 mg/dL (ref 0.0–1.2)
Bilirubin, Direct: 0.15 mg/dL (ref 0.00–0.40)
Total Protein: 6.9 g/dL (ref 6.0–8.5)

## 2022-03-24 LAB — LIPID PANEL
Chol/HDL Ratio: 3.7 ratio (ref 0.0–5.0)
Cholesterol, Total: 178 mg/dL (ref 100–199)
HDL: 48 mg/dL (ref >39)
LDL Chol Calc (NIH): 112 mg/dL — ABNORMAL HIGH (ref 0–99)
Triglycerides: 98 mg/dL (ref 0–149)
VLDL Cholesterol Cal: 18 mg/dL (ref 5–40)

## 2022-03-24 NOTE — Patient Instructions (Signed)
Medication Instructions:  The current medical regimen is effective;  continue present plan and medications as directed. Please refer to the Current Medication list given to you today.   *If you need a refill on your cardiac medications before your next appointment, please call your pharmacy*  Lab Work:    FASTING LIPID, LFT AND BMET     If you have labs (blood work) drawn today and your tests are completely normal, you will receive your results only by:  1-MyChart Message (if you have MyChart) OR  2-A paper copy in the mail.  If you have any lab test that is abnormal or we need to change your treatment, we will call you to review the results.  You may also go to any of these [facility] locations:  [id number],  [place] [address] ([facility])  3- [address]  [place] [address] [place] [employer], newspaper)  Special Instructions WE WILL NOTIFY SURGEON YOU ARE CLEARED  Follow-Up: Your next appointment:  12 month(s) In Person with Pixie Casino, MD  or Coletta Memos, FNP   OR ANY APP  Please call our office 2 months in advance to schedule this appointment   At [facility], you and your health needs are our priority.  As part of our continuing mission to provide you with exceptional heart care, we have created designated Provider Care Teams.  These Care Teams include your primary Cardiologist (physician) and Advanced Practice Providers (APPs -  Physician Assistants and Nurse Practitioners) who all work together to provide you with the care you need, when you need it.  We recommend signing up for the patient portal called "MyChart".  Sign up information is provided on this After Visit Summary.  MyChart is used to connect with  patients for Virtual Visits (Telemedicine).  Patients are able to view lab/test results, encounter notes, upcoming appointments, etc.  Non-urgent messages can be sent to your provider as well.   To learn more about what you can do with MyChart, go to [email or web address].    Important Information About Sugar

## 2022-03-25 ENCOUNTER — Telehealth: Payer: Self-pay | Admitting: Internal Medicine

## 2022-03-25 ENCOUNTER — Other Ambulatory Visit: Payer: Self-pay

## 2022-03-25 DIAGNOSIS — Z79899 Other long term (current) drug therapy: Secondary | ICD-10-CM

## 2022-03-25 DIAGNOSIS — E785 Hyperlipidemia, unspecified: Secondary | ICD-10-CM

## 2022-03-25 DIAGNOSIS — I2510 Atherosclerotic heart disease of native coronary artery without angina pectoris: Secondary | ICD-10-CM

## 2022-03-25 MED ORDER — EZETIMIBE 10 MG PO TABS: 10.0000 mg | ORAL_TABLET | Freq: Every day | ORAL | 3 refills | Status: DC

## 2022-03-25 NOTE — Telephone Encounter (Signed)
New Message:     Patient says he needs to talk to Dr [facility] nurse about his medication list. He says he needs to make a correction on it.

## 2022-03-25 NOTE — Telephone Encounter (Signed)
Patient stated pulmonary medications were not correct in his chart. Recommended that he bring pulmonary medications in so our clinic can update them in his chart. He said he would when he gets the time.

## 2022-03-26 ENCOUNTER — Other Ambulatory Visit: Payer: Self-pay

## 2022-03-26 ENCOUNTER — Telehealth: Payer: Self-pay | Admitting: Internal Medicine

## 2022-03-26 MED ORDER — METOPROLOL TARTRATE 50 MG PO TABS: 25.0000 mg | ORAL_TABLET | Freq: Two times a day (BID) | ORAL | 0 refills | Status: DC

## 2022-03-26 MED ORDER — MONTELUKAST SODIUM 10 MG PO TABS: 10.0000 mg | ORAL_TABLET | Freq: Every day | ORAL | 4 refills | Status: DC

## 2022-03-26 NOTE — Telephone Encounter (Signed)
Patient needs refills on   montelukast (SINGULAIR) 10 MG tablet   metoprolol tartrate (LOPRESSOR) 50 MG tablet

## 2022-03-26 NOTE — Telephone Encounter (Signed)
Called and spoke with pt letting him know that PCP can take care of refilling both montelukast and metoprolol. Stated to him at last OV with Dr. Melvyn Novas he was told to follow up prn. Stated to him that PCP should be able to refill meds and he verbalized understanding. Nothing further needed.

## 2022-04-01 ENCOUNTER — Encounter: Payer: Self-pay | Admitting: Internal Medicine

## 2022-04-01 ENCOUNTER — Ambulatory Visit (INDEPENDENT_AMBULATORY_CARE_PROVIDER_SITE_OTHER): Payer: Medicare Other | Admitting: Internal Medicine

## 2022-04-01 VITALS — BP 118/70 | HR 76 | Resp 18 | Ht 68.0 in | Wt 171.6 lb

## 2022-04-01 DIAGNOSIS — E782 Mixed hyperlipidemia: Secondary | ICD-10-CM | POA: Diagnosis not present

## 2022-04-01 DIAGNOSIS — Z01818 Encounter for other preprocedural examination: Secondary | ICD-10-CM | POA: Diagnosis not present

## 2022-04-01 DIAGNOSIS — I10 Essential (primary) hypertension: Secondary | ICD-10-CM

## 2022-04-01 DIAGNOSIS — Z951 Presence of aortocoronary bypass graft: Secondary | ICD-10-CM

## 2022-04-01 DIAGNOSIS — M1610 Unilateral primary osteoarthritis, unspecified hip: Secondary | ICD-10-CM | POA: Diagnosis not present

## 2022-04-01 MED ORDER — EZETIMIBE 10 MG PO TABS: 10.0000 mg | ORAL_TABLET | Freq: Every day | ORAL | 3 refills | Status: DC

## 2022-04-01 NOTE — Assessment & Plan Note (Signed)
On statin Added Zetia after checking lipid profile

## 2022-04-01 NOTE — Patient Instructions (Signed)
Please continue taking medications as prescribed.  Please hold Clopidogrel 5 days before the procedure.  Please continue to follow low salt diet and ambulate as tolerated.

## 2022-04-01 NOTE — Progress Notes (Signed)
Established Patient Office Visit  Subjective:  Patient ID: Antonio Carlson, male    DOB: [birth year]  Age: 76 y.o. MRN: [id number]  CC:  Chief Complaint  Patient presents with   Follow-up    Follow up CAD and surgery clearance is due for left hip surgery 11-29    HPI Antonio Carlson is a 76 y.o. male with past medical history of HTN, CAD s/p CABG, asthma, allergic rhinitis and OA of hip who presents for f/u of his chronic medical conditions and preop evaluation for hip arthroplasty.  HTN: BP is well-controlled. Takes medications regularly. Patient denies headache, dizziness, chest pain, dyspnea or palpitations.   CAD s/p CABG: He is on Plavix and statin currently.  He also takes metoprolol for it.  He denies any anginal chest pain or dyspnea currently.  He has history of asthma and allergic rhinitis, for which he uses Perforomist and Pulmicort neb.  He denies any dyspnea or wheezing currently.  He follows up with Dr Melvyn Novas.  He has tried Dupixent, but had an allergic reaction to it.  He has history of OA of hip, for which he sees Dr. Maureen Ralphs. He is going to get THA.    Past Medical History:  Diagnosis Date   Allergy    Anginal pain ([place])    Arthritis    Asthma    Benign prostatic hypertrophy    Coronary artery disease    Diverticulosis    Dyspnea    GERD (gastroesophageal reflux disease)    History of kidney stones    Hyperlipidemia    Hypertension    Nephrolithiasis    Pleural effusion on right    PVC's (premature ventricular contractions)    RECTAL BLEEDING 09/15/2010   Qualifier: Diagnosis of  By: Shane Crutch, Amy S    Syncope     Past Surgical History:  Procedure Laterality Date   APPENDECTOMY     CORONARY ARTERY BYPASS GRAFT N/A 05/01/2019   Procedure: CORONARY ARTERY BYPASS GRAFTING (CABG) TIMES FOUR USING LEFT AND RIGHT INTERNAL MAMMARY ARTERIES AND RIGHT RADIAL ARTERY WITH STERNAL PLATING;  Surgeon: [name], MD;  Location: [place];  Service: Open Heart  Surgery;  Laterality: N/A;   LEFT HEART CATH AND CORONARY ANGIOGRAPHY N/A 04/18/2019   Procedure: LEFT HEART CATH AND CORONARY ANGIOGRAPHY;  Surgeon: Belva Crome, MD;  Location: [facility];  Service: Cardiovascular;  Laterality: N/A;   NASAL SINUS SURGERY  Sept 2012   RADIAL ARTERY HARVEST Right 05/01/2019   Procedure: Right Radial Artery Harvest;  Surgeon: [name], MD;  Location: [place];  Service: Open Heart Surgery;  Laterality: Right;   TEE WITHOUT CARDIOVERSION N/A 05/01/2019   Procedure: TRANSESOPHAGEAL ECHOCARDIOGRAM (TEE);  Surgeon: [name], MD;  Location: [place];  Service: Open Heart Surgery;  Laterality: N/A;    Family History  Problem Relation Age of Onset   Heart attack Father     Social History   Socioeconomic History   Marital status: Married    Spouse name: Not on file   Number of children: Not on file   Years of education: Not on file   Highest education level: Not on file  Occupational History   Not on file  Tobacco Use   Smoking status: Former    Packs/day: 2.00    Years: 14.00    Total pack years: 28.00    Types: Cigarettes    Quit date: 07/27/1969    Years since  quitting: 52.7   Smokeless tobacco: Never  Vaping Use   Vaping Use: Never used  Substance and Sexual Activity   Alcohol use: Yes    Comment: beer occ   Drug use: Never   Sexual activity: Yes  Other Topics Concern   Not on file  Social History Narrative   Not on file   Social Determinants of Health   Financial Resource Strain: Low Risk  (01/07/2022)   Overall Financial Resource Strain (CARDIA)    Difficulty of Paying Living Expenses: Not hard at all  Food Insecurity: No Food Insecurity (01/07/2022)   Hunger Vital Sign    Worried About Running Out of Food in the Last Year: Never true    Ran Out of Food in the Last Year: Never true  Transportation Needs: No Transportation Needs (01/07/2022)   PRAPARE - Hydrologist (Medical): No    Lack  of Transportation (Non-Medical): No  Physical Activity: Sufficiently Active (01/07/2022)   Exercise Vital Sign    Days of Exercise per Week: 7 days    Minutes of Exercise per Session: 30 min  Stress: No Stress Concern Present (01/07/2022)   [place]    Feeling of Stress : Not at all  Social Connections: Moderately Integrated (01/07/2022)   Social Connection and Isolation Panel [NHANES]    Frequency of Communication with Friends and Family: More than three times a week    Frequency of Social Gatherings with Friends and Family: More than three times a week    Attends Religious Services: More than 4 times per year    Active Member of [employer] or Organizations: No    Attends Archivist Meetings: Never    Marital Status: Married  Human resources officer Violence: Not At Risk (01/07/2022)   Humiliation, Afraid, Rape, and Kick questionnaire    Fear of Current or Ex-Partner: No    Emotionally Abused: No    Physically Abused: No    Sexually Abused: No    Outpatient Medications Prior to Visit  Medication Sig Dispense Refill   acetaminophen (TYLENOL) 500 MG tablet Take 500-1,000 mg by mouth every 6 (six) hours as needed for moderate pain or headache.     atorvastatin (LIPITOR) 80 MG tablet TAKE 1 TABLET (80 MG TOTAL) BY MOUTH DAILY AT 6 PM 90 tablet 0   budesonide (PULMICORT) 0.25 MG/2ML nebulizer solution USE 1 VIAL IN NEBULIZER TWICE DAILY 120 mL 11   budesonide-formoterol (SYMBICORT) 160-4.5 MCG/ACT inhaler Inhale 2 puffs into the lungs 2 (two) times daily. 1 each 12   CALCIUM CITRATE-VITAMIN D PO Chew 1 tablet by mouth daily.     clopidogrel (PLAVIX) 75 MG tablet TAKE 1 TABLET BY MOUTH EVERY DAY (Patient taking differently: Take 75 mg by mouth daily.) 90 tablet 3   metoprolol tartrate (LOPRESSOR) 50 MG tablet Take 0.5 tablets (25 mg total) by mouth 2 (two) times daily. 90 tablet 0   montelukast (SINGULAIR) 10 MG tablet Take 1  tablet (10 mg total) by mouth at bedtime. 90 tablet 4   Multiple Vitamins-Minerals (CENTRUM) tablet Take 1 tablet by mouth daily.     pantoprazole (PROTONIX) 40 MG tablet TAKE 1 TABLET BY MOUTH EVERY DAY 90 tablet 2   telmisartan (MICARDIS) 80 MG tablet Take 1 tablet (80 mg total) by mouth daily. 90 tablet 3   ezetimibe (ZETIA) 10 MG tablet Take 1 tablet (10 mg total) by mouth daily. 30 tablet 3  albuterol (VENTOLIN HFA) 108 (90 Base) MCG/ACT inhaler Inhale 2 puffs into the lungs every 6 (six) hours as needed for wheezing or shortness of breath. (Patient not taking: Reported on 04/01/2022)     No facility-administered medications prior to visit.    Allergies  Allergen Reactions   Aspirin Shortness Of Breath   Amoxicillin     GI upset Did it involve swelling of the face/tongue/throat, SOB, or low BP? No Did it involve sudden or severe rash/hives, skin peeling, or any reaction on the inside of your mouth or nose? No Did you need to seek medical attention at a hospital or doctor's office? No When did it last happen?    20+ years   If all above answers are "NO", may proceed with cephalosporin use.    Sulfonamide Derivatives Other (See Comments)    Doesn't remember    ROS Review of Systems  Constitutional:  Negative for chills and fever.  HENT:  Negative for congestion and sore throat.   Eyes:  Negative for pain and discharge.  Respiratory:  Negative for cough and shortness of breath.   Cardiovascular:  Negative for chest pain and palpitations.  Gastrointestinal:  Negative for diarrhea, nausea and vomiting.  Endocrine: Negative for polydipsia and polyuria.  Genitourinary:  Negative for dysuria and hematuria.  Musculoskeletal:  Positive for arthralgias and back pain. Negative for neck pain and neck stiffness.  Skin:  Negative for rash.  Neurological:  Positive for numbness. Negative for dizziness, weakness and headaches.  Psychiatric/Behavioral:  Negative for agitation and behavioral  problems.       Objective:    Physical Exam Vitals reviewed.  Constitutional:      General: He is not in acute distress.    Appearance: He is not diaphoretic.  HENT:     Head: Normocephalic and atraumatic.     Nose: Nose normal.     Mouth/Throat:     Mouth: Mucous membranes are moist.  Eyes:     General: No scleral icterus.    Extraocular Movements: Extraocular movements intact.  Cardiovascular:     Rate and Rhythm: Normal rate and regular rhythm.     Pulses: Normal pulses.     Heart sounds: Normal heart sounds. No murmur heard. Pulmonary:     Breath sounds: Normal breath sounds. No wheezing or rales.  Musculoskeletal:     Cervical back: Neck supple. No tenderness.     Right lower leg: No edema.     Left lower leg: No edema.  Skin:    General: Skin is warm.     Findings: No rash.  Neurological:     General: No focal deficit present.     Mental Status: He is alert and oriented to person, place, and time.  Psychiatric:        Mood and Affect: Mood normal.        Behavior: Behavior normal.     BP 118/70 (BP Location: Right Arm, Patient Position: Sitting, Cuff Size: Normal)   Pulse 76   Resp 18   Ht 5' 8"  (1.727 m)   Wt 171 lb 9.6 oz (77.8 kg)   SpO2 98%   BMI 26.09 kg/m  Wt Readings from Last 3 Encounters:  04/01/22 171 lb 9.6 oz (77.8 kg)  03/24/22 167 lb 9.6 oz (76 kg)  11/24/21 176 lb (79.8 kg)    Lab Results  Component Value Date   TSH 1.17 07/01/2021   Lab Results  Component Value Date   WBC  14.0 (H) 08/18/2021   HGB 14.8 08/18/2021   HCT 45.8 08/18/2021   MCV 83 08/18/2021   PLT 544 (H) 08/18/2021   Lab Results  Component Value Date   NA 143 03/24/2022   K 5.1 03/24/2022   CO2 31 (H) 03/24/2022   GLUCOSE 101 (H) 03/24/2022   BUN 15 03/24/2022   CREATININE 0.98 03/24/2022   BILITOT 0.4 03/24/2022   ALKPHOS 136 (H) 03/24/2022   AST 28 03/24/2022   ALT 25 03/24/2022   PROT 6.9 03/24/2022   ALBUMIN 4.4 03/24/2022   CALCIUM 9.6 03/24/2022    ANIONGAP 10 08/03/2021   EGFR 79 03/24/2022   GFR 78.99 07/01/2021   Lab Results  Component Value Date   CHOL 178 03/24/2022   Lab Results  Component Value Date   HDL 48 03/24/2022   Lab Results  Component Value Date   LDLCALC 112 (H) 03/24/2022   Lab Results  Component Value Date   TRIG 98 03/24/2022   Lab Results  Component Value Date   CHOLHDL 3.7 03/24/2022   Lab Results  Component Value Date   HGBA1C 5.8 (H) 04/27/2019      Assessment & Plan:   Problem List Items Addressed This Visit       Cardiovascular and Mediastinum   Essential hypertension (Chronic)    BP Readings from Last 1 Encounters:  04/01/22 118/70  Well-controlled with Telmisartan 80 mg QD and Metoprolol 50 mg QD Counseled for compliance with the medications Advised DASH diet and moderate exercise/walking as tolerated       Relevant Medications   ezetimibe (ZETIA) 10 MG tablet     Musculoskeletal and Integument   Hip arthritis    Tylenol as needed Followed by Orthopedic surgery - plan to get THA        Other   Hyperlipidemia    On statin Added Zetia after checking lipid profile      Relevant Medications   ezetimibe (ZETIA) 10 MG tablet   S/P CABG x 4    Status post CABG On Plavix and metoprolol Denies any anginal pain or dyspnea currently Followed by cardiology      Preop examination - Primary    Planning to get THA RCRI: 1 DASI: 4.61 METs  BP wnl, no anginal symptoms EKG done at Cardiology office: Sinus rhythm. No signs of active ischemia.  Medically optimized with moderate risk for THA Hold Plavix 5 days before the procedure and restart soon after procedure       Meds ordered this encounter  Medications   ezetimibe (ZETIA) 10 MG tablet    Sig: Take 1 tablet (10 mg total) by mouth daily.    Dispense:  90 tablet    Refill:  3    Follow-up: Return in about 4 months (around 08/01/2022).    Lindell Spar, MD

## 2022-04-01 NOTE — Telephone Encounter (Signed)
OV notes and clearance form have been faxed back to [facility]. Nothing further needed at this time. ?

## 2022-04-01 NOTE — Assessment & Plan Note (Signed)
Tylenol as needed Followed by Orthopedic surgery - plan to get THA

## 2022-04-01 NOTE — Assessment & Plan Note (Signed)
Planning to get THA RCRI: 1 DASI: 4.61 METs  BP wnl, no anginal symptoms EKG done at Cardiology office: Sinus rhythm. No signs of active ischemia.  Medically optimized with moderate risk for THA Hold Plavix 5 days before the procedure and restart soon after procedure

## 2022-04-01 NOTE — Assessment & Plan Note (Signed)
BP Readings from Last 1 Encounters:  04/01/22 118/70   Well-controlled with Telmisartan 80 mg QD and Metoprolol 50 mg QD Counseled for compliance with the medications Advised DASH diet and moderate exercise/walking as tolerated

## 2022-04-01 NOTE — Assessment & Plan Note (Signed)
Status post CABG On Plavix and metoprolol Denies any anginal pain or dyspnea currently Followed by cardiology 

## 2022-04-03 ENCOUNTER — Other Ambulatory Visit: Payer: Self-pay | Admitting: Internal Medicine

## 2022-04-07 ENCOUNTER — Telehealth: Payer: Self-pay | Admitting: Internal Medicine

## 2022-04-10 MED ORDER — BUDESONIDE 0.5 MG/2ML IN SUSP: 0.5000 mg | Freq: Every day | RESPIRATORY_TRACT | 12 refills | Status: DC

## 2022-04-10 NOTE — Telephone Encounter (Signed)
Pt is having trouble getting budesonide 2.5. States pharmacy could fill at .5 though. Needs to know if that is okay to switch to or if MW has any other suggestions.   Please advise sir

## 2022-04-10 NOTE — Telephone Encounter (Signed)
Medication sent in with Dx code. Nothing further needed

## 2022-04-10 NOTE — Telephone Encounter (Signed)
Pt is calling back again about this medicine needs a new rx for this with the diagnosis code on it for the budesinide

## 2022-04-10 NOTE — Telephone Encounter (Signed)
Ok to change to pulmicort 0.5 mg  once daily

## 2022-04-13 ENCOUNTER — Telehealth: Payer: Self-pay | Admitting: Internal Medicine

## 2022-04-15 NOTE — Telephone Encounter (Signed)
Patient is calling back to get clarification on budesonide solution dosage. Call home phone first, [phone] then cell phone, [phone].

## 2022-04-15 NOTE — Telephone Encounter (Signed)
Called and left voicemail for patient that the pharmacy was only able to get the 0.5 mg solution in stock but its the same dosage. Nothing further needed

## 2022-04-15 NOTE — Telephone Encounter (Signed)
Called and spoke to patient and went over instructions of medication with patient. Nothing further needed

## 2022-04-17 ENCOUNTER — Telehealth: Payer: Self-pay | Admitting: *Deleted

## 2022-04-17 NOTE — Chronic Care Management (AMB) (Unsigned)
  Care Coordination  Outreach Note  04/17/2022 Name: GABRIEN MENTINK MRN: [id number] DOB: Jul 05, 1946   Care Coordination Outreach Attempts: An unsuccessful telephone outreach was attempted today to offer the patient information about available care coordination services as a benefit of their health plan.   Follow Up Plan:  Additional outreach attempts will be made to offer the patient care coordination information and services.   Encounter Outcome:  No Answer  [place]  Direct Dial: [phone]

## 2022-04-20 NOTE — Chronic Care Management (AMB) (Signed)
  Care Coordination   Note   04/20/2022 Name: Antonio Carlson MRN: [id number] DOB: [birth year]  Antonio Carlson is a 76 y.o. year old male who sees Lindell Spar, MD for primary care. I reached out to [name] by phone today to offer care coordination services.  Mr. Nudelman was given information about Care Coordination services today including:   The Care Coordination services include support from the care team which includes your Nurse Coordinator, Clinical Social Worker, or Pharmacist.  The Care Coordination team is here to help remove barriers to the health concerns and goals most important to you. Care Coordination services are voluntary, and the patient may decline or stop services at any time by request to their care team member.   Care Coordination Consent Status: Patient agreed to services and verbal consent obtained.   Follow up plan:  Telephone appointment with care coordination team member scheduled for:  04/29/22  Encounter Outcome:  Pt. Scheduled  [place]  Direct Dial: [phone]

## 2022-04-29 ENCOUNTER — Ambulatory Visit: Payer: Self-pay

## 2022-04-29 NOTE — Patient Outreach (Signed)
  Care Coordination   04/29/2022 Name: Antonio Carlson MRN: [id number] DOB: March 10, 1946   Care Coordination Outreach Attempts:  An unsuccessful telephone outreach was attempted today to offer the patient information about available care coordination services as a benefit of their health plan.   Follow Up Plan:  Additional outreach attempts will be made to offer the patient care coordination information and services.   Encounter Outcome:  No Answer  Care Coordination Interventions Activated:  No   Care Coordination Interventions:  No, not indicated     Lazaro Arms RN, BSN, [facility]   Phone: [phone]

## 2022-05-08 ENCOUNTER — Telehealth: Payer: Self-pay | Admitting: *Deleted

## 2022-05-08 NOTE — Chronic Care Management (AMB) (Signed)
  Care Coordination Note  05/08/2022 Name: IAM LIPSON MRN: [id number] DOB: 1946/03/24  Antonio Carlson is a 76 y.o. year old male who is a primary care patient of Lindell Spar, MD and is actively engaged with the care management team. I reached out to [name] by phone today to assist with re-scheduling an initial visit with the RN Case Manager  Follow up plan: Patient declines further follow up and engagement by the care management team. Appropriate care team members and provider have been notified via electronic communication.   [place]  Direct Dial: [phone]

## 2022-05-11 ENCOUNTER — Encounter: Payer: Self-pay | Admitting: Internal Medicine

## 2022-05-11 ENCOUNTER — Ambulatory Visit (INDEPENDENT_AMBULATORY_CARE_PROVIDER_SITE_OTHER): Payer: Medicare Other | Admitting: Internal Medicine

## 2022-05-11 VITALS — BP 150/68 | HR 81 | Ht 68.0 in | Wt 173.0 lb

## 2022-05-11 DIAGNOSIS — M1610 Unilateral primary osteoarthritis, unspecified hip: Secondary | ICD-10-CM | POA: Diagnosis not present

## 2022-05-11 DIAGNOSIS — J302 Other seasonal allergic rhinitis: Secondary | ICD-10-CM | POA: Diagnosis not present

## 2022-05-11 DIAGNOSIS — J339 Nasal polyp, unspecified: Secondary | ICD-10-CM

## 2022-05-11 DIAGNOSIS — R350 Frequency of micturition: Secondary | ICD-10-CM | POA: Diagnosis not present

## 2022-05-11 DIAGNOSIS — Z886 Allergy status to analgesic agent status: Secondary | ICD-10-CM | POA: Diagnosis not present

## 2022-05-11 DIAGNOSIS — J45909 Unspecified asthma, uncomplicated: Secondary | ICD-10-CM | POA: Diagnosis not present

## 2022-05-11 MED ORDER — PREDNISONE 10 MG PO TABS: ORAL_TABLET | ORAL | 0 refills | Status: AC

## 2022-05-11 MED ORDER — TRAMADOL HCL 50 MG PO TABS: 50.0000 mg | ORAL_TABLET | Freq: Two times a day (BID) | ORAL | 0 refills | Status: AC | PRN

## 2022-05-11 NOTE — Progress Notes (Unsigned)
e  Acute Office Visit  Subjective:     Patient ID: Antonio Carlson, male    DOB: 19-Nov-1945, 76 y.o.   MRN: [id number]  Chief Complaint  Patient presents with   Hip Pain    Left hip pain getting worse. 01/24/2022   Medication Refill   Antonio Carlson presents today for an acute visit for left hip pain.  He has a known history of osteoarthritis of the left hip and will undergo THA on 06/24/2022 with Dr. Wynelle Link.  For now he has been managing his pain with Tylenol and tramadol.  He is currently out of tramadol and states that his pain is getting worse.  His pain is located along the anterior aspect of the left hip.  He requests medication refills today.  He is accompanied by his wife, who also notes that he has had increased urinary frequency.  She states that her siste was previously vitamin B12 deficient and displayed symptoms of increased urinary frequency.  She requests that we check Antonio Carlson's B12 level today as well as a UA.  Review of Systems  Genitourinary:  Positive for frequency. Negative for dysuria, flank pain and hematuria.  Musculoskeletal:  Positive for joint pain (Left hip pain).      Objective:    BP (!) 150/68   Pulse 81   Ht '5\' 8"'$  (1.727 m)   Wt 173 lb (78.5 kg)   SpO2 95%   BMI 26.30 kg/m    Physical Exam Vitals reviewed.  Constitutional:      General: He is not in acute distress.    Appearance: Normal appearance. He is not ill-appearing.  HENT:     Head: Normocephalic and atraumatic.     Nose: Nose normal. No congestion or rhinorrhea.     Mouth/Throat:     Mouth: Mucous membranes are moist.     Pharynx: Oropharynx is clear.  Eyes:     Extraocular Movements: Extraocular movements intact.     Conjunctiva/sclera: Conjunctivae normal.     Pupils: Pupils are equal, round, and reactive to light.  Cardiovascular:     Rate and Rhythm: Normal rate and regular rhythm.     Pulses: Normal pulses.     Heart sounds: Normal heart sounds. No murmur heard. Pulmonary:      Effort: Pulmonary effort is normal.     Breath sounds: Normal breath sounds. No wheezing, rhonchi or rales.  Abdominal:     General: Abdomen is flat. Bowel sounds are normal. There is no distension.     Palpations: Abdomen is soft.     Tenderness: There is no abdominal tenderness.  Musculoskeletal:     Cervical back: Normal range of motion.     Comments: Limited ROM of the left hip.  Positive logroll on the left lower extremity.  Skin:    General: Skin is warm and dry.     Capillary Refill: Capillary refill takes less than 2 seconds.  Neurological:     General: No focal deficit present.     Mental Status: He is alert and oriented to person, place, and time.     Motor: No weakness.  Psychiatric:        Mood and Affect: Mood normal.        Behavior: Behavior normal.        Thought Content: Thought content normal.       Assessment & Plan:   Problem List Items Addressed This Visit     [place] asthma - Primary (  Chronic)    He endorses allergy/asthma symptoms today that are periodically treated with a brief prednisone taper by his pulmonologist.  He requests a refill of prednisone today.      Hip arthritis    Known history of left hip osteoarthritis.  He is followed by Dr. Wynelle Link ([facility]) and will undergo THA on 11/29.  He is currently out of tramadol and states that his hip pain is getting worse. -I recommended that Antonio Carlson increase Tylenol frequency to every 6 hours.  He is currently taking 500 mg at a time. -Tramadol refilled today.  He will follow-up with orthopedic surgery later this month.      Increased urinary frequency    His wife reports that he has been urinating more frequently as of late.  She is concerned that he is B12 deficient because her sister was previously B12 deficient. -UA ordered today -B12 level ordered at the patient's request       Meds ordered this encounter  Medications   predniSONE (DELTASONE) 10 MG tablet    Sig: Take 4 tablets (40  mg total) by mouth daily with breakfast for 2 days, THEN 2 tablets (20 mg total) daily with breakfast for 2 days, THEN 1 tablet (10 mg total) daily with breakfast for 2 days.    Dispense:  14 tablet    Refill:  0   traMADol (ULTRAM) 50 MG tablet    Sig: Take 1 tablet (50 mg total) by mouth every 12 (twelve) hours as needed for up to 10 days for severe pain.    Dispense:  20 tablet    Refill:  0    Return if symptoms worsen or fail to improve.  Johnette Abraham, MD

## 2022-05-11 NOTE — Patient Instructions (Signed)
It was a pleasure to see you today.  Thank you for giving [place] the opportunity to be involved in your care.  Below is a brief recap of your visit and next steps.  We will plan to see you again in January.  Summary I recommend taking Tylenol every 6 hours for pain. I have refilled tramadol today I have prescribed prednisone taper x 6 days for your sinus issues  Good luck with your surgery We will see you in January

## 2022-05-12 DIAGNOSIS — R350 Frequency of micturition: Secondary | ICD-10-CM | POA: Insufficient documentation

## 2022-05-12 LAB — UA/M W/RFLX CULTURE, ROUTINE
Bilirubin, UA: NEGATIVE
Glucose, UA: NEGATIVE
Ketones, UA: NEGATIVE
Leukocytes,UA: NEGATIVE
Nitrite, UA: NEGATIVE
Protein,UA: NEGATIVE
RBC, UA: NEGATIVE
Specific Gravity, UA: 1.021 (ref 1.005–1.030)
Urobilinogen, Ur: 0.2 mg/dL (ref 0.2–1.0)
pH, UA: 5.5 (ref 5.0–7.5)

## 2022-05-12 LAB — MICROSCOPIC EXAMINATION
Bacteria, UA: NONE SEEN
Casts: NONE SEEN /lpf
Epithelial Cells (non renal): NONE SEEN /hpf (ref 0–10)
RBC, Urine: NONE SEEN /hpf (ref 0–2)

## 2022-05-12 LAB — B12 AND FOLATE PANEL
Folate: >20 ng/mL (ref >3.0)
Vitamin B-12: 1119 pg/mL (ref 232–1245)

## 2022-05-12 NOTE — Assessment & Plan Note (Signed)
Known history of left hip osteoarthritis.  He is followed by Dr. Wynelle Link ([facility]) and will undergo THA on 11/29.  He is currently out of tramadol and states that his hip pain is getting worse. -I recommended that Mr. Berkey increase Tylenol frequency to every 6 hours.  He is currently taking 500 mg at a time. -Tramadol refilled today.  He will follow-up with orthopedic surgery later this month.

## 2022-05-12 NOTE — Assessment & Plan Note (Signed)
His wife reports that he has been urinating more frequently as of late.  She is concerned that he is B12 deficient because her sister was previously B12 deficient. -UA ordered today -B12 level ordered at the patient's request

## 2022-05-12 NOTE — Assessment & Plan Note (Signed)
He endorses allergy/asthma symptoms today that are periodically treated with a brief prednisone taper by his pulmonologist.  He requests a refill of prednisone today.

## 2022-05-15 DIAGNOSIS — Z23 Encounter for immunization: Secondary | ICD-10-CM | POA: Diagnosis not present

## 2022-05-18 NOTE — H&P (Signed)
TOTAL HIP ADMISSION H&P  Patient is admitted for left total hip arthroplasty.  Subjective:  Chief Complaint: Left hip pain  HPI: Antonio Carlson, 76 y.o. male, has a history of pain and functional disability in the left hip due to arthritis and patient has failed non-surgical conservative treatments for greater than 12 weeks to include NSAID's and/or analgesics, use of assistive devices, and activity modification. Onset of symptoms was gradual, starting  several  years ago with gradually worsening course since that time. The patient noted no past surgery on the left hip. Patient currently rates pain in the left hip at 9 out of 10 with activity. Patient has night pain, worsening of pain with activity and weight bearing, pain that interfers with activities of daily living, and pain with passive range of motion. Patient has evidence of  end-stage arthritis of the left hip, bone-on-bone, with significant subchondral cyst and a massive inferior osteophyte  by imaging studies. This condition presents safety issues increasing the risk of falls. There is no current active infection.  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   Angina pectoris syndrome ([place]) 12/15/2018   Nasal polyposis 03/17/2016   Cervical neck pain with evidence of disc disease = C8 nerve root distribution 06/11/2014   Mechanical low back pain 02/01/2014   Hip arthritis 02/05/2012   Essential hypertension 09/15/2010   OSTEOPENIA 10/10/2007   COLONIC POLYPS 07/08/2007   Hyperlipidemia  07/08/2007   Allergic rhinitis 07/08/2007   [place] asthma 07/08/2007   Diverticulosis of colon 07/08/2007   NEPHROLITHIASIS 07/08/2007   BENIGN PROSTATIC HYPERTROPHY, HX OF 07/08/2007    Past Medical History:  Diagnosis Date   Allergy    Anginal pain ([place])    Arthritis    Asthma    Benign prostatic hypertrophy    Coronary artery disease    Diverticulosis    Dyspnea    GERD (gastroesophageal reflux disease)    History of kidney stones    Hyperlipidemia    Hypertension    Nephrolithiasis    Pleural effusion on right    PVC's (premature ventricular contractions)    RECTAL BLEEDING 09/15/2010   Qualifier: Diagnosis of  By: Shane Crutch, Amy S    Syncope     Past Surgical History:  Procedure Laterality Date   APPENDECTOMY     CORONARY ARTERY BYPASS GRAFT N/A 05/01/2019   Procedure: CORONARY ARTERY BYPASS GRAFTING (CABG) TIMES FOUR USING LEFT AND RIGHT INTERNAL MAMMARY ARTERIES AND RIGHT RADIAL ARTERY WITH STERNAL PLATING;  Surgeon: [name], MD;  Location: [facility];  Service: Open Heart Surgery;  Laterality: N/A;   LEFT HEART CATH AND CORONARY ANGIOGRAPHY N/A 04/18/2019   Procedure: LEFT HEART CATH AND CORONARY ANGIOGRAPHY;  Surgeon: Belva Crome, MD;  Location: [facility];  Service: Cardiovascular;  Laterality: N/A;   NASAL SINUS SURGERY  Sept 2012   RADIAL ARTERY HARVEST Right 05/01/2019   Procedure: Right Radial Artery Harvest;  Surgeon: [name], MD;  Location: [facility];  Service: Open Heart Surgery;  Laterality: Right;   TEE  WITHOUT CARDIOVERSION N/A 05/01/2019   Procedure: TRANSESOPHAGEAL ECHOCARDIOGRAM (TEE);  Surgeon: [name], MD;  Location: [facility];  Service: Open Heart Surgery;  Laterality: N/A;    Prior to Admission medications   Medication Sig Start Date End Date Taking? Authorizing Provider  acetaminophen (TYLENOL) 500 MG tablet Take 500-1,000 mg by mouth every 6 (six) hours as needed for moderate pain or headache.    [provider]  atorvastatin (LIPITOR) 80 MG tablet TAKE 1 TABLET (80 MG TOTAL) BY MOUTH DAILY AT 6 PM 03/20/22   Hilty, Nadean Corwin, MD  budesonide (PULMICORT) 0.25 MG/2ML nebulizer solution USE 1 VIAL IN NEBULIZER TWICE DAILY 01/23/22   [name], MD  budesonide-formoterol (SYMBICORT) 160-4.5 MCG/ACT inhaler Inhale 2 puffs into the lungs 2 (two) times daily. 02/27/22   [name], MD  CALCIUM CITRATE-VITAMIN D PO Chew 1 tablet by mouth daily.    [provider]  clopidogrel (PLAVIX) 75 MG tablet TAKE 1 TABLET BY MOUTH EVERY DAY Patient taking differently: Take 75 mg by mouth daily. 05/22/21   Hilty, Nadean Corwin, MD  ezetimibe (ZETIA) 10 MG tablet Take 1 tablet (10 mg total) by mouth daily. 04/01/22   Lindell Spar, MD  metoprolol tartrate (LOPRESSOR) 50 MG tablet Take 0.5 tablets (25 mg total) by mouth 2 (two) times daily. 03/26/22   Lindell Spar, MD  montelukast (SINGULAIR) 10 MG tablet Take 1 tablet (10 mg total) by mouth at bedtime. 03/26/22   Lindell Spar, MD  Multiple Vitamins-Minerals (CENTRUM) tablet Take 1 tablet by mouth daily.    [provider]  pantoprazole (PROTONIX) 40 MG tablet TAKE 1 TABLET BY MOUTH EVERY DAY 03/20/22   Hilty, Nadean Corwin, MD  telmisartan (MICARDIS) 80 MG tablet Take 1 tablet (80 mg total) by mouth daily. 05/29/21   [name], MD  traMADol (ULTRAM) 50 MG tablet Take 1 tablet (50 mg total) by mouth every 12 (twelve) hours as needed for up to 10 days for severe pain. 05/11/22 05/21/22  Johnette Abraham, MD    Allergies  Allergen Reactions   Aspirin Shortness Of Breath   Amoxicillin     GI upset Did it involve swelling of the face/tongue/throat, SOB, or low BP? No Did it involve sudden or severe rash/hives, skin peeling, or any reaction on the inside of your mouth or nose? No Did you need to seek medical attention at a hospital or doctor's office? No When did it last happen?    20+ years   If all above answers are "NO", may proceed  with cephalosporin use.    Sulfonamide Derivatives Other (See Comments)    Doesn't remember    Social History   Socioeconomic History   Marital status: Married    Spouse name: Not on file   Number of children: Not on file   Years of education: Not on file   Highest education level: Not on file  Occupational History   Not on file  Tobacco Use   Smoking status: Former    Packs/day: 2.00    Years: 14.00    Total pack years: 28.00    Types: Cigarettes    Quit date: 07/27/1969    Years since quitting: 52.8   Smokeless tobacco: Never  Vaping Use   Vaping Use: Never used  Substance and Sexual Activity   Alcohol use: Yes    Comment: beer occ   Drug use: Never   Sexual activity: Yes  Other  Topics Concern   Not on file  Social History Narrative   Not on file   Social Determinants of Health   Financial Resource Strain: Low Risk  (01/07/2022)   Overall Financial Resource Strain (CARDIA)    Difficulty of Paying Living Expenses: Not hard at all  Food Insecurity: No Food Insecurity (01/07/2022)   Hunger Vital Sign    Worried About Running Out of Food in the Last Year: Never true    Ran Out of Food in the Last Year: Never true  Transportation Needs: No Transportation Needs (01/07/2022)   PRAPARE - Hydrologist (Medical): No    Lack of Transportation (Non-Medical): No  Physical Activity: Sufficiently Active (01/07/2022)   Exercise Vital Sign    Days of Exercise per Week: 7 days    Minutes of Exercise per Session: 30 min  Stress: No Stress Concern Present (01/07/2022)   [place]    Feeling of Stress : Not at all  Social Connections: Moderately Integrated (01/07/2022)   Social Connection and Isolation Panel [NHANES]    Frequency of Communication with Friends and Family: More than three times a week    Frequency of Social Gatherings with Friends and Family: More than three times a week     Attends Religious Services: More than 4 times per year    Active Member of [employer] or Organizations: No    Attends Archivist Meetings: Never    Marital Status: Married  Human resources officer Violence: Not At Risk (01/07/2022)   Humiliation, Afraid, Rape, and Kick questionnaire    Fear of Current or Ex-Partner: No    Emotionally Abused: No    Physically Abused: No    Sexually Abused: No    Tobacco Use: Medium Risk (05/11/2022)   Patient History    Smoking Tobacco Use: Former    Smokeless Tobacco Use: Never    Passive Exposure: Not on file   Social History   Substance and Sexual Activity  Alcohol Use Yes   Comment: beer occ    Family History  Problem Relation Age of Onset   Heart attack Father     Review of Systems  Constitutional:  Negative for chills and fever.  HENT:  Negative for congestion, sore throat and tinnitus.   Eyes:  Negative for double vision, photophobia and pain.  Respiratory:  Negative for cough, shortness of breath and wheezing.   Cardiovascular:  Negative for chest pain, palpitations and orthopnea.  Gastrointestinal:  Negative for heartburn, nausea and vomiting.  Genitourinary:  Negative for dysuria, frequency and urgency.  Musculoskeletal:  Positive for joint pain.  Neurological:  Negative for dizziness, weakness and headaches.     Objective:  Physical Exam: Well nourished and well developed.  General: Alert and oriented x3, cooperative and pleasant, no acute distress.  Head: normocephalic, atraumatic, neck supple.  Eyes: EOMI.  Musculoskeletal:  Left Hip Exam:  The range of motion: Flexion to 90 degrees, Internal Rotation is minimal, External Rotation to 30 degrees, and abduction to 30 to 40 degrees without discomfort.  There is no tenderness over the greater trochanteric bursa.   Calves soft and nontender. Motor function intact in LE. Strength 5/5 LE bilaterally. Neuro: Distal pulses 2+. Sensation to light touch intact in LE.  Imaging  Review Plain radiographs demonstrate severe degenerative joint disease of the left hip. The bone quality appears to be adequate for age and reported activity level.  Assessment/Plan:  End stage arthritis, left hip  The patient history, physical examination, clinical judgement of the provider and imaging studies are consistent with end stage degenerative joint disease of the left hip and total hip arthroplasty is deemed medically necessary. The treatment options including medical management, injection therapy, arthroscopy and arthroplasty were discussed at length. The risks and benefits of total hip arthroplasty were presented and reviewed. The risks due to aseptic loosening, infection, stiffness, dislocation/subluxation, thromboembolic complications and other imponderables were discussed. The patient acknowledged the explanation, agreed to proceed with the plan and consent was signed. Patient is being admitted for inpatient treatment for surgery, pain control, PT, OT, prophylactic antibiotics, VTE prophylaxis, progressive ambulation and ADLs and discharge planning.The patient is planning to be discharged  home .   Patient's anticipated LOS is less than 2 midnights, meeting these requirements: - Lives within 1 hour of care - Has a competent adult at home to recover with post-op recover - NO history of  - Chronic pain requiring opioids  - Diabetes  - Heart failure  - Stroke  - DVT/VTE  - Cardiac arrhythmia  - Respiratory Failure/COPD  - Renal failure  - Anemia  - Advanced Liver disease  Therapy Plans: HEP  Disposition: Home with wife Planned DVT Prophylaxis: Xarelto 10 mg QD  DME Needed: None PCP: Ihor Dow, MD (clearance received) Cardiologist: Lyman Bishop, MD (clearance received)  TXA: IV Allergies: ASA (anaphylaxis), augmentin, sulfa Anesthesia Concerns: None BMI: 25.5 Last HgbA1c: 5.8% (not diabetic) [facility]  Other: - Stopping Plavix 5 days prior -  Cannot tolerate ASA - Xarelto for 3 weeks postop - PMH: CAD with CABG x4, asthma  - Patient was instructed on what medications to stop prior to surgery. - Follow-up visit in 2 weeks with Dr. Wynelle Link - Begin physical therapy following surgery - Pre-operative lab work as pre-surgical testing - Prescriptions will be provided in hospital at time of discharge  Theresa Duty, PA-C Orthopedic Surgery [facility] [place]

## 2022-05-19 NOTE — Patient Instructions (Signed)
SURGICAL WAITING ROOM VISITATION Patients having surgery or a procedure may have no more than 2 support people in the waiting area - these visitors may rotate in the visitor waiting room.   Children under the age of 109 must have an adult with them who is not the patient. If the patient needs to stay at the hospital during part of their recovery, the visitor guidelines for inpatient rooms apply.  PRE-OP VISITATION  Pre-op nurse will coordinate an appropriate time for 1 support person to accompany the patient in pre-op.  This support person may not rotate.  This visitor will be contacted when the time is appropriate for the visitor to come back in the pre-op area.  Please refer to the [facility] website for the visitor guidelines for Inpatients (after your surgery is over and you are in a regular room).  You are not required to quarantine at this time prior to your surgery. However, you must do this: Hand Hygiene often Do NOT share personal items Notify your provider if you are in close contact with someone who has COVID or you develop fever 100.4 or greater, new onset of sneezing, cough, sore throat, shortness of breath or body aches.  If you test positive for Covid or have been in contact with anyone that has tested positive in the last 10 days please notify you surgeon.    Your procedure is scheduled on:    Report to [facility] Main Entrance: [address] entrance where the [employer] is available.   Report to admitting at:     AM  +++++Call this number if you have any questions or problems the morning of surgery [phone]  Do not eat food after Midnight the night prior to your surgery/procedure.  After Midnight you may have the following liquids until         AM / PM DAY OF SURGERY  Clear Liquid Diet Water Black Coffee (sugar ok, NO MILK/CREAM OR CREAMERS)  Tea (sugar ok, NO MILK/CREAM OR CREAMERS) regular and decaf                             Plain Jell-O  with no  fruit (NO RED)                                           Fruit ices (not with fruit pulp, NO RED)                                     Popsicles (NO RED)                                                                  Juice: apple, WHITE grape, WHITE cranberry Sports drinks like Gatorade or Powerade (NO RED)                   The day of surgery:  Drink ONE (1) Pre-Surgery Clear Ensure or G2 at        AM the morning of surgery. Drink in  one sitting. Do not sip.  This drink was given to you during your hospital pre-op appointment visit. Nothing else to drink after completing the Pre-Surgery Clear Ensure or G2 : No candy, chewing gum or throat lozenges.    FOLLOW BOWEL PREP AND ANY ADDITIONAL PRE OP INSTRUCTIONS YOU RECEIVED FROM YOUR SURGEON'S OFFICE!!!   Oral Hygiene is also important to reduce your risk of infection.        Remember - BRUSH YOUR TEETH THE MORNING OF SURGERY WITH YOUR REGULAR TOOTHPASTE  Do NOT smoke after Midnight the night before surgery.  Take ONLY these medicines the morning of surgery with A SIP OF WATER:    If You have been diagnosed with Sleep Apnea - Bring CPAP mask and tubing day of surgery. We will provide you with a CPAP machine on the day of your surgery.                   You may not have any metal on your body including hair pins, jewelry, and body piercing  Do not wear make-up, lotions, powders, perfumes / cologne, or deodorant  Do not wear nail polish including gel and S&S, artificial / acrylic nails, or any other type of covering on natural nails including finger and toenails. If you have artificial nails, gel coating, etc., that needs to be removed by a nail salon, Please have this removed prior to surgery. Not doing so may mean that your surgery could be cancelled or delayed if the Surgeon or anesthesia staff feels like they are unable to monitor you safely.   Do not shave 48 hours prior to surgery to avoid nicks in your skin which may contribute to  postoperative infections.   Men may shave face and neck.  Contacts, Hearing Aids, dentures or bridgework may not be worn into surgery.   You may bring a small overnight bag with you on the day of surgery, only pack items that are not valuable .[facility] IS NOT RESPONSIBLE   FOR VALUABLES THAT ARE LOST OR STOLEN.   Patients discharged on the day of surgery will not be allowed to drive home.  Someone NEEDS to stay with you for the first 24 hours after anesthesia.  Do not bring your home medications to the hospital. The Pharmacy will dispense medications listed on your medication list to you during your admission in the Hospital.  Special Instructions: Bring a copy of your healthcare power of attorney and living will documents the day of surgery, if you wish to have them scanned into your [facility] Medical Records- EPIC  Please read over the following fact sheets you were given: IF YOU HAVE QUESTIONS ABOUT YOUR PRE-OP INSTRUCTIONS, PLEASE CALL [phone]  ([place])   [facility] - Preparing for Surgery Before surgery, you can play an important role.  Because skin is not sterile, your skin needs to be as free of germs as possible.  You can reduce the number of germs on your skin by washing with CHG (chlorahexidine gluconate) soap before surgery.  CHG is an antiseptic cleaner which kills germs and bonds with the skin to continue killing germs even after washing. Please DO NOT use if you have an allergy to CHG or antibacterial soaps.  If your skin becomes reddened/irritated stop using the CHG and inform your nurse when you arrive at Short Stay. Do not shave (including legs and underarms) for at least 48 hours prior to the first CHG shower.  You may shave your face/neck.  Please follow these instructions carefully:  1.  Shower with CHG Soap the night before surgery and the  morning of surgery.  2.  If you choose to wash your hair, wash your hair first as usual with your normal  shampoo.  3.  After  you shampoo, rinse your hair and body thoroughly to remove the shampoo.                             4.  Use CHG as you would any other liquid soap.  You can apply chg directly to the skin and wash.  Gently with a scrungie or clean washcloth.  5.  Apply the CHG Soap to your body ONLY FROM THE NECK DOWN.   Do not use on face/ open                           Wound or open sores. Avoid contact with eyes, ears mouth and genitals (private parts).                       Wash face,  Genitals (private parts) with your normal soap.             6.  Wash thoroughly, paying special attention to the area where your  surgery  will be performed.  7.  Thoroughly rinse your body with warm water from the neck down.  8.  DO NOT shower/wash with your normal soap after using and rinsing off the CHG Soap.            9.  Pat yourself dry with a clean towel.            10.  Wear clean pajamas.            11.  Place clean sheets on your bed the night of your first shower and do not  sleep with pets.  ON THE DAY OF SURGERY : Do not apply any lotions/deodorants the morning of surgery.  Please wear clean clothes to the hospital/surgery center.    FAILURE TO FOLLOW THESE INSTRUCTIONS MAY RESULT IN THE CANCELLATION OF YOUR SURGERY  PATIENT SIGNATURE_________________________________  NURSE SIGNATURE__________________________________  ________________________________________________________________________         Adam Phenix    An incentive spirometer is a tool that can help keep your lungs clear and active. This tool measures how well you are filling your lungs with each breath. Taking long deep breaths may help reverse or decrease the chance of developing breathing (pulmonary) problems (especially infection) following: A long period of time when you are unable to move or be active. BEFORE THE PROCEDURE  If the spirometer includes an indicator to show your best effort, your nurse or respiratory  therapist will set it to a desired goal. If possible, sit up straight or lean slightly forward. Try not to slouch. Hold the incentive spirometer in an upright position. INSTRUCTIONS FOR USE  Sit on the edge of your bed if possible, or sit up as far as you can in bed or on a chair. Hold the incentive spirometer in an upright position. Breathe out normally. Place the mouthpiece in your mouth and seal your lips tightly around it. Breathe in slowly and as deeply as possible, raising the piston or the ball toward the top of the column. Hold your breath for 3-5 seconds or for as long as possible. Allow the  piston or ball to fall to the bottom of the column. Remove the mouthpiece from your mouth and breathe out normally. Rest for a few seconds and repeat Steps 1 through 7 at least 10 times every 1-2 hours when you are awake. Take your time and take a few normal breaths between deep breaths. The spirometer may include an indicator to show your best effort. Use the indicator as a goal to work toward during each repetition. After each set of 10 deep breaths, practice coughing to be sure your lungs are clear. If you have an incision (the cut made at the time of surgery), support your incision when coughing by placing a pillow or rolled up towels firmly against it. Once you are able to get out of bed, walk around indoors and cough well. You may stop using the incentive spirometer when instructed by your caregiver.  RISKS AND COMPLICATIONS Take your time so you do not get dizzy or light-headed. If you are in pain, you may need to take or ask for pain medication before doing incentive spirometry. It is harder to take a deep breath if you are having pain. AFTER USE Rest and breathe slowly and easily. It can be helpful to keep track of a log of your progress. Your caregiver can provide you with a simple table to help with this. If you are using the spirometer at home, follow these instructions: [place] IF:  You are having difficultly using the spirometer. You have trouble using the spirometer as often as instructed. Your pain medication is not giving enough relief while using the spirometer. You develop fever of 100.5 F (38.1 C) or higher.                                                                                                    SEEK IMMEDIATE MEDICAL CARE IF:  You cough up bloody sputum that had not been present before. You develop fever of 102 F (38.9 C) or greater. You develop worsening pain at or near the incision site. MAKE SURE YOU:  Understand these instructions. Will watch your condition. Will get help right away if you are not doing well or get worse. Document Released: 11/23/2006 Document Revised: 10/05/2011 Document Reviewed: 01/24/2007 [facility] Patient Information 2014 [place], [place].

## 2022-05-19 NOTE — Patient Instructions (Addendum)
SURGICAL WAITING ROOM VISITATION Patients having surgery or a procedure may have no more than 2 support people in the waiting area - these visitors may rotate in the visitor waiting room.   Children under the age of 35 must have an adult with them who is not the patient. If the patient needs to stay at the hospital during part of their recovery, the visitor guidelines for inpatient rooms apply.  PRE-OP VISITATION  Pre-op nurse will coordinate an appropriate time for 1 support person to accompany the patient in pre-op.  This support person may not rotate.  This visitor will be contacted when the time is appropriate for the visitor to come back in the pre-op area.  Please refer to the [facility] website for the visitor guidelines for Inpatients (after your surgery is over and you are in a regular room).  You are not required to quarantine at this time prior to your surgery. However, you must do this: Hand Hygiene often Do NOT share personal items Notify your provider if you are in close contact with someone who has COVID or you develop fever 100.4 or greater, new onset of sneezing, cough, sore throat, shortness of breath or body aches.  If you test positive for Covid or have been in contact with anyone that has tested positive in the last 10 days please notify you surgeon.    Your procedure is scheduled on:  Wednesday  May 27, 2022   Report to [facility] Main Entrance: [address] entrance where the [employer] is available.   Report to admitting at:  12:30  PM  +++++Call this number if you have any questions or problems the morning of surgery [phone]  Do not eat food after Midnight the night prior to your surgery/procedure.  After Midnight you may have the following liquids until 12:00 noon the DAY OF SURGERY  Clear Liquid Diet Water Black Coffee (sugar ok, NO MILK/CREAM OR CREAMERS)  Tea (sugar ok, NO MILK/CREAM OR CREAMERS) regular and decaf                              Plain Jell-O  with no fruit (NO RED)                                           Fruit ices (not with fruit pulp, NO RED)                                     Popsicles (NO RED)                                                                  Juice: apple, WHITE grape, WHITE cranberry Sports drinks like Gatorade or Powerade (NO RED)                    The day of surgery:  Drink ONE (1) Pre-Surgery Clear Ensure at    12:00 noon the day of surgery Drink in one sitting. Do not sip.  This drink  was given to you during your hospital pre-op appointment visit. Nothing else to drink after completing the Pre-Surgery Clear Ensure : No candy, chewing gum or throat lozenges.    FOLLOW ANY ADDITIONAL PRE OP INSTRUCTIONS YOU RECEIVED FROM YOUR SURGEON'S OFFICE!!!   Oral Hygiene is also important to reduce your risk of infection.        Remember - BRUSH YOUR TEETH THE MORNING OF SURGERY WITH YOUR REGULAR TOOTHPASTE  Do NOT smoke after Midnight the night before surgery.  Take ONLY these medicines the morning of surgery with A SIP OF WATER: Metoptolol, Pantoprazole, and you may use your inhalers   If You have been diagnosed with Sleep Apnea - Bring CPAP mask and tubing day of surgery. We will provide you with a CPAP machine on the day of your surgery.                   You may not have any metal on your body including  jewelry, and body piercing  Do not wear lotions, powders,  cologne, or deodorant  Men may shave face and neck.  Contacts, Hearing Aids, dentures or bridgework may not be worn into surgery.   You may bring a small overnight bag with you on the day of surgery, only pack items that are not valuable .[facility] IS NOT RESPONSIBLE   FOR VALUABLES THAT ARE LOST OR STOLEN.   Do not bring your home medications to the hospital. The Pharmacy will dispense medications listed on your medication list to you during your admission in the Hospital.  Special Instructions: Bring a copy of your  healthcare power of attorney and living will documents the day of surgery, if you wish to have them scanned into your [facility] Records- EPIC  Please read over the following fact sheets you were given: IF YOU HAVE QUESTIONS ABOUT YOUR PRE-OP INSTRUCTIONS, PLEASE CALL [phone]  ([place])   [place] - Preparing for Surgery Before surgery, you can play an important role.  Because skin is not sterile, your skin needs to be as free of germs as possible.  You can reduce the number of germs on your skin by washing with CHG (chlorahexidine gluconate) soap before surgery.  CHG is an antiseptic cleaner which kills germs and bonds with the skin to continue killing germs even after washing. Please DO NOT use if you have an allergy to CHG or antibacterial soaps.  If your skin becomes reddened/irritated stop using the CHG and inform your nurse when you arrive at Short Stay. Do not shave (including legs and underarms) for at least 48 hours prior to the first CHG shower.  You may shave your face/neck.  Please follow these instructions carefully:  1.  Shower with CHG Soap the night before surgery and the  morning of surgery.  2.  If you choose to wash your hair, wash your hair first as usual with your normal  shampoo.  3.  After you shampoo, rinse your hair and body thoroughly to remove the shampoo.                             4.  Use CHG as you would any other liquid soap.  You can apply chg directly to the skin and wash.  Gently with a scrungie or clean washcloth.  5.  Apply the CHG Soap to your body ONLY FROM THE NECK DOWN.   Do not use on face/  open                           Wound or open sores. Avoid contact with eyes, ears mouth and genitals (private parts).                       Wash face,  Genitals (private parts) with your normal soap.             6.  Wash thoroughly, paying special attention to the area where your  surgery  will be performed.  7.  Thoroughly rinse your body with warm water  from the neck down.  8.  DO NOT shower/wash with your normal soap after using and rinsing off the CHG Soap.            9.  Pat yourself dry with a clean towel.            10.  Wear clean pajamas.            11.  Place clean sheets on your bed the night of your first shower and do not  sleep with pets.  ON THE DAY OF SURGERY : Do not apply any lotions/deodorants the morning of surgery.  Please wear clean clothes to the hospital/surgery center.    FAILURE TO FOLLOW THESE INSTRUCTIONS MAY RESULT IN THE CANCELLATION OF YOUR SURGERY  PATIENT SIGNATURE_________________________________  NURSE SIGNATURE__________________________________  ________________________________________________________________________            Adam Phenix    An incentive spirometer is a tool that can help keep your lungs clear and active. This tool measures how well you are filling your lungs with each breath. Taking long deep breaths may help reverse or decrease the chance of developing breathing (pulmonary) problems (especially infection) following: A long period of time when you are unable to move or be active. BEFORE THE PROCEDURE  If the spirometer includes an indicator to show your best effort, your nurse or respiratory therapist will set it to a desired goal. If possible, sit up straight or lean slightly forward. Try not to slouch. Hold the incentive spirometer in an upright position. INSTRUCTIONS FOR USE  Sit on the edge of your bed if possible, or sit up as far as you can in bed or on a chair. Hold the incentive spirometer in an upright position. Breathe out normally. Place the mouthpiece in your mouth and seal your lips tightly around it. Breathe in slowly and as deeply as possible, raising the piston or the ball toward the top of the column. Hold your breath for 3-5 seconds or for as long as possible. Allow the piston or ball to fall to the bottom of the column. Remove the mouthpiece  from your mouth and breathe out normally. Rest for a few seconds and repeat Steps 1 through 7 at least 10 times every 1-2 hours when you are awake. Take your time and take a few normal breaths between deep breaths. The spirometer may include an indicator to show your best effort. Use the indicator as a goal to work toward during each repetition. After each set of 10 deep breaths, practice coughing to be sure your lungs are clear. If you have an incision (the cut made at the time of surgery), support your incision when coughing by placing a pillow or rolled up towels firmly against it. Once you are able to get out of bed, walk around indoors and  cough well. You may stop using the incentive spirometer when instructed by your caregiver.  RISKS AND COMPLICATIONS Take your time so you do not get dizzy or light-headed. If you are in pain, you may need to take or ask for pain medication before doing incentive spirometry. It is harder to take a deep breath if you are having pain. AFTER USE Rest and breathe slowly and easily. It can be helpful to keep track of a log of your progress. Your caregiver can provide you with a simple table to help with this. If you are using the spirometer at home, follow these instructions: [place] IF:  You are having difficultly using the spirometer. You have trouble using the spirometer as often as instructed. Your pain medication is not giving enough relief while using the spirometer. You develop fever of 100.5 F (38.1 C) or higher.                                                                                                    SEEK IMMEDIATE MEDICAL CARE IF:  You cough up bloody sputum that had not been present before. You develop fever of 102 F (38.9 C) or greater. You develop worsening pain at or near the incision site. MAKE SURE YOU:  Understand these instructions. Will watch your condition. Will get help right away if you are not doing well or get  worse. Document Released: 11/23/2006 Document Revised: 10/05/2011 Document Reviewed: 01/24/2007 [facility] Patient Information 2014 [place], [place].

## 2022-05-19 NOTE — Progress Notes (Addendum)
COVID Vaccine received:  '[]'$  No '[x]'$  Yes Date of any COVID positive Test in last 90 days:  PCP - Ihor Dow, MD at [facility].    Clearance note 04-01-22  Epic Cardiologist - Pixie Casino, MD   Cardiac Clearance- Coletta Memos, NP  note 03-24-2022 Pulmonology - Christinia Gully, MD   Clearance telephone note 03-13-2022  Chest x-ray - 09-24-21 Epic EKG -  03-24-22  Epic Stress Test - n/a ECHO - 04-2019  Epic Cardiac Cath - 03-2019  LHC by Dr. Linard Millers CABG - x 4   04-2019 by Dr. Mardene Sayer  Pacemaker/ICD device     '[]'$  N/A Spinal Cord Stimulator:'[]'$  No '[]'$  Yes      (Remind patient to bring remote DOS) Other Implants:   History of Sleep Apnea? '[]'$  No '[]'$  Yes   Sleep Study Date:   CPAP used?- '[]'$  No '[]'$  Yes  (Instruct to bring their mask & Tubing)  Does the patient monitor blood sugar? '[]'$  No '[]'$  Yes  '[]'$  N/A Does patient have a [employer] or Dexacom? '[]'$  No '[]'$  Yes   Fasting Blood Sugar Ranges-  Checks Blood Sugar _____ times a day  Blood Thinner Instructions: Aspirin Instructions: Last Dose:  ERAS Protocol Ordered: '[]'$  No  '[x]'$  Yes PRE-SURGERY '[x]'$  ENSURE  '[]'$  G2   Comments:   Activity level: Patient can / can not climb a flight of stairs without difficulty;  '[]'$  No CP  '[]'$  No SOB,  but would have ______   Patient can / can not perform ADLs without assistance.   Anesthesia review: CABG x4 (04-2019), LHC (03-2019) by Dr. Linard Millers, Asthma, HTN  Patient denies shortness of breath, fever, cough and chest pain at PAT appointment.  Patient verbalized understanding and agreement to the Pre-Surgical Instructions that were given to them at this PAT appointment. Patient was also educated of the need to review these PAT instructions again prior to his/her surgery.I reviewed the appropriate phone numbers to call if they have any and questions or concerns.

## 2022-05-20 ENCOUNTER — Encounter (HOSPITAL_COMMUNITY): Payer: Self-pay

## 2022-05-20 ENCOUNTER — Encounter (HOSPITAL_COMMUNITY)
Admission: RE | Admit: 2022-05-20 | Discharge: 2022-05-20 | Disposition: A | Discharge status: Home / Self Care | Payer: Medicare Other | Source: Ambulatory Visit | Attending: Orthopedic Surgery | Admitting: Orthopedic Surgery

## 2022-05-20 ENCOUNTER — Other Ambulatory Visit: Payer: Self-pay

## 2022-05-20 VITALS — BP 139/75 | HR 73 | Temp 97.9°F | Resp 20 | Ht 68.0 in | Wt 172.0 lb

## 2022-05-20 DIAGNOSIS — Z01818 Encounter for other preprocedural examination: Secondary | ICD-10-CM

## 2022-05-20 DIAGNOSIS — Z01812 Encounter for preprocedural laboratory examination: Secondary | ICD-10-CM | POA: Insufficient documentation

## 2022-05-20 DIAGNOSIS — I10 Essential (primary) hypertension: Secondary | ICD-10-CM | POA: Diagnosis not present

## 2022-05-20 HISTORY — DX: Zoster without complications: B02.9

## 2022-05-20 HISTORY — DX: Pneumonia, unspecified organism: J18.9

## 2022-05-20 LAB — CBC
HCT: 45 % (ref 39.0–52.0)
Hemoglobin: 14.6 g/dL (ref 13.0–17.0)
MCH: 28.9 pg (ref 26.0–34.0)
MCHC: 32.4 g/dL (ref 30.0–36.0)
MCV: 89.1 fL (ref 80.0–100.0)
Platelets: 350 10*3/uL (ref 150–400)
RBC: 5.05 MIL/uL (ref 4.22–5.81)
RDW: 15.4 % (ref 11.5–15.5)
WBC: 9.8 10*3/uL (ref 4.0–10.5)
nRBC: 0 % (ref 0.0–0.2)

## 2022-05-20 LAB — BASIC METABOLIC PANEL
Anion gap: 8 (ref 5–15)
BUN: 20 mg/dL (ref 8–23)
CO2: 29 mmol/L (ref 22–32)
Calcium: 9.2 mg/dL (ref 8.9–10.3)
Chloride: 102 mmol/L (ref 98–111)
Creatinine, Ser: 0.84 mg/dL (ref 0.61–1.24)
GFR, Estimated: >60 mL/min (ref >60)
Glucose, Bld: 81 mg/dL (ref 70–99)
Potassium: 4.9 mmol/L (ref 3.5–5.1)
Sodium: 139 mmol/L (ref 135–145)

## 2022-05-20 LAB — SURGICAL PCR SCREEN
MRSA, PCR: NEGATIVE
Staphylococcus aureus: NEGATIVE

## 2022-05-21 DIAGNOSIS — H52203 Unspecified astigmatism, bilateral: Secondary | ICD-10-CM | POA: Diagnosis not present

## 2022-05-21 DIAGNOSIS — H26492 Other secondary cataract, left eye: Secondary | ICD-10-CM | POA: Diagnosis not present

## 2022-05-21 DIAGNOSIS — H353132 Nonexudative age-related macular degeneration, bilateral, intermediate dry stage: Secondary | ICD-10-CM | POA: Diagnosis not present

## 2022-05-21 DIAGNOSIS — Z961 Presence of intraocular lens: Secondary | ICD-10-CM | POA: Diagnosis not present

## 2022-05-21 NOTE — Progress Notes (Signed)
Anesthesia Chart Review   Case: [id number] Date/Time: 05/27/22 1455   Procedure: TOTAL HIP ARTHROPLASTY ANTERIOR APPROACH (Left: Hip)   Anesthesia type: Choice   Pre-op diagnosis: left hip osteoarthritis   Location: [facility] ROOM 10 / [facility]   Surgeons: Gaynelle Arabian, MD       DISCUSSION:76 y.o. former smoker with h/o CAD (CABG 2020), PVCs, left hip OA scheduled for above procedure 05/27/2022 with Dr. Gaynelle Arabian.   Pt seen by PCP 04/01/2022.  Per PCP pt is medically optimized for THA.   Pt last seen by cardiology 03/24/2022. Per OV note, "Chart reviewed as part of pre-operative protocol coverage. Given past medical history and time since last visit, based on ACC/AHA guidelines, Antonio Carlson would be at acceptable risk for the planned procedure without further cardiovascular testing.    Patient was advised that if he develops new symptoms prior to surgery to contact our office to arrange a follow-up appointment.  He verbalized understanding.   He is able to complete greater than 4 METS of physical activity.  His RCRI is a class II risk, 0.9% risk of major cardiac event."  He was advised to hold Plavix for 5 days prior to procedure.   Anticipate pt can proceed with planned procedure barring acute status change.   VS: BP 139/75 Comment: right arm sitting  Pulse 73   Temp 36.6 C (Oral)   Resp 20   Ht '5\' 8"'$  (1.727 m)   Wt 78 kg   SpO2 98%   BMI 26.15 kg/m   PROVIDERS: Lindell Spar, MD is PCP   Cardiologist - Pixie Casino, MD  Pulmonology - Christinia Gully, MD LABS: Labs reviewed: Acceptable for surgery. (all labs ordered are listed, but only abnormal results are displayed)  Labs Reviewed  SURGICAL PCR SCREEN  BASIC METABOLIC PANEL  CBC  TYPE AND SCREEN     IMAGES:   EKG:   CV: Echo 04/25/2019  1. Left ventricular ejection fraction, by visual estimation, is 60 to  65%. The left ventricle has normal function. Normal left ventricular size.  There is no left  ventricular hypertrophy.   2. Global right ventricle has normal systolic function.The right  ventricular size is mildly enlarged. No increase in right ventricular wall  thickness.   3. Left atrial size was normal.   4. Right atrial size was normal.   5. Mild mitral annular calcification.   6. The mitral valve is normal in structure. No evidence of mitral valve  regurgitation. No evidence of mitral stenosis.   7. The tricuspid valve is normal in structure. Tricuspid valve  regurgitation was not visualized by color flow Doppler.   8. The aortic valve is normal in structure. Aortic valve regurgitation is  trivial by color flow Doppler. Structurally normal aortic valve, with no  evidence of sclerosis or stenosis.   9. The pulmonic valve was normal in structure. Pulmonic valve  regurgitation is not visualized by color flow Doppler.  10. The inferior vena cava is normal in size with greater than 50%  respiratory variability, suggesting right atrial pressure of 3 mmHg.  Past Medical History:  Diagnosis Date   Allergy    Anginal pain ([place])    Arthritis    Asthma    Benign prostatic hypertrophy    Coronary artery disease    Diverticulosis    Dyspnea    GERD (gastroesophageal reflux disease)    History of kidney stones    Hyperlipidemia    Hypertension  Nephrolithiasis    Pleural effusion on right    Pneumonia    PVC's (premature ventricular contractions)    RECTAL BLEEDING 09/15/2010   Qualifier: Diagnosis of  By: Trellis Paganini PA-c, Amy S    Shingles    twice   Syncope     Past Surgical History:  Procedure Laterality Date   APPENDECTOMY     CORONARY ARTERY BYPASS GRAFT N/A 05/01/2019   Procedure: CORONARY ARTERY BYPASS GRAFTING (CABG) TIMES FOUR USING LEFT AND RIGHT INTERNAL MAMMARY ARTERIES AND RIGHT RADIAL ARTERY WITH STERNAL PLATING;  Surgeon: [name], MD;  Location: [place];  Service: Open Heart Surgery;  Laterality: N/A;   EYE SURGERY Bilateral    catartact surgery    LEFT HEART CATH AND CORONARY ANGIOGRAPHY N/A 04/18/2019   Procedure: LEFT HEART CATH AND CORONARY ANGIOGRAPHY;  Surgeon: Belva Crome, MD;  Location: [place] [facility];  Service: Cardiovascular;  Laterality: N/A;   NASAL SINUS SURGERY  03/28/2011   RADIAL ARTERY HARVEST Right 05/01/2019   Procedure: Right Radial Artery Harvest;  Surgeon: [name], MD;  Location: [place];  Service: Open Heart Surgery;  Laterality: Right;   TEE WITHOUT CARDIOVERSION N/A 05/01/2019   Procedure: TRANSESOPHAGEAL ECHOCARDIOGRAM (TEE);  Surgeon: [name], MD;  Location: [place];  Service: Open Heart Surgery;  Laterality: N/A;    MEDICATIONS:  acetaminophen (TYLENOL) 500 MG tablet   atorvastatin (LIPITOR) 80 MG tablet   budesonide (PULMICORT) 0.25 MG/2ML nebulizer solution   budesonide-formoterol (SYMBICORT) 160-4.5 MCG/ACT inhaler   CALCIUM CITRATE-VITAMIN D PO   clopidogrel (PLAVIX) 75 MG tablet   ezetimibe (ZETIA) 10 MG tablet   formoterol (PERFOROMIST) 20 MCG/2ML nebulizer solution   loratadine (CLARITIN) 10 MG tablet   metoprolol tartrate (LOPRESSOR) 50 MG tablet   montelukast (SINGULAIR) 10 MG tablet   Multiple Vitamins-Minerals (CENTRUM) tablet   pantoprazole (PROTONIX) 40 MG tablet   telmisartan (MICARDIS) 80 MG tablet   traMADol (ULTRAM) 50 MG tablet   No current facility-administered medications for this encounter.   Konrad Felix Ward, PA-C [facility]-Surgical Testing [phone]

## 2022-05-21 NOTE — Anesthesia Preprocedure Evaluation (Addendum)
Anesthesia Evaluation  Patient identified by MRN, date of birth, ID band Patient awake    Reviewed: Allergy & Precautions, NPO status , Patient's Chart, lab work & pertinent test results, reviewed documented beta blocker date and time   History of Anesthesia Complications Negative for: history of anesthetic complications  Airway Mallampati: II  TM Distance: >3 FB Neck ROM: Full    Dental  (+) Dental Advisory Given, Teeth Intact   Pulmonary asthma , former smoker,    Pulmonary exam normal        Cardiovascular hypertension, Pt. on home beta blockers and Pt. on medications + CAD and + CABG  Normal cardiovascular exam     Neuro/Psych negative neurological ROS  negative psych ROS   GI/Hepatic Neg liver ROS, GERD  Medicated and Controlled,  Endo/Other  negative endocrine ROS  Renal/GU negative Renal ROS     Musculoskeletal  (+) Arthritis ,   Abdominal   Peds  Hematology  On plavix    Anesthesia Other Findings   Reproductive/Obstetrics                          Anesthesia Physical Anesthesia Plan  ASA: 3  Anesthesia Plan: Spinal   Post-op Pain Management: Tylenol PO (pre-op)*   Induction:   PONV Risk Score and Plan: 1 and Treatment may vary due to age or medical condition and Propofol infusion  Airway Management Planned: Natural Airway and Simple Face Mask  Additional Equipment: None  Intra-op Plan:   Post-operative Plan:   Informed Consent: I have reviewed the patients History and Physical, chart, labs and discussed the procedure including the risks, benefits and alternatives for the proposed anesthesia with the patient or authorized representative who has indicated his/her understanding and acceptance.       Plan Discussed with: CRNA and Anesthesiologist  Anesthesia Plan Comments: (Labs reviewed, platelets acceptable. Discussed risks and benefits of spinal, including  spinal/epidural hematoma, infection, failed block, and PDPH. Patient expressed understanding and wished to proceed. )      Anesthesia Quick Evaluation

## 2022-05-27 ENCOUNTER — Observation Stay (HOSPITAL_COMMUNITY)
Admission: RE | Admit: 2022-05-27 | Discharge: 2022-05-28 | Disposition: A | Discharge status: Home / Self Care | Payer: Medicare Other | Attending: Orthopedic Surgery | Admitting: Orthopedic Surgery

## 2022-05-27 ENCOUNTER — Other Ambulatory Visit: Payer: Self-pay

## 2022-05-27 ENCOUNTER — Encounter (HOSPITAL_COMMUNITY): Payer: Self-pay | Admitting: Orthopedic Surgery

## 2022-05-27 ENCOUNTER — Observation Stay (HOSPITAL_COMMUNITY): Payer: Medicare Other

## 2022-05-27 ENCOUNTER — Ambulatory Visit (HOSPITAL_COMMUNITY): Payer: Medicare Other

## 2022-05-27 ENCOUNTER — Ambulatory Visit (HOSPITAL_BASED_OUTPATIENT_CLINIC_OR_DEPARTMENT_OTHER): Payer: Medicare Other | Admitting: Anesthesiology

## 2022-05-27 ENCOUNTER — Ambulatory Visit (HOSPITAL_COMMUNITY): Payer: Medicare Other | Admitting: Physician Assistant

## 2022-05-27 ENCOUNTER — Encounter (HOSPITAL_COMMUNITY)
Admission: RE | Disposition: A | Discharge status: Home / Self Care | Payer: Self-pay | Source: Home / Self Care | Attending: Orthopedic Surgery

## 2022-05-27 DIAGNOSIS — I2510 Atherosclerotic heart disease of native coronary artery without angina pectoris: Secondary | ICD-10-CM

## 2022-05-27 DIAGNOSIS — M1612 Unilateral primary osteoarthritis, left hip: Secondary | ICD-10-CM

## 2022-05-27 DIAGNOSIS — Z79899 Other long term (current) drug therapy: Secondary | ICD-10-CM | POA: Insufficient documentation

## 2022-05-27 DIAGNOSIS — Z96642 Presence of left artificial hip joint: Secondary | ICD-10-CM | POA: Diagnosis not present

## 2022-05-27 DIAGNOSIS — Z951 Presence of aortocoronary bypass graft: Secondary | ICD-10-CM | POA: Insufficient documentation

## 2022-05-27 DIAGNOSIS — Z7902 Long term (current) use of antithrombotics/antiplatelets: Secondary | ICD-10-CM | POA: Diagnosis not present

## 2022-05-27 DIAGNOSIS — Z87891 Personal history of nicotine dependence: Secondary | ICD-10-CM | POA: Diagnosis not present

## 2022-05-27 DIAGNOSIS — Z471 Aftercare following joint replacement surgery: Secondary | ICD-10-CM | POA: Diagnosis not present

## 2022-05-27 DIAGNOSIS — Z01818 Encounter for other preprocedural examination: Secondary | ICD-10-CM

## 2022-05-27 DIAGNOSIS — J45909 Unspecified asthma, uncomplicated: Secondary | ICD-10-CM | POA: Insufficient documentation

## 2022-05-27 DIAGNOSIS — I10 Essential (primary) hypertension: Secondary | ICD-10-CM | POA: Insufficient documentation

## 2022-05-27 DIAGNOSIS — M169 Osteoarthritis of hip, unspecified: Secondary | ICD-10-CM | POA: Diagnosis present

## 2022-05-27 HISTORY — PX: TOTAL HIP ARTHROPLASTY: SHX124

## 2022-05-27 LAB — TYPE AND SCREEN
ABO/RH(D): O POS
Antibody Screen: NEGATIVE

## 2022-05-27 SURGERY — ARTHROPLASTY, HIP, TOTAL, ANTERIOR APPROACH
Anesthesia: Spinal | Site: Hip | Laterality: Left

## 2022-05-27 MED ORDER — OXYCODONE HCL 5 MG PO TABS: 5.0000 mg | ORAL_TABLET | Freq: Once | ORAL | Status: DC | PRN

## 2022-05-27 MED ORDER — EZETIMIBE 10 MG PO TABS
10.0000 mg | ORAL_TABLET | Freq: Every day | ORAL | Status: DC
  Administered 2022-05-28: 10 mg via ORAL
  Filled 2022-05-27: qty 1

## 2022-05-27 MED ORDER — DEXAMETHASONE SODIUM PHOSPHATE 10 MG/ML IJ SOLN
8.0000 mg | Freq: Once | INTRAMUSCULAR | Status: AC
  Administered 2022-05-27: 8 mg via INTRAVENOUS

## 2022-05-27 MED ORDER — METOCLOPRAMIDE HCL 5 MG PO TABS: 5.0000 mg | ORAL_TABLET | Freq: Three times a day (TID) | ORAL | Status: DC | PRN

## 2022-05-27 MED ORDER — PROPOFOL 500 MG/50ML IV EMUL
INTRAVENOUS | Status: DC | PRN
  Administered 2022-05-27: 30 ug/kg/min via INTRAVENOUS

## 2022-05-27 MED ORDER — METOPROLOL TARTRATE 25 MG PO TABS
25.0000 mg | ORAL_TABLET | Freq: Two times a day (BID) | ORAL | Status: DC
  Administered 2022-05-27 – 2022-05-28 (×2): 25 mg via ORAL
  Filled 2022-05-27 (×2): qty 1

## 2022-05-27 MED ORDER — BUPIVACAINE IN DEXTROSE 0.75-8.25 % IT SOLN
INTRATHECAL | Status: DC | PRN
  Administered 2022-05-27: 1.6 mL via INTRATHECAL

## 2022-05-27 MED ORDER — ONDANSETRON HCL 4 MG/2ML IJ SOLN
INTRAMUSCULAR | Status: AC
  Filled 2022-05-27: qty 2

## 2022-05-27 MED ORDER — ONDANSETRON HCL 4 MG/2ML IJ SOLN: 4.0000 mg | Freq: Four times a day (QID) | INTRAMUSCULAR | Status: DC | PRN

## 2022-05-27 MED ORDER — SODIUM CHLORIDE 0.9 % IV SOLN: INTRAVENOUS | Status: DC

## 2022-05-27 MED ORDER — MIDAZOLAM HCL 5 MG/5ML IJ SOLN
INTRAMUSCULAR | Status: DC | PRN
  Administered 2022-05-27: 1 mg via INTRAVENOUS

## 2022-05-27 MED ORDER — LIDOCAINE HCL (CARDIAC) PF 100 MG/5ML IV SOSY
PREFILLED_SYRINGE | INTRAVENOUS | Status: DC | PRN
  Administered 2022-05-27: 50 mg via INTRAVENOUS

## 2022-05-27 MED ORDER — CEFAZOLIN SODIUM-DEXTROSE 2-4 GM/100ML-% IV SOLN
2.0000 g | INTRAVENOUS | Status: AC
  Administered 2022-05-27: 2 g via INTRAVENOUS
  Filled 2022-05-27: qty 100

## 2022-05-27 MED ORDER — DEXAMETHASONE SODIUM PHOSPHATE 10 MG/ML IJ SOLN
INTRAMUSCULAR | Status: AC
  Filled 2022-05-27: qty 1

## 2022-05-27 MED ORDER — PANTOPRAZOLE SODIUM 40 MG PO TBEC
40.0000 mg | DELAYED_RELEASE_TABLET | Freq: Every day | ORAL | Status: DC
  Administered 2022-05-28: 40 mg via ORAL
  Filled 2022-05-27: qty 1

## 2022-05-27 MED ORDER — METOCLOPRAMIDE HCL 5 MG/ML IJ SOLN: 5.0000 mg | Freq: Three times a day (TID) | INTRAMUSCULAR | Status: DC | PRN

## 2022-05-27 MED ORDER — CEFAZOLIN SODIUM-DEXTROSE 2-4 GM/100ML-% IV SOLN
2.0000 g | Freq: Four times a day (QID) | INTRAVENOUS | Status: AC
  Administered 2022-05-27 – 2022-05-28 (×2): 2 g via INTRAVENOUS
  Filled 2022-05-27 (×2): qty 100

## 2022-05-27 MED ORDER — FENTANYL CITRATE PF 50 MCG/ML IJ SOSY: 25.0000 ug | PREFILLED_SYRINGE | INTRAMUSCULAR | Status: DC | PRN

## 2022-05-27 MED ORDER — ONDANSETRON HCL 4 MG PO TABS: 4.0000 mg | ORAL_TABLET | Freq: Four times a day (QID) | ORAL | Status: DC | PRN

## 2022-05-27 MED ORDER — MENTHOL 3 MG MT LOZG: 1.0000 | LOZENGE | OROMUCOSAL | Status: DC | PRN

## 2022-05-27 MED ORDER — IRBESARTAN 150 MG PO TABS
300.0000 mg | ORAL_TABLET | Freq: Every day | ORAL | Status: DC
  Administered 2022-05-28: 300 mg via ORAL
  Filled 2022-05-27: qty 2

## 2022-05-27 MED ORDER — MAGNESIUM CITRATE PO SOLN: 1.0000 | Freq: Once | ORAL | Status: DC | PRN

## 2022-05-27 MED ORDER — MIDAZOLAM HCL 2 MG/2ML IJ SOLN
INTRAMUSCULAR | Status: AC
  Filled 2022-05-27: qty 2

## 2022-05-27 MED ORDER — FENTANYL CITRATE (PF) 100 MCG/2ML IJ SOLN
INTRAMUSCULAR | Status: AC
  Filled 2022-05-27: qty 2

## 2022-05-27 MED ORDER — FENTANYL CITRATE (PF) 100 MCG/2ML IJ SOLN
INTRAMUSCULAR | Status: DC | PRN
  Administered 2022-05-27: 50 ug via INTRAVENOUS

## 2022-05-27 MED ORDER — BUDESONIDE 0.25 MG/2ML IN SUSP
0.2500 mg | Freq: Two times a day (BID) | RESPIRATORY_TRACT | Status: DC
  Administered 2022-05-27 – 2022-05-28 (×2): 0.25 mg via RESPIRATORY_TRACT
  Filled 2022-05-27 (×2): qty 2

## 2022-05-27 MED ORDER — ATORVASTATIN CALCIUM 40 MG PO TABS: 80.0000 mg | ORAL_TABLET | Freq: Every day | ORAL | Status: DC

## 2022-05-27 MED ORDER — 0.9 % SODIUM CHLORIDE (POUR BTL) OPTIME
TOPICAL | Status: DC | PRN
  Administered 2022-05-27: 1000 mL

## 2022-05-27 MED ORDER — ACETAMINOPHEN 325 MG PO TABS: 325.0000 mg | ORAL_TABLET | Freq: Four times a day (QID) | ORAL | Status: DC | PRN

## 2022-05-27 MED ORDER — ONDANSETRON HCL 4 MG/2ML IJ SOLN
INTRAMUSCULAR | Status: DC | PRN
  Administered 2022-05-27: 4 mg via INTRAVENOUS

## 2022-05-27 MED ORDER — LACTATED RINGERS IV SOLN: INTRAVENOUS | Status: DC

## 2022-05-27 MED ORDER — HYDROCODONE-ACETAMINOPHEN 7.5-325 MG PO TABS: 1.0000 | ORAL_TABLET | ORAL | Status: DC | PRN

## 2022-05-27 MED ORDER — BUPIVACAINE-EPINEPHRINE (PF) 0.5% -1:200000 IJ SOLN
INTRAMUSCULAR | Status: AC
  Filled 2022-05-27: qty 30

## 2022-05-27 MED ORDER — BUPIVACAINE-EPINEPHRINE (PF) 0.5% -1:200000 IJ SOLN
INTRAMUSCULAR | Status: DC | PRN
  Administered 2022-05-27: 20 mL via PERINEURAL

## 2022-05-27 MED ORDER — DEXAMETHASONE SODIUM PHOSPHATE 10 MG/ML IJ SOLN
10.0000 mg | Freq: Once | INTRAMUSCULAR | Status: AC
  Administered 2022-05-28: 10 mg via INTRAVENOUS
  Filled 2022-05-27: qty 1

## 2022-05-27 MED ORDER — CHLORHEXIDINE GLUCONATE 0.12 % MT SOLN
15.0000 mL | Freq: Once | OROMUCOSAL | Status: AC
Start: 2022-05-27 — End: 2022-05-27
  Administered 2022-05-27: 15 mL via OROMUCOSAL

## 2022-05-27 MED ORDER — POVIDONE-IODINE 10 % EX SWAB: 2.0000 | Freq: Once | CUTANEOUS | Status: DC

## 2022-05-27 MED ORDER — HYDROCODONE-ACETAMINOPHEN 5-325 MG PO TABS
1.0000 | ORAL_TABLET | ORAL | Status: DC | PRN
  Administered 2022-05-27 – 2022-05-28 (×2): 2 via ORAL
  Filled 2022-05-27 (×3): qty 2

## 2022-05-27 MED ORDER — DOCUSATE SODIUM 100 MG PO CAPS
100.0000 mg | ORAL_CAPSULE | Freq: Two times a day (BID) | ORAL | Status: DC
  Administered 2022-05-27 – 2022-05-28 (×2): 100 mg via ORAL
  Filled 2022-05-27 (×2): qty 1

## 2022-05-27 MED ORDER — METHOCARBAMOL 1000 MG/10ML IJ SOLN: 500.0000 mg | Freq: Four times a day (QID) | INTRAVENOUS | Status: DC | PRN

## 2022-05-27 MED ORDER — ARFORMOTEROL TARTRATE 15 MCG/2ML IN NEBU
15.0000 ug | INHALATION_SOLUTION | Freq: Two times a day (BID) | RESPIRATORY_TRACT | Status: DC
  Administered 2022-05-27 – 2022-05-28 (×2): 15 ug via RESPIRATORY_TRACT
  Filled 2022-05-27 (×3): qty 2

## 2022-05-27 MED ORDER — ORAL CARE MOUTH RINSE: 15.0000 mL | Freq: Once | OROMUCOSAL | Status: AC

## 2022-05-27 MED ORDER — PROPOFOL 1000 MG/100ML IV EMUL
INTRAVENOUS | Status: AC
  Filled 2022-05-27: qty 100

## 2022-05-27 MED ORDER — TRANEXAMIC ACID-NACL 1000-0.7 MG/100ML-% IV SOLN
1000.0000 mg | INTRAVENOUS | Status: AC
  Administered 2022-05-27: 1000 mg via INTRAVENOUS
  Filled 2022-05-27: qty 100

## 2022-05-27 MED ORDER — ACETAMINOPHEN 500 MG PO TABS: 1000.0000 mg | ORAL_TABLET | Freq: Once | ORAL | Status: DC

## 2022-05-27 MED ORDER — RIVAROXABAN 10 MG PO TABS
10.0000 mg | ORAL_TABLET | Freq: Every day | ORAL | Status: DC
  Administered 2022-05-28: 10 mg via ORAL
  Filled 2022-05-27: qty 1

## 2022-05-27 MED ORDER — WATER FOR IRRIGATION, STERILE IR SOLN
Status: DC | PRN
  Administered 2022-05-27: 2000 mL

## 2022-05-27 MED ORDER — OXYCODONE HCL 5 MG/5ML PO SOLN: 5.0000 mg | Freq: Once | ORAL | Status: DC | PRN

## 2022-05-27 MED ORDER — POLYETHYLENE GLYCOL 3350 17 G PO PACK: 17.0000 g | PACK | Freq: Every day | ORAL | Status: DC | PRN

## 2022-05-27 MED ORDER — ONDANSETRON HCL 4 MG/2ML IJ SOLN: 4.0000 mg | Freq: Once | INTRAMUSCULAR | Status: DC | PRN

## 2022-05-27 MED ORDER — ACETAMINOPHEN 10 MG/ML IV SOLN
1000.0000 mg | Freq: Four times a day (QID) | INTRAVENOUS | Status: DC
  Administered 2022-05-27: 1000 mg via INTRAVENOUS
  Filled 2022-05-27: qty 100

## 2022-05-27 MED ORDER — METHOCARBAMOL 500 MG PO TABS
500.0000 mg | ORAL_TABLET | Freq: Four times a day (QID) | ORAL | Status: DC | PRN
  Administered 2022-05-27: 500 mg via ORAL
  Filled 2022-05-27: qty 1

## 2022-05-27 MED ORDER — LIDOCAINE HCL (PF) 2 % IJ SOLN
INTRAMUSCULAR | Status: AC
  Filled 2022-05-27: qty 5

## 2022-05-27 MED ORDER — TRAMADOL HCL 50 MG PO TABS
50.0000 mg | ORAL_TABLET | Freq: Four times a day (QID) | ORAL | Status: DC | PRN
  Administered 2022-05-27: 50 mg via ORAL
  Filled 2022-05-27: qty 1

## 2022-05-27 MED ORDER — MORPHINE SULFATE (PF) 2 MG/ML IV SOLN: 0.5000 mg | INTRAVENOUS | Status: DC | PRN

## 2022-05-27 MED ORDER — MOMETASONE FURO-FORMOTEROL FUM 200-5 MCG/ACT IN AERO: 2.0000 | INHALATION_SPRAY | Freq: Two times a day (BID) | RESPIRATORY_TRACT | Status: DC

## 2022-05-27 MED ORDER — PHENOL 1.4 % MT LIQD: 1.0000 | OROMUCOSAL | Status: DC | PRN

## 2022-05-27 MED ORDER — BISACODYL 10 MG RE SUPP: 10.0000 mg | Freq: Every day | RECTAL | Status: DC | PRN

## 2022-05-27 SURGICAL SUPPLY — 42 items
ARTICULEZE HEAD (Hips) ×1 IMPLANT
BAG COUNTER SPONGE SURGICOUNT (BAG) IMPLANT
BAG DECANTER FOR FLEXI CONT (MISCELLANEOUS) IMPLANT
BAG ZIPLOCK 12X15 (MISCELLANEOUS) IMPLANT
BLADE SAG 18X100X1.27 (BLADE) ×1 IMPLANT
COVER PERINEAL POST (MISCELLANEOUS) ×1 IMPLANT
COVER SURGICAL LIGHT HANDLE (MISCELLANEOUS) ×1 IMPLANT
CUP ACETBLR 54 OD PINNACLE (Hips) IMPLANT
DRAPE FOOT SWITCH (DRAPES) ×1 IMPLANT
DRAPE STERI IOBAN 125X83 (DRAPES) ×1 IMPLANT
DRAPE U-SHAPE 47X51 STRL (DRAPES) ×2 IMPLANT
DRSG AQUACEL AG ADV 3.5X10 (GAUZE/BANDAGES/DRESSINGS) ×1 IMPLANT
DURAPREP 26ML APPLICATOR (WOUND CARE) ×1 IMPLANT
ELECT REM PT RETURN 15FT ADLT (MISCELLANEOUS) ×1 IMPLANT
GLOVE BIO SURGEON STRL SZ 6.5 (GLOVE) IMPLANT
GLOVE BIO SURGEON STRL SZ7.5 (GLOVE) IMPLANT
GLOVE BIO SURGEON STRL SZ8 (GLOVE) ×1 IMPLANT
GLOVE BIOGEL PI IND STRL 6.5 (GLOVE) IMPLANT
GLOVE BIOGEL PI IND STRL 7.0 (GLOVE) IMPLANT
GLOVE BIOGEL PI IND STRL 8 (GLOVE) ×1 IMPLANT
GOWN STRL REUS W/ TWL LRG LVL3 (GOWN DISPOSABLE) ×1 IMPLANT
GOWN STRL REUS W/ TWL XL LVL3 (GOWN DISPOSABLE) IMPLANT
GOWN STRL REUS W/TWL LRG LVL3 (GOWN DISPOSABLE) ×1
GOWN STRL REUS W/TWL XL LVL3 (GOWN DISPOSABLE)
HEAD ARTICULEZE (Hips) IMPLANT
HOLDER FOLEY CATH W/STRAP (MISCELLANEOUS) ×1 IMPLANT
KIT TURNOVER KIT A (KITS) IMPLANT
LINER MARATHON NEUT +4X54X36 (Hips) IMPLANT
MANIFOLD NEPTUNE II (INSTRUMENTS) ×1 IMPLANT
PACK ANTERIOR HIP CUSTOM (KITS) ×1 IMPLANT
PENCIL SMOKE EVACUATOR COATED (MISCELLANEOUS) ×1 IMPLANT
SPIKE FLUID TRANSFER (MISCELLANEOUS) ×1 IMPLANT
STEM FEMORAL SZ 6MM STD ACTIS (Stem) IMPLANT
STRIP CLOSURE SKIN 1/2X4 (GAUZE/BANDAGES/DRESSINGS) ×1 IMPLANT
SUT ETHIBOND NAB CT1 #1 30IN (SUTURE) ×1 IMPLANT
SUT MNCRL AB 4-0 PS2 18 (SUTURE) ×1 IMPLANT
SUT STRATAFIX 0 PDS 27 VIOLET (SUTURE) ×1
SUT VIC AB 2-0 CT1 27 (SUTURE) ×2
SUT VIC AB 2-0 CT1 TAPERPNT 27 (SUTURE) ×2 IMPLANT
SUTURE STRATFX 0 PDS 27 VIOLET (SUTURE) ×1 IMPLANT
TRAY FOLEY MTR SLVR 16FR STAT (SET/KITS/TRAYS/PACK) ×1 IMPLANT
TUBE SUCTION HIGH CAP CLEAR NV (SUCTIONS) ×1 IMPLANT

## 2022-05-27 NOTE — Anesthesia Postprocedure Evaluation (Signed)
Anesthesia Post Note  Patient: Antonio Carlson  Procedure(s) Performed: TOTAL HIP ARTHROPLASTY ANTERIOR APPROACH (Left: Hip)     Patient location during evaluation: PACU Anesthesia Type: Spinal Level of consciousness: awake and alert Pain management: pain level controlled Vital Signs Assessment: post-procedure vital signs reviewed and stable Respiratory status: spontaneous breathing and respiratory function stable Cardiovascular status: blood pressure returned to baseline and stable Postop Assessment: spinal receding and no apparent nausea or vomiting Anesthetic complications: no   No notable events documented.  Last Vitals:  Vitals:   05/27/22 1715 05/27/22 1730  BP: (!) 161/82 (!) 160/75  Pulse: (!) 59 (!) 52  Resp: 18 12  Temp:  (!) 36.4 C  SpO2: 99% 99%    Last Pain:  Vitals:   05/27/22 1730  TempSrc:   PainSc: 0-No pain    LLE Motor Response: Purposeful movement (05/27/22 1730)   RLE Motor Response: Purposeful movement (05/27/22 1730)   L Sensory Level: L3-Anterior knee, lower leg (05/27/22 1730) R Sensory Level: L3-Anterior knee, lower leg (05/27/22 1730)  Audry Pili

## 2022-05-27 NOTE — Transfer of Care (Signed)
Immediate Anesthesia Transfer of Care Note  Patient: Antonio Carlson  Procedure(s) Performed: TOTAL HIP ARTHROPLASTY ANTERIOR APPROACH (Left: Hip)  Patient Location: PACU  Anesthesia Type:MAC and Spinal  Level of Consciousness: oriented, drowsy and patient cooperative  Airway & Oxygen Therapy: Patient Spontanous Breathing and Patient connected to face mask oxygen  Post-op Assessment: Report given to RN and Post -op Vital signs reviewed and stable  Post vital signs: Reviewed  Last Vitals:  Vitals Value Taken Time  BP 130/71 05/27/22 1540  Temp 36.3 C 05/27/22 1540  Pulse 62 05/27/22 1542  Resp 16 05/27/22 1542  SpO2 100 % 05/27/22 1542  Vitals shown include unvalidated device data.  Last Pain:  Vitals:   05/27/22 1226  TempSrc: Oral         Complications: No notable events documented.

## 2022-05-27 NOTE — Discharge Instructions (Addendum)
Gaynelle Arabian, MD Total Joint Specialist [facility] [address] [place], [place] [address] [phone]  ANTERIOR APPROACH TOTAL HIP REPLACEMENT POSTOPERATIVE DIRECTIONS     Hip Rehabilitation, Guidelines Following Surgery  The results of a hip operation are greatly improved after range of motion and muscle strengthening exercises. Follow all safety measures which are given to protect your hip. If any of these exercises cause increased pain or swelling in your joint, decrease the amount until you are comfortable again. Then slowly increase the exercises. Call your caregiver if you have problems or questions.   BLOOD CLOT PREVENTION Take a 10 mg Xarelto once a day for three weeks following surgery. Then resume one 75 mg Plavix once a day. You may resume your vitamins/supplements once you have discontinued the Xarelto.  Information on my medicine - XARELTO (Rivaroxaban)  Why was Xarelto prescribed for you? Xarelto was prescribed for you to reduce the risk of blood clots forming after orthopedic surgery. The medical term for these abnormal blood clots is venous thromboembolism (VTE).  What do you need to know about xarelto ? Take your Xarelto ONCE DAILY at the same time every day. You may take it either with or without food.  If you have difficulty swallowing the tablet whole, you may crush it and mix in applesauce just prior to taking your dose.  Take Xarelto exactly as prescribed by your doctor and DO NOT stop taking Xarelto without talking to the doctor who prescribed the medication.  Stopping without other VTE prevention medication to take the place of Xarelto may increase your risk of developing a clot.  After discharge, you should have regular check-up appointments with your healthcare provider that is prescribing your Xarelto.    What do you do if you miss a dose? If you miss a dose, take it as soon as you remember on the same day then  continue your regularly scheduled once daily regimen the next day. Do not take two doses of Xarelto on the same day.   Important Safety Information A possible side effect of Xarelto is bleeding. You should call your healthcare provider right away if you experience any of the following: Bleeding from an injury or your nose that does not stop. Unusual colored urine (red or dark brown) or unusual colored stools (red or black). Unusual bruising for unknown reasons. A serious fall or if you hit your head (even if there is no bleeding).  Some medicines may interact with Xarelto and might increase your risk of bleeding while on Xarelto. To help avoid this, consult your healthcare provider or pharmacist prior to using any new prescription or non-prescription medications, including herbals, vitamins, non-steroidal anti-inflammatory drugs (NSAIDs) and supplements.  This website has more information on Xarelto: [email or web address]://guerra-benson.com/.    HOME CARE INSTRUCTIONS  Remove items at home which could result in a fall. This includes throw rugs or furniture in walking pathways.  ICE to the affected hip as frequently as 20-30 minutes an hour and then as needed for pain and swelling. Continue to use ice on the hip for pain and swelling from surgery. You may notice swelling that will progress down to the foot and ankle. This is normal after surgery. Elevate the leg when you are not up walking on it.   Continue to use the breathing machine which will help keep your temperature down.  It is common for your temperature to cycle up and down following surgery, especially at night when you are not  up moving around and exerting yourself.  The breathing machine keeps your lungs expanded and your temperature down.  DIET You may resume your previous home diet once your are discharged from the hospital.  DRESSING / WOUND CARE / SHOWERING You have an adhesive waterproof bandage over the incision. Leave this in place until your  first follow-up appointment. Once you remove this you will not need to place another bandage.  You may begin showering 3 days following surgery, but do not submerge the incision under water.  ACTIVITY For the first 3-5 days, it is important to rest and keep the operative leg elevated. You should, as a general rule, rest for 50 minutes and walk/stretch for 10 minutes per hour. After 5 days, you may slowly increase activity as tolerated.  Perform the exercises you were provided twice a day for about 15-20 minutes each session. Begin these 2 days following surgery. Walk with your walker as instructed. Use the walker until you are comfortable transitioning to a cane. Walk with the cane in the opposite hand of the operative leg. You may discontinue the cane once you are comfortable and walking steadily. Avoid periods of inactivity such as sitting longer than an hour when not asleep. This helps prevent blood clots.  Do not drive a car for 6 weeks or until released by your surgeon.  Do not drive while taking narcotics.  TED HOSE STOCKINGS Wear the elastic stockings on both legs for three weeks following surgery during the day. You may remove them at night while sleeping.  WEIGHT BEARING Weight bearing as tolerated with assist device (walker, cane, etc) as directed, use it as long as suggested by your surgeon or therapist, typically at least 4-6 weeks.  POSTOPERATIVE CONSTIPATION PROTOCOL Constipation - defined medically as fewer than three stools per week and severe constipation as less than one stool per week.  One of the most common issues patients have following surgery is constipation.  Even if you have a regular bowel pattern at home, your normal regimen is likely to be disrupted due to multiple reasons following surgery.  Combination of anesthesia, postoperative narcotics, change in appetite and fluid intake all can affect your bowels.  In order to avoid complications following surgery, here are  some recommendations in order to help you during your recovery period.  Colace (docusate) - Pick up an over-the-counter form of Colace or another stool softener and take twice a day as long as you are requiring postoperative pain medications.  Take with a full glass of water daily.  If you experience loose stools or diarrhea, hold the colace until you stool forms back up.  If your symptoms do not get better within 1 week or if they get worse, check with your doctor. Dulcolax (bisacodyl) - Pick up over-the-counter and take as directed by the product packaging as needed to assist with the movement of your bowels.  Take with a full glass of water.  Use this product as needed if not relieved by Colace only.  MiraLax (polyethylene glycol) - Pick up over-the-counter to have on hand.  MiraLax is a solution that will increase the amount of water in your bowels to assist with bowel movements.  Take as directed and can mix with a glass of water, juice, soda, coffee, or tea.  Take if you go more than two days without a movement.Do not use MiraLax more than once per day. Call your doctor if you are still constipated or irregular after using this medication  for 7 days in a row.  If you continue to have problems with postoperative constipation, please contact the office for further assistance and recommendations.  If you experience "the worst abdominal pain ever" or develop nausea or vomiting, please contact the office immediatly for further recommendations for treatment.  ITCHING  If you experience itching with your medications, try taking only a single pain pill, or even half a pain pill at a time.  You can also use Benadryl over the counter for itching or also to help with sleep.   MEDICATIONS See your medication summary on the "After Visit Summary" that the nursing staff will review with you prior to discharge.  You may have some home medications which will be placed on hold until you complete the course of blood  thinner medication.  It is important for you to complete the blood thinner medication as prescribed by your surgeon.  Continue your approved medications as instructed at time of discharge.  PRECAUTIONS If you experience chest pain or shortness of breath - call 911 immediately for transfer to the hospital emergency department.  If you develop a fever greater that 101 F, purulent drainage from wound, increased redness or drainage from wound, foul odor from the wound/dressing, or calf pain - CONTACT YOUR SURGEON.                                                   FOLLOW-UP APPOINTMENTS Make sure you keep all of your appointments after your operation with your surgeon and caregivers. You should call the office at the above phone number and make an appointment for approximately two weeks after the date of your surgery or on the date instructed by your surgeon outlined in the "After Visit Summary".  RANGE OF MOTION AND STRENGTHENING EXERCISES  These exercises are designed to help you keep full movement of your hip joint. Follow your caregiver's or physical therapist's instructions. Perform all exercises about fifteen times, three times per day or as directed. Exercise both hips, even if you have had only one joint replacement. These exercises can be done on a training (exercise) mat, on the floor, on a table or on a bed. Use whatever works the best and is most comfortable for you. Use music or television while you are exercising so that the exercises are a pleasant break in your day. This will make your life better with the exercises acting as a break in routine you can look forward to.  Lying on your back, slowly slide your foot toward your buttocks, raising your knee up off the floor. Then slowly slide your foot back down until your leg is straight again.  Lying on your back spread your legs as far apart as you can without causing discomfort.  Lying on your side, raise your upper leg and foot straight up  from the floor as far as is comfortable. Slowly lower the leg and repeat.  Lying on your back, tighten up the muscle in the front of your thigh (quadriceps muscles). You can do this by keeping your leg straight and trying to raise your heel off the floor. This helps strengthen the largest muscle supporting your knee.  Lying on your back, tighten up the muscles of your buttocks both with the legs straight and with the knee bent at a comfortable angle while keeping your  heel on the floor.   POST-OPERATIVE OPIOID TAPER INSTRUCTIONS: It is important to wean off of your opioid medication as soon as possible. If you do not need pain medication after your surgery it is ok to stop day one. Opioids include: Codeine, Hydrocodone(Norco, Vicodin), Oxycodone(Percocet, oxycontin) and hydromorphone amongst others.  Long term and even short term use of opiods can cause: Increased pain response Dependence Constipation Depression Respiratory depression And more.  Withdrawal symptoms can include Flu like symptoms Nausea, vomiting And more Techniques to manage these symptoms Hydrate well Eat regular healthy meals Stay active Use relaxation techniques(deep breathing, meditating, yoga) Do Not substitute Alcohol to help with tapering If you have been on opioids for less than two weeks and do not have pain than it is ok to stop all together.  Plan to wean off of opioids This plan should start within one week post op of your joint replacement. Maintain the same interval or time between taking each dose and first decrease the dose.  Cut the total daily intake of opioids by one tablet each day Next start to increase the time between doses. The last dose that should be eliminated is the evening dose.   IF YOU ARE TRANSFERRED TO A SKILLED REHAB FACILITY If the patient is transferred to a skilled rehab facility following release from the hospital, a list of the current medications will be sent to the facility  for the patient to continue.  When discharged from the skilled rehab facility, please have the facility set up the patient's [place] prior to being released. Also, the skilled facility will be responsible for providing the patient with their medications at time of release from the facility to include their pain medication, the muscle relaxants, and their blood thinner medication. If the patient is still at the rehab facility at time of the two week follow up appointment, the skilled rehab facility will also need to assist the patient in arranging follow up appointment in our office and any transportation needs.  MAKE SURE YOU:  Understand these instructions.  Get help right away if you are not doing well or get worse.    DENTAL ANTIBIOTICS:  In most cases prophylactic antibiotics for Dental procdeures after total joint surgery are not necessary.  Exceptions are as follows:  1. History of prior total joint infection  2. Severely immunocompromised (Organ Transplant, cancer chemotherapy, Rheumatoid biologic meds such as [place])  3. Poorly controlled diabetes (A1C &gt; 8.0, blood glucose over 200)  If you have one of these conditions, contact your surgeon for an antibiotic prescription, prior to your dental procedure.    Pick up stool softner and laxative for home use following surgery while on pain medications. Do not submerge incision under water. Please use good hand washing techniques while changing dressing each day. May shower starting three days after surgery. Please use a clean towel to pat the incision dry following showers. Continue to use ice for pain and swelling after surgery. Do not use any lotions or creams on the incision until instructed by your surgeon.

## 2022-05-27 NOTE — Plan of Care (Signed)
  Problem: Activity: Goal: Ability to avoid complications of mobility impairment will improve Outcome: Progressing   

## 2022-05-27 NOTE — Interval H&P Note (Signed)
History and Physical Interval Note:  05/27/2022 12:39 PM  Antonio Carlson  has presented today for surgery, with the diagnosis of left hip osteoarthritis.  The various methods of treatment have been discussed with the patient and family. After consideration of risks, benefits and other options for treatment, the patient has consented to  Procedure(s): TOTAL HIP ARTHROPLASTY ANTERIOR APPROACH (Left) as a surgical intervention.  The patient's history has been reviewed, patient examined, no change in status, stable for surgery.  I have reviewed the patient's chart and labs.  Questions were answered to the patient's satisfaction.     Pilar Plate Keith Felten

## 2022-05-27 NOTE — Anesthesia Procedure Notes (Addendum)
Spinal  Patient location during procedure: OR Start time: 05/27/2022 1:50 PM End time: 05/27/2022 2:01 PM Reason for block: surgical anesthesia Staffing Performed: anesthesiologist  Anesthesiologist: Audry Pili, MD Performed by: Audry Pili, MD Authorized by: Audry Pili, MD   Preanesthetic Checklist Completed: patient identified, IV checked, risks and benefits discussed, surgical consent, monitors and equipment checked, pre-op evaluation and timeout performed Spinal Block Patient position: sitting Prep: DuraPrep Patient monitoring: heart rate, cardiac monitor, continuous pulse ox and blood pressure Approach: right paramedian Location: L2-3 Injection technique: single-shot Needle Needle type: Quincke  Needle gauge: 22 G Assessment Events: second provider Additional Notes Consent was obtained prior to the procedure with all questions answered and concerns addressed. Risks including, but not limited to, bleeding, infection, nerve damage, paralysis, failed block, inadequate analgesia, allergic reaction, high spinal, itching, and headache were discussed and the patient wished to proceed. Functioning IV was confirmed and monitors were applied. Sterile prep and drape, including hand hygiene, mask, and sterile gloves were used. The patient was positioned and the spine was prepped. The skin was anesthetized with lidocaine. After 2 failed attempts by CRNA, Dr. Fransisco Beau successful with right paramedian approach. Free flow of clear CSF was obtained prior to injecting local anesthetic into the CSF. The spinal needle aspirated freely following injection. The needle was carefully withdrawn. The patient tolerated the procedure well.   Renold Don, MD

## 2022-05-27 NOTE — Plan of Care (Signed)
  Problem: Activity: Goal: Ability to avoid complications of mobility impairment will improve Outcome: Progressing Goal: Ability to tolerate increased activity will improve Outcome: Progressing   Problem: Pain Management: Goal: Pain level will decrease with appropriate interventions Outcome: Progressing   Problem: Safety: Goal: Ability to remain free from injury will improve Outcome: Progressing

## 2022-05-27 NOTE — Op Note (Signed)
OPERATIVE REPORT- TOTAL HIP ARTHROPLASTY   PREOPERATIVE DIAGNOSIS: Osteoarthritis of the Left hip.   POSTOPERATIVE DIAGNOSIS: Osteoarthritis of the Left  hip.   PROCEDURE: Left total hip arthroplasty, anterior approach.   SURGEON: Gaynelle Arabian, MD   ASSISTANT: Molli Barrows, PA-C  ANESTHESIA:  Spinal  ESTIMATED BLOOD LOSS:-300 mL    DRAINS: None  COMPLICATIONS: None   CONDITION: PACU - hemodynamically stable.   BRIEF CLINICAL NOTE: Antonio Carlson is a 76 y.o. male who has advanced end-  stage arthritis of their Left  hip with progressively worsening pain and  dysfunction.The patient has failed nonoperative management and presents for  total hip arthroplasty.   PROCEDURE IN DETAIL: After successful administration of spinal  anesthetic, the traction boots for the [facility] bed were placed on both  feet and the patient was placed onto the [facility] bed, boots placed into the leg  holders. The Left hip was then isolated from the perineum with plastic  drapes and prepped and draped in the usual sterile fashion. ASIS and  greater trochanter were marked and a oblique incision was made, starting  at about 1 cm lateral and 2 cm distal to the ASIS and coursing towards  the anterior cortex of the femur. The skin was cut with a 10 blade  through subcutaneous tissue to the level of the fascia overlying the  tensor fascia lata muscle. The fascia was then incised in line with the  incision at the junction of the anterior third and posterior 2/3rd. The  muscle was teased off the fascia and then the interval between the TFL  and the rectus was developed. The Hohmann retractor was then placed at  the top of the femoral neck over the capsule. The vessels overlying the  capsule were cauterized and the fat on top of the capsule was removed.  A Hohmann retractor was then placed anterior underneath the rectus  femoris to give exposure to the entire anterior capsule. A T-shaped  capsulotomy was  performed. The edges were tagged and the femoral head  was identified.       Osteophytes are removed off the superior acetabulum.  The femoral neck was then cut in situ with an oscillating saw. Traction  was then applied to the left lower extremity utilizing the [facility]  traction. The femoral head was then removed. Retractors were placed  around the acetabulum and then circumferential removal of the labrum was  performed. Osteophytes were also removed. Reaming starts at 49 mm to  medialize and  Increased in 2 mm increments to 53 mm. We reamed in  approximately 40 degrees of abduction, 20 degrees anteversion. A 54 mm  pinnacle acetabular shell was then impacted in anatomic position under  fluoroscopic guidance with excellent purchase. We did not need to place  any additional dome screws. A 36 mm neutral + 4 marathon liner was then  placed into the acetabular shell.       The femoral lift was then placed along the lateral aspect of the femur  just distal to the vastus ridge. The leg was  externally rotated and capsule  was stripped off the inferior aspect of the femoral neck down to the  level of the lesser trochanter, this was done with electrocautery. The femur was lifted after this was performed. The  leg was then placed in an extended and adducted position essentially delivering the femur. We also removed the capsule superiorly and the piriformis from the piriformis fossa to  gain excellent exposure of the  proximal femur. Rongeur was used to remove some cancellous bone to get  into the lateral portion of the proximal femur for placement of the  initial starter reamer. The starter broaches was placed  the starter broach  and was shown to go down the center of the canal. Broaching  with the Actis system was then performed starting at size 0  coursing  Up to size 6. A size 6 had excellent torsional and rotational  and axial stability. The trial standard offset neck was then placed  with a 36 + 5  trial head. The hip was then reduced. We confirmed that  the stem was in the canal both on AP and lateral x-rays. It also has excellent sizing. The hip was reduced with outstanding stability through full extension and full external rotation.. AP pelvis was taken and the leg lengths were measured and found to be equal. Hip was then dislocated again and the femoral head and neck removed. The  femoral broach was removed. Size 6 Actis stem with a standard offset  neck was then impacted into the femur following native anteversion. Has  excellent purchase in the canal. Excellent torsional and rotational and  axial stability. It is confirmed to be in the canal on AP and lateral  fluoroscopic views. The 36 + 5 metal head was placed and the hip  reduced with outstanding stability. Again AP pelvis was taken and it  confirmed that the leg lengths were equal. The wound was then copiously  irrigated with saline solution and the capsule reattached and repaired  with Ethibond suture. 30 ml of .25% Bupivicaine was  injected into the capsule and into the edge of the tensor fascia lata as well as subcutaneous tissue. The fascia overlying the tensor fascia lata was then closed with a running #1 V-Loc. Subcu was closed with interrupted 2-0 Vicryl and subcuticular running 4-0 Monocryl. Incision was cleaned  and dried. Steri-Strips and a bulky sterile dressing applied. The patient was awakened and transported to  recovery in stable condition.        Please note that a surgical assistant was a medical necessity for this procedure to perform it in a safe and expeditious manner. Assistant was necessary to provide appropriate retraction of vital neurovascular structures and to prevent femoral fracture and allow for anatomic placement of the prosthesis.  Gaynelle Arabian, M.D.

## 2022-05-27 NOTE — Care Plan (Signed)
Ortho Bundle Case Management Note  Patient Details  Name: Antonio Carlson MRN: [id number] Date of Birth: 05-05-1946  L THA on 05-27-22 DCP:  Home with wife DME:  No needs, has a RW PT:  HEP                   DME Arranged:  N/A DME Agency:  NA  HH Arranged:  NA HH Agency:  NA  Additional Comments: Please contact me with any questions of if this plan should need to change.  Marianne Sofia, RN,CCM [facility]  [phone] 05/27/2022, 12:01 PM

## 2022-05-28 ENCOUNTER — Telehealth (HOSPITAL_COMMUNITY): Payer: Self-pay

## 2022-05-28 ENCOUNTER — Encounter (HOSPITAL_COMMUNITY): Payer: Self-pay | Admitting: Orthopedic Surgery

## 2022-05-28 ENCOUNTER — Other Ambulatory Visit (HOSPITAL_COMMUNITY): Payer: Self-pay

## 2022-05-28 DIAGNOSIS — J45909 Unspecified asthma, uncomplicated: Secondary | ICD-10-CM | POA: Diagnosis not present

## 2022-05-28 DIAGNOSIS — I10 Essential (primary) hypertension: Secondary | ICD-10-CM | POA: Diagnosis not present

## 2022-05-28 DIAGNOSIS — Z79899 Other long term (current) drug therapy: Secondary | ICD-10-CM | POA: Diagnosis not present

## 2022-05-28 DIAGNOSIS — Z951 Presence of aortocoronary bypass graft: Secondary | ICD-10-CM | POA: Diagnosis not present

## 2022-05-28 DIAGNOSIS — M1612 Unilateral primary osteoarthritis, left hip: Secondary | ICD-10-CM | POA: Diagnosis not present

## 2022-05-28 DIAGNOSIS — I2510 Atherosclerotic heart disease of native coronary artery without angina pectoris: Secondary | ICD-10-CM | POA: Diagnosis not present

## 2022-05-28 LAB — CBC
HCT: 41 % (ref 39.0–52.0)
Hemoglobin: 13.4 g/dL (ref 13.0–17.0)
MCH: 28.3 pg (ref 26.0–34.0)
MCHC: 32.7 g/dL (ref 30.0–36.0)
MCV: 86.5 fL (ref 80.0–100.0)
Platelets: 319 10*3/uL (ref 150–400)
RBC: 4.74 MIL/uL (ref 4.22–5.81)
RDW: 14.8 % (ref 11.5–15.5)
WBC: 19.9 10*3/uL — ABNORMAL HIGH (ref 4.0–10.5)
nRBC: 0 % (ref 0.0–0.2)

## 2022-05-28 LAB — BASIC METABOLIC PANEL
Anion gap: 10 (ref 5–15)
BUN: 19 mg/dL (ref 8–23)
CO2: 24 mmol/L (ref 22–32)
Calcium: 8.8 mg/dL — ABNORMAL LOW (ref 8.9–10.3)
Chloride: 102 mmol/L (ref 98–111)
Creatinine, Ser: 0.92 mg/dL (ref 0.61–1.24)
GFR, Estimated: >60 mL/min (ref >60)
Glucose, Bld: 134 mg/dL — ABNORMAL HIGH (ref 70–99)
Potassium: 3.9 mmol/L (ref 3.5–5.1)
Sodium: 136 mmol/L (ref 135–145)

## 2022-05-28 MED ORDER — TRAMADOL HCL 50 MG PO TABS: 50.0000 mg | ORAL_TABLET | Freq: Four times a day (QID) | ORAL | 0 refills | Status: DC | PRN

## 2022-05-28 MED ORDER — HYDROCODONE-ACETAMINOPHEN 5-325 MG PO TABS: 1.0000 | ORAL_TABLET | Freq: Four times a day (QID) | ORAL | 0 refills | Status: DC | PRN

## 2022-05-28 MED ORDER — METHOCARBAMOL 500 MG PO TABS: 500.0000 mg | ORAL_TABLET | Freq: Four times a day (QID) | ORAL | 0 refills | Status: DC | PRN

## 2022-05-28 MED ORDER — RIVAROXABAN 10 MG PO TABS: 10.0000 mg | ORAL_TABLET | Freq: Every day | ORAL | 0 refills | Status: AC

## 2022-05-28 NOTE — TOC Benefit Eligibility Note (Signed)
Patient Teacher, English as a foreign language completed.    The patient is currently admitted and upon discharge could be taking Xarelto 20 mg.  The current 30 day co-pay is $47.00.   The patient is insured through [place], [place] Patient Advocate Specialist [place] Patient Advocate Team Direct Number: [phone] Fax: [phone]

## 2022-05-28 NOTE — Telephone Encounter (Signed)
Pharmacy Patient Advocate Encounter  Insurance verification completed.    The patient is insured through [employer]   The patient is currently admitted and ran test claims for the following: Xarelto.  Copays and coinsurance results were relayed to Inpatient clinical team.

## 2022-05-28 NOTE — Progress Notes (Signed)
   Subjective: 1 Day Post-Op Procedure(s) (LRB): TOTAL HIP ARTHROPLASTY ANTERIOR APPROACH (Left) Patient reports pain as mild.   Patient is well, and has had no acute complaints or problems No issues overnight. Denies chest pain or SOB. Foley catheter removed this AM. Pt will work with PT today.  Objective: Vital signs in last 24 hours: Temp:  [97.4 F (36.3 C)-98.3 F (36.8 C)] 98.3 F (36.8 C) (11/02 0937) Pulse Rate:  [49-79] 76 (11/02 0937) Resp:  [10-18] 17 (11/02 0937) BP: (123-171)/(64-93) 123/64 (11/02 0937) SpO2:  [95 %-100 %] 96 % (11/02 0937) Weight:  [79 kg] 79 kg (11/01 1841)  Intake/Output from previous day:  Intake/Output Summary (Last 24 hours) at 05/28/2022 1131 Last data filed at 05/28/2022 0620 Gross per 24 hour  Intake 2658.73 ml  Output 2250 ml  Net 408.73 ml     Intake/Output this shift: No intake/output data recorded.  Labs: Recent Labs    05/28/22 0345  HGB 13.4   Recent Labs    05/28/22 0345  WBC 19.9*  RBC 4.74  HCT 41.0  PLT 319   Recent Labs    05/28/22 0345  NA 136  K 3.9  CL 102  CO2 24  BUN 19  CREATININE 0.92  GLUCOSE 134*  CALCIUM 8.8*   No results for input(s): "LABPT", "INR" in the last 72 hours.  Exam: General - Patient is Alert, Oriented, and resting comfortably Extremity - Neurovascular intact Incision: no drainage Compartment soft Dressing - no drainage Motor Function - intact, moving foot and toes well on exam.   Past Medical History:  Diagnosis Date   Allergy    Anginal pain (HCC)    Arthritis    Asthma    Benign prostatic hypertrophy    Coronary artery disease    Diverticulosis    Dyspnea    GERD (gastroesophageal reflux disease)    History of kidney stones    Hyperlipidemia    Hypertension    Nephrolithiasis    Pleural effusion on right    Pneumonia    PVC's (premature ventricular contractions)    RECTAL BLEEDING 09/15/2010   Qualifier: Diagnosis of  By: Trellis Paganini PA-c, Amy S    Shingles     twice   Syncope     Assessment/Plan: 1 Day Post-Op Procedure(s) (LRB): TOTAL HIP ARTHROPLASTY ANTERIOR APPROACH (Left) Principal Problem:   OA (osteoarthritis) of hip Active Problems:   Primary osteoarthritis of left hip  Estimated body mass index is 26.48 kg/m as calculated from the following:   Height as of this encounter: '5\' 8"'$  (1.727 m).   Weight as of this encounter: 79 kg. Up with therapy  DVT Prophylaxis - Xarelto Weight bearing as tolerated. Continue therapy.  Plan is to go Home with HEP after hospital stay. Plan for discharge later today with HEP if progresses with therapy and meeting goals. Follow-up in the office in 2 weeks.  The PDMP database was reviewed today prior to any opioid medications being prescribed to this patient.  Shearon Balo, PA-C Orthopedic Surgery 05/28/2022, 11:31 AM

## 2022-05-28 NOTE — Progress Notes (Signed)
Physical Therapy Treatment Patient Details Name: Antonio Carlson MRN: [id number] DOB: September 26, 1945 Today's Date: 05/28/2022   History of Present Illness Pt s/p L THR and with hx of CAD and CABG    PT Comments    Pt very motivated and progressing well with mobility. Pt up to ambulate in hall, negotiated stairs, reviewed car transfers and reviewed HEP with written instruction provided.  Pt eager for dc home this date.  Recommendations for follow up therapy are one component of a multi-disciplinary discharge planning process, led by the attending physician.  Recommendations may be updated based on patient status, additional functional criteria and insurance authorization.  Follow Up Recommendations  Follow physician's recommendations for discharge plan and follow up therapies     Assistance Recommended at Discharge Intermittent Supervision/Assistance  Patient can return home with the following A little help with walking and/or transfers;A little help with bathing/dressing/bathroom;Assistance with cooking/housework;Assist for transportation;Help with stairs or ramp for entrance   Equipment Recommendations  None recommended by PT    Recommendations for Other Services       Precautions / Restrictions Precautions Precautions: Fall Restrictions Weight Bearing Restrictions: No Other Position/Activity Restrictions: WBAT     Mobility  Bed Mobility Overal bed mobility: Needs Assistance Bed Mobility: Supine to Sit     Supine to sit: Min assist     General bed mobility comments: Pt up in hair and requests back to same    Transfers Overall transfer level: Needs assistance Equipment used: Rolling walker (2 wheels) Transfers: Sit to/from Stand Sit to Stand: Min guard, Supervision           General transfer comment: cues for LE management and use of UEs to self assist    Ambulation/Gait Ambulation/Gait assistance: Min guard, Supervision Gait Distance (Feet): 145  Feet Assistive device: Rolling walker (2 wheels) Gait Pattern/deviations: Step-to pattern, Step-through pattern, Decreased step length - right, Decreased step length - left, Shuffle, Trunk flexed Gait velocity: decr     General Gait Details: cues for sequence, posture and position from RW   Stairs Stairs: Yes Stairs assistance: Min guard Stair Management: No rails, Step to pattern, Forwards, With walker Number of Stairs: 2 General stair comments: single step twice with cues for sequence   Wheelchair Mobility    Modified Rankin (Stroke Patients Only)       Balance Overall balance assessment: Needs assistance Sitting-balance support: No upper extremity supported, Feet supported Sitting balance-Leahy Scale: Good     Standing balance support: No upper extremity supported Standing balance-Leahy Scale: Fair                              Cognition Arousal/Alertness: Awake/alert Behavior During Therapy: WFL for tasks assessed/performed Overall Cognitive Status: Within Functional Limits for tasks assessed                                          Exercises Total Joint Exercises Ankle Circles/Pumps: AROM, Both, 15 reps, Supine Quad Sets: AROM, Both, Supine, 5 reps Heel Slides: AAROM, Left, Supine, 10 reps Hip ABduction/ADduction: AAROM, Left, Supine, 10 reps Long Arc Quad: AROM, Left, 10 reps, Seated    General Comments        Pertinent Vitals/Pain Pain Assessment Pain Assessment: 0-10 Pain Score: 3  Pain Location: L hip Pain Descriptors / Indicators: Aching, Burning Pain Intervention(s):  Limited activity within patient's tolerance, Monitored during session, Premedicated before session    [place] expects to be discharged to:: Private residence Living Arrangements: Spouse/significant other Available Help at Discharge: Family Type of Home: House Home Access: Stairs to enter   Technical brewer of Steps: 1    Home Layout: One level [place]: Conservation officer, nature (2 wheels);Cane - single point      Prior Function            PT Goals (current goals can now be found in the care plan section) Acute Rehab PT Goals Patient Stated Goal: Regain IND PT Goal Formulation: With patient Time For Goal Achievement: 06/04/22 Potential to Achieve Goals: Good Progress towards PT goals: Progressing toward goals    Frequency    7X/week      PT Plan Current plan remains appropriate    Co-evaluation              AM-PAC PT "6 Clicks" Mobility   Outcome Measure  Help needed turning from your back to your side while in a flat bed without using bedrails?: A Little Help needed moving from lying on your back to sitting on the side of a flat bed without using bedrails?: A Little Help needed moving to and from a bed to a chair (including a wheelchair)?: A Little Help needed standing up from a chair using your arms (e.g., wheelchair or bedside chair)?: A Little Help needed to walk in hospital room?: A Little Help needed climbing 3-5 steps with a railing? : A Little 6 Click Score: 18    End of Session Equipment Utilized During Treatment: Gait belt Activity Tolerance: Patient tolerated treatment well Patient left: in chair;with call bell/phone within reach;with chair alarm set Nurse Communication: Mobility status PT Visit Diagnosis: Difficulty in walking, not elsewhere classified (R26.2)     Time: 2992-4268 PT Time Calculation (min) (ACUTE ONLY): 28 min  Charges:  $Gait Training: 8-22 mins $Therapeutic Exercise: 8-22 mins                     Antonio Carlson PT Acute Rehabilitation Services Pager [phone] Office [phone]    Antonio Carlson 05/28/2022, 12:17 PM

## 2022-05-28 NOTE — Evaluation (Signed)
Physical Therapy Evaluation Patient Details Name: Antonio Carlson MRN: [id number] DOB: June 20, 1946 Today's Date: 05/28/2022  History of Present Illness  Pt s/p L THR and with hx of CAD and CABG  Clinical Impression  Pt s/p L THR and presents with decreased L LE strength/ROM and post op pain limiting functional mobility.  Pt should progress well to dc home this date.     Recommendations for follow up therapy are one component of a multi-disciplinary discharge planning process, led by the attending physician.  Recommendations may be updated based on patient status, additional functional criteria and insurance authorization.  Follow Up Recommendations Follow physician's recommendations for discharge plan and follow up therapies      Assistance Recommended at Discharge Intermittent Supervision/Assistance  Patient can return home with the following  A little help with walking and/or transfers;A little help with bathing/dressing/bathroom;Assistance with cooking/housework;Assist for transportation;Help with stairs or ramp for entrance    Equipment Recommendations None recommended by PT  Recommendations for Other Services       Functional Status Assessment Patient has had a recent decline in their functional status and demonstrates the ability to make significant improvements in function in a reasonable and predictable amount of time.     Precautions / Restrictions Precautions Precautions: Fall Restrictions Weight Bearing Restrictions: No Other Position/Activity Restrictions: WBAT      Mobility  Bed Mobility Overal bed mobility: Needs Assistance Bed Mobility: Supine to Sit     Supine to sit: Min assist     General bed mobility comments: cues for sequence and use of R LE to self assist    Transfers Overall transfer level: Needs assistance Equipment used: Rolling walker (2 wheels) Transfers: Sit to/from Stand Sit to Stand: Min assist, Min guard           General transfer  comment: cues for LE management and use of UEs to self assist    Ambulation/Gait Ambulation/Gait assistance: Min assist, Min guard Gait Distance (Feet): 135 Feet Assistive device: Rolling walker (2 wheels) Gait Pattern/deviations: Step-to pattern, Step-through pattern, Decreased step length - right, Decreased step length - left, Shuffle, Trunk flexed Gait velocity: decr     General Gait Details: cues for sequence, posture and position from [employer]            Wheelchair Mobility    Modified Rankin (Stroke Patients Only)       Balance Overall balance assessment: Needs assistance Sitting-balance support: No upper extremity supported, Feet supported Sitting balance-Leahy Scale: Good     Standing balance support: Single extremity supported Standing balance-Leahy Scale: Poor                               Pertinent Vitals/Pain Pain Assessment Pain Assessment: 0-10 Pain Score: 4  Pain Location: L hip Pain Descriptors / Indicators: Aching, Burning Pain Intervention(s): Limited activity within patient's tolerance, Monitored during session, Premedicated before session, Ice applied    Home Living Family/patient expects to be discharged to:: Private residence Living Arrangements: Spouse/significant other Available Help at Discharge: Family Type of Home: House Home Access: Stairs to enter   Technical brewer of Steps: 1   Home Layout: One level Home Equipment: Conservation officer, nature (2 wheels);Cane - single point      Prior Function Prior Level of Function : Independent/Modified Independent             Mobility Comments: using RW or cane 2* hip pain  Hand Dominance        Extremity/Trunk Assessment   Upper Extremity Assessment Upper Extremity Assessment: Overall WFL for tasks assessed    Lower Extremity Assessment Lower Extremity Assessment: LLE deficits/detail LLE Deficits / Details: 2+/5 strength at hip with AAROM at hip to 90  flex and 20 abd    Cervical / Trunk Assessment Cervical / Trunk Assessment: Normal  Communication   Communication: No difficulties  Cognition Arousal/Alertness: Awake/alert Behavior During Therapy: WFL for tasks assessed/performed Overall Cognitive Status: Within Functional Limits for tasks assessed                                          General Comments      Exercises Total Joint Exercises Ankle Circles/Pumps: AROM, Both, 15 reps, Supine Quad Sets: AROM, Both, 10 reps, Supine Heel Slides: AAROM, Left, 20 reps, Supine Hip ABduction/ADduction: AAROM, Left, 15 reps, Supine   Assessment/Plan    PT Assessment Patient needs continued PT services  PT Problem List Decreased strength;Decreased range of motion;Decreased activity tolerance;Decreased balance;Decreased mobility;Decreased knowledge of use of DME;Pain       PT Treatment Interventions DME instruction;Gait training;Stair training;Functional mobility training;Therapeutic activities;Therapeutic exercise;Patient/family education    PT Goals (Current goals can be found in the Care Plan section)  Acute Rehab PT Goals Patient Stated Goal: Regain IND PT Goal Formulation: With patient Time For Goal Achievement: 06/04/22 Potential to Achieve Goals: Good    Frequency 7X/week     Co-evaluation               AM-PAC PT "6 Clicks" Mobility  Outcome Measure Help needed turning from your back to your side while in a flat bed without using bedrails?: A Little Help needed moving from lying on your back to sitting on the side of a flat bed without using bedrails?: A Little Help needed moving to and from a bed to a chair (including a wheelchair)?: A Little Help needed standing up from a chair using your arms (e.g., wheelchair or bedside chair)?: A Little Help needed to walk in hospital room?: A Little Help needed climbing 3-5 steps with a railing? : A Little 6 Click Score: 18    End of Session Equipment  Utilized During Treatment: Gait belt Activity Tolerance: Patient tolerated treatment well Patient left: in chair;with call bell/phone within reach;with chair alarm set Nurse Communication: Mobility status PT Visit Diagnosis: Difficulty in walking, not elsewhere classified (R26.2)    Time: 7494-4967 PT Time Calculation (min) (ACUTE ONLY): 35 min   Charges:   PT Evaluation $PT Eval Low Complexity: 1 Low PT Treatments $Therapeutic Exercise: 8-22 mins        Debe Coder PT Acute Rehabilitation Services Pager [phone] Office [phone]   Hillari Zumwalt 05/28/2022, 11:26 AM

## 2022-05-28 NOTE — TOC Transition Note (Signed)
Transition of Care [facility]) - CM/SW Discharge Note   Patient Details  Name: Antonio Carlson MRN: [id number] Date of Birth: May 11, 1946  [facility] CM/SW Contact:  Lennart Pall, LCSW Phone Number: 05/28/2022, 9:58 AM   Clinical Narrative:    Met with pt and confirming he has needed DME at home.  Plan for HEP.  No TOC needs.   Final next level of care: Home/Self Care Barriers to Discharge: No Barriers Identified   Patient Goals and CMS Choice Patient states their goals for this hospitalization and ongoing recovery are:: return home      Discharge Placement                       Discharge Plan and Services                DME Arranged: N/A DME Agency: NA       HH Arranged: NA HH Agency: NA        Social Determinants of Health (SDOH) Interventions     Readmission Risk Interventions     No data to display

## 2022-05-29 NOTE — Discharge Summary (Signed)
Patient ID: Antonio Carlson MRN: [id number] DOB/AGE: Jun 02, 1946 76 y.o.  Admit date: 05/27/2022 Discharge date: 05/28/2022  Admission Diagnoses:  Principal Problem:   OA (osteoarthritis) of hip Active Problems:   Primary osteoarthritis of left hip   Discharge Diagnoses:  Same  Past Medical History:  Diagnosis Date   Allergy    Anginal pain ([place])    Arthritis    Asthma    Benign prostatic hypertrophy    Coronary artery disease    Diverticulosis    Dyspnea    GERD (gastroesophageal reflux disease)    History of kidney stones    Hyperlipidemia    Hypertension    Nephrolithiasis    Pleural effusion on right    Pneumonia    PVC's (premature ventricular contractions)    RECTAL BLEEDING 09/15/2010   Qualifier: Diagnosis of  By: Trellis Paganini PA-c, Amy S    Shingles    twice   Syncope     Surgeries: Procedure(s): TOTAL HIP ARTHROPLASTY ANTERIOR APPROACH on 05/27/2022   Consultants:   Discharged Condition: Improved  Hospital Course: Antonio Carlson is an 76 y.o. male who was admitted 05/27/2022 for operative treatment ofOA (osteoarthritis) of hip. Patient has severe unremitting pain that affects sleep, daily activities, and work/hobbies. After pre-op clearance the patient was taken to the operating room on 05/27/2022 and underwent  Procedure(s): TOTAL HIP ARTHROPLASTY ANTERIOR APPROACH.    Patient was given perioperative antibiotics:  Anti-infectives (From admission, onward)    Start     Dose/Rate Route Frequency Ordered Stop   05/27/22 2000  ceFAZolin (ANCEF) IVPB 2g/100 mL premix        2 g 200 mL/hr over 30 Minutes Intravenous Every 6 hours 05/27/22 1812 05/28/22 0222   05/27/22 1215  ceFAZolin (ANCEF) IVPB 2g/100 mL premix        2 g 200 mL/hr over 30 Minutes Intravenous On call to O.R. 05/27/22 1201 05/27/22 1413        Patient was given sequential compression devices, early ambulation, and chemoprophylaxis to prevent DVT.  Patient benefited maximally from hospital  stay and there were no complications.    Recent vital signs: No data found.   Recent laboratory studies:  Recent Labs    05/28/22 0345  WBC 19.9*  HGB 13.4  HCT 41.0  PLT 319  NA 136  K 3.9  CL 102  CO2 24  BUN 19  CREATININE 0.92  GLUCOSE 134*  CALCIUM 8.8*     Discharge Medications:   Allergies as of 05/28/2022       Reactions   Aspirin Shortness Of Breath   Amoxicillin    GI upset Did it involve swelling of the face/tongue/throat, SOB, or low BP? No Did it involve sudden or severe rash/hives, skin peeling, or any reaction on the inside of your mouth or nose? No Did you need to seek medical attention at a hospital or doctor's office? No When did it last happen?    20+ years   If all above answers are "NO", may proceed with cephalosporin use.   Sulfonamide Derivatives Other (See Comments)   Doesn't remember        Medication List     STOP taking these medications    CALCIUM CITRATE-VITAMIN D PO   Centrum tablet   clopidogrel 75 MG tablet Commonly known as: PLAVIX       TAKE these medications    acetaminophen 500 MG tablet Commonly known as: TYLENOL Take 500-1,000 mg by mouth every  6 (six) hours as needed for moderate pain or headache.   atorvastatin 80 MG tablet Commonly known as: LIPITOR TAKE 1 TABLET (80 MG TOTAL) BY MOUTH DAILY AT 6 PM   budesonide 0.25 MG/2ML nebulizer solution Commonly known as: PULMICORT USE 1 VIAL IN NEBULIZER TWICE DAILY What changed:  how much to take how to take this when to take this additional instructions   ezetimibe 10 MG tablet Commonly known as: ZETIA Take 1 tablet (10 mg total) by mouth daily.   formoterol 20 MCG/2ML nebulizer solution Commonly known as: PERFOROMIST Take 2 mLs by nebulization 2 (two) times daily.   HYDROcodone-acetaminophen 5-325 MG tablet Commonly known as: NORCO/VICODIN Take 1-2 tablets by mouth every 6 (six) hours as needed for severe pain.   loratadine 10 MG tablet Commonly  known as: CLARITIN Take 10 mg by mouth daily as needed for allergies.   methocarbamol 500 MG tablet Commonly known as: ROBAXIN Take 1 tablet (500 mg total) by mouth every 6 (six) hours as needed for muscle spasms.   metoprolol tartrate 50 MG tablet Commonly known as: LOPRESSOR Take 0.5 tablets (25 mg total) by mouth 2 (two) times daily.   montelukast 10 MG tablet Commonly known as: SINGULAIR Take 1 tablet (10 mg total) by mouth at bedtime.   pantoprazole 40 MG tablet Commonly known as: PROTONIX TAKE 1 TABLET BY MOUTH EVERY DAY   rivaroxaban 10 MG Tabs tablet Commonly known as: XARELTO Take 1 tablet (10 mg total) by mouth daily with breakfast for 20 days. Then resume one 75 mg Plavix once a day.   telmisartan 80 MG tablet Commonly known as: Micardis Take 1 tablet (80 mg total) by mouth daily.   traMADol 50 MG tablet Commonly known as: ULTRAM Take 1-2 tablets (50-100 mg total) by mouth every 6 (six) hours as needed for moderate pain.               Discharge Care Instructions  (From admission, onward)           Start     Ordered   05/28/22 0000  Weight bearing as tolerated        05/28/22 0758   05/28/22 0000  Change dressing       Comments: You have an adhesive waterproof bandage over the incision. Leave this in place until your first follow-up appointment. Once you remove this you will not need to place another bandage.   05/28/22 0758            Diagnostic Studies: DG Pelvis Portable  Result Date: 05/27/2022 CLINICAL DATA:  Left hip replacement. EXAM: PORTABLE PELVIS 1-2 VIEWS COMPARISON:  None Available. FINDINGS: The left hip demonstrates a total arthroplasty without evidence of hardware failure or complication. There is no fracture or dislocation. The alignment is anatomic. Post-surgical changes noted in the surrounding soft tissues. IMPRESSION: 1. Left total hip arthroplasty without acute postoperative complication. Electronically Signed   By: Titus Dubin M.D.   On: 05/27/2022 16:27   DG HIP UNILAT WITH PELVIS 1V LEFT  Result Date: 05/27/2022 CLINICAL DATA:  Left hip surgery EXAM: DG HIP (WITH OR WITHOUT PELVIS) 1V*L* COMPARISON:  07/14/2019 FINDINGS: Six C-arm fluoroscopic images were obtained intraoperatively and submitted for post operative interpretation. Images demonstrate placement of left total hip arthroplasty hardware. 5 seconds of fluoroscopy time utilized. Radiation dose: 0.78 mGy. Please see the performing provider's procedural report for further detail. IMPRESSION: Intraoperative fluoroscopic guidance for left total hip arthroplasty. Electronically Signed  By: Davina Poke D.O.   On: 05/27/2022 15:23   DG C-Arm 1-60 Min-No Report  Result Date: 05/27/2022 Fluoroscopy was utilized by the requesting physician.  No radiographic interpretation.   DG C-Arm 1-60 Min-No Report  Result Date: 05/27/2022 Fluoroscopy was utilized by the requesting physician.  No radiographic interpretation.    Disposition: Discharge disposition: 01-Home or Self Care       Discharge Instructions     Call MD / Call 911   Complete by: As directed    If you experience chest pain or shortness of breath, CALL 911 and be transported to the hospital emergency room.  If you develope a fever above 101 F, pus (white drainage) or increased drainage or redness at the wound, or calf pain, call your surgeon's office.   Change dressing   Complete by: As directed    You have an adhesive waterproof bandage over the incision. Leave this in place until your first follow-up appointment. Once you remove this you will not need to place another bandage.   Constipation Prevention   Complete by: As directed    Drink plenty of fluids.  Prune juice may be helpful.  You may use a stool softener, such as Colace (over the counter) 100 mg twice a day.  Use MiraLax (over the counter) for constipation as needed.   Diet - low sodium heart healthy   Complete by: As  directed    Do not sit on low chairs, stoools or toilet seats, as it may be difficult to get up from low surfaces   Complete by: As directed    Driving restrictions   Complete by: As directed    No driving for two weeks   Post-operative opioid taper instructions:   Complete by: As directed    POST-OPERATIVE OPIOID TAPER INSTRUCTIONS: It is important to wean off of your opioid medication as soon as possible. If you do not need pain medication after your surgery it is ok to stop day one. Opioids include: Codeine, Hydrocodone(Norco, Vicodin), Oxycodone(Percocet, oxycontin) and hydromorphone amongst others.  Long term and even short term use of opiods can cause: Increased pain response Dependence Constipation Depression Respiratory depression And more.  Withdrawal symptoms can include Flu like symptoms Nausea, vomiting And more Techniques to manage these symptoms Hydrate well Eat regular healthy meals Stay active Use relaxation techniques(deep breathing, meditating, yoga) Do Not substitute Alcohol to help with tapering If you have been on opioids for less than two weeks and do not have pain than it is ok to stop all together.  Plan to wean off of opioids This plan should start within one week post op of your joint replacement. Maintain the same interval or time between taking each dose and first decrease the dose.  Cut the total daily intake of opioids by one tablet each day Next start to increase the time between doses. The last dose that should be eliminated is the evening dose.      TED hose   Complete by: As directed    Use stockings (TED hose) for three weeks on both leg(s).  You may remove them at night for sleeping.   Weight bearing as tolerated   Complete by: As directed         Follow-up Information     Aluisio, Pilar Plate, MD. Schedule an appointment as soon as possible for a visit in 2 week(s).   Specialty: Orthopedic Surgery Contact information: [address] [place] [address]  [phone]                  Signed: Theresa Duty 05/29/2022, 9:52 AM

## 2022-06-08 ENCOUNTER — Encounter: Payer: Self-pay | Admitting: Internal Medicine

## 2022-06-08 ENCOUNTER — Ambulatory Visit (INDEPENDENT_AMBULATORY_CARE_PROVIDER_SITE_OTHER): Payer: Medicare Other | Admitting: Internal Medicine

## 2022-06-08 VITALS — BP 142/82 | HR 87 | Resp 16 | Ht 68.0 in | Wt 173.1 lb

## 2022-06-08 DIAGNOSIS — M792 Neuralgia and neuritis, unspecified: Secondary | ICD-10-CM | POA: Diagnosis not present

## 2022-06-08 DIAGNOSIS — M545 Low back pain, unspecified: Secondary | ICD-10-CM

## 2022-06-08 NOTE — Progress Notes (Unsigned)
     CC: Rash (Has a few red bumps on his upper and lower back, some are sore and was concerned for shingles since just had surgery)    HPI:Mr.Antonio Carlson is a 76 y.o. male who presents for evaluation of rash. For details of today's visit, please see assessment and plan.    Past Medical History:  Diagnosis Date   Allergy    Anginal pain ([place])    Arthritis    Asthma    Benign prostatic hypertrophy    Coronary artery disease    Diverticulosis    Dyspnea    GERD (gastroesophageal reflux disease)    History of kidney stones    Hyperlipidemia    Hypertension    Nephrolithiasis    Pleural effusion on right    Pneumonia    PVC's (premature ventricular contractions)    RECTAL BLEEDING 09/15/2010   Qualifier: Diagnosis of  By: Shane Crutch, Amy S    Shingles    twice   Syncope      Physical Exam: Vitals:   06/08/22 1549  BP: (!) 169/78  Pulse: 87  Resp: 16  SpO2: 97%  Weight: 173 lb 1.9 oz (78.5 kg)  Height: '5\' 8"'$  (1.727 m)     Physical Exam Constitutional:      General: He is not in acute distress.    Appearance: He is not ill-appearing.  Cardiovascular:     Rate and Rhythm: Normal rate and regular rhythm.  Musculoskeletal:     Cervical back: Neck supple. No rigidity.    Picture above shows small puncture wound centerline at level of iliac crest ( this is area of pain)  Skin: No rash, erythema , or fluctuant  Assessment & Plan:   Back pain Patient has   Neuralgia Patient has a history of shingles and is having a similar stinging pain in his back. He thought this might be the beginning of shingles outbreak. He has been vaccinated for shingles. Patient underwent total hip arthroplasty 2 weeks ago and had spinal anesthesia. No neck stiffness, fever, chill, weakness, or sensory changes.    Assessment/Plan: Neuralgia, patient is having a sharp pain at the sight of his spinal injection. I do not believe this is shingles and recommended against treating for  shingles. I am not sure why this is noticeable 2 weeks after procedure. Exam does not show signs of infection. He has pain medications from hip surgery, will not prescribe additional medication at this time. Patient given return precautions if pain doesn't improve.      Lorene Dy, MD

## 2022-06-08 NOTE — Patient Instructions (Signed)
Thank you for trusting me with your care. To recap, today we discussed the following:   I believe this is irritation from your spinal tap. Follow up if symptoms change or don't improve.

## 2022-06-09 ENCOUNTER — Encounter: Payer: Self-pay | Admitting: Internal Medicine

## 2022-06-09 DIAGNOSIS — M792 Neuralgia and neuritis, unspecified: Secondary | ICD-10-CM | POA: Insufficient documentation

## 2022-06-09 DIAGNOSIS — M549 Dorsalgia, unspecified: Secondary | ICD-10-CM | POA: Insufficient documentation

## 2022-06-09 NOTE — Assessment & Plan Note (Addendum)
Patient has a history of shingles and is having a similar stinging pain in his back. He thought this might be the beginning of shingles outbreak. He has been vaccinated for shingles. Patient underwent total hip arthroplasty 2 weeks ago and had spinal anesthesia. No neck stiffness, fever, chill, weakness, or sensory changes.    Assessment/Plan: Neuralgia, patient is having a sharp pain at the sight of his spinal injection. I do not believe this is shingles and recommended against treating for shingles. I am not sure why this is noticeable 2 weeks after procedure. Exam does not show signs of infection. He has pain medications from hip surgery, will not prescribe additional medication at this time. Patient given return precautions if pain doesn't improve.

## 2022-06-09 NOTE — Assessment & Plan Note (Signed)
Patient has

## 2022-06-11 ENCOUNTER — Other Ambulatory Visit (HOSPITAL_COMMUNITY): Payer: Medicare Other

## 2022-07-06 ENCOUNTER — Other Ambulatory Visit: Payer: Self-pay | Admitting: Internal Medicine

## 2022-07-07 ENCOUNTER — Other Ambulatory Visit: Payer: Self-pay | Admitting: Internal Medicine

## 2022-07-07 DIAGNOSIS — I10 Essential (primary) hypertension: Secondary | ICD-10-CM

## 2022-07-08 DIAGNOSIS — Z5189 Encounter for other specified aftercare: Secondary | ICD-10-CM | POA: Diagnosis not present

## 2022-07-30 ENCOUNTER — Other Ambulatory Visit: Payer: Self-pay | Admitting: Internal Medicine

## 2022-08-03 ENCOUNTER — Encounter: Payer: Self-pay | Admitting: Internal Medicine

## 2022-08-03 ENCOUNTER — Ambulatory Visit (INDEPENDENT_AMBULATORY_CARE_PROVIDER_SITE_OTHER): Payer: Medicare Other | Admitting: Internal Medicine

## 2022-08-03 VITALS — BP 162/74 | HR 71 | Ht 68.0 in | Wt 183.2 lb

## 2022-08-03 DIAGNOSIS — L600 Ingrowing nail: Secondary | ICD-10-CM | POA: Insufficient documentation

## 2022-08-03 DIAGNOSIS — I10 Essential (primary) hypertension: Secondary | ICD-10-CM

## 2022-08-03 DIAGNOSIS — E782 Mixed hyperlipidemia: Secondary | ICD-10-CM | POA: Diagnosis not present

## 2022-08-03 DIAGNOSIS — I25118 Atherosclerotic heart disease of native coronary artery with other forms of angina pectoris: Secondary | ICD-10-CM

## 2022-08-03 DIAGNOSIS — J0100 Acute maxillary sinusitis, unspecified: Secondary | ICD-10-CM

## 2022-08-03 DIAGNOSIS — M1610 Unilateral primary osteoarthritis, unspecified hip: Secondary | ICD-10-CM

## 2022-08-03 MED ORDER — AZITHROMYCIN 250 MG PO TABS: ORAL_TABLET | ORAL | 0 refills | Status: AC

## 2022-08-03 MED ORDER — AMLODIPINE BESYLATE 5 MG PO TABS: 5.0000 mg | ORAL_TABLET | Freq: Every day | ORAL | 0 refills | Status: DC

## 2022-08-03 MED ORDER — NOREL AD 4-10-325 MG PO TABS: 1.0000 | ORAL_TABLET | Freq: Three times a day (TID) | ORAL | 3 refills | Status: DC | PRN

## 2022-08-03 NOTE — Progress Notes (Unsigned)
Established Patient Office Visit  Subjective:  Patient ID: Antonio Carlson, male    DOB: [birth year]  Age: 77 y.o. MRN: [id number]  CC:  Chief Complaint  Patient presents with   Nasal Congestion    Patient is having a head cold, having trouble breathing at times, stopped up in his head especially at night. Patient is also having pain in big toe after wearing shoes for a period of time    HPI Antonio Carlson is a 77 y.o. male with past medical history of HTN, CAD s/p CABG, asthma, allergic rhinitis and OA of hip who presents for f/u of his chronic medical conditions.  HTN: BP is elevated. Takes medications regularly. Patient denies dizziness, chest pain, dyspnea or palpitations.  CAD s/p CABG: He is on Plavix and statin currently.  He also takes metoprolol for it.  He denies any anginal chest pain or dyspnea currently.  He has history of asthma and allergic rhinitis, for which he uses Perforomist and Pulmicort neb.  He denies any dyspnea or wheezing currently.  He follows up with Dr Melvyn Novas.  He has tried Dupixent, but had an allergic reaction to it.  He has history of OA of hip, s/p left THA, for which he sees Dr. Maureen Ralphs. He is undergoing home PT.     Past Medical History:  Diagnosis Date   Allergy    Anginal pain ([place])    Arthritis    Asthma    Benign prostatic hypertrophy    Coronary artery disease    Diverticulosis    Dyspnea    GERD (gastroesophageal reflux disease)    History of kidney stones    Hyperlipidemia    Hypertension    Nephrolithiasis    Pleural effusion on right    Pneumonia    PVC's (premature ventricular contractions)    RECTAL BLEEDING 09/15/2010   Qualifier: Diagnosis of  By: Trellis Paganini PA-c, Amy S    Shingles    twice   Syncope     Past Surgical History:  Procedure Laterality Date   APPENDECTOMY     CORONARY ARTERY BYPASS GRAFT N/A 05/01/2019   Procedure: CORONARY ARTERY BYPASS GRAFTING (CABG) TIMES FOUR USING LEFT AND RIGHT INTERNAL MAMMARY  ARTERIES AND RIGHT RADIAL ARTERY WITH STERNAL PLATING;  Surgeon: [name], MD;  Location: [facility];  Service: Open Heart Surgery;  Laterality: N/A;   EYE SURGERY Bilateral    catartact surgery   LEFT HEART CATH AND CORONARY ANGIOGRAPHY N/A 04/18/2019   Procedure: LEFT HEART CATH AND CORONARY ANGIOGRAPHY;  Surgeon: Belva Crome, MD;  Location: [facility];  Service: Cardiovascular;  Laterality: N/A;   NASAL SINUS SURGERY  03/28/2011   RADIAL ARTERY HARVEST Right 05/01/2019   Procedure: Right Radial Artery Harvest;  Surgeon: [name], MD;  Location: [facility];  Service: Open Heart Surgery;  Laterality: Right;   TEE WITHOUT CARDIOVERSION N/A 05/01/2019   Procedure: TRANSESOPHAGEAL ECHOCARDIOGRAM (TEE);  Surgeon: [name], MD;  Location: [facility];  Service: Open Heart Surgery;  Laterality: N/A;   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   Procedure: TOTAL HIP ARTHROPLASTY ANTERIOR APPROACH;  Surgeon: Gaynelle Arabian, MD;  Location: [facility];  Service: Orthopedics;  Laterality: Left;    Family History  Problem Relation Age of Onset   Heart attack Father     Social History   Socioeconomic History   Marital status: Married    Spouse name: Not on file   Number of children:  Not on file   Years of education: Not on file   Highest education level: Not on file  Occupational History   Not on file  Tobacco Use   Smoking status: Former    Packs/day: 2.00    Years: 14.00    Total pack years: 28.00    Types: Cigarettes    Quit date: 07/27/1969    Years since quitting: 53.0   Smokeless tobacco: Never  Vaping Use   Vaping Use: Never used  Substance and Sexual Activity   Alcohol use: Yes    Comment: beer occ   Drug use: Never   Sexual activity: Yes  Other Topics Concern   Not on file  Social History Narrative   Not on file   Social Determinants of Health   Financial Resource Strain: Low Risk  (01/07/2022)   Overall Financial Resource Strain (CARDIA)    Difficulty of  Paying Living Expenses: Not hard at all  Food Insecurity: No Food Insecurity (05/27/2022)   Hunger Vital Sign    Worried About Running Out of Food in the Last Year: Never true    Ran Out of Food in the Last Year: Never true  Transportation Needs: No Transportation Needs (05/27/2022)   PRAPARE - Hydrologist (Medical): No    Lack of Transportation (Non-Medical): No  Physical Activity: Sufficiently Active (01/07/2022)   Exercise Vital Sign    Days of Exercise per Week: 7 days    Minutes of Exercise per Session: 30 min  Stress: No Stress Concern Present (01/07/2022)   [place]    Feeling of Stress : Not at all  Social Connections: Moderately Integrated (01/07/2022)   Social Connection and Isolation Panel [NHANES]    Frequency of Communication with Friends and Family: More than three times a week    Frequency of Social Gatherings with Friends and Family: More than three times a week    Attends Religious Services: More than 4 times per year    Active Member of [employer] or Organizations: No    Attends Archivist Meetings: Never    Marital Status: Married  Human resources officer Violence: Not At Risk (05/27/2022)   Humiliation, Afraid, Rape, and Kick questionnaire    Fear of Current or Ex-Partner: No    Emotionally Abused: No    Physically Abused: No    Sexually Abused: No    Outpatient Medications Prior to Visit  Medication Sig Dispense Refill   clopidogrel (PLAVIX) 75 MG tablet Take 75 mg by mouth daily.     acetaminophen (TYLENOL) 500 MG tablet Take 500-1,000 mg by mouth every 6 (six) hours as needed for moderate pain or headache.     atorvastatin (LIPITOR) 80 MG tablet TAKE 1 TABLET BY MOUTH DAILY AT 6 PM. 90 tablet 0   budesonide (PULMICORT) 0.25 MG/2ML nebulizer solution USE 1 VIAL IN NEBULIZER TWICE DAILY (Patient taking differently: Take 0.25 mg by nebulization 2 (two) times daily.) 120 mL  11   ezetimibe (ZETIA) 10 MG tablet Take 1 tablet (10 mg total) by mouth daily. 90 tablet 3   formoterol (PERFOROMIST) 20 MCG/2ML nebulizer solution TAKE 2 MLS BY NEBULIZATION TWICE DAILY 360 mL 11   metoprolol tartrate (LOPRESSOR) 50 MG tablet TAKE 0.5 TABLETS BY MOUTH 2 TIMES DAILY. 90 tablet 0   montelukast (SINGULAIR) 10 MG tablet Take 1 tablet (10 mg total) by mouth at bedtime. 90 tablet 4   pantoprazole (PROTONIX)  40 MG tablet TAKE 1 TABLET BY MOUTH EVERY DAY 90 tablet 2   telmisartan (MICARDIS) 80 MG tablet TAKE 1 TABLET BY MOUTH EVERY DAY 90 tablet 3   HYDROcodone-acetaminophen (NORCO/VICODIN) 5-325 MG tablet Take 1-2 tablets by mouth every 6 (six) hours as needed for severe pain. (Patient not taking: Reported on 06/08/2022) 42 tablet 0   loratadine (CLARITIN) 10 MG tablet Take 10 mg by mouth daily as needed for allergies.     methocarbamol (ROBAXIN) 500 MG tablet Take 1 tablet (500 mg total) by mouth every 6 (six) hours as needed for muscle spasms. 40 tablet 0   rivaroxaban (XARELTO) 10 MG TABS tablet TAKE 1 TABLET BY MOUTH DAILY WITH BREAKFAST FOR 20 DAYS. THEN RESUME ONE 75 MG PLAVIX ONCE A DAY     traMADol (ULTRAM) 50 MG tablet Take 1-2 tablets (50-100 mg total) by mouth every 6 (six) hours as needed for moderate pain. 40 tablet 0   No facility-administered medications prior to visit.    Allergies  Allergen Reactions   Aspirin Shortness Of Breath   Amoxicillin     GI upset Did it involve swelling of the face/tongue/throat, SOB, or low BP? No Did it involve sudden or severe rash/hives, skin peeling, or any reaction on the inside of your mouth or nose? No Did you need to seek medical attention at a hospital or doctor's office? No When did it last happen?    20+ years   If all above answers are "NO", may proceed with cephalosporin use.    Sulfonamide Derivatives Other (See Comments)    Doesn't remember    ROS Review of Systems    Objective:    Physical Exam  BP (!)  162/74 (BP Location: Left Arm, Cuff Size: Normal)   Pulse 71   Ht '5\' 8"'$  (1.727 m)   Wt 183 lb 3.2 oz (83.1 kg)   SpO2 96%   BMI 27.86 kg/m  Wt Readings from Last 3 Encounters:  08/03/22 183 lb 3.2 oz (83.1 kg)  06/08/22 173 lb 1.9 oz (78.5 kg)  05/27/22 174 lb 2.6 oz (79 kg)    Lab Results  Component Value Date   TSH 1.17 07/01/2021   Lab Results  Component Value Date   WBC 19.9 (H) 05/28/2022   HGB 13.4 05/28/2022   HCT 41.0 05/28/2022   MCV 86.5 05/28/2022   PLT 319 05/28/2022   Lab Results  Component Value Date   NA 136 05/28/2022   K 3.9 05/28/2022   CO2 24 05/28/2022   GLUCOSE 134 (H) 05/28/2022   BUN 19 05/28/2022   CREATININE 0.92 05/28/2022   BILITOT 0.4 03/24/2022   ALKPHOS 136 (H) 03/24/2022   AST 28 03/24/2022   ALT 25 03/24/2022   PROT 6.9 03/24/2022   ALBUMIN 4.4 03/24/2022   CALCIUM 8.8 (L) 05/28/2022   ANIONGAP 10 05/28/2022   EGFR 79 03/24/2022   GFR 78.99 07/01/2021   Lab Results  Component Value Date   CHOL 178 03/24/2022   Lab Results  Component Value Date   HDL 48 03/24/2022   Lab Results  Component Value Date   LDLCALC 112 (H) 03/24/2022   Lab Results  Component Value Date   TRIG 98 03/24/2022   Lab Results  Component Value Date   CHOLHDL 3.7 03/24/2022   Lab Results  Component Value Date   HGBA1C 5.8 (H) 04/27/2019      Assessment & Plan:   Problem List Items Addressed This Visit  Cardiovascular and Mediastinum   Essential hypertension - Primary (Chronic)   Relevant Medications   amLODipine (NORVASC) 5 MG tablet   Coronary artery disease (Chronic)   Relevant Medications   amLODipine (NORVASC) 5 MG tablet     Respiratory   Acute non-recurrent maxillary sinusitis   Relevant Medications   azithromycin (ZITHROMAX) 250 MG tablet   Chlorphen-PE-Acetaminophen (NOREL AD) 4-10-325 MG TABS     Musculoskeletal and Integument   Hip arthritis   Ingrown toenail of left foot   Relevant Orders   Ambulatory  referral to Podiatry     Other   Hyperlipidemia   Relevant Medications   amLODipine (NORVASC) 5 MG tablet    Meds ordered this encounter  Medications   azithromycin (ZITHROMAX) 250 MG tablet    Sig: Take 2 tablets on day 1, then 1 tablet daily on days 2 through 5    Dispense:  6 tablet    Refill:  0   Chlorphen-PE-Acetaminophen (NOREL AD) 4-10-325 MG TABS    Sig: Take 1 tablet by mouth 3 (three) times daily as needed (Nasal congestion).    Dispense:  30 tablet    Refill:  3   amLODipine (NORVASC) 5 MG tablet    Sig: Take 1 tablet (5 mg total) by mouth daily.    Dispense:  30 tablet    Refill:  0    Follow-up: Return in about 4 weeks (around 08/31/2022) for HTN.    Lindell Spar, MD

## 2022-08-03 NOTE — Patient Instructions (Addendum)
Please start taking Amlodipine as prescribed.  Please take Azithromycin and Norel AD for sinusitis.  Please continue taking other medications as prescribed.  Please continue to follow low salt diet and ambulate as tolerated.

## 2022-08-04 NOTE — Assessment & Plan Note (Signed)
BP Readings from Last 1 Encounters:  08/03/22 (!) 162/74   Uncontrolled with Telmisartan 80 mg QD and Metoprolol 50 mg QD Added Amlodipine 5 mg QD Counseled for compliance with the medications Advised DASH diet and moderate exercise/walking as tolerated

## 2022-08-04 NOTE — Assessment & Plan Note (Signed)
Left foot pain likely due to ingrown toenail, also has concern for onychomycosis Referred to podiatry

## 2022-08-04 NOTE — Assessment & Plan Note (Signed)
Tylenol as needed Followed by Orthopedic surgery - s/p THA

## 2022-08-04 NOTE — Assessment & Plan Note (Addendum)
Has persistent nasal congestion despite symptomatic treatment Started empiric azithromycin Norel AD as needed for nasal congestion Continue Singulair for allergies

## 2022-08-04 NOTE — Assessment & Plan Note (Signed)
On statin and Zetia. 

## 2022-08-04 NOTE — Assessment & Plan Note (Signed)
Status post CABG On Plavix and metoprolol Denies any anginal pain or dyspnea currently Followed by cardiology

## 2022-08-06 ENCOUNTER — Other Ambulatory Visit: Payer: Self-pay | Admitting: Internal Medicine

## 2022-08-08 ENCOUNTER — Other Ambulatory Visit: Payer: Self-pay | Admitting: Internal Medicine

## 2022-08-12 ENCOUNTER — Ambulatory Visit (INDEPENDENT_AMBULATORY_CARE_PROVIDER_SITE_OTHER): Payer: Medicare Other | Admitting: Podiatry

## 2022-08-12 ENCOUNTER — Encounter: Payer: Self-pay | Admitting: Podiatry

## 2022-08-12 DIAGNOSIS — M79675 Pain in left toe(s): Secondary | ICD-10-CM

## 2022-08-12 DIAGNOSIS — B351 Tinea unguium: Secondary | ICD-10-CM | POA: Diagnosis not present

## 2022-08-12 DIAGNOSIS — M79674 Pain in right toe(s): Secondary | ICD-10-CM | POA: Diagnosis not present

## 2022-08-12 NOTE — Progress Notes (Signed)
  Subjective:  Patient ID: Antonio Carlson, male    DOB: 05-02-1946,  MRN: [id number]  Chief Complaint  Patient presents with   Nail Problem    Ingrown toenail of left foot Referring Provider: Lindell Spar    77 y.o. male presents with the above complaint. History confirmed with patient.  All his nails are thick and elongated causing pain and discomfort.  Pressure issues is uncomfortable.  Feels the corners taken and cause pain  Objective:  Physical Exam: warm, good capillary refill, no trophic changes or ulcerative lesions, normal DP and PT pulses, and normal sensory exam. Left Foot: dystrophic yellowed discolored nail plates with subungual debris Right Foot: dystrophic yellowed discolored nail plates with subungual debris  Assessment:   1. Pain due to onychomycosis of toenails of both feet      Plan:  Patient was evaluated and treated and all questions answered.  Discussed the etiology and treatment options for the condition in detail with the patient. Educated patient on the topical and oral treatment options for mycotic nails. Recommended debridement of the nails today. Sharp and mechanical debridement performed of all painful and mycotic nails today. Nails debrided in length and thickness using a nail nipper to level of comfort. Discussed treatment options including appropriate shoe gear. Follow up as needed for painful nails.    Return in about 3 months (around 11/11/2022) for painful thick fungal nails.

## 2022-08-25 ENCOUNTER — Other Ambulatory Visit: Payer: Self-pay | Admitting: Internal Medicine

## 2022-08-25 DIAGNOSIS — I10 Essential (primary) hypertension: Secondary | ICD-10-CM

## 2022-09-01 ENCOUNTER — Ambulatory Visit (INDEPENDENT_AMBULATORY_CARE_PROVIDER_SITE_OTHER): Payer: Medicare Other | Admitting: Family Medicine

## 2022-09-01 ENCOUNTER — Ambulatory Visit: Payer: Medicare Other | Admitting: Internal Medicine

## 2022-09-01 ENCOUNTER — Encounter: Payer: Self-pay | Admitting: Family Medicine

## 2022-09-01 VITALS — BP 110/67 | HR 102 | Ht 68.0 in | Wt 184.0 lb

## 2022-09-01 DIAGNOSIS — R0981 Nasal congestion: Secondary | ICD-10-CM

## 2022-09-01 DIAGNOSIS — T733XXA Exhaustion due to excessive exertion, initial encounter: Secondary | ICD-10-CM | POA: Diagnosis not present

## 2022-09-01 DIAGNOSIS — M25512 Pain in left shoulder: Secondary | ICD-10-CM | POA: Diagnosis not present

## 2022-09-01 DIAGNOSIS — I10 Essential (primary) hypertension: Secondary | ICD-10-CM | POA: Diagnosis not present

## 2022-09-01 DIAGNOSIS — I2510 Atherosclerotic heart disease of native coronary artery without angina pectoris: Secondary | ICD-10-CM

## 2022-09-01 DIAGNOSIS — R21 Rash and other nonspecific skin eruption: Secondary | ICD-10-CM | POA: Diagnosis not present

## 2022-09-01 MED ORDER — AZELASTINE HCL 0.1 % NA SOLN: 2.0000 | Freq: Two times a day (BID) | NASAL | 0 refills | Status: DC

## 2022-09-01 NOTE — Patient Instructions (Addendum)
F/U with Dr Posey Pronto in 4 to 8 weeks, call if you need to be seen sooner  Please schedule f/u appointment with Dr Melvyn Novas at [place] are refeerred to Orthopedics re left shoulder  You are referred to Dr Debara Pickett for f/u CAD with new exertional fatigue  Astelin nasal spray is prescribed for nasal congestion and allergy symptoms  Blood pressure is excellent at visit  Use OTC hydrocortisone cream twice daily for scaly rash on right leg, this is being treated as eczema, if persists will need to see dermatology  Thanks for choosing [facility], we consider it a privelige to serve you.

## 2022-09-01 NOTE — Assessment & Plan Note (Signed)
2 week history unprovoked refer Ortho, bursitris

## 2022-09-02 DIAGNOSIS — M7542 Impingement syndrome of left shoulder: Secondary | ICD-10-CM | POA: Diagnosis not present

## 2022-09-02 DIAGNOSIS — M25512 Pain in left shoulder: Secondary | ICD-10-CM | POA: Diagnosis not present

## 2022-09-04 ENCOUNTER — Ambulatory Visit: Payer: Medicare Other | Attending: Nurse Practitioner | Admitting: Nurse Practitioner

## 2022-09-04 ENCOUNTER — Encounter: Payer: Self-pay | Admitting: Nurse Practitioner

## 2022-09-04 VITALS — BP 134/68 | HR 68 | Ht 68.0 in | Wt 188.6 lb

## 2022-09-04 DIAGNOSIS — I2510 Atherosclerotic heart disease of native coronary artery without angina pectoris: Secondary | ICD-10-CM | POA: Diagnosis not present

## 2022-09-04 DIAGNOSIS — I10 Essential (primary) hypertension: Secondary | ICD-10-CM

## 2022-09-04 DIAGNOSIS — E785 Hyperlipidemia, unspecified: Secondary | ICD-10-CM | POA: Diagnosis not present

## 2022-09-04 DIAGNOSIS — R0602 Shortness of breath: Secondary | ICD-10-CM

## 2022-09-04 DIAGNOSIS — I480 Paroxysmal atrial fibrillation: Secondary | ICD-10-CM

## 2022-09-04 DIAGNOSIS — R600 Localized edema: Secondary | ICD-10-CM | POA: Diagnosis not present

## 2022-09-04 NOTE — Progress Notes (Signed)
Office Visit    Patient Name: Antonio Carlson Date of Encounter: 09/04/2022  Primary Care Provider:  Lindell Spar, MD Primary Cardiologist:  Pixie Casino, MD  Chief Complaint    77 year old male with a history of CAD s/p CABG x 4 in 08/2018, postop atrial fibrillation, hypertension, hyperlipidemia, osteopenia, GERD, BPH and asthma who presents for follow-up related to CAD, shortness of breath and lower extremity edema.  Past Medical History    Past Medical History:  Diagnosis Date   Allergy    Anginal pain ([place])    Arthritis    Asthma    Benign prostatic hypertrophy    Coronary artery disease    Diverticulosis    Dyspnea    GERD (gastroesophageal reflux disease)    History of kidney stones    Hyperlipidemia    Hypertension    Nephrolithiasis    Pleural effusion on right    Pneumonia    PVC's (premature ventricular contractions)    RECTAL BLEEDING 09/15/2010   Qualifier: Diagnosis of  By: Trellis Paganini PA-c, Amy S    Shingles    twice   Syncope    Past Surgical History:  Procedure Laterality Date   APPENDECTOMY     CORONARY ARTERY BYPASS GRAFT N/A 05/01/2019   Procedure: CORONARY ARTERY BYPASS GRAFTING (CABG) TIMES FOUR USING LEFT AND RIGHT INTERNAL MAMMARY ARTERIES AND RIGHT RADIAL ARTERY WITH STERNAL PLATING;  Surgeon: [name], MD;  Location: [place];  Service: Open Heart Surgery;  Laterality: N/A;   EYE SURGERY Bilateral    catartact surgery   LEFT HEART CATH AND CORONARY ANGIOGRAPHY N/A 04/18/2019   Procedure: LEFT HEART CATH AND CORONARY ANGIOGRAPHY;  Surgeon: Belva Crome, MD;  Location: [place] [facility];  Service: Cardiovascular;  Laterality: N/A;   NASAL SINUS SURGERY  03/28/2011   RADIAL ARTERY HARVEST Right 05/01/2019   Procedure: Right Radial Artery Harvest;  Surgeon: [name], MD;  Location: [facility];  Service: Open Heart Surgery;  Laterality: Right;   TEE WITHOUT CARDIOVERSION N/A 05/01/2019   Procedure: TRANSESOPHAGEAL ECHOCARDIOGRAM  (TEE);  Surgeon: [name], MD;  Location: [place];  Service: Open Heart Surgery;  Laterality: N/A;   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   Procedure: TOTAL HIP ARTHROPLASTY ANTERIOR APPROACH;  Surgeon: Gaynelle Arabian, MD;  Location: [facility];  Service: Orthopedics;  Laterality: Left;    Allergies  Allergies  Allergen Reactions   Aspirin Shortness Of Breath   Amoxicillin     GI upset Did it involve swelling of the face/tongue/throat, SOB, or low BP? No Did it involve sudden or severe rash/hives, skin peeling, or any reaction on the inside of your mouth or nose? No Did you need to seek medical attention at a hospital or doctor's office? No When did it last happen?    20+ years   If all above answers are "NO", may proceed with cephalosporin use.    Sulfonamide Derivatives Other (See Comments)    Doesn't remember     Labs/Other Studies Reviewed    The following studies were reviewed today: Echocardiogram 04/25/2019   IMPRESSIONS   1. Left ventricular ejection fraction, by visual estimation, is 60 to  65%. The left ventricle has normal function. Normal left ventricular size.  There is no left ventricular hypertrophy.   2. Global right ventricle has normal systolic function.The right  ventricular size is mildly enlarged. No increase in right ventricular wall  thickness.   3. Left atrial size  was normal.   4. Right atrial size was normal.   5. Mild mitral annular calcification.   6. The mitral valve is normal in structure. No evidence of mitral valve  regurgitation. No evidence of mitral stenosis.   7. The tricuspid valve is normal in structure. Tricuspid valve  regurgitation was not visualized by color flow Doppler.   8. The aortic valve is normal in structure. Aortic valve regurgitation is  trivial by color flow Doppler. Structurally normal aortic valve, with no  evidence of sclerosis or stenosis.   9. The pulmonic valve was normal in structure. Pulmonic valve   regurgitation is not visualized by color flow Doppler.  10. The inferior vena cava is normal in size with greater than 50%  respiratory variability, suggesting right atrial pressure of 3 mmHg.   Recent Labs: 03/24/2022: ALT 25 05/28/2022: BUN 19; Creatinine, Ser 0.92; Hemoglobin 13.4; Platelets 319; Potassium 3.9; Sodium 136  Recent Lipid Panel    Component Value Date/Time   CHOL 178 03/24/2022 0905   TRIG 98 03/24/2022 0905   HDL 48 03/24/2022 0905   CHOLHDL 3.7 03/24/2022 0905   CHOLHDL 4 12/30/2020 1034   VLDL 18.8 12/30/2020 1034   LDLCALC 112 (H) 03/24/2022 0905   LDLDIRECT 122.0 08/20/2014 0939    History of Present Illness    77 year old male with the above past medical history including CAD s/p CABG x 4 in 08/2018, postop atrial fibrillation, hypertension, hyperlipidemia, osteopenia, GERD, BPH and asthma.  He has a history of abnormal coronary CT angiogram. Cardiac catheterization 03/2019 revealed multivessel disease.  He underwent CABG x 4 (LIMA-LAD, RIMA hyperlipidemia, right radial-distal obtuse marginal, and RI) in 04/2019.  He did have postop atrial fibrillation.  He was last seen in the office on 03/24/2022 and was stable from a cardiac standpoint.  He underwent hip surgery in 05/2022.  He saw his PCP in 07/2022 was noted to have elevated BP.  He was started on amlodipine.   He presents today for follow-up since his last visit he has been stable overall from a cardiac standpoint.  Over the past 2 to 3 weeks he has experienced significant sinus/nasal congestion. Since this time of upper respiratory symptoms he has also noted some increased shortness of breath with activity.  He notes his symptoms are not similar to his prior anginal equivalent.  Since starting amlodipine he has noticed nonpitting bilateral lower extremity edema. He denies chest pain, PND, orthopnea, weight gain.  He had a steroid injection for left shoulder pain yesterday and notes this greatly improved his nasal  congestion.  He has also noticed improvement in his shortness of breath. He does have a history of significant asthma and follows with both Dr. Melvyn Novas as well as an allergy specialist.  Other than his recent shortness of breath, he reports feeling well.  Home Medications    Current Outpatient Medications  Medication Sig Dispense Refill   acetaminophen (TYLENOL) 500 MG tablet Take 500-1,000 mg by mouth every 6 (six) hours as needed for moderate pain or headache.     amLODipine (NORVASC) 5 MG tablet TAKE 1 TABLET (5 MG TOTAL) BY MOUTH DAILY. 90 tablet 1   atorvastatin (LIPITOR) 80 MG tablet TAKE 1 TABLET BY MOUTH DAILY AT 6 PM. 90 tablet 0   azelastine (ASTELIN) 0.1 % nasal spray Place 2 sprays into both nostrils 2 (two) times daily. Use in each nostril as directed 30 mL 0   budesonide (PULMICORT) 0.25 MG/2ML nebulizer solution USE 1 VIAL  IN NEBULIZER TWICE DAILY (Patient taking differently: Take 0.25 mg by nebulization 2 (two) times daily.) 120 mL 11   Chlorphen-PE-Acetaminophen (NOREL AD) 4-10-325 MG TABS Take 1 tablet by mouth 3 (three) times daily as needed (Nasal congestion). 30 tablet 3   clindamycin (CLEOCIN) 300 MG capsule Take 2 capsules EVERY 1 HOUR before dental procedure.     clopidogrel (PLAVIX) 75 MG tablet Take 1 tablet (75 mg total) by mouth daily. 90 tablet 1   ezetimibe (ZETIA) 10 MG tablet Take 1 tablet (10 mg total) by mouth daily. 90 tablet 3   formoterol (PERFOROMIST) 20 MCG/2ML nebulizer solution TAKE 2 MLS BY NEBULIZATION TWICE DAILY 360 mL 11   metoprolol tartrate (LOPRESSOR) 50 MG tablet TAKE 1/2 TABLET TWICE A DAY BY MOUTH 90 tablet 0   montelukast (SINGULAIR) 10 MG tablet Take 1 tablet (10 mg total) by mouth at bedtime. 90 tablet 4   pantoprazole (PROTONIX) 40 MG tablet TAKE 1 TABLET BY MOUTH EVERY DAY 90 tablet 2   telmisartan (MICARDIS) 80 MG tablet TAKE 1 TABLET BY MOUTH EVERY DAY 90 tablet 3   No current facility-administered medications for this visit.     Review  of Systems   He denies chest pain, palpitations, pnd, orthopnea, n, v, dizziness, syncope, weight gain, or early satiety. All other systems reviewed and are otherwise negative except as noted above.   Physical Exam    VS:  BP 134/68   Pulse 68   Ht 5' 8"$  (1.727 m)   Wt 188 lb 9.6 oz (85.5 kg)   SpO2 100%   BMI 28.68 kg/m   GEN: Well nourished, well developed, in no acute distress. HEENT: normal. Neck: Supple, no JVD, carotid bruits, or masses. Cardiac: RRR, no murmurs, rubs, or gallops. No clubbing, cyanosis, nonpitting bilateral lower extremity edema.  Radials/DP/PT 2+ and equal bilaterally.  Respiratory:  Respirations regular and unlabored, clear to auscultation bilaterally. GI: Soft, nontender, nondistended, BS + x 4. MS: no deformity or atrophy. Skin: warm and dry, no rash. Neuro:  Strength and sensation are intact. Psych: Normal affect.  Accessory Clinical Findings    ECG personally reviewed by me today -NSR, 68 bpm- no acute changes.   Lab Results  Component Value Date   WBC 19.9 (H) 05/28/2022   HGB 13.4 05/28/2022   HCT 41.0 05/28/2022   MCV 86.5 05/28/2022   PLT 319 05/28/2022   Lab Results  Component Value Date   CREATININE 0.92 05/28/2022   BUN 19 05/28/2022   NA 136 05/28/2022   K 3.9 05/28/2022   CL 102 05/28/2022   CO2 24 05/28/2022   Lab Results  Component Value Date   ALT 25 03/24/2022   AST 28 03/24/2022   ALKPHOS 136 (H) 03/24/2022   BILITOT 0.4 03/24/2022   Lab Results  Component Value Date   CHOL 178 03/24/2022   HDL 48 03/24/2022   LDLCALC 112 (H) 03/24/2022   LDLDIRECT 122.0 08/20/2014   TRIG 98 03/24/2022   CHOLHDL 3.7 03/24/2022    Lab Results  Component Value Date   HGBA1C 5.8 (H) 04/27/2019    Assessment & Plan    1. Shortness of breath/bilateral lower extremity edema: He reports a 2 to 3-week history of increased dyspnea on exertion in the setting of significant sinus/nasal congestion.  Additionally, since starting  amlodipine he has noticed nonpitting bilateral lower extremity edema.  He denies any chest pain, PND, orthopnea, weight gain.  He had a steroid injection for left  shoulder pain yesterday and since this time he noted improvement in his nasal congestion and shortness of breath.  I suspect his symptoms are largely related to recent URI symptoms, he states symptoms are not similar to prior anginal equivalent.  He does have follow-up scheduled with Dr. Melvyn Novas.  I suspect his lower extremity edema is in the setting of new amlodipine therapy. However, given history of CAD, will repeat echocardiogram.  Continue to monitor symptoms.  2. CAD: S/p CABG x 4 in 08/2018.  Stable with no anginal symptoms.  He has had some recent shortness of breath as above, not similar to prior anginal equivalent.  Will repeat echo as above.  Continue to monitor symptoms, no further ischemic evaluation indicated at this time.  3. Postop atrial fibrillation: Maintaining sinus rhythm. Not on anticoagulation.  Denies any recent palpitations.  Continue metoprolol.  4. Hypertension: BP overall well controlled.  He has noticed new bilateral lower extremity edema since starting amlodipine.  Discussed possible discontinuation of amlodipine, however, patient is agreeable to continue amlodipine at this time.  If tolerate amlodipine could consider transitioning from metoprolol to carvedilol.  He is not an ideal candidate for thiazide diuretic given documented history of sulfonamide allergy.  Could consider spironolactone.  For now, continue current antihypertensive regimen.   5. Hyperlipidemia: LDL was 112 in 02/2022. Zetia was added.  He is overdue for repeat fasting lipids, LFTs, will repeat today.  Continue atorvastatin, Zetia.  6. Disposition: Follow-up in 3 months.     Lenna Sciara, NP 09/04/2022, 10:48 AM

## 2022-09-04 NOTE — Patient Instructions (Signed)
Medication Instructions:  Your physician recommends that you continue on your current medications as directed. Please refer to the Current Medication list given to you today.   *If you need a refill on your cardiac medications before your next appointment, please call your pharmacy*   Lab Work: Your physician recommends that you complete lab work today. Fasting Lipid Panel, LFTs  If you have labs (blood work) drawn today and your tests are completely normal, you will receive your results only by: MyChart Message (if you have MyChart) OR A paper copy in the mail If you have any lab test that is abnormal or we need to change your treatment, we will call you to review the results.   Testing/Procedures: Your physician has requested that you have an echocardiogram. Echocardiography is a painless test that uses sound waves to create images of your heart. It provides your doctor with information about the size and shape of your heart and how well your heart's chambers and valves are working. This procedure takes approximately one hour. There are no restrictions for this procedure. Please do NOT wear cologne, perfume, aftershave, or lotions (deodorant is allowed). Please arrive 15 minutes prior to your appointment time.    Follow-Up: At [facility], you and your health needs are our priority.  As part of our continuing mission to provide you with exceptional heart care, we have created designated Provider Care Teams.  These Care Teams include your primary Cardiologist (physician) and Advanced Practice Providers (APPs -  Physician Assistants and Nurse Practitioners) who all work together to provide you with the care you need, when you need it.  We recommend signing up for the patient portal called "MyChart".  Sign up information is provided on this After Visit Summary.  MyChart is used to connect with patients for Virtual Visits (Telemedicine).  Patients are able to view lab/test results,  encounter notes, upcoming appointments, etc.  Non-urgent messages can be sent to your provider as well.   To learn more about what you can do with MyChart, go to [email or web address].    Your next appointment:   3 month(s)  Provider:   Diona Browner, NP        Other Instructions

## 2022-09-05 ENCOUNTER — Encounter: Payer: Self-pay | Admitting: Family Medicine

## 2022-09-05 DIAGNOSIS — R0981 Nasal congestion: Secondary | ICD-10-CM | POA: Insufficient documentation

## 2022-09-05 DIAGNOSIS — R21 Rash and other nonspecific skin eruption: Secondary | ICD-10-CM | POA: Insufficient documentation

## 2022-09-05 DIAGNOSIS — T733XXA Exhaustion due to excessive exertion, initial encounter: Secondary | ICD-10-CM | POA: Insufficient documentation

## 2022-09-05 LAB — LIPID PANEL
Chol/HDL Ratio: 2.2 ratio (ref 0.0–5.0)
Cholesterol, Total: 133 mg/dL (ref 100–199)
HDL: 61 mg/dL (ref >39)
LDL Chol Calc (NIH): 58 mg/dL (ref 0–99)
Triglycerides: 67 mg/dL (ref 0–149)
VLDL Cholesterol Cal: 14 mg/dL (ref 5–40)

## 2022-09-05 LAB — HEPATIC FUNCTION PANEL
ALT: 35 IU/L (ref 0–44)
AST: 36 IU/L (ref 0–40)
Albumin: 4.6 g/dL (ref 3.8–4.8)
Alkaline Phosphatase: 132 IU/L — ABNORMAL HIGH (ref 44–121)
Bilirubin Total: 0.3 mg/dL (ref 0.0–1.2)
Bilirubin, Direct: 0.13 mg/dL (ref 0.00–0.40)
Total Protein: 6.7 g/dL (ref 6.0–8.5)

## 2022-09-05 NOTE — Assessment & Plan Note (Addendum)
Rash on right leg, presumed eczema, topical hydrocortisone twice daily for 5 days , then as needed, if persists refer to Dermatoology

## 2022-09-05 NOTE — Assessment & Plan Note (Signed)
Established CAD wit new onset exertional fatigue , Cardiology to eval asap

## 2022-09-05 NOTE — Assessment & Plan Note (Signed)
Controlled, no change in medication DASH diet and commitment to daily physical activity for a minimum of 30 minutes discussed and encouraged, as a part of hypertension management. The importance of attaining a healthy weight is also discussed.     09/04/2022    9:29 AM 09/04/2022    9:10 AM 09/01/2022    3:16 PM 09/01/2022    2:21 PM 08/03/2022   10:40 AM 08/03/2022   10:07 AM 08/03/2022   10:02 AM  BP/Weight  Systolic BP [id number]  Diastolic BP 68 78 67 71 74 62 76  Wt. (Lbs)  188.6  184.04   183.2  BMI  28.68 kg/m2  27.98 kg/m2   27.86 kg/m2

## 2022-09-05 NOTE — Progress Notes (Signed)
   Antonio Carlson     MRN: [id number]      DOB: 1946-06-08   HPI Antonio Carlson is here with several concerns PCP is Dr. Posey Pronto C/o uncontrolled hypertension and brings a log from home with varied readings,he was recently started on amlodipine, and c/o new leg swelling and increased exertional fatigue wit minimal activity in the past several weeks C/o left shoulder pain and swelling , no trauma with reduced mobility x 2 weeks C/o nasal congestion congestion, rummy  nose , watery eyes, no fever or chills , no sore throat or cough C/o scaly rash right leg x 1 week, has ahd this in the past, no bleeding or itching, current flare is approx 2 weeks ROS Denies recent fever or chills.,  Denies chest pain, PND or orthopnea  Denies abdominal pain, nausea, vomiting,diarrhea or constipation.   Denies dysuria, frequency, hesitancy or incontinence.  Denies depression, anxiety or insomnia. PE  BP 110/67   Pulse (!) 102   Ht '5\' 8"'$  (1.727 m)   Wt 184 lb 0.6 oz (83.5 kg)   SpO2 93%   BMI 27.98 kg/m   Patient alert and oriented and in no cardiopulmonary distress.  HEENT: No facial asymmetry, EOMI,     Neck supple .  Chest: Clear to auscultation bilaterally.Decreased though adequate air entry  CVS: S1, S2 no murmurs, no S3.Regular rate.  ABD: Soft non tender.   Ext: trace edema  MS: Adequate ROM spine,  hips and knees.Markedly reduced ROM left shoulder with swelling   Skin: Intact, scaly rash max diameter approx 2 in noted on right leg Psych: Good eye contact, normal affect. Memory intact not anxious or depressed appearing.  CNS: CN 2-12 intact, power,  normal throughout.no focal deficits noted.   Assessment & Plan  Shoulder pain, left 2 week history unprovoked refer Ortho, bursitris  Essential hypertension Controlled, no change in medication DASH diet and commitment to daily physical activity for a minimum of 30 minutes discussed and encouraged, as a part of hypertension management. The  importance of attaining a healthy weight is also discussed.     09/04/2022    9:29 AM 09/04/2022    9:10 AM 09/01/2022    3:16 PM 09/01/2022    2:21 PM 08/03/2022   10:40 AM 08/03/2022   10:07 AM 08/03/2022   10:02 AM  BP/Weight  Systolic BP 315 945 859 292 446 286 381  Diastolic BP 68 78 67 71 74 62 76  Wt. (Lbs)  188.6  184.04   183.2  BMI  28.68 kg/m2  27.98 kg/m2   27.86 kg/m2       Fatigue due to excessive exertion Established CAD wit new onset exertional fatigue , Cardiology to eval asap  Nasal sinus congestion Current flare start astellin nasal spray daily  Rash and nonspecific skin eruption Rash on right leg, presumed eczema, topical hydrocortisone twice daily for 5 days , then as needed, if persists refer to Dermatoology

## 2022-09-05 NOTE — Assessment & Plan Note (Signed)
Current flare start astellin nasal spray daily

## 2022-09-10 ENCOUNTER — Telehealth: Payer: Self-pay

## 2022-09-10 NOTE — Telephone Encounter (Signed)
Spoke with pt. Pt was notified of lab results. Pt will continue his current medication and follow up as planned.

## 2022-09-16 DIAGNOSIS — M7542 Impingement syndrome of left shoulder: Secondary | ICD-10-CM | POA: Diagnosis not present

## 2022-10-08 ENCOUNTER — Ambulatory Visit (INDEPENDENT_AMBULATORY_CARE_PROVIDER_SITE_OTHER): Payer: Medicare Other | Admitting: Internal Medicine

## 2022-10-08 ENCOUNTER — Encounter: Payer: Self-pay | Admitting: Internal Medicine

## 2022-10-08 VITALS — BP 128/68 | HR 72 | Ht 68.0 in | Wt 179.8 lb

## 2022-10-08 DIAGNOSIS — Z886 Allergy status to analgesic agent status: Secondary | ICD-10-CM

## 2022-10-08 DIAGNOSIS — J339 Nasal polyp, unspecified: Secondary | ICD-10-CM | POA: Diagnosis not present

## 2022-10-08 DIAGNOSIS — J45909 Unspecified asthma, uncomplicated: Secondary | ICD-10-CM

## 2022-10-08 MED ORDER — PREDNISONE 10 MG PO TABS: ORAL_TABLET | ORAL | 0 refills | Status: DC

## 2022-10-08 MED ORDER — CEFDINIR 300 MG PO CAPS: 300.0000 mg | ORAL_CAPSULE | Freq: Two times a day (BID) | ORAL | 0 refills | Status: DC

## 2022-10-08 NOTE — Assessment & Plan Note (Addendum)
Quit smoking 1971  - PFT's  09/16/2015  FEV1 1.81 (61 % ) ratio 56  p 44 % improvement from saba with DLCO  81/81c % corrects to 107  % for alv volume  Done prior to any am meds  - 06/16/2018  After extensive coaching inhaler device,  effectiveness =    90%  - 10/2018 rx prednisone as plan C x 6 days - Allergy profile 10/09/19   >  Eos 0.9 /  IgE  106 - 01/01/2020 submitted paperwork for dupixent> did not go thru > referred to Dr Neldon Mc 07/01/2020  - 12/30/2020 referred again to Dr [name] service  - improved p starting dupixent 12/8/2 but only received 2 infusions the ? 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  To help them reached the target tissue, the patient should use Afrin two puffs every 12 hours applied one min before using the nasal steroids.  Afrin should be stopped after no more than 5 days.  If the symptoms worsen, Afrin can be restarted after 5 days off of therapy to prevent rebound congestion from overuse of Afrin.  I also emphasized that in no way are nasal steroids a concern in terms of "addiction".   Omnicef 300 mg bid x 10 day / Prednisone 10 mg take  4 each am x 2 days,   2 each am x 2 days,  1 each am x 2 days and stop   F/u ent as planned  No change in resp rx.  F/u 6 weeks/ sooner if needed          Each maintenance medication was reviewed in detail including emphasizing most importantly the difference between maintenance and prns and under what circumstances the prns are to be triggered using an action plan format where appropriate.  Total time for H and P, chart review, counseling, reviewing nasal  device(s) and generating customized AVS unique to this office visit / same day charting = 36 min

## 2022-10-08 NOTE — Progress Notes (Signed)
Subjective:    Patient ID: Antonio Carlson, male    DOB: [birth year]   MRN: [id number]  Brief patient profile:  77 yowm quit smoking in 1971 with history of Triad Asthma and hyperlipidemia and hbp    Brief patient profile:  06/23/2016 NP  Follow up : HTN, Hyperlipidemia/Triad Asthma  Pt returns for 3 month follow up and med review .  We reviewed all his meds and organized them into a med calendar w/ pt education . He appears to be taking correctly except for flonase , has not been using on regular basis.  Complains of 1 week of nasal congestion , drainage, stuffiness, sneezing, and ear fullness. No fever or sinus pain. Minimal cough .   rec Prednisone taper as directed.  Follow med calendar closely and bring to each visit.  Restart Flonase     10/09/2019  f/u ov/[place] re: triad asthma/ last pred Dec 2020 for back not breathing  Chief Complaint  Patient presents with   Follow-up    Breathing is doing well and he has not been using his albuterol. He has occ cough that he related to sinus drainage. Also c/o nasal congestion.   Dyspnea:  Nl pace flat surface fine = MMRC1 = can walk nl pace, flat grade, can't hurry or go uphills or steps s sob   -walking up to 30 min daily  Cough: assoc with nasal congestion/some slt discolored mucus  Sleeping: able to lie flat/one pillow  SABA use: very rarely  02: none  rec Plan A = Automatic = Always=    Budesonide/ Performist twice daily per nebulizer  Plan B = Backup (to supplement plan A, not to replace it) Only use your albuterol(yellow is proventil)  inhaler Plan C = Crisis - if Plan B stops working or you need more of it than usual - Prednisone 10 mg take  4 each am x 2 days,   2 each am x 2 days,  1 each am x 2 days and stop  Stop zestoril = lisinopril  Start micardis (telmasartan) 80 mg one half daily if blood pressure not at goal take a whole pill daily  See [name] w/in 3 months with all your medications,  Move up [name] f/u to 2 weeks  to recheck K and add hctz if elevated (eg micardis 40-12.5)      12/30/2020  f/u ov/Bobbye Reinitz re: Triad asthma maint on perforomist / budesonide no prednisone      09/23/2021  f/u ov/Tyrrell Stephens re: triad asthma maint on pulmocort/performist   off pred x months, nasal symptoms had improved p starting dupixent 12/8/2 but only received 2 infusions the ? Adverse drug effects = arthralgias and rx stopped but nasal symptoms worsened since    10/08/2022  f/u ov/[place] [facility]/Katriana Dortch re: triad asthma maint on laba/ics neb / no nasal steroids - aftrin only thing that works for nasal congestion   Chief Complaint  Patient presents with   Acute Visit    Having issues with sinuses  Has echo scheduled/ planned with cardiologist  Dyspnea:  able to do grocery shop / hip not limiting now  Cough: yellow thick x sev weeks Sleeping: 20-30 degrees hob/electric but last sev days in recliner due to noct nasal congestion   SABA use: just pulmocort/performist 02: none     No obvious day to day or daytime variability or assoc mucus plugs or hemoptysis or cp or chest tightness, subjective wheeze or overt  hb symptoms.  Also denies any obvious fluctuation of symptoms with weather or environmental changes or other aggravating or alleviating factors except as outlined above   No unusual exposure hx or h/o childhood pna/ asthma or knowledge of premature birth.  Current Allergies, Complete Past Medical History, Past Surgical History, Family History, and Social History were reviewed in [employer] record.  ROS  The following are not active complaints unless bolded Hoarseness, sore throat, dysphagia, dental problems, itching, sneezing,  nasal congestion or discharge of excess mucus or purulent secretions, ear ache,   fever, chills, sweats, unintended wt loss or wt gain, classically pleuritic or exertional cp,  orthopnea pnd or arm/hand swelling  or leg swelling, presyncope, palpitations, abdominal pain,  anorexia, nausea, vomiting, diarrhea  or change in bowel habits or change in bladder habits, change in stools or change in urine, dysuria, hematuria,  rash, arthralgias, visual complaints, headache, numbness, weakness or ataxia or problems with walking or coordination,  change in mood or  memory.        Current Meds  Medication Sig   acetaminophen (TYLENOL) 500 MG tablet Take 500-1,000 mg by mouth every 6 (six) hours as needed for moderate pain or headache.   amLODipine (NORVASC) 5 MG tablet TAKE 1 TABLET (5 MG TOTAL) BY MOUTH DAILY.   atorvastatin (LIPITOR) 80 MG tablet TAKE 1 TABLET BY MOUTH DAILY AT 6 PM.   azelastine (ASTELIN) 0.1 % nasal spray Place 2 sprays into both nostrils 2 (two) times daily. Use in each nostril as directed   budesonide (PULMICORT) 0.25 MG/2ML nebulizer solution USE 1 VIAL IN NEBULIZER TWICE DAILY (Patient taking differently: Take 0.25 mg by nebulization 2 (two) times daily.)   Chlorphen-PE-Acetaminophen (NOREL AD) 4-10-325 MG TABS Take 1 tablet by mouth 3 (three) times daily as needed (Nasal congestion).   clindamycin (CLEOCIN) 300 MG capsule Take 2 capsules EVERY 1 HOUR before dental procedure.   clopidogrel (PLAVIX) 75 MG tablet Take 1 tablet (75 mg total) by mouth daily.   ezetimibe (ZETIA) 10 MG tablet Take 1 tablet (10 mg total) by mouth daily.   formoterol (PERFOROMIST) 20 MCG/2ML nebulizer solution TAKE 2 MLS BY NEBULIZATION TWICE DAILY   metoprolol tartrate (LOPRESSOR) 50 MG tablet TAKE 1/2 TABLET TWICE A DAY BY MOUTH   montelukast (SINGULAIR) 10 MG tablet Take 1 tablet (10 mg total) by mouth at bedtime.   pantoprazole (PROTONIX) 40 MG tablet TAKE 1 TABLET BY MOUTH EVERY DAY   telmisartan (MICARDIS) 80 MG tablet TAKE 1 TABLET BY MOUTH EVERY DAY            PMH PVCs.  Syncope  - Holter ordered June 27, 2008 > nl  - EP consult June 27, 2008  OSTEOPENIA (ICD-733.90)  COLONIC POLYPS (ICD-211.3) .......................................[name] Kitchen [name] -  see colonoscopy 11/07/10 (repeat in 10 yr)  DIVERTICULOSIS, MILD (ICD-562.10) ASTHMA (ICD-493.90)  - HFA 90% June 11, 2009  ALLERGIC RHINITIS, CHRONIC (ICD-477.9)  - steroid dep until mid oct 2009  - Allergy profile sent January 23, 2010 >> IgE 18.3  - Polypectomy Sept 2012 ......................................................  [name]/ [facility]  BENIGN PROSTATIC HYPERTROPHY, HX OF (ICD-V13.8) .[name] Kitchen... 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  [place].......................................................[name] Kitchen referred to [facility] 09/23/2021  R C6/7 radiculopathy 2015 .....................................................[name] KitchenTrenton Gammon  - Pneumovax 11/2004 and @ age 72 June 11, 2009 and Prevnar 13 08/14/2013  - Td 07/2005   And  08/01/2015  - 2nd  Covid May 2021        Past Surgical History:  Appendectomy  Colonoscopy     Family History:  heart disease in his father onset at age 81 he was a smoker  Ca brain half brother  68 siblings  - one lung ca, one breast ca, one brain ca Mother dementia onset late 46s lived to be 44    Social History:  quit smoking 1971  rarely drink alcohol  Retired  Widower 2018  - remarried sept 2020        Objective:   Physical Exam  Wts  10/08/2022   179  09/23/2021   175  12/30/2020     172 07/01/2020   177 01/01/2020      175  10/09/2019    170   03/16/2019   179  wt 198 May 17, 2008 >  > 192 07/17/11 >  02/05/2012  191> 05/10/2012  188> 08/10/2012 190> 11/14/2012 180>182 12/12/2012 > 03/14/2013 182 > 07/06/2013 185 > 08/14/2013  184 > 01/31/2014  176 >176 .03/22/2014 >  05/31/2014   181 > 06/11/14  179 > 08/20/2014   184> 02/18/2015  181 > 08/01/2015  178 > 09/16/2015 182  > 12/16/2015   185 > 03/17/2016  185> 08/07/2016   187  > 10/22/2016    183 > 01/22/2017   180> 07/29/2017   177 > 10/28/2017  169 >   06/16/2018  175 > 12/15/2018  182 >  Vital signs reviewed  10/08/2022  - Note at rest 02 sats  98% on RA   General appearance:    pleasant amb wm obvious nasal tone    HEENT : Oropharynx  clear     Nasal turbinates mod edema/ no viz polyps    NECK :  without  apparent JVD/ palpable Nodes/TM    LUNGS: no acc muscle use,  Nl contour chest which is clear to A and P bilaterally without cough on insp or exp maneuvers   CV:  RRR  no s3 or murmur or increase in P2, and trace bilateral pitting ankle edema   ABD:  soft and nontender    MS:  Nl gait/ ext warm without deformities Or obvious joint restrictions  calf tenderness, cyanosis or clubbing    SKIN: warm and dry without lesions    NEURO:  alert, approp, nl sensorium with  no motor or cerebellar deficits apparent.

## 2022-10-08 NOTE — Patient Instructions (Signed)
I emphasized that nasal steroids have no immediate benefit in terms of improving symptoms.  To help them reached the target tissue, the patient should use Afrin two puffs every 12 hours applied one min before using the nasal steroids.  Afrin should be stopped after no more than 5 days.  If the symptoms worsen, Afrin can be restarted after 5 days off of therapy to prevent rebound congestion from overuse of Afrin.  I also emphasized that in no way are nasal steroids a concern in terms of "addiction".   Prednisone 10 mg take  4 each am x 2 days,   2 each am x 2 days,  1 each am x 2 days and stop   Omnicef 300 mg twice daily x 10 days   Please schedule a follow up office visit in 6 weeks, call sooner if needed

## 2022-10-13 ENCOUNTER — Ambulatory Visit (HOSPITAL_COMMUNITY): Payer: Medicare Other | Attending: Internal Medicine

## 2022-10-13 ENCOUNTER — Ambulatory Visit (INDEPENDENT_AMBULATORY_CARE_PROVIDER_SITE_OTHER): Payer: Medicare Other | Admitting: Internal Medicine

## 2022-10-13 ENCOUNTER — Encounter: Payer: Self-pay | Admitting: Internal Medicine

## 2022-10-13 VITALS — BP 125/67 | HR 76 | Ht 68.0 in | Wt 182.8 lb

## 2022-10-13 DIAGNOSIS — J302 Other seasonal allergic rhinitis: Secondary | ICD-10-CM

## 2022-10-13 DIAGNOSIS — R600 Localized edema: Secondary | ICD-10-CM | POA: Insufficient documentation

## 2022-10-13 DIAGNOSIS — R0602 Shortness of breath: Secondary | ICD-10-CM | POA: Diagnosis not present

## 2022-10-13 DIAGNOSIS — M7989 Other specified soft tissue disorders: Secondary | ICD-10-CM

## 2022-10-13 DIAGNOSIS — J45909 Unspecified asthma, uncomplicated: Secondary | ICD-10-CM

## 2022-10-13 DIAGNOSIS — Z886 Allergy status to analgesic agent status: Secondary | ICD-10-CM | POA: Diagnosis not present

## 2022-10-13 DIAGNOSIS — I10 Essential (primary) hypertension: Secondary | ICD-10-CM | POA: Diagnosis not present

## 2022-10-13 DIAGNOSIS — J339 Nasal polyp, unspecified: Secondary | ICD-10-CM | POA: Diagnosis not present

## 2022-10-13 LAB — ECHOCARDIOGRAM COMPLETE
Area-P 1/2: 3.31 cm2
P 1/2 time: 544 msec
S' Lateral: 3.2 cm

## 2022-10-13 NOTE — Assessment & Plan Note (Signed)
Well controlled with Pulmicort and Perforomist neb ?Did not tolerate Dupixent ?On Singulair ?Followed by Dr [name] ?

## 2022-10-13 NOTE — Assessment & Plan Note (Signed)
Likely from amlodipine Advised to perform leg elevation Can take amlodipine half tablet if persistent leg swelling If persistent, can had HCTZ

## 2022-10-13 NOTE — Progress Notes (Signed)
Established Patient Office Visit  Subjective:  Patient ID: Antonio Carlson, male    DOB: 1945/12/31  Age: 77 y.o. MRN: [id number]  CC:  Chief Complaint  Patient presents with   Foot Swelling    Patient states since having a change in his blood pressure medication he has noticed swelling in his feet and ankles    HPI Antonio Carlson is a 77 y.o. male with past medical history of HTN, CAD s/p CABG, asthma, allergic rhinitis and OA of hip who presents for f/u of his chronic medical conditions.  HTN: BP is well-controlled. Takes medications regularly. Patient denies headache, dizziness, or palpitations.  He was given amlodipine for uncontrolled HTN in the last visit.  He is having bilateral leg swelling due to it.  CAD s/p CABG: He is on Plavix and statin currently.  He also takes metoprolol for it.  He denies any anginal chest pain or dyspnea currently.  He has history of asthma and allergic rhinitis, for which he uses Perforomist and Pulmicort neb.  He denies any dyspnea or wheezing currently.  He follows up with Dr Melvyn Novas.  He has tried Dupixent, but had an allergic reaction to it.  He denies any fever or chills.  He was recently given clindamycin and prednisone for acute sinusitis by pulmonology.   Past Medical History:  Diagnosis Date   Allergy    Anginal pain ([place])    Arthritis    Asthma    Benign prostatic hypertrophy    Coronary artery disease    Diverticulosis    Dyspnea    GERD (gastroesophageal reflux disease)    History of kidney stones    Hyperlipidemia    Hypertension    Nephrolithiasis    Pleural effusion on right    Pneumonia    PVC's (premature ventricular contractions)    RECTAL BLEEDING 09/15/2010   Qualifier: Diagnosis of  By: Trellis Paganini PA-c, Amy S    Shingles    twice   Syncope     Past Surgical History:  Procedure Laterality Date   APPENDECTOMY     CORONARY ARTERY BYPASS GRAFT N/A 05/01/2019   Procedure: CORONARY ARTERY BYPASS GRAFTING (CABG) TIMES  FOUR USING LEFT AND RIGHT INTERNAL MAMMARY ARTERIES AND RIGHT RADIAL ARTERY WITH STERNAL PLATING;  Surgeon: [name], MD;  Location: [place];  Service: Open Heart Surgery;  Laterality: N/A;   EYE SURGERY Bilateral    catartact surgery   LEFT HEART CATH AND CORONARY ANGIOGRAPHY N/A 04/18/2019   Procedure: LEFT HEART CATH AND CORONARY ANGIOGRAPHY;  Surgeon: Belva Crome, MD;  Location: [facility];  Service: Cardiovascular;  Laterality: N/A;   NASAL SINUS SURGERY  03/28/2011   RADIAL ARTERY HARVEST Right 05/01/2019   Procedure: Right Radial Artery Harvest;  Surgeon: [name], MD;  Location: [facility];  Service: Open Heart Surgery;  Laterality: Right;   TEE WITHOUT CARDIOVERSION N/A 05/01/2019   Procedure: TRANSESOPHAGEAL ECHOCARDIOGRAM (TEE);  Surgeon: [name], MD;  Location: [facility];  Service: Open Heart Surgery;  Laterality: N/A;   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   Procedure: TOTAL HIP ARTHROPLASTY ANTERIOR APPROACH;  Surgeon: Gaynelle Arabian, MD;  Location: [facility];  Service: Orthopedics;  Laterality: Left;    Family History  Problem Relation Age of Onset   Heart attack Father     Social History   Socioeconomic History   Marital status: Married    Spouse name: Not on file   Number of  children: Not on file   Years of education: Not on file   Highest education level: Not on file  Occupational History   Not on file  Tobacco Use   Smoking status: Former    Packs/day: 2.00    Years: 14.00    Additional pack years: 0.00    Total pack years: 28.00    Types: Cigarettes    Quit date: 07/27/1969    Years since quitting: 53.2   Smokeless tobacco: Never  Vaping Use   Vaping Use: Never used  Substance and Sexual Activity   Alcohol use: Yes    Comment: beer occ   Drug use: Never   Sexual activity: Yes  Other Topics Concern   Not on file  Social History Narrative   Not on file   Social Determinants of Health   Financial Resource Strain: Low Risk   (01/07/2022)   Overall Financial Resource Strain (CARDIA)    Difficulty of Paying Living Expenses: Not hard at all  Food Insecurity: No Food Insecurity (05/27/2022)   Hunger Vital Sign    Worried About Running Out of Food in the Last Year: Never true    Ran Out of Food in the Last Year: Never true  Transportation Needs: No Transportation Needs (05/27/2022)   PRAPARE - Hydrologist (Medical): No    Lack of Transportation (Non-Medical): No  Physical Activity: Sufficiently Active (01/07/2022)   Exercise Vital Sign    Days of Exercise per Week: 7 days    Minutes of Exercise per Session: 30 min  Stress: No Stress Concern Present (01/07/2022)   [place]    Feeling of Stress : Not at all  Social Connections: Moderately Integrated (01/07/2022)   Social Connection and Isolation Panel [NHANES]    Frequency of Communication with Friends and Family: More than three times a week    Frequency of Social Gatherings with Friends and Family: More than three times a week    Attends Religious Services: More than 4 times per year    Active Member of [employer] or Organizations: No    Attends Archivist Meetings: Never    Marital Status: Married  Human resources officer Violence: Not At Risk (05/27/2022)   Humiliation, Afraid, Rape, and Kick questionnaire    Fear of Current or Ex-Partner: No    Emotionally Abused: No    Physically Abused: No    Sexually Abused: No    Outpatient Medications Prior to Visit  Medication Sig Dispense Refill   acetaminophen (TYLENOL) 500 MG tablet Take 500-1,000 mg by mouth every 6 (six) hours as needed for moderate pain or headache.     amLODipine (NORVASC) 5 MG tablet TAKE 1 TABLET (5 MG TOTAL) BY MOUTH DAILY. 90 tablet 1   atorvastatin (LIPITOR) 80 MG tablet TAKE 1 TABLET BY MOUTH DAILY AT 6 PM. 90 tablet 0   azelastine (ASTELIN) 0.1 % nasal spray Place 2 sprays into both nostrils 2  (two) times daily. Use in each nostril as directed 30 mL 0   budesonide (PULMICORT) 0.25 MG/2ML nebulizer solution USE 1 VIAL IN NEBULIZER TWICE DAILY (Patient taking differently: Take 0.25 mg by nebulization 2 (two) times daily.) 120 mL 11   cefdinir (OMNICEF) 300 MG capsule Take 1 capsule (300 mg total) by mouth 2 (two) times daily. 20 capsule 0   Chlorphen-PE-Acetaminophen (NOREL AD) 4-10-325 MG TABS Take 1 tablet by mouth 3 (three) times daily as needed (  Nasal congestion). 30 tablet 3   clindamycin (CLEOCIN) 300 MG capsule Take 2 capsules EVERY 1 HOUR before dental procedure.     clopidogrel (PLAVIX) 75 MG tablet Take 1 tablet (75 mg total) by mouth daily. 90 tablet 1   ezetimibe (ZETIA) 10 MG tablet Take 1 tablet (10 mg total) by mouth daily. 90 tablet 3   formoterol (PERFOROMIST) 20 MCG/2ML nebulizer solution TAKE 2 MLS BY NEBULIZATION TWICE DAILY 360 mL 11   metoprolol tartrate (LOPRESSOR) 50 MG tablet TAKE 1/2 TABLET TWICE A DAY BY MOUTH 90 tablet 0   montelukast (SINGULAIR) 10 MG tablet Take 1 tablet (10 mg total) by mouth at bedtime. 90 tablet 4   pantoprazole (PROTONIX) 40 MG tablet TAKE 1 TABLET BY MOUTH EVERY DAY 90 tablet 2   predniSONE (DELTASONE) 10 MG tablet Take  4 each am x 2 days,   2 each am x 2 days,  1 each am x 2 days and stop 14 tablet 0   telmisartan (MICARDIS) 80 MG tablet TAKE 1 TABLET BY MOUTH EVERY DAY 90 tablet 3   No facility-administered medications prior to visit.    Allergies  Allergen Reactions   Aspirin Shortness Of Breath   Amoxicillin     GI upset Did it involve swelling of the face/tongue/throat, SOB, or low BP? No Did it involve sudden or severe rash/hives, skin peeling, or any reaction on the inside of your mouth or nose? No Did you need to seek medical attention at a hospital or doctor's office? No When did it last happen?    20+ years   If all above answers are "NO", may proceed with cephalosporin use.    Sulfonamide Derivatives Other (See  Comments)    Doesn't remember    ROS Review of Systems  Constitutional:  Negative for chills and fever.  HENT:  Positive for congestion, postnasal drip and sinus pain. Negative for sore throat.   Eyes:  Negative for pain and discharge.  Respiratory:  Negative for cough and shortness of breath.   Cardiovascular:  Positive for leg swelling. Negative for chest pain and palpitations.  Gastrointestinal:  Negative for diarrhea, nausea and vomiting.  Endocrine: Negative for polydipsia and polyuria.  Genitourinary:  Negative for dysuria and hematuria.  Musculoskeletal:  Positive for arthralgias and back pain. Negative for neck pain and neck stiffness.  Skin:  Negative for rash.  Neurological:  Positive for numbness. Negative for dizziness, weakness and headaches.  Psychiatric/Behavioral:  Negative for agitation and behavioral problems.       Objective:    Physical Exam Vitals reviewed.  Constitutional:      General: He is not in acute distress.    Appearance: He is not diaphoretic.  HENT:     Head: Normocephalic and atraumatic.     Nose: Congestion present.     Left Sinus: Maxillary sinus tenderness present.     Mouth/Throat:     Mouth: Mucous membranes are moist.  Eyes:     General: No scleral icterus.    Extraocular Movements: Extraocular movements intact.  Cardiovascular:     Rate and Rhythm: Normal rate and regular rhythm.     Pulses: Normal pulses.     Heart sounds: Normal heart sounds. No murmur heard. Pulmonary:     Breath sounds: Normal breath sounds. No wheezing or rales.  Musculoskeletal:     Cervical back: Neck supple. No tenderness.     Right lower leg: No edema.     Left lower  leg: No edema.  Skin:    General: Skin is warm.     Findings: No rash.  Neurological:     General: No focal deficit present.     Mental Status: He is alert and oriented to person, place, and time.  Psychiatric:        Mood and Affect: Mood normal.        Behavior: Behavior normal.      BP 125/67 (BP Location: Left Arm, Patient Position: Sitting, Cuff Size: Normal)   Pulse 76   Ht 5\' 8"  (1.727 m)   Wt 182 lb 12.8 oz (82.9 kg)   SpO2 95%   BMI 27.79 kg/m  Wt Readings from Last 3 Encounters:  10/13/22 182 lb 12.8 oz (82.9 kg)  10/08/22 179 lb 12.8 oz (81.6 kg)  09/04/22 188 lb 9.6 oz (85.5 kg)    Lab Results  Component Value Date   TSH 1.17 07/01/2021   Lab Results  Component Value Date   WBC 19.9 (H) 05/28/2022   HGB 13.4 05/28/2022   HCT 41.0 05/28/2022   MCV 86.5 05/28/2022   PLT 319 05/28/2022   Lab Results  Component Value Date   NA 136 05/28/2022   K 3.9 05/28/2022   CO2 24 05/28/2022   GLUCOSE 134 (H) 05/28/2022   BUN 19 05/28/2022   CREATININE 0.92 05/28/2022   BILITOT 0.3 09/04/2022   ALKPHOS 132 (H) 09/04/2022   AST 36 09/04/2022   ALT 35 09/04/2022   PROT 6.7 09/04/2022   ALBUMIN 4.6 09/04/2022   CALCIUM 8.8 (L) 05/28/2022   ANIONGAP 10 05/28/2022   EGFR 79 03/24/2022   GFR 78.99 07/01/2021   Lab Results  Component Value Date   CHOL 133 09/04/2022   Lab Results  Component Value Date   HDL 61 09/04/2022   Lab Results  Component Value Date   LDLCALC 58 09/04/2022   Lab Results  Component Value Date   TRIG 67 09/04/2022   Lab Results  Component Value Date   CHOLHDL 2.2 09/04/2022   Lab Results  Component Value Date   HGBA1C 5.8 (H) 04/27/2019      Assessment & Plan:   Problem List Items Addressed This Visit       Cardiovascular and Mediastinum   Essential hypertension - Primary (Chronic)    BP Readings from Last 1 Encounters:  10/13/22 125/67  Well-controlled with Telmisartan 80 mg QD, Amlodipine 5 mg QD and Metoprolol 50 mg QD Has leg swelling with amlodipine, advised to take half tablet daily and perform leg elevation as tolerated Recently had Echo, plan to add diuretic if any signs of CHF Counseled for compliance with the medications Advised DASH diet and moderate exercise/walking as tolerated         Respiratory   Triad asthma (Chronic)    Well controlled with Pulmicort and Perforomist neb Did not tolerate Dupixent On Singulair Followed by Dr Melvyn Novas      Allergic rhinitis    Uses Flonase and Astelin nasal spray Was given clindamycin and prednisone recently        Other   Leg swelling    Likely from amlodipine Advised to perform leg elevation Can take amlodipine half tablet if persistent leg swelling If persistent, can had HCTZ       No orders of the defined types were placed in this encounter.   Follow-up: Return in about 4 months (around 02/12/2023) for HTN and CAD.    Lindell Spar, MD

## 2022-10-13 NOTE — Patient Instructions (Signed)
Please continue to take medications as prescribed.  Please continue to follow low carb diet and perform moderate exercise/walking at least 150 mins/week.  Please perform leg elevation as tolerated for leg swelling.  If you have persistent leg swelling, please take Amlodipine half-tablet once daily.

## 2022-10-13 NOTE — Assessment & Plan Note (Signed)
Uses Flonase and Astelin nasal spray Was given clindamycin and prednisone recently

## 2022-10-13 NOTE — Assessment & Plan Note (Addendum)
BP Readings from Last 1 Encounters:  10/13/22 125/67   Well-controlled with Telmisartan 80 mg QD, Amlodipine 5 mg QD and Metoprolol 50 mg QD Has leg swelling with amlodipine, advised to take half tablet daily and perform leg elevation as tolerated Recently had Echo, plan to add diuretic if any signs of CHF Counseled for compliance with the medications Advised DASH diet and moderate exercise/walking as tolerated

## 2022-10-26 DIAGNOSIS — R351 Nocturia: Secondary | ICD-10-CM | POA: Diagnosis not present

## 2022-10-26 DIAGNOSIS — R3912 Poor urinary stream: Secondary | ICD-10-CM | POA: Diagnosis not present

## 2022-10-26 DIAGNOSIS — N401 Enlarged prostate with lower urinary tract symptoms: Secondary | ICD-10-CM | POA: Diagnosis not present

## 2022-11-04 ENCOUNTER — Telehealth: Payer: Self-pay

## 2022-11-04 NOTE — Telephone Encounter (Signed)
Lmom to discuss Echo results. Pt is aware that results will be mailed to him.

## 2022-11-05 ENCOUNTER — Encounter: Payer: Self-pay | Admitting: Family Medicine

## 2022-11-05 ENCOUNTER — Ambulatory Visit (INDEPENDENT_AMBULATORY_CARE_PROVIDER_SITE_OTHER): Payer: Medicare Other | Admitting: Family Medicine

## 2022-11-05 ENCOUNTER — Ambulatory Visit (HOSPITAL_COMMUNITY)
Admission: RE | Admit: 2022-11-05 | Discharge: 2022-11-05 | Disposition: A | Discharge status: Home / Self Care | Payer: Medicare Other | Source: Ambulatory Visit | Attending: Family Medicine | Admitting: Family Medicine

## 2022-11-05 VITALS — BP 138/82 | HR 76 | Ht 68.0 in | Wt 185.0 lb

## 2022-11-05 DIAGNOSIS — J320 Chronic maxillary sinusitis: Secondary | ICD-10-CM | POA: Diagnosis not present

## 2022-11-05 DIAGNOSIS — R059 Cough, unspecified: Secondary | ICD-10-CM

## 2022-11-05 DIAGNOSIS — R062 Wheezing: Secondary | ICD-10-CM | POA: Diagnosis not present

## 2022-11-05 MED ORDER — DOXYCYCLINE HYCLATE 100 MG PO TABS: 100.0000 mg | ORAL_TABLET | Freq: Two times a day (BID) | ORAL | 0 refills | Status: AC

## 2022-11-05 MED ORDER — METHYLPREDNISOLONE 4 MG PO TBPK: ORAL_TABLET | ORAL | 0 refills | Status: DC

## 2022-11-05 MED ORDER — FLUTICASONE PROPIONATE 50 MCG/ACT NA SUSP: 2.0000 | Freq: Every day | NASAL | 6 refills | Status: DC

## 2022-11-05 NOTE — Progress Notes (Signed)
Acute Office Visit  Subjective:    Patient ID: Antonio Carlson, male    DOB: [birth year], 77 y.o.   MRN: [id number]  Chief Complaint  Patient presents with   Sinus Problem    Pt reports sinus infection, sx of nose clogged up and some wheezing, had trouble sleeping last night.     HPI Patient is in today with complaints of a sinus infection for a month.  He was treated on 10/08/2022 with prednisone taper for 6 days and clindamycin for 10 days.  He reports some relief of symptoms with the prednisone taper.  He complains of nasal congestion, cough, sneezing, and maxillary pressure.  No fever or chills reported.  Of note, the patient has a history of asthma, allergic rhinitis and two polypectomies.  He reports having pneumonia a few years ago and is concerned about possible pneumonia.  Past Medical History:  Diagnosis Date   Allergy    Anginal pain    Arthritis    Asthma    Benign prostatic hypertrophy    Coronary artery disease    Diverticulosis    Dyspnea    GERD (gastroesophageal reflux disease)    History of kidney stones    Hyperlipidemia    Hypertension    Nephrolithiasis    Pleural effusion on right    Pneumonia    PVC's (premature ventricular contractions)    RECTAL BLEEDING 09/15/2010   Qualifier: Diagnosis of  By: Monica Becton PA-c, Amy S    Shingles    twice   Syncope     Past Surgical History:  Procedure Laterality Date   APPENDECTOMY     CORONARY ARTERY BYPASS GRAFT N/A 05/01/2019   Procedure: CORONARY ARTERY BYPASS GRAFTING (CABG) TIMES FOUR USING LEFT AND RIGHT INTERNAL MAMMARY ARTERIES AND RIGHT RADIAL ARTERY WITH STERNAL PLATING;  Surgeon: Linden Dolin, MD;  Location: [facility] OR;  Service: Open Heart Surgery;  Laterality: N/A;   EYE SURGERY Bilateral    catartact surgery   LEFT HEART CATH AND CORONARY ANGIOGRAPHY N/A 04/18/2019   Procedure: LEFT HEART CATH AND CORONARY ANGIOGRAPHY;  Surgeon: Lyn Records, MD;  Location: [facility] INVASIVE CV LAB;  Service:  Cardiovascular;  Laterality: N/A;   NASAL SINUS SURGERY  03/28/2011   RADIAL ARTERY HARVEST Right 05/01/2019   Procedure: Right Radial Artery Harvest;  Surgeon: Linden Dolin, MD;  Location: [facility] OR;  Service: Open Heart Surgery;  Laterality: Right;   TEE WITHOUT CARDIOVERSION N/A 05/01/2019   Procedure: TRANSESOPHAGEAL ECHOCARDIOGRAM (TEE);  Surgeon: Linden Dolin, MD;  Location: [facility];  Service: Open Heart Surgery;  Laterality: N/A;   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   Procedure: TOTAL HIP ARTHROPLASTY ANTERIOR APPROACH;  Surgeon: Ollen Gross, MD;  Location: [facility] ORS;  Service: Orthopedics;  Laterality: Left;    Family History  Problem Relation Age of Onset   Heart attack Father     Social History   Socioeconomic History   Marital status: Married    Spouse name: Not on file   Number of children: Not on file   Years of education: Not on file   Highest education level: Not on file  Occupational History   Not on file  Tobacco Use   Smoking status: Former    Packs/day: 2.00    Years: 14.00    Additional pack years: 0.00    Total pack years: 28.00    Types: Cigarettes    Quit date: 07/27/1969    Years since quitting: 33.3  Smokeless tobacco: Never  Vaping Use   Vaping Use: Never used  Substance and Sexual Activity   Alcohol use: Yes    Comment: beer occ   Drug use: Never   Sexual activity: Yes  Other Topics Concern   Not on file  Social History Narrative   Not on file   Social Determinants of Health   Financial Resource Strain: Low Risk  (01/07/2022)   Overall Financial Resource Strain (CARDIA)    Difficulty of Paying Living Expenses: Not hard at all  Food Insecurity: No Food Insecurity (05/27/2022)   Hunger Vital Sign    Worried About Running Out of Food in the Last Year: Never true    Ran Out of Food in the Last Year: Never true  Transportation Needs: No Transportation Needs (05/27/2022)   PRAPARE - Administrator, Civil ServiceTransportation    Lack of Transportation (Medical):  No    Lack of Transportation (Non-Medical): No  Physical Activity: Sufficiently Active (01/07/2022)   Exercise Vital Sign    Days of Exercise per Week: 7 days    Minutes of Exercise per Session: 30 min  Stress: No Stress Concern Present (01/07/2022)   [employer] - Occupational Stress Questionnaire    Feeling of Stress : Not at all  Social Connections: Moderately Integrated (01/07/2022)   Social Connection and Isolation Panel [NHANES]    Frequency of Communication with Friends and Family: More than three times a week    Frequency of Social Gatherings with Friends and Family: More than three times a week    Attends Religious Services: More than 4 times per year    Active Member of [employer] Financial[employer]ubs or Organizations: No    Attends BankerClub or Organization Meetings: Never    Marital Status: Married  Catering managerntimate Partner Violence: Not At Risk (05/27/2022)   Humiliation, Afraid, Rape, and Kick questionnaire    Fear of Current or Ex-Partner: No    Emotionally Abused: No    Physically Abused: No    Sexually Abused: No    Outpatient Medications Prior to Visit  Medication Sig Dispense Refill   acetaminophen (TYLENOL) 500 MG tablet Take 500-1,000 mg by mouth every 6 (six) hours as needed for moderate pain or headache.     amLODipine (NORVASC) 5 MG tablet TAKE 1 TABLET (5 MG TOTAL) BY MOUTH DAILY. 90 tablet 1   atorvastatin (LIPITOR) 80 MG tablet TAKE 1 TABLET BY MOUTH DAILY AT 6 PM. 90 tablet 0   azelastine (ASTELIN) 0.1 % nasal spray Place 2 sprays into both nostrils 2 (two) times daily. Use in each nostril as directed 30 mL 0   budesonide (PULMICORT) 0.25 MG/2ML nebulizer solution USE 1 VIAL IN NEBULIZER TWICE DAILY (Patient taking differently: Take 0.25 mg by nebulization 2 (two) times daily.) 120 mL 11   Chlorphen-PE-Acetaminophen (NOREL AD) 4-10-325 MG TABS Take 1 tablet by mouth 3 (three) times daily as needed (Nasal congestion). 30 tablet 3   clopidogrel (PLAVIX) 75 MG tablet Take  1 tablet (75 mg total) by mouth daily. 90 tablet 1   ezetimibe (ZETIA) 10 MG tablet Take 1 tablet (10 mg total) by mouth daily. 90 tablet 3   formoterol (PERFOROMIST) 20 MCG/2ML nebulizer solution TAKE 2 MLS BY NEBULIZATION TWICE DAILY 360 mL 11   metoprolol tartrate (LOPRESSOR) 50 MG tablet TAKE 1/2 TABLET TWICE A DAY BY MOUTH 90 tablet 0   montelukast (SINGULAIR) 10 MG tablet Take 1 tablet (10 mg total) by mouth at bedtime. 90 tablet 4  pantoprazole (PROTONIX) 40 MG tablet TAKE 1 TABLET BY MOUTH EVERY DAY 90 tablet 2   telmisartan (MICARDIS) 80 MG tablet TAKE 1 TABLET BY MOUTH EVERY DAY 90 tablet 3   cefdinir (OMNICEF) 300 MG capsule Take 1 capsule (300 mg total) by mouth 2 (two) times daily. 20 capsule 0   clindamycin (CLEOCIN) 300 MG capsule Take 2 capsules EVERY 1 HOUR before dental procedure.     predniSONE (DELTASONE) 10 MG tablet Take  4 each am x 2 days,   2 each am x 2 days,  1 each am x 2 days and stop 14 tablet 0   No facility-administered medications prior to visit.    Allergies  Allergen Reactions   Aspirin Shortness Of Breath   Amoxicillin     GI upset Did it involve swelling of the face/tongue/throat, SOB, or low BP? No Did it involve sudden or severe rash/hives, skin peeling, or any reaction on the inside of your mouth or nose? No Did you need to seek medical attention at a hospital or doctor's office? No When did it last happen?    20+ years   If all above answers are "NO", may proceed with cephalosporin use.    Sulfonamide Derivatives Other (See Comments)    Doesn't remember    Review of Systems  Constitutional:  Negative for chills, fatigue and fever.  HENT:  Positive for congestion and sinus pressure. Negative for ear discharge, sinus pain and tinnitus.   Eyes:  Negative for visual disturbance.  Respiratory:  Positive for wheezing. Negative for chest tightness and shortness of breath.   Cardiovascular:  Negative for chest pain and palpitations.  Neurological:   Negative for dizziness and headaches.       Objective:    Physical Exam HENT:     Head: Normocephalic.     Right Ear: External ear normal.     Left Ear: External ear normal.     Nose: No congestion or rhinorrhea.     Right Sinus: Maxillary sinus tenderness present.     Left Sinus: Maxillary sinus tenderness present.     Mouth/Throat:     Mouth: Mucous membranes are moist.     Pharynx: Uvula midline.     Tonsils: No tonsillar exudate.  Cardiovascular:     Rate and Rhythm: Regular rhythm.     Heart sounds: No murmur heard. Pulmonary:     Effort: No respiratory distress.     Breath sounds: Wheezing present.  Neurological:     Mental Status: He is alert.     BP 138/82   Pulse 76   Ht 5\' 8"  (1.727 m)   Wt 185 lb 0.6 oz (83.9 kg)   SpO2 93%   BMI 28.14 kg/m  Wt Readings from Last 3 Encounters:  11/05/22 185 lb 0.6 oz (83.9 kg)  10/13/22 182 lb 12.8 oz (82.9 kg)  10/08/22 179 lb 12.8 oz (81.6 kg)       Assessment & Plan:  Chronic maxillary sinusitis Assessment & Plan: Will treat today with Medrol Dosepak and doxycycline for 5 days Referral placed to ENT Will get an x-ray of the chest to rule out pneumonia Flonase nasal spray ordered Encouraged to take medication as prescribed. Increase fluids and allow for plenty of rest. Recommended using a humidifier at bedtime during sleep to help with cough and nasal congestion. Follow-up if your symptoms do not improve     Orders: -     Ambulatory referral to ENT -  methylPREDNISolone; Take as the package instructed  Dispense: 1 each; Refill: 0 -     Doxycycline Hyclate; Take 1 tablet (100 mg total) by mouth 2 (two) times daily for 5 days.  Dispense: 10 tablet; Refill: 0 -     Fluticasone Propionate; Place 2 sprays into both nostrils daily.  Dispense: 16 g; Refill: 6  Cough in adult -     DG Chest 2 View    Gilmore Laroche, [place]

## 2022-11-05 NOTE — Patient Instructions (Addendum)
I appreciate the opportunity to provide care to you today!    Follow up:  Dr. Allena Katz  Please pick up your prescriptions at the pharmacy  Please stop by [facility] hospital anytime to get an x-ray of you chest to r/o pneumonia  Take medication as prescribed. Increase fluids and allow for plenty of rest. Recommend using a humidifier at bedtime during sleep to help with cough and nasal congestion. Follow-up if your symptoms do not improve    Referrals today-  ENT   Please continue to a heart-healthy diet and increase your physical activities. Try to exercise for at least five days a week.      It was a pleasure to see you and I look forward to continuing to work together on your health and well-being. Please do not hesitate to call the office if you need care or have questions about your care.   Have a wonderful day and week. With Gratitude, Gilmore Laroche MSN, FNP-BC

## 2022-11-05 NOTE — Assessment & Plan Note (Signed)
Will treat today with Medrol Dosepak and doxycycline for 5 days Referral placed to ENT Will get an x-ray of the chest to rule out pneumonia Flonase nasal spray ordered Encouraged to take medication as prescribed. Increase fluids and allow for plenty of rest. Recommended using a humidifier at bedtime during sleep to help with cough and nasal congestion. Follow-up if your symptoms do not improve

## 2022-11-09 NOTE — Progress Notes (Signed)
Please inform the patient that his X-ray was neg for pneumonia

## 2022-11-11 ENCOUNTER — Ambulatory Visit (INDEPENDENT_AMBULATORY_CARE_PROVIDER_SITE_OTHER): Payer: Medicare Other | Admitting: Podiatry

## 2022-11-11 ENCOUNTER — Encounter: Payer: Self-pay | Admitting: Podiatry

## 2022-11-11 VITALS — BP 116/57 | HR 57

## 2022-11-11 DIAGNOSIS — B351 Tinea unguium: Secondary | ICD-10-CM | POA: Diagnosis not present

## 2022-11-11 DIAGNOSIS — M79675 Pain in left toe(s): Secondary | ICD-10-CM | POA: Diagnosis not present

## 2022-11-11 DIAGNOSIS — M79674 Pain in right toe(s): Secondary | ICD-10-CM

## 2022-11-11 NOTE — Progress Notes (Signed)
This patient returns to my office for at risk foot care.  This patient requires this care by a professional since this patient will be at risk due to having neuralgia.  This patient is unable to cut nails himself since the patient cannot reach his nails.These nails are painful walking and wearing shoes.  This patient presents for at risk foot care today.  General Appearance  Alert, conversant and in no acute stress.  Vascular  Dorsalis pedis and posterior tibial  pulses are palpable  bilaterally.  Capillary return is within normal limits  bilaterally. Temperature is within normal limits  bilaterally.  Neurologic  Senn-Weinstein monofilament wire test within normal limits  bilaterally. Muscle power within normal limits bilaterally.  Nails Thick disfigured discolored nails with subungual debris  from hallux to fifth toes bilaterally. No evidence of bacterial infection or drainage bilaterally.  Orthopedic  No limitations of motion  feet .  No crepitus or effusions noted.  No bony pathology or digital deformities noted.  Skin  normotropic skin with no porokeratosis noted bilaterally.  No signs of infections or ulcers noted.     Onychomycosis  Pain in right toes  Pain in left toes  Consent was obtained for treatment procedures.   Mechanical debridement of nails 1-5  bilaterally performed with a nail nipper.  Filed with dremel without incident.    Return office visit     3 months                 Told patient to return for periodic foot care and evaluation due to potential at risk complications.   Helane Gunther DPM

## 2022-11-13 ENCOUNTER — Other Ambulatory Visit: Payer: Self-pay | Admitting: Internal Medicine

## 2022-11-18 NOTE — Progress Notes (Unsigned)
Subjective:    Patient ID: Antonio Carlson, male    DOB: 23-Mar-1946   MRN: [id number]  Brief patient profile:  77 yowm quit smoking in 1971 with history of Triad Asthma and hyperlipidemia and hbp    Brief patient profile:  79/28/2017 NP  Follow up : HTN, Hyperlipidemia/Triad Asthma  Pt returns for 3 month follow up and med review .  We reviewed all his meds and organized them into a med calendar w/ pt education . He appears to be taking correctly except for flonase , has not been using on regular basis.  Complains of 1 week of nasal congestion , drainage, stuffiness, sneezing, and ear fullness. No fever or sinus pain. Minimal cough .   rec Prednisone taper as directed.  Follow med calendar closely and bring to each visit.  Restart Flonase     10/09/2019  f/u ov/Antonio Carlson re: triad asthma/ last pred Dec 2020 for back not breathing  Chief Complaint  Patient presents with   Follow-up    Breathing is doing well and he has not been using his albuterol. He has occ cough that he related to sinus drainage. Also c/o nasal congestion.   Dyspnea:  Nl pace flat surface fine = MMRC1 = can walk nl pace, flat grade, can't hurry or go uphills or steps s sob   -walking up to 30 min daily  Cough: assoc with nasal congestion/some slt discolored mucus  Sleeping: able to lie flat/one pillow  SABA use: very rarely  02: none  rec Plan A = Automatic = Always=    Budesonide/ Performist twice daily per nebulizer  Plan B = Backup (to supplement plan A, not to replace it) Only use your albuterol(yellow is proventil)  inhaler Plan C = Crisis - if Plan B stops working or you need more of it than usual - Prednisone 10 mg take  4 each am x 2 days,   2 each am x 2 days,  1 each am x 2 days and stop  Stop zestoril = lisinopril  Start micardis (telmasartan) 80 mg one half daily if blood pressure not at goal take a whole pill daily  See [name] w/in 3 months with all your medications,  Move up [name] f/u to 2 weeks  to recheck K and add hctz if elevated (eg micardis 40-12.5)      12/30/2020  f/u ov/Antonio Carlson re: Triad asthma maint on perforomist / budesonide no prednisone      09/23/2021  f/u ov/Antonio Carlson re: triad asthma maint on pulmocort/performist   off pred x months, nasal symptoms had improved p starting dupixent 12/8/2 but only received 2 infusions the ? Adverse drug effects = arthralgias and rx stopped but nasal symptoms worsened since    10/08/2022  f/u ov/Antonio Carlson/Antonio Carlson re: triad asthma maint on laba/ics neb / no nasal steroids - aftrin only thing that works for nasal congestion   Chief Complaint  Patient presents with   Acute Visit    Having issues with sinuses  Has echo scheduled/ planned with cardiologist  Dyspnea:  able to do grocery shop / hip not limiting now  Cough: yellow thick x sev weeks Sleeping: 20-30 degrees hob/electric but last sev days in recliner due to noct nasal congestion   SABA use: just pulmocort/performist 02: none Rec I emphasized that nasal steroids have no immediate benefit in terms of improving symptoms.   Prednisone 10 mg take  4 each am x 2 days,   2 each  am x 2 days,  1 each am x 2 days and stop  Omnicef 300 mg twice daily x 10 days   Please schedule a follow up Carlson visit in 6 weeks, call sooner if needed     11/19/2022  f/u ov/Antonio Carlson/Antonio Carlson re: *** maint on ***  No chief complaint on file.   Dyspnea:  *** Cough: *** Sleeping: *** SABA use: *** 02: *** Covid status: *** Lung cancer screening: ***   No obvious day to day or daytime variability or assoc excess/ purulent sputum or mucus plugs or hemoptysis or cp or chest tightness, subjective wheeze or overt sinus or hb symptoms.   *** without nocturnal  or early am exacerbation  of respiratory  c/o's or need for noct saba. Also denies any obvious fluctuation of symptoms with weather or environmental changes or other aggravating or alleviating factors except as outlined above   No unusual  exposure hx or h/o childhood pna/ asthma or knowledge of premature birth.  Current Allergies, Complete Past Medical History, Past Surgical History, Family History, and Social History were reviewed in [employer] record.  ROS  The following are not active complaints unless bolded Hoarseness, sore throat, dysphagia, dental problems, itching, sneezing,  nasal congestion or discharge of excess mucus or purulent secretions, ear ache,   fever, chills, sweats, unintended wt loss or wt gain, classically pleuritic or exertional cp,  orthopnea pnd or arm/hand swelling  or leg swelling, presyncope, palpitations, abdominal pain, anorexia, nausea, vomiting, diarrhea  or change in bowel habits or change in bladder habits, change in stools or change in urine, dysuria, hematuria,  rash, arthralgias, visual complaints, headache, numbness, weakness or ataxia or problems with walking or coordination,  change in mood or  memory.        No outpatient medications have been marked as taking for the 11/19/22 encounter (Appointment) with [name], MD.                [facility].  Syncope  - Holter ordered June 27, 2008 > nl  - EP consult June 27, 2008  OSTEOPENIA (ICD-733.90)  COLONIC POLYPS (ICD-211.3) .......................................[name] Kitchen [name] - see colonoscopy 11/07/10 (repeat in 10 yr)  DIVERTICULOSIS, MILD (ICD-562.10) ASTHMA (ICD-493.90)  - HFA 90% June 11, 2009  ALLERGIC RHINITIS, CHRONIC (ICD-477.9)  - steroid dep until mid oct 2009  - Allergy profile sent January 23, 2010 >> IgE 18.3  - Polypectomy Sept 2012 ......................................................  [name]/ [facility]  BENIGN PROSTATIC HYPERTROPHY, HX OF (ICD-V13.8) .[name] Kitchen... 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  [facility]  HEALTH  MAINTENANCE.......................................................[name] Kitchen referred to [facility] 09/23/2021  R C6/7 radiculopathy 2015 .....................................................[name] KitchenDutch Quint  - Pneumovax 11/2004 and @ age 30 June 11, 2009 and Prevnar 13 08/14/2013  - Td 07/2005   And  08/01/2015  - 2nd Covid May 2021        Past Surgical History:  Appendectomy  Colonoscopy     Family History:  heart disease in his father onset at age 87 he was a smoker  Ca brain half brother  Half siblings  - one lung ca, one breast ca, one brain ca Mother dementia onset late 25s lived to be 51    Social History:  quit smoking 1971  rarely drink alcohol  Retired  W[employer]ower 2018  - remarried sept 2020        Objective:   Physical Exam  Wts  11/19/2022   ***  10/08/2022   179  09/23/2021   175  12/30/2020     172 07/01/2020   177 01/01/2020      175  10/09/2019    170   03/16/2019   179  wt 198 May 17, 2008 >  > 192 07/17/11 >  02/05/2012  191> 05/10/2012  188> 08/10/2012 190> 11/14/2012 180>182 12/12/2012 > 03/14/2013 182 > 07/06/2013 185 > 08/14/2013  184 > 01/31/2014  176 >176 .03/22/2014 >  05/31/2014   181 > 06/11/14  179 > 08/20/2014   184> 02/18/2015  181 > 08/01/2015  178 > 09/16/2015 182  > 12/16/2015   185 > 03/17/2016  185> 08/07/2016   187  > 10/22/2016    183 > 01/22/2017   180> 07/29/2017   177 > 10/28/2017  169 >  06/16/2018  175 > 12/15/2018  182 >    Vital signs reviewed  11/19/2022  - Note at rest 02 sats  ***% on ***   General appearance:    *** trace bilateral pitting ankle edema

## 2022-11-19 ENCOUNTER — Ambulatory Visit (INDEPENDENT_AMBULATORY_CARE_PROVIDER_SITE_OTHER): Payer: Medicare Other | Admitting: Internal Medicine

## 2022-11-19 ENCOUNTER — Encounter: Payer: Self-pay | Admitting: Internal Medicine

## 2022-11-19 VITALS — BP 106/54 | HR 66 | Ht 68.0 in | Wt 184.6 lb

## 2022-11-19 DIAGNOSIS — J45909 Unspecified asthma, uncomplicated: Secondary | ICD-10-CM

## 2022-11-19 DIAGNOSIS — Z886 Allergy status to analgesic agent status: Secondary | ICD-10-CM

## 2022-11-19 DIAGNOSIS — J339 Nasal polyp, unspecified: Secondary | ICD-10-CM

## 2022-11-19 MED ORDER — PREDNISONE 10 MG PO TABS: ORAL_TABLET | ORAL | 0 refills | Status: DC

## 2022-11-19 NOTE — Patient Instructions (Signed)
If breathing/ wheezing or nasal congestion worsen > prednisone 10 mg x 2 with breakfast until better then 1 daily x 5 days and stop   Please schedule a follow up visit in 3 months but call sooner if needed

## 2022-11-19 NOTE — Assessment & Plan Note (Addendum)
Quit smoking 1971  - PFT's  09/16/2015  FEV1 1.81 (61 % ) ratio 56  p 44 % improvement from saba with DLCO  81/81c % corrects to 107  % for alv volume  Done prior to any am meds  - 06/16/2018  After extensive coaching inhaler device,  effectiveness =    90%  - 10/2018 rx prednisone as plan C x 6 days - Allergy profile 10/09/19   >  Eos 0.9 /  IgE  106 - 01/01/2020 submitted paperwork for dupixent> did not go thru > referred to Dr Lucie Leather 07/01/2020  - 12/30/2020 referred again to Dr Kathyrn Lass service  - improved p starting dupixent 07/03/21  but only received 2 infusions the ? Adverse drug effects = arthralgias/ "pnneumonia"  - 11/19/2022 prn prednisone restarted:  prednisone 10 mg x 2 with breakfast until better then 1 daily x 5 days and stop  Rhinitis/ sinusitis continue to be problematic more so than the asthma component and I question whether Riki Altes might be an option in this pt who failed dupixent but will wait until he completes his ENT re-eval to refer back to allergy and in meantime:  The goal with a chronic steroid dependent illness is always arriving at the lowest effective dose that controls the disease/symptoms and not accepting a set "formula" which is based on statistics or guidelines that don't always take into account patient  variability or the natural hx of the dz in every individual patient, which may well vary over time.  For now therefore I recommend the patient maintain  >>>  prn prednisone restarted:  prednisone 10 mg x 2 with breakfast until better then 1 daily x 5 days and stop         Each maintenance medication was reviewed in detail including emphasizing most importantly the difference between maintenance and prns and under what circumstances the prns are to be triggered using an action plan format where appropriate.  Total time for H and P, chart review, counseling, reviewing hfa/neb device(s) and generating customized AVS unique to this office visit / same day charting > 30 min  for  refractory chronic  respiratory  symptoms with tendency to flaring covered with written action plan

## 2022-11-25 ENCOUNTER — Ambulatory Visit (INDEPENDENT_AMBULATORY_CARE_PROVIDER_SITE_OTHER): Payer: Medicare Other | Admitting: Family Medicine

## 2022-11-25 ENCOUNTER — Encounter: Payer: Self-pay | Admitting: Family Medicine

## 2022-11-25 VITALS — BP 128/76 | HR 67 | Resp 16 | Ht 68.0 in | Wt 184.0 lb

## 2022-11-25 DIAGNOSIS — R109 Unspecified abdominal pain: Secondary | ICD-10-CM

## 2022-11-25 LAB — POCT URINALYSIS DIP (CLINITEK)
Bilirubin, UA: NEGATIVE
Blood, UA: NEGATIVE
Glucose, UA: NEGATIVE mg/dL
Ketones, POC UA: NEGATIVE mg/dL
Nitrite, UA: NEGATIVE
POC PROTEIN,UA: NEGATIVE
Spec Grav, UA: 1.015 (ref 1.010–1.025)
Urobilinogen, UA: 0.2 E.U./dL
pH, UA: 6.5 (ref 5.0–8.0)

## 2022-11-25 MED ORDER — TIZANIDINE HCL 4 MG PO CAPS
4.0000 mg | ORAL_CAPSULE | Freq: Three times a day (TID) | ORAL | 0 refills | Status: DC
Start: 2022-11-25 — End: 2023-01-15

## 2022-11-25 NOTE — Patient Instructions (Signed)

## 2022-11-25 NOTE — Assessment & Plan Note (Signed)
Musculoskeletal strain vs Kidney stone. US renal ordered to rule out Kidney stone- awaiting results will follow up. Prescribed Zanaflex 4 mg for pain  Can take OTC tylenol for pain  Urinalysis showed trace of leukocytes, Advise patient to increase water intake between 2-3 L a day. Limit intake of caffeine and sugary drinks.

## 2022-11-25 NOTE — Progress Notes (Signed)
Patient Office Visit   Subjective   Patient ID: Antonio Carlson, male    DOB: 11/25/1945  Age: 77 y.o. MRN: [id number]  CC:  Chief Complaint  Patient presents with   Flank Pain    X a few days- the day after mowing his yard he woke up and and felt like he pulled something in the right side of his back and its been hurting all night and he didn't get much sleep. Felt like a toothache. Has had one kidney stone years ago     HPI Antonio Carlson 77 year old male, presents to clinic for right flank pain started about a week ago after mowing his yard and felt like he pulled a muscle. He  has a past medical history of Allergy, Anginal pain (HCC), Arthritis, Asthma, Benign prostatic hypertrophy, Coronary artery disease, Diverticulosis, Dyspnea, GERD (gastroesophageal reflux disease), History of kidney stones, Hyperlipidemia, Hypertension, Nephrolithiasis, Pleural effusion on right, Pneumonia, PVC's (premature ventricular contractions), RECTAL BLEEDING (09/15/2010), Shingles, and Syncope.  Flank Pain This is a new problem. Patient reports pain occurs constantly and has been gradually worsening since onset. The pain is present in the lumbar spine, thoracic spine and sacro-iliac. The quality of the pain is described as aching, cramping and stabbing. The pain is at a severity of 9/10. The pain is The same all the time. The symptoms are aggravated by twisting, position and sitting. Stiffness is present all day. Pertinent negatives include no bladder incontinence, bowel incontinence, leg pain, numbness, paresis, paresthesias, tingling or weakness. Risk factors include poor posture and lack of exercise. He has tried bed rest for the symptoms. The treatment provided no relief. Past medical history of kidney stone.     Outpatient Encounter Medications as of 11/25/2022  Medication Sig   acetaminophen (TYLENOL) 500 MG tablet Take 500-1,000 mg by mouth every 6 (six) hours as needed for moderate pain or headache.    amLODipine (NORVASC) 5 MG tablet TAKE 1 TABLET (5 MG TOTAL) BY MOUTH DAILY.   atorvastatin (LIPITOR) 80 MG tablet TAKE 1 TABLET BY MOUTH DAILY AT 6 PM.   budesonide (PULMICORT) 0.25 MG/2ML nebulizer solution USE 1 VIAL IN NEBULIZER TWICE DAILY (Patient taking differently: Take 0.25 mg by nebulization 2 (two) times daily.)   Chlorphen-PE-Acetaminophen (NOREL AD) 4-10-325 MG TABS Take 1 tablet by mouth 3 (three) times daily as needed (Nasal congestion).   clopidogrel (PLAVIX) 75 MG tablet Take 1 tablet (75 mg total) by mouth daily.   ezetimibe (ZETIA) 10 MG tablet Take 1 tablet (10 mg total) by mouth daily.   fluticasone (FLONASE) 50 MCG/ACT nasal spray Place 2 sprays into both nostrils daily.   formoterol (PERFOROMIST) 20 MCG/2ML nebulizer solution TAKE 2 MLS BY NEBULIZATION TWICE DAILY   metoprolol tartrate (LOPRESSOR) 50 MG tablet TAKE 1/2 TABLET TWICE A DAY BY MOUTH   montelukast (SINGULAIR) 10 MG tablet Take 1 tablet (10 mg total) by mouth at bedtime.   pantoprazole (PROTONIX) 40 MG tablet TAKE 1 TABLET BY MOUTH EVERY DAY   telmisartan (MICARDIS) 80 MG tablet TAKE 1 TABLET BY MOUTH EVERY DAY   tiZANidine (ZANAFLEX) 4 MG capsule Take 1 capsule (4 mg total) by mouth 3 (three) times daily.   [DISCONTINUED] predniSONE (DELTASONE) 10 MG tablet 2 with breakfast until bette then 1 daily x 5 days and stop   No facility-administered encounter medications on file as of 11/25/2022.    Past Surgical History:  Procedure Laterality Date   APPENDECTOMY  CORONARY ARTERY BYPASS GRAFT N/A 05/01/2019   Procedure: CORONARY ARTERY BYPASS GRAFTING (CABG) TIMES FOUR USING LEFT AND RIGHT INTERNAL MAMMARY ARTERIES AND RIGHT RADIAL ARTERY WITH STERNAL PLATING;  Surgeon: Linden Dolin, MD;  Location: [facility] OR;  Service: Open Heart Surgery;  Laterality: N/A;   EYE SURGERY Bilateral    catartact surgery   LEFT HEART CATH AND CORONARY ANGIOGRAPHY N/A 04/18/2019   Procedure: LEFT HEART CATH AND CORONARY  ANGIOGRAPHY;  Surgeon: Lyn Records, MD;  Location: [facility]ASIVE [facility];  Service: Cardiovascular;  Laterality: N/A;   NASAL SINUS SURGERY  03/28/2011   RADIAL ARTERY HARVEST Right 05/01/2019   Procedure: Right Radial Artery Harvest;  Surgeon: Linden Dolin, MD;  Location: [facility] OR;  Service: Open Heart Surgery;  Laterality: Right;   TEE WITHOUT CARDIOVERSION N/A 05/01/2019   Procedure: TRANSESOPHAGEAL ECHOCARDIOGRAM (TEE);  Surgeon: Linden Dolin, MD;  Location: [facility] OR;  Service: Open Heart Surgery;  Laterality: N/A;   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   Procedure: TOTAL HIP ARTHROPLASTY ANTERIOR APPROACH;  Surgeon: Ollen Gross, MD;  Location: [facility] ORS;  Service: Orthopedics;  Laterality: Left;    Review of Systems  Constitutional:  Negative for chills and fever.  HENT:  Negative for hearing loss.   Eyes:  Negative for blurred vision.  Respiratory:  Negative for cough.   Cardiovascular:  Negative for chest pain.  Gastrointestinal:  Negative for abdominal pain, constipation, diarrhea, heartburn, nausea and vomiting.  Genitourinary:  Positive for flank pain. Negative for dysuria, frequency, hematuria and urgency.  Skin:  Negative for rash.  Neurological:  Negative for dizziness and headaches.  Psychiatric/Behavioral:  Negative for depression.       Objective    BP (!) 149/81   Pulse 67   Resp 16   Ht 5\' 8"  (1.727 m)   Wt 184 lb (83.5 kg)   SpO2 96%   BMI 27.98 kg/m   Physical Exam Vitals reviewed.  Constitutional:      General: He is not in acute distress.    Appearance: Normal appearance. He is not ill-appearing, toxic-appearing or diaphoretic.  HENT:     Head: Normocephalic.  Eyes:     General:        Right eye: No discharge.        Left eye: No discharge.     Conjunctiva/sclera: Conjunctivae normal.  Cardiovascular:     Rate and Rhythm: Normal rate.     Pulses: Normal pulses.     Heart sounds: Normal heart sounds.  Pulmonary:     Effort: Pulmonary effort  is normal. No respiratory distress.     Breath sounds: Normal breath sounds.  Abdominal:     General: Bowel sounds are normal.     Palpations: Abdomen is soft.     Tenderness: There is no abdominal tenderness. There is no right CVA tenderness, left CVA tenderness or guarding.  Musculoskeletal:        General: Normal range of motion.     Cervical back: Normal range of motion.  Skin:    General: Skin is warm and dry.     Capillary Refill: Capillary refill takes less than 2 seconds.  Neurological:     General: No focal deficit present.     Mental Status: He is alert and oriented to person, place, and time.     Coordination: Coordination normal.     Gait: Gait normal.  Psychiatric:        Mood and Affect: Mood  normal.        Behavior: Behavior normal.       Assessment & Plan:  Acute right flank pain -     POCT URINALYSIS DIP (CLINITEK) -     US RENAL; Future -     tiZANidine HCl; Take 1 capsule (4 mg total) by mouth 3 (three) times daily.  Dispense: 21 capsule; Refill: 0  Flank pain Assessment & Plan: Musculoskeletal strain vs Kidney stone. US renal ordered to rule out Kidney stone- awaiting results will follow up. Prescribed Zanaflex 4 mg for pain  Can take OTC tylenol for pain  Urinalysis showed trace of leukocytes, Advise patient to increase water intake between 2-3 L a day. Limit intake of caffeine and sugary drinks.       No follow-ups on file.   Cruzita Lederer Newman Nip, FNP

## 2022-11-26 ENCOUNTER — Ambulatory Visit: Payer: Medicare Other | Admitting: Family Medicine

## 2022-12-03 ENCOUNTER — Ambulatory Visit: Payer: Medicare Other | Admitting: Nurse Practitioner

## 2022-12-07 DIAGNOSIS — J339 Nasal polyp, unspecified: Secondary | ICD-10-CM | POA: Diagnosis not present

## 2022-12-07 DIAGNOSIS — J329 Chronic sinusitis, unspecified: Secondary | ICD-10-CM | POA: Diagnosis not present

## 2022-12-07 DIAGNOSIS — J45909 Unspecified asthma, uncomplicated: Secondary | ICD-10-CM | POA: Diagnosis not present

## 2022-12-09 ENCOUNTER — Ambulatory Visit (HOSPITAL_COMMUNITY)
Admission: RE | Admit: 2022-12-09 | Discharge: 2022-12-09 | Disposition: A | Discharge status: Home / Self Care | Payer: Medicare Other | Source: Ambulatory Visit | Attending: Family Medicine | Admitting: Family Medicine

## 2022-12-09 DIAGNOSIS — R109 Unspecified abdominal pain: Secondary | ICD-10-CM | POA: Diagnosis not present

## 2022-12-09 DIAGNOSIS — N281 Cyst of kidney, acquired: Secondary | ICD-10-CM | POA: Diagnosis not present

## 2022-12-11 ENCOUNTER — Other Ambulatory Visit: Payer: Self-pay | Admitting: Family Medicine

## 2022-12-11 DIAGNOSIS — N400 Enlarged prostate without lower urinary tract symptoms: Secondary | ICD-10-CM

## 2022-12-11 NOTE — Progress Notes (Signed)
Referral Placed to urology due to enlarged prostate  The prostate volume is 87 cc.

## 2022-12-14 NOTE — Progress Notes (Deleted)
Cardiology Clinic Note   Date: 12/14/2022 ID: Yonas, Hovde 04/26/1946, MRN [id number]  Primary Cardiologist:  Chrystie Nose, MD  Patient Profile    Antonio Carlson is a 77 y.o. male who presents to the clinic today for ***  Past medical history significant for: CAD. Coronary CTA with FFR 04/07/2019: Coronary calcium score 1187 (81st percentile).  Severe multivessel CAD.  FFR shows significant stenosis of proximal RCA and mid LCx.  There is a gradual decrease in LAD flow without discrete focal stenosis by FFR. LHC 04/18/2019: Severe three-vessel CAD involving RCA and LCx most severely.  LM and LAD have intermediate stenosis by coronary CT FFR the mid to distal LAD territory has a value of 0.8 or less.  LM with tubular 40 to 50% distal stenosis.  LAD larger diameter than LM.  Mid vessel eccentric 50 to 70% narrowing in the region of the third diagonal.  Each of 3 moderate to large diagonal branches have disease with 80% first diagonal, 80% second diagonal, and 50 to 60% third diagonal. >90% proximal stenosis and large ramus intermedius.  90% mid circumflex and 80% OM 3.  The circumflex system supplies collaterals to the right coronary.  RCA is functionally occluded and has left-to-right collaterals.  99% proximal stenosis, 80% mid stenosis diffuse disease proximal to the PDA.  Normal left ventricular function, normal LVEDP.  EF estimated 60%.  CTS surgery consult. 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 PAF. Onset postop CABG October 2020. Shortness of breath/lower extremity edema. Echo 10/13/2022: EF 60 to 65%.  Grade I DD.  Trivial MR.  Mild AI. Hypertension. Hyperlipidemia. Lipid panel 09/04/2022: LDL 58, HDL 61, TG 67, total 133. GERD. Asthma.   History of Present Illness    Antonio Carlson was first evaluated by Dr. Rennis Golden on 03/15/2019 for chest pain at the request of Dr. Sherene Sires.  He underwent coronary CTA  which showed severe multivessel CAD confirmed by FFR.  He ultimately underwent CABG x 4 in October 2020 (detailed above).  He continues to be followed by Dr. Rennis Golden for the above outlined history.  Patient was last seen in the office by Bernadene Person, NP on 09/04/2022 for follow-up.  He complains of shortness of breath and lower extremity edema.  He reported nonpitting bilateral lower extremity edema since starting amlodipine.  Shortness of breath was evaluated by echo which showed normal LV function, Grade I DD.  Today, patient ***  CAD.  S/p CABG x 30 April 2019.  Patient*** Continue atorvastatin, ezetimibe, Plavix, amlodipine, metoprolol. PAF.  Onset post CABG October 2020.  Denies palpitations.***Patient is maintaining sinus rhythm.  He is not on anticoagulation. Shortness of breath.  Echo March 2024 showed normal LV function, Grade I DD.  Patient*** Lower extremity edema.  Patient*** Hypertension. BP today *** Patient denies headaches, dizziness or vision changes. Continue amlodipine, Toprol, telmisartan. Hyperlipidemia.  LDL February 2024 58, at goal.  Continue atorvastatin and ezetimibe.   ROS: All other systems reviewed and are otherwise negative except as noted in History of Present Illness.  Studies Reviewed    ECG personally reviewed by me today: ***  No significant changes from ***  Risk Assessment/Calculations    {Does this patient have ATRIAL FIBRILLATION?:[phone]} No BP recorded.  {Refresh Note OR Click here to enter BP  :1}***        Physical Exam    VS:  There were  no vitals taken for this visit. , BMI There is no height or weight on file to calculate BMI.  GEN: Well nourished, well developed, in no acute distress. Neck: No JVD or carotid bruits. Cardiac: *** RRR. No murmurs. No rubs or gallops.   Respiratory:  Respirations regular and unlabored. Clear to auscultation without rales, wheezing or rhonchi. GI: Soft, nontender, nondistended. Extremities: Radials/DP/PT  2+ and equal bilaterally. No clubbing or cyanosis. No edema ***  Skin: Warm and dry, no rash. Neuro: Strength intact.  Assessment & Plan   ***  Disposition: ***     {Are you ordering a CV Procedure (e.g. stress test, cath, DCCV, TEE, etc)?   Press F2        :[id number]}   Signed, Etta Grandchild. Kenesha Moshier, DNP, NP-C

## 2022-12-15 ENCOUNTER — Ambulatory Visit: Payer: Medicare Other | Attending: Nurse Practitioner | Admitting: Student

## 2022-12-27 ENCOUNTER — Other Ambulatory Visit: Payer: Self-pay | Admitting: Internal Medicine

## 2023-01-13 ENCOUNTER — Encounter: Payer: Self-pay | Admitting: Internal Medicine

## 2023-01-13 ENCOUNTER — Ambulatory Visit (INDEPENDENT_AMBULATORY_CARE_PROVIDER_SITE_OTHER): Payer: Medicare Other | Admitting: Internal Medicine

## 2023-01-13 VITALS — BP 129/74 | HR 69 | Resp 16 | Ht 68.0 in | Wt 182.0 lb

## 2023-01-13 DIAGNOSIS — Z0000 Encounter for general adult medical examination without abnormal findings: Secondary | ICD-10-CM | POA: Diagnosis not present

## 2023-01-13 NOTE — Progress Notes (Signed)
Subjective:   Antonio Carlson is a 77 y.o. male who presents for Medicare Annual/Subsequent preventive examination.  Visit Complete: In person  Patient Medicare AWV questionnaire was completed by the patient on 01/13/2023; I have confirmed that all information answered by patient is correct and no changes since this date.  Review of Systems    Review of Systems  All other systems reviewed and are negative.      Objective:    Today's Vitals   01/13/23 1306 01/13/23 1319  BP: 129/74   Pulse: 69   Resp: 16   SpO2: 94%   Weight: 182 lb (82.6 kg)   Height: 5\' 8"  (1.727 m)   PainSc:  0-No pain   Body mass index is 27.67 kg/m.     01/13/2023    1:22 PM 05/27/2022    6:32 PM 05/20/2022    8:29 AM 01/07/2022    1:31 PM 08/02/2021    7:21 AM 05/03/2019    5:00 PM 04/27/2019    9:59 AM  Advanced Directives  Does Patient Have a Medical Advance Directive? No No No No No No No  Would patient like information on creating a medical advance directive? No - Patient declined Yes (MAU/Ambulatory/Procedural Areas - Information given) Yes (MAU/Ambulatory/Procedural Areas - Information given) Yes (MAU/Ambulatory/Procedural Areas - Information given) No - Patient declined No - Patient declined No - Patient declined    Current Medications (verified) Outpatient Encounter Medications as of 01/13/2023  Medication Sig   acetaminophen (TYLENOL) 500 MG tablet Take 500-1,000 mg by mouth every 6 (six) hours as needed for moderate pain or headache.   amLODipine (NORVASC) 5 MG tablet TAKE 1 TABLET (5 MG TOTAL) BY MOUTH DAILY.   atorvastatin (LIPITOR) 80 MG tablet TAKE 1 TABLET BY MOUTH DAILY AT 6 PM.   budesonide (PULMICORT) 0.25 MG/2ML nebulizer solution USE 1 VIAL IN NEBULIZER TWICE DAILY (Patient taking differently: Take 0.25 mg by nebulization 2 (two) times daily.)   Chlorphen-PE-Acetaminophen (NOREL AD) 4-10-325 MG TABS Take 1 tablet by mouth 3 (three) times daily as needed (Nasal congestion).    clopidogrel (PLAVIX) 75 MG tablet Take 1 tablet (75 mg total) by mouth daily.   ezetimibe (ZETIA) 10 MG tablet Take 1 tablet (10 mg total) by mouth daily.   fluticasone (FLONASE) 50 MCG/ACT nasal spray Place 2 sprays into both nostrils daily.   formoterol (PERFOROMIST) 20 MCG/2ML nebulizer solution TAKE 2 MLS BY NEBULIZATION TWICE DAILY   metoprolol tartrate (LOPRESSOR) 50 MG tablet TAKE 1/2 TABLET TWICE A DAY BY MOUTH   montelukast (SINGULAIR) 10 MG tablet Take 1 tablet (10 mg total) by mouth at bedtime.   pantoprazole (PROTONIX) 40 MG tablet TAKE 1 TABLET BY MOUTH EVERY DAY   telmisartan (MICARDIS) 80 MG tablet TAKE 1 TABLET BY MOUTH EVERY DAY   tiZANidine (ZANAFLEX) 4 MG capsule Take 1 capsule (4 mg total) by mouth 3 (three) times daily.   No facility-administered encounter medications on file as of 01/13/2023.    Allergies (verified) Aspirin, Amoxicillin, and Sulfonamide derivatives   History: Past Medical History:  Diagnosis Date   Allergy    Anginal pain (HCC)    Arthritis    Asthma    Benign prostatic hypertrophy    Coronary artery disease    Diverticulosis    Dyspnea    GERD (gastroesophageal reflux disease)    History of kidney stones    Hyperlipidemia    Hypertension    Nephrolithiasis    Pleural effusion on  right    Pneumonia    PVC's (premature ventricular contractions)    RECTAL BLEEDING 09/15/2010   Qualifier: Diagnosis of  By: Monica Becton PA-c, Amy S    Shingles    twice   Syncope    Past Surgical History:  Procedure Laterality Date   APPENDECTOMY     CORONARY ARTERY BYPASS GRAFT N/A 05/01/2019   Procedure: CORONARY ARTERY BYPASS GRAFTING (CABG) TIMES FOUR USING LEFT AND RIGHT INTERNAL MAMMARY ARTERIES AND RIGHT RADIAL ARTERY WITH STERNAL PLATING;  Surgeon: Linden Dolin, MD;  Location: [facility] OR;  Service: Open Heart Surgery;  Laterality: N/A;   EYE SURGERY Bilateral    catartact surgery   LEFT HEART CATH AND CORONARY ANGIOGRAPHY N/A 04/18/2019    Procedure: LEFT HEART CATH AND CORONARY ANGIOGRAPHY;  Surgeon: Lyn Records, MD;  Location: [facility] INVASIVE CV LAB;  Service: Cardiovascular;  Laterality: N/A;   NASAL SINUS SURGERY  03/28/2011   RADIAL ARTERY HARVEST Right 05/01/2019   Procedure: Right Radial Artery Harvest;  Surgeon: Linden Dolin, MD;  Location: [facility] OR;  Service: Open Heart Surgery;  Laterality: Right;   TEE WITHOUT CARDIOVERSION N/A 05/01/2019   Procedure: TRANSESOPHAGEAL ECHOCARDIOGRAM (TEE);  Surgeon: Linden Dolin, MD;  Location: [facility] OR;  Service: Open Heart Surgery;  Laterality: N/A;   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   Procedure: TOTAL HIP ARTHROPLASTY ANTERIOR APPROACH;  Surgeon: Ollen Gross, MD;  Location: [facility] ORS;  Service: Orthopedics;  Laterality: Left;   Family History  Problem Relation Age of Onset   Heart attack Father    Social History   Socioeconomic History   Marital status: Married    Spouse name: Not on file   Number of children: Not on file   Years of education: Not on file   Highest education level: Not on file  Occupational History   Not on file  Tobacco Use   Smoking status: Former    Packs/day: 2.00    Years: 14.00    Additional pack years: 0.00    Total pack years: 28.00    Types: Cigarettes    Quit date: 07/27/1969    Years since quitting: 53.5   Smokeless tobacco: Never  Vaping Use   Vaping Use: Never used  Substance and Sexual Activity   Alcohol use: Yes    Comment: beer occ   Drug use: Never   Sexual activity: Yes  Other Topics Concern   Not on file  Social History Narrative   Not on file   Social Determinants of Health   Financial Resource Strain: Low Risk  (01/07/2022)   Overall Financial Resource Strain (CARDIA)    Difficulty of Paying Living Expenses: Not hard at all  Food Insecurity: No Food Insecurity (05/27/2022)   Hunger Vital Sign    Worried About Running Out of Food in the Last Year: Never true    Ran Out of Food in the Last Year: Never true   Transportation Needs: No Transportation Needs (05/27/2022)   PRAPARE - Administrator, Civil Service (Medical): No    Lack of Transportation (Non-Medical): No  Physical Activity: Insufficiently Active (01/13/2023)   Exercise Vital Sign    Days of Exercise per Week: 7 days    Minutes of Exercise per Session: 10 min  Stress: No Stress Concern Present (01/07/2022)   [employer] of Occupational Health - Occupational Stress Questionnaire    Feeling of Stress : Not at all  Social Connections: Moderately Integrated (01/07/2022)  Social Advertising account executive [NHANES]    Frequency of Communication with Friends and Family: More than three times a week    Frequency of Social Gatherings with Friends and Family: More than three times a week    Attends Religious Services: More than 4 times per year    Active Member of [employer] or Organizations: No    Attends Engineer, structural: Never    Marital Status: Married    Tobacco Counseling Counseling given: Not Answered   Clinical Intake:  Pre-visit preparation completed: Yes  Pain : No/denies pain Pain Score: 0-No pain        How often do you need to have someone help you when you read instructions, pamphlets, or other written materials from your doctor or pharmacy?: 1 - Never What is the last grade level you completed in school?: 12th grade  Interpreter Needed?: No      Activities of Daily Living    01/13/2023    1:20 PM 05/27/2022    6:32 PM  In your present state of health, do you have any difficulty performing the following activities:  Hearing? 0 1  Vision? 0 0  Difficulty concentrating or making decisions? 0 1  Walking or climbing stairs? 0 1  Dressing or bathing? 0 0  Doing errands, shopping? 0 0  Preparing Food and eating ? N   Using the Toilet? N   In the past six months, have you accidently leaked urine? N   Do you have problems with loss of bowel control? N   Managing your Medications?  N     Patient Care Team: Anabel Halon, MD as PCP - General (Internal Medicine) Rennis Golden Lisette Abu, MD as PCP - Cardiology (Cardiology) Juanell Fairly, RN as [facility] Care Management  Indicate any recent Medical Services you may have received from other than [facility] providers in the past year (date may be approximate).     Assessment:   This is a routine wellness examination for Aline.  Hearing/Vision screen No results found.  Dietary issues and exercise activities discussed:     Goals Addressed             This Visit's Progress    COMPLETED: Patient Stated       Patient would like to have his sinus polyps removed or improved.       Depression Screen    01/13/2023    1:24 PM 01/13/2023    1:07 PM 11/25/2022   11:02 AM 11/05/2022   10:51 AM 10/13/2022    1:43 PM 09/01/2022    2:22 PM 08/03/2022   10:03 AM  PHQ 2/9 Scores  PHQ - 2 Score 0 0 0 0 0 0 0  PHQ- 9 Score    0       Fall Risk    01/13/2023    1:23 PM 01/13/2023    1:07 PM 11/25/2022   11:02 AM 11/05/2022   10:51 AM 10/13/2022    1:43 PM  Fall Risk   Falls in the past year? 0 0 0 0 0  Number falls in past yr:  0 0 0 0  Injury with Fall?  0 0 0 0  Risk for fall due to :    No Fall Risks   Follow up    Falls evaluation completed     MEDICARE RISK AT HOME:  Medicare Risk at Home - 01/13/23 1323     Any stairs in or around the  home? No    If so, are there any without handrails? No    Home free of loose throw rugs in walkways, pet beds, electrical cords, etc? Yes    Adequate lighting in your home to reduce risk of falls? Yes    Life alert? No    Use of a cane, walker or w/c? No    Grab bars in the bathroom? Yes    Shower chair or bench in shower? Yes    Elevated toilet seat or a handicapped toilet? Yes             TIMED UP AND GO:  Was the test performed?  Yes  Length of time to ambulate 10 feet: 8 sec Gait steady and fast without use of assistive device    Cognitive Function:     01/07/2022    1:32 PM  MMSE - Mini Mental State Exam  Not completed: Unable to complete        01/07/2022    1:32 PM  6CIT Screen  What Year? 0 points  What month? 0 points  What time? 0 points  Count back from 20 0 points  Months in reverse 0 points  Repeat phrase 0 points  Total Score 0 points    Immunizations Immunization History  Administered Date(s) Administered   Fluad Quad(high Dose 65+) 05/05/2019   Influenza Split 07/17/2011, 05/10/2012, 04/26/2014   Influenza Whole 05/17/2008, 06/11/2009   Influenza, High Dose Seasonal PF 05/08/2017, 05/05/2018, 05/21/2021   Influenza,inj,Quad PF,6+ Mos 08/14/2013, 04/29/2015   Influenza-Unspecified 04/27/2016, 05/15/2022   PFIZER(Purple Top)SARS-COV-2 Vaccination 10/21/2019, 11/14/2019   Pneumococcal Conjugate-13 08/14/2013   Pneumococcal Polysaccharide-23 06/11/2009   Tdap 08/01/2015   Zoster Recombinat (Shingrix) 11/24/2021, 02/24/2022    TDAP status: Up to date  Flu Vaccine status: Up to date  Pneumococcal vaccine status: Up to date  Covid-19 vaccine status: Information provided on how to obtain vaccines.   Qualifies for Shingles Vaccine? Yes   Zostavax completed No   Shingrix Completed?: Yes  Screening Tests Health Maintenance  Topic Date Due   Hepatitis C Screening  Never done   COVID-19 Vaccine (3 - Pfizer risk series) 12/12/2019   Medicare Annual Wellness (AWV)  01/08/2023   INFLUENZA VACCINE  02/25/2023   DTaP/Tdap/Td (2 - Td or Tdap) 07/31/2025   Pneumonia Vaccine 33+ Years old  Completed   Zoster Vaccines- Shingrix  Completed   HPV VACCINES  Aged Out   Colonoscopy  Discontinued    Health Maintenance  Health Maintenance Due  Topic Date Due   Hepatitis C Screening  Never done   COVID-19 Vaccine (3 - Pfizer risk series) 12/12/2019   Medicare Annual Wellness (AWV)  01/08/2023    Colorectal cancer screening: No longer required.   Lung Cancer Screening: (Low Dose CT Chest recommended if Age 39-80  years, 20 pack-year currently smoking OR have quit w/in 15years.) does not qualify.    Additional Screening:  Hepatitis C Screening: does qualify  Vision Screening: Recommended annual ophthalmology exams for early detection of glaucoma and other disorders of the eye. Is the patient up to date with their annual eye exam?  Yes  Who is the provider or what is the name of the office in which the patient attends annual eye exams? [facility] If pt is not established with a provider, would they like to be referred to a provider to establish care? No .   Dental Screening: Recommended annual dental exams for proper oral hygiene   Community  Resource Referral / Chronic Care Management: CRR required this visit?  No   CCM required this visit?  No     Plan:     I have personally reviewed and noted the following in the patient's chart:   Medical and social history Use of alcohol, tobacco or illicit drugs  Current medications and supplements including opioid prescriptions. Patient is not currently taking opioid prescriptions. Functional ability and status Nutritional status Physical activity Advanced directives List of other physicians Hospitalizations, surgeries, and ER visits in previous 12 months Vitals Screenings to include cognitive, depression, and falls Referrals and appointments  In addition, I have reviewed and discussed with patient certain preventive protocols, quality metrics, and best practice recommendations. A written personalized care plan for preventive services as well as general preventive health recommendations were provided to patient.     Milus Banister, MD   01/13/2023

## 2023-01-13 NOTE — Patient Instructions (Signed)
  Mr. Antonio Carlson , Thank you for taking time to come for your Medicare Wellness Visit. I appreciate your ongoing commitment to your health goals. Please review the following plan we discussed and let me know if I can assist you in the future.   These are the goals we discussed:  Goals   None     This is a list of the screening recommended for you and due dates:  Health Maintenance  Topic Date Due   Hepatitis C Screening  Never done   COVID-19 Vaccine (3 - Pfizer risk series) 12/12/2019   Medicare Annual Wellness Visit  01/08/2023   Flu Shot  02/25/2023   DTaP/Tdap/Td vaccine (2 - Td or Tdap) 07/31/2025   Pneumonia Vaccine  Completed   Zoster (Shingles) Vaccine  Completed   HPV Vaccine  Aged Out   Colon Cancer Screening  Discontinued

## 2023-01-15 ENCOUNTER — Ambulatory Visit: Payer: Medicare Other | Attending: Internal Medicine | Admitting: Internal Medicine

## 2023-01-15 ENCOUNTER — Encounter: Payer: Self-pay | Admitting: Internal Medicine

## 2023-01-15 VITALS — BP 122/68 | HR 69 | Ht 68.0 in | Wt 181.4 lb

## 2023-01-15 DIAGNOSIS — R600 Localized edema: Secondary | ICD-10-CM

## 2023-01-15 DIAGNOSIS — I10 Essential (primary) hypertension: Secondary | ICD-10-CM | POA: Diagnosis not present

## 2023-01-15 DIAGNOSIS — Z951 Presence of aortocoronary bypass graft: Secondary | ICD-10-CM | POA: Diagnosis not present

## 2023-01-15 DIAGNOSIS — R0602 Shortness of breath: Secondary | ICD-10-CM | POA: Diagnosis not present

## 2023-01-15 MED ORDER — SPIRONOLACTONE 25 MG PO TABS: 12.5000 mg | ORAL_TABLET | Freq: Every day | ORAL | 3 refills | Status: DC

## 2023-01-15 MED ORDER — AMLODIPINE BESYLATE 2.5 MG PO TABS
2.5000 mg | ORAL_TABLET | Freq: Every day | ORAL | 3 refills | Status: DC
Start: 2023-01-15 — End: 2023-04-13

## 2023-01-15 NOTE — Progress Notes (Signed)
OFFICE CONSULT NOTE  Chief Complaint:  Follow-up  Primary Care Physician: Anabel Halon, MD  HPI:  Antonio Carlson is a 77 y.o. male who is being seen today for the evaluation of chest pain at the request of Anabel Halon, MD. This is a pleasant 77 year old male patient of Dr. Sherene Sires he is been followed for asthma for some time.  He has a history of hypertension, dyslipidemia and PVCs in the past.  Family history significant for his father who had an MI in his 40s.  He was a previously a smoker but quit in the 1970s.  Since about January has had some exertional burning in his chest.  Is worse with exercise and improves at rest.  It does not radiate.  His symptoms have been fairly stable.  He says it does not always happen with exertion.  It was felt that some of the symptoms were out of proportion for his lung disease.  His dyslipidemia is well managed with total cholesterol 181, triglycerides 138, HDL 51 and LDL 102 on 40 mg simvastatin.  He is not on aspirin due to anaphylactic reaction.  He also has hypertension and is on a beta-blocker.  Heart rate is in the low 60s.  Blood pressures well controlled today.  EKG was performed the office shows sinus rhythm with sinus arrhythmia at 61.  Antonio Carlson is an avid hunter but also notes that when he walks a certain distance he gets pain in both of the buttocks and upper thighs and has to stop.  Once he stops the symptoms get better and then he gets up and goes on again.  He has had x-rays of his hips which showed some mild osteoarthritis but no significant findings to explain his pain.  06/27/2019  Antonio Carlson returns today for follow-up of coronary artery bypass grafting which was in early October.  He had four-vessel CABG with LIMA to LAD, RIMA to PDA, right radial to distal obtuse marginal and ramus intermedius on May 01, 2019.  He did very well except for postoperative A. fib with RVR requiring amiodarone and spontaneously converted to sinus.  He  was placed on Eliquis and amiodarone however he could not afford the Eliquis and discontinued it fairly quickly.  He was seen in follow-up and has since been seen by Dr. Vickey Sages who operated on him.  His anticoagulation was discontinued but he remains on amiodarone.  He was cleared to start driving.  According to Antonio Carlson he is doing fairly well except that he has been having some cough and left sided lower chest discomfort in the back.  Is worse sometimes with change in position or taking deep breaths.  This was brought up but not felt to be significant.  He plans to go to [place] where he stays for typically 3 months.  He would like to do cardiac rehabilitation at hospital there that is about 15 minutes from his house.  He denies any recurrent palpitations or A. fib.  EKG today shows sinus rhythm.  He did have recent labs show total cholesterol 153, triglycerides 142, HDL 45 and LDL of 83.  Previously he was on simvastatin but changed to atorvastatin 40 mg daily.  02/12/2020  Antonio Carlson is seen today in follow-up.  He continues to do well now a ways out from his CABG.  Blood pressure is well controlled.  He has been followed by pulmonary and has had some improvement in his breathing.  His pleural effusions  have resolved.  Cholesterol seems to be reasonably well controlled however LDL was still 102.  This was as of May 2020.  His target LDL is less than 70.  EKG shows sinus rhythm at 65.  03/12/2021  Antonio Carlson returns for follow-up.  Overall he is doing well.  Blood pressure was initially elevated however came down to 130/78.  Weight is up a little bit.  He said he struggling with an issue on his hand.  He denies any worsening shortness of breath.  His cholesterol is little higher than target with a total 155, HDL 42, triglycerides 94 and LDL 94 in June 2022.  He is on high-dose atorvastatin which she says he is compliant with.  01/15/2023  Antonio Carlson is seen today for follow-up.  He saw Irving Burton back in  February.  He is complaining of some worsening lower extremity swelling.  He had some shortness of breath but has a number of issues including chronic lung disease, back pain and recurrent nasal polyps which is caused him some congestion.  He says he can only walk about 50 to 100 yards before he gets short of breath.  He had a repeat echo a few months ago which showed preserved LV systolic function 60 to 65% and mild AI, essentially unchanged to his last echo which was around 2020.  Blood pressure appears well-controlled.  He had lipids in February which showed total cholesterol 133, HDL 61, triglycerides 67 and LDL 58.  PMHx:  Past Medical History:  Diagnosis Date   Allergy    Anginal pain (HCC)    Arthritis    Asthma    Benign prostatic hypertrophy    Coronary artery disease    Diverticulosis    Dyspnea    GERD (gastroesophageal reflux disease)    History of kidney stones    Hyperlipidemia    Hypertension    Nephrolithiasis    Pleural effusion on right    Pneumonia    PVC's (premature ventricular contractions)    RECTAL BLEEDING 09/15/2010   Qualifier: Diagnosis of  By: Monica Becton PA-c, Amy S    Shingles    twice   Syncope     Past Surgical History:  Procedure Laterality Date   APPENDECTOMY     CORONARY ARTERY BYPASS GRAFT N/A 05/01/2019   Procedure: CORONARY ARTERY BYPASS GRAFTING (CABG) TIMES FOUR USING LEFT AND RIGHT INTERNAL MAMMARY ARTERIES AND RIGHT RADIAL ARTERY WITH STERNAL PLATING;  Surgeon: Linden Dolin, MD;  Location: [facility] OR;  Service: Open Heart Surgery;  Laterality: N/A;   EYE SURGERY Bilateral    catartact surgery   LEFT HEART CATH AND CORONARY ANGIOGRAPHY N/A 04/18/2019   Procedure: LEFT HEART CATH AND CORONARY ANGIOGRAPHY;  Surgeon: Lyn Records, MD;  Location: [facility] INVASIVE CV LAB;  Service: Cardiovascular;  Laterality: N/A;   NASAL SINUS SURGERY  03/28/2011   RADIAL ARTERY HARVEST Right 05/01/2019   Procedure: Right Radial Artery Harvest;  Surgeon:  Linden Dolin, MD;  Location: [facility] OR;  Service: Open Heart Surgery;  Laterality: Right;   TEE WITHOUT CARDIOVERSION N/A 05/01/2019   Procedure: TRANSESOPHAGEAL ECHOCARDIOGRAM (TEE);  Surgeon: Linden Dolin, MD;  Location: [facility] OR;  Service: Open Heart Surgery;  Laterality: N/A;   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   Procedure: TOTAL HIP ARTHROPLASTY ANTERIOR APPROACH;  Surgeon: Ollen Gross, MD;  Location: [facility] ORS;  Service: Orthopedics;  Laterality: Left;    FAMHx:  Family History  Problem Relation Age of Onset  Heart attack Father     SOCHx:   reports that he quit smoking about 53 years ago. His smoking use included cigarettes. He has a 28.00 pack-year smoking history. He has never used smokeless tobacco. He reports current alcohol use. He reports that he does not use drugs.  ALLERGIES:  Allergies  Allergen Reactions   Aspirin Shortness Of Breath   Amoxicillin     GI upset Did it involve swelling of the face/tongue/throat, SOB, or low BP? No Did it involve sudden or severe rash/hives, skin peeling, or any reaction on the inside of your mouth or nose? No Did you need to seek medical attention at a hospital or doctor's office? No When did it last happen?    20+ years   If all above answers are "NO", may proceed with cephalosporin use.    Sulfonamide Derivatives Other (See Comments)    Doesn't remember    ROS: Pertinent items noted in HPI and remainder of comprehensive ROS otherwise negative.  HOME MEDS: Current Outpatient Medications on File Prior to Visit  Medication Sig Dispense Refill   acetaminophen (TYLENOL) 500 MG tablet Take 500-1,000 mg by mouth every 6 (six) hours as needed for moderate pain or headache.     amLODipine (NORVASC) 5 MG tablet TAKE 1 TABLET (5 MG TOTAL) BY MOUTH DAILY. 90 tablet 1   atorvastatin (LIPITOR) 80 MG tablet TAKE 1 TABLET BY MOUTH DAILY AT 6 PM. 90 tablet 3   budesonide (PULMICORT) 0.25 MG/2ML nebulizer solution USE 1 VIAL IN  NEBULIZER TWICE DAILY (Patient taking differently: Take 0.25 mg by nebulization 2 (two) times daily.) 120 mL 11   Chlorphen-PE-Acetaminophen (NOREL AD) 4-10-325 MG TABS Take 1 tablet by mouth 3 (three) times daily as needed (Nasal congestion). 30 tablet 3   clopidogrel (PLAVIX) 75 MG tablet Take 1 tablet (75 mg total) by mouth daily. 90 tablet 1   ezetimibe (ZETIA) 10 MG tablet Take 1 tablet (10 mg total) by mouth daily. 90 tablet 3   fluticasone (FLONASE) 50 MCG/ACT nasal spray Place 2 sprays into both nostrils daily. 16 g 6   formoterol (PERFOROMIST) 20 MCG/2ML nebulizer solution TAKE 2 MLS BY NEBULIZATION TWICE DAILY 360 mL 11   metoprolol tartrate (LOPRESSOR) 50 MG tablet TAKE 1/2 TABLET TWICE A DAY BY MOUTH 90 tablet 0   montelukast (SINGULAIR) 10 MG tablet Take 1 tablet (10 mg total) by mouth at bedtime. 90 tablet 4   pantoprazole (PROTONIX) 40 MG tablet TAKE 1 TABLET BY MOUTH EVERY DAY 90 tablet 2   telmisartan (MICARDIS) 80 MG tablet TAKE 1 TABLET BY MOUTH EVERY DAY 90 tablet 3   No current facility-administered medications on file prior to visit.    LABS/IMAGING: No results found for this or any previous visit (from the past 48 hour(s)). No results found.  LIPID PANEL:    Component Value Date/Time   CHOL 133 09/04/2022 0938   TRIG 67 09/04/2022 0938   HDL 61 09/04/2022 0938   CHOLHDL 2.2 09/04/2022 0938   CHOLHDL 4 12/30/2020 1034   VLDL 18.8 12/30/2020 1034   LDLCALC 58 09/04/2022 0938   LDLDIRECT 122.0 08/20/2014 0939    WEIGHTS: Wt Readings from Last 3 Encounters:  01/15/23 181 lb 6.4 oz (82.3 kg)  01/13/23 182 lb (82.6 kg)  11/25/22 184 lb (83.5 kg)    VITALS: BP 122/68 (BP Location: Left Arm, Patient Position: Sitting, Cuff Size: Normal)   Pulse 69   Ht 5\' 8"  (1.727 m)  Wt 181 lb 6.4 oz (82.3 kg)   SpO2 96%   BMI 27.58 kg/m   EXAM: General appearance: alert and no distress Neck: no carotid bruit, no JVD, and thyroid not enlarged, symmetric, no  tenderness/mass/nodules Lungs: diminished breath sounds LLL Heart: regular rate and rhythm, S1, S2 normal, no murmur, click, rub or gallop Abdomen: soft, non-tender; bowel sounds normal; no masses,  no organomegaly Extremities: edema 2+ bilateral LE and varicose veins noted Pulses: normal LE pulses Skin: Skin color, texture, turgor normal. No rashes or lesions Neurologic: Grossly normal Psych: Pleasant  EKG: EKG Interpretation  Date/Time:  Friday January 15 2023 09:01:18 EDT Ventricular Rate:  69 PR Interval:  152 QRS Duration: 92 QT Interval:  406 QTC Calculation: 435 R Axis:   46 Text Interpretation: Normal sinus rhythm Normal ECG When compared with ECG of 01-Aug-2021 20:05, No significant change was found Confirmed by Zoila Shutter [id number]) on 01/15/2023 9:27:08 AM    ASSESSMENT: Progressive DOE Coronary artery disease status post four-vessel CABG (05/01/2019-LIMA to LAD, RIMA to PDA, right radial to distal obtuse marginal, and radial Ms. intermedius of the left circumflex) Dr. Vickey Sages Asthma Hypertension Dyslipidemia Bilateral hip pain Asymptomatic bilateral varicose veins PAF - on Eliquis  PLAN: 1.   Mr. Wales has had some progressive dyspnea on exertion.  His blood pressures remain stable and his echo shows no significant change compared with study 4 years ago.  He does have lower extremity edema which might be related to his amlodipine.  Typically worse with higher doses however I advise cutting it back from 5 to 2.5 mg daily.  Will add spironolactone 12.5 mg daily for diuretic and blood pressure effect.  I encouraged him to stay hydrated we will check a metabolic profile and BNP in about 2 weeks.  Plan follow-up in 1 to 2 months afterwards with me or APP.  Chrystie Nose, MD, [facility], FACP  [place]  [facility]  Medical Director of the [facility]  Card[facility]ovascular Risk Reduction Clinic Diplomate of the American Board of Clinical Lipidology Attending  Cardiologist  Direct Dial: [phone]  Fax: [phone]  Website:  [email or web address] Nicely Destyne Goodreau 01/15/2023, 9:09 AM

## 2023-01-15 NOTE — Patient Instructions (Signed)
Medication Instructions:  DECREASE amlodipine to 2.5mg  daily -- half of the 5mg  tablet - new Rx sent to [facility]  START spironolactone 12.5mg  daily -- new Rx sent to [facility]  *If you need a refill on your cardiac medications before your next appointment, please call your pharmacy*   Lab Work: Non-Fasting BMET and BNP 01/22/23  If you have labs (blood work) drawn today and your tests are completely normal, you will receive your results only by: MyChart Message (if you have MyChart) OR A paper copy in the mail If you have any lab test that is abnormal or we need to change your treatment, we will call you to review the results.   Follow-Up: At [facility], you and your health needs are our priority.  As part of our continuing mission to provide you with exceptional heart care, we have created designated Provider Care Teams.  These Care Teams include your primary Cardiologist (physician) and Advanced Practice Providers (APPs -  Physician Assistants and Nurse Practitioners) who all work together to provide you with the care you need, when you need it.  We recommend signing up for the patient portal called "MyChart".  Sign up information is provided on this After Visit Summary.  MyChart is used to connect with patients for Virtual Visits (Telemedicine).  Patients are able to view lab/test results, encounter notes, upcoming appointments, etc.  Non-urgent messages can be sent to your provider as well.   To learn more about what you can do with MyChart, go to [email or web address].    Your next appointment:    1-2 months with Dr. Rennis Golden or NP/PA

## 2023-01-20 DIAGNOSIS — J329 Chronic sinusitis, unspecified: Secondary | ICD-10-CM | POA: Diagnosis not present

## 2023-01-20 DIAGNOSIS — J339 Nasal polyp, unspecified: Secondary | ICD-10-CM | POA: Diagnosis not present

## 2023-01-22 DIAGNOSIS — I10 Essential (primary) hypertension: Secondary | ICD-10-CM | POA: Diagnosis not present

## 2023-01-22 DIAGNOSIS — R0602 Shortness of breath: Secondary | ICD-10-CM | POA: Diagnosis not present

## 2023-01-23 LAB — BASIC METABOLIC PANEL
BUN/Creatinine Ratio: 17 (ref 10–24)
BUN: 19 mg/dL (ref 8–27)
CO2: 28 mmol/L (ref 20–29)
Calcium: 9.5 mg/dL (ref 8.6–10.2)
Chloride: 101 mmol/L (ref 96–106)
Creatinine, Ser: 1.1 mg/dL (ref 0.76–1.27)
Glucose: 87 mg/dL (ref 70–99)
Potassium: 5.5 mmol/L — ABNORMAL HIGH (ref 3.5–5.2)
Sodium: 140 mmol/L (ref 134–144)
eGFR: 68 mL/min/{1.73_m2} (ref >59)

## 2023-01-23 LAB — BRAIN NATRIURETIC PEPTIDE: BNP: 61.2 pg/mL (ref 0.0–100.0)

## 2023-01-25 ENCOUNTER — Other Ambulatory Visit: Payer: Self-pay | Admitting: *Deleted

## 2023-01-25 DIAGNOSIS — I10 Essential (primary) hypertension: Secondary | ICD-10-CM

## 2023-01-25 DIAGNOSIS — Z79899 Other long term (current) drug therapy: Secondary | ICD-10-CM

## 2023-01-25 MED ORDER — FUROSEMIDE 20 MG PO TABS: 20.0000 mg | ORAL_TABLET | Freq: Every day | ORAL | 5 refills | Status: DC | PRN

## 2023-01-27 ENCOUNTER — Encounter: Payer: Self-pay | Admitting: Internal Medicine

## 2023-01-27 NOTE — Telephone Encounter (Signed)
Error

## 2023-01-29 ENCOUNTER — Other Ambulatory Visit: Payer: Self-pay | Admitting: Internal Medicine

## 2023-02-03 ENCOUNTER — Other Ambulatory Visit: Payer: Self-pay | Admitting: Internal Medicine

## 2023-02-09 ENCOUNTER — Encounter: Payer: Medicare Other | Admitting: Podiatry

## 2023-02-09 NOTE — Progress Notes (Signed)
Patient was a no show for scheduled appt today.

## 2023-02-10 ENCOUNTER — Ambulatory Visit: Payer: Medicare Other | Admitting: Podiatry

## 2023-02-16 ENCOUNTER — Ambulatory Visit: Payer: Medicare Other | Admitting: Internal Medicine

## 2023-02-16 ENCOUNTER — Encounter: Payer: Self-pay | Admitting: Internal Medicine

## 2023-02-16 VITALS — BP 136/70 | HR 64 | Ht 68.0 in | Wt 184.8 lb

## 2023-02-16 DIAGNOSIS — I25118 Atherosclerotic heart disease of native coronary artery with other forms of angina pectoris: Secondary | ICD-10-CM

## 2023-02-16 DIAGNOSIS — I5032 Chronic diastolic (congestive) heart failure: Secondary | ICD-10-CM | POA: Diagnosis not present

## 2023-02-16 DIAGNOSIS — M7989 Other specified soft tissue disorders: Secondary | ICD-10-CM

## 2023-02-16 DIAGNOSIS — J339 Nasal polyp, unspecified: Secondary | ICD-10-CM | POA: Diagnosis not present

## 2023-02-16 DIAGNOSIS — I10 Essential (primary) hypertension: Secondary | ICD-10-CM

## 2023-02-16 NOTE — Assessment & Plan Note (Signed)
Followed by [facility] ENT specialist

## 2023-02-16 NOTE — Assessment & Plan Note (Signed)
Likely from HFpEF and/or amlodipine Advised to perform leg elevation Improved with decreasing dose of amlodipine and adding Lasix

## 2023-02-16 NOTE — Progress Notes (Signed)
Established Patient Office Visit  Subjective:  Patient ID: Antonio Carlson, male    DOB: Jul 15, 1946  Age: 77 y.o. MRN: [id number]  CC:  Chief Complaint  Patient presents with   Hypertension    Four month follow up     HPI Antonio Carlson is a 77 y.o. male with past medical history of HTN, CAD s/p CABG, asthma, allergic rhinitis and OA of hip who presents for f/u of his chronic medical conditions.  HTN: BP is well-controlled. Takes Telmisartan 80 mg QD, Amlodipine 2.5 mg QD and Metoprolol 25 mg BID regularly. Patient denies headache, dizziness, or palpitations.  He was given spironolactone for HFpEF by his Cardiologist, but had to be discontinued due to hyperkalemia.  He has Lasix for as needed use for leg swelling.  CAD s/p CABG: He is on Plavix and statin currently.  He also takes metoprolol for it.  He denies any anginal chest pain or dyspnea currently.  He has history of asthma and allergic rhinitis, for which he uses Perforomist and Pulmicort neb.  He denies any dyspnea or wheezing currently.  He follows up with Dr Antonio Carlson.  He has tried Dupixent, but had an allergic reaction to it.  He denies any fever or chills.  He has chronic sinusitis and nasal polyps, and is planning to get nasal surgery by [facility] ENT specialist.   Past Medical History:  Diagnosis Date   Allergy    Anginal pain (HCC)    Arthritis    Asthma    Benign prostatic hypertrophy    Coronary artery disease    Diverticulosis    Dyspnea    GERD (gastroesophageal reflux disease)    History of kidney stones    Hyperlipidemia    Hypertension    Nephrolithiasis    Pleural effusion on right    Pneumonia    PVC's (premature ventricular contractions)    RECTAL BLEEDING 09/15/2010   Qualifier: Diagnosis of  By: Monica Becton PA-c, Amy S    Shingles    twice   Syncope     Past Surgical History:  Procedure Laterality Date   APPENDECTOMY     CORONARY ARTERY BYPASS GRAFT N/A 05/01/2019   Procedure: CORONARY ARTERY  BYPASS GRAFTING (CABG) TIMES FOUR USING LEFT AND RIGHT INTERNAL MAMMARY ARTERIES AND RIGHT RADIAL ARTERY WITH STERNAL PLATING;  Surgeon: Linden Dolin, MD;  Location: [facility] OR;  Service: Open Heart Surgery;  Laterality: N/A;   EYE SURGERY Bilateral    catartact surgery   LEFT HEART CATH AND CORONARY ANGIOGRAPHY N/A 04/18/2019   Procedure: LEFT HEART CATH AND CORONARY ANGIOGRAPHY;  Surgeon: Lyn Records, MD;  Location: [facility] INVASIVE CV LAB;  Service: Cardiovascular;  Laterality: N/A;   NASAL SINUS SURGERY  03/28/2011   RADIAL ARTERY HARVEST Right 05/01/2019   Procedure: Right Radial Artery Harvest;  Surgeon: Linden Dolin, MD;  Location: [facility] OR;  Service: Open Heart Surgery;  Laterality: Right;   TEE WITHOUT CARDIOVERSION N/A 05/01/2019   Procedure: TRANSESOPHAGEAL ECHOCARDIOGRAM (TEE);  Surgeon: Linden Dolin, MD;  Location: [facility] OR;  Service: Open Heart Surgery;  Laterality: N/A;   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   Procedure: TOTAL HIP ARTHROPLASTY ANTERIOR APPROACH;  Surgeon: Ollen Gross, MD;  Location: [facility] ORS;  Service: Orthopedics;  Laterality: Left;    Family History  Problem Relation Age of Onset   Heart attack Father     Social History   Socioeconomic History   Marital status: Married  Spouse name: Not on file   Number of children: Not on file   Years of education: Not on file   Highest education level: Not on file  Occupational History   Not on file  Tobacco Use   Smoking status: Former    Current packs/day: 0.00    Average packs/day: 2.0 packs/day for 14.0 years (28.0 ttl pk-yrs)    Types: Cigarettes    Start date: 07/28/1955    Quit date: 07/27/1969    Years since quitting: 53.5   Smokeless tobacco: Never  Vaping Use   Vaping status: Never Used  Substance and Sexual Activity   Alcohol use: Yes    Comment: beer occ   Drug use: Never   Sexual activity: Yes  Other Topics Concern   Not on file  Social History Narrative   Not on file   Social  Determinants of Health   Financial Resource Strain: Low Risk  (01/07/2022)   Overall Financial Resource Strain (CARDIA)    Difficulty of Paying Living Expenses: Not hard at all  Food Insecurity: No Food Insecurity (05/27/2022)   Hunger Vital Sign    Worried About Running Out of Food in the Last Year: Never true    Ran Out of Food in the Last Year: Never true  Transportation Needs: No Transportation Needs (05/27/2022)   PRAPARE - Administrator, Civil Service (Medical): No    Lack of Transportation (Non-Medical): No  Physical Activity: Insufficiently Active (01/13/2023)   Exercise Vital Sign    Days of Exercise per Week: 7 days    Minutes of Exercise per Session: 10 min  Stress: No Stress Concern Present (01/07/2022)   [employer] of Occupational Health - Occupational Stress Questionnaire    Feeling of Stress : Not at all  Social Connections: Unknown (02/01/2023)   Received from [facility]   Social Network    Social Network: Not on file  Intimate Partner Violence: Unknown (02/01/2023)   Received from [facility]   HITS    Physically Hurt: Not on file    Insult or Talk Down To: Not on file    Threaten Physical Harm: Not on file    Scream or Curse: Not on file    Outpatient Medications Prior to Visit  Medication Sig Dispense Refill   furosemide (LASIX) 20 MG tablet Take 1 tablet (20 mg total) by mouth daily as needed. 30 tablet 5   acetaminophen (TYLENOL) 500 MG tablet Take 500-1,000 mg by mouth every 6 (six) hours as needed for moderate pain or headache.     amLODipine (NORVASC) 2.5 MG tablet Take 1 tablet (2.5 mg total) by mouth daily. 45 tablet 3   atorvastatin (LIPITOR) 80 MG tablet TAKE 1 TABLET BY MOUTH DAILY AT 6 PM. 90 tablet 3   budesonide (PULMICORT) 0.25 MG/2ML nebulizer solution USE 1 VIAL IN NEBULIZER TWICE DAILY (Patient taking differently: Take 0.25 mg by nebulization 2 (two) times daily.) 120 mL 11   Chlorphen-PE-Acetaminophen (NOREL AD) 4-10-325 MG  TABS Take 1 tablet by mouth 3 (three) times daily as needed (Nasal congestion). 30 tablet 3   clopidogrel (PLAVIX) 75 MG tablet TAKE 1 TABLET BY MOUTH EVERY DAY 90 tablet 3   ezetimibe (ZETIA) 10 MG tablet Take 1 tablet (10 mg total) by mouth daily. 90 tablet 3   fluticasone (FLONASE) 50 MCG/ACT nasal spray Place 2 sprays into both nostrils daily. 16 g 6   formoterol (PERFOROMIST) 20 MCG/2ML nebulizer solution TAKE 2 MLS  BY NEBULIZATION TWICE DAILY 360 mL 11   metoprolol tartrate (LOPRESSOR) 50 MG tablet TAKE 1/2 TABLET TWICE A DAY BY MOUTH 90 tablet 0   montelukast (SINGULAIR) 10 MG tablet Take 1 tablet (10 mg total) by mouth at bedtime. 90 tablet 4   pantoprazole (PROTONIX) 40 MG tablet TAKE 1 TABLET BY MOUTH EVERY DAY 90 tablet 2   telmisartan (MICARDIS) 80 MG tablet TAKE 1 TABLET BY MOUTH EVERY DAY 90 tablet 3   spironolactone (ALDACTONE) 25 MG tablet Take 0.5 tablets (12.5 mg total) by mouth daily. 45 tablet 3   No facility-administered medications prior to visit.    Allergies  Allergen Reactions   Aspirin Shortness Of Breath   Amoxicillin     GI upset Did it involve swelling of the face/tongue/throat, SOB, or low BP? No Did it involve sudden or severe rash/hives, skin peeling, or any reaction on the inside of your mouth or nose? No Did you need to seek medical attention at a hospital or doctor's office? No When did it last happen?    20+ years   If all above answers are "NO", may proceed with cephalosporin use.    Sulfonamide Derivatives Other (See Comments)    Doesn't remember    ROS Review of Systems  Constitutional:  Negative for chills and fever.  HENT:  Positive for congestion, postnasal drip and sinus pain. Negative for sore throat.   Eyes:  Negative for pain and discharge.  Respiratory:  Negative for cough and shortness of breath.   Cardiovascular:  Positive for leg swelling. Negative for chest pain and palpitations.  Gastrointestinal:  Negative for diarrhea, nausea  and vomiting.  Endocrine: Negative for polydipsia and polyuria.  Genitourinary:  Negative for dysuria and hematuria.  Musculoskeletal:  Positive for arthralgias and back pain. Negative for neck pain and neck stiffness.  Skin:  Negative for rash.  Neurological:  Positive for numbness. Negative for dizziness, weakness and headaches.  Psychiatric/Behavioral:  Negative for agitation and behavioral problems.       Objective:    Physical Exam Vitals reviewed.  Constitutional:      General: He is not in acute distress.    Appearance: He is not diaphoretic.  HENT:     Head: Normocephalic and atraumatic.     Nose: Congestion present.     Mouth/Throat:     Mouth: Mucous membranes are moist.  Eyes:     General: No scleral icterus.    Extraocular Movements: Extraocular movements intact.  Cardiovascular:     Rate and Rhythm: Normal rate and regular rhythm.     Pulses: Normal pulses.     Heart sounds: Normal heart sounds. No murmur heard. Pulmonary:     Breath sounds: Normal breath sounds. No wheezing or rales.  Musculoskeletal:     Cervical back: Neck supple. No tenderness.     Right lower leg: No edema.     Left lower leg: No edema.  Skin:    General: Skin is warm.     Findings: No rash.  Neurological:     General: No focal deficit present.     Mental Status: He is alert and oriented to person, place, and time.  Psychiatric:        Mood and Affect: Mood normal.        Behavior: Behavior normal.     BP 136/70 (BP Location: Left Arm)   Pulse 64   Ht 5\' 8"  (1.727 m)   Wt 184 lb 12.8 oz (  83.8 kg)   SpO2 94%   BMI 28.10 kg/m  Wt Readings from Last 3 Encounters:  02/16/23 184 lb 12.8 oz (83.8 kg)  01/15/23 181 lb 6.4 oz (82.3 kg)  01/13/23 182 lb (82.6 kg)    Lab Results  Component Value Date   TSH 1.17 07/01/2021   Lab Results  Component Value Date   WBC 19.9 (H) 05/28/2022   HGB 13.4 05/28/2022   HCT 41.0 05/28/2022   MCV 86.5 05/28/2022   PLT 319 05/28/2022    Lab Results  Component Value Date   NA 140 01/22/2023   K 5.5 (H) 01/22/2023   CO2 28 01/22/2023   GLUCOSE 87 01/22/2023   BUN 19 01/22/2023   CREATININE 1.10 01/22/2023   BILITOT 0.3 09/04/2022   ALKPHOS 132 (H) 09/04/2022   AST 36 09/04/2022   ALT 35 09/04/2022   PROT 6.7 09/04/2022   ALBUMIN 4.6 09/04/2022   CALCIUM 9.5 01/22/2023   ANIONGAP 10 05/28/2022   EGFR 68 01/22/2023   GFR 78.99 07/01/2021   Lab Results  Component Value Date   CHOL 133 09/04/2022   Lab Results  Component Value Date   HDL 61 09/04/2022   Lab Results  Component Value Date   LDLCALC 58 09/04/2022   Lab Results  Component Value Date   TRIG 67 09/04/2022   Lab Results  Component Value Date   CHOLHDL 2.2 09/04/2022   Lab Results  Component Value Date   HGBA1C 5.8 (H) 04/27/2019      Assessment & Plan:   Problem List Items Addressed This Visit       Cardiovascular and Mediastinum   Essential hypertension - Primary (Chronic)    BP Readings from Last 1 Encounters:  02/16/23 136/70   Well-controlled with Telmisartan 80 mg QD, Amlodipine 2.5 mg QD and Metoprolol 25 mg BID Counseled for compliance with the medications Advised DASH diet and moderate exercise/walking as tolerated      Coronary artery disease (Chronic)    Status post CABG On Plavix and metoprolol Denies any anginal pain or dyspnea currently Followed by cardiology      Chronic heart failure with preserved ejection fraction (HCC)    Echo: Grade 1 diastolic dysfunction. On metoprolol, telmisartan and as needed Lasix Had hyperkalemia with spironolactone - recheck BMP Has chronic leg swelling, improved with Lasix        Respiratory   Nasal polyposis    Followed by [facility] ENT specialist        Other   Leg swelling    Likely from HFpEF and/or amlodipine Advised to perform leg elevation Improved with decreasing dose of amlodipine and adding Lasix        No orders of the defined types were placed in  this encounter.   Follow-up: Return in about 4 months (around 06/19/2023) for HTN and CAD.    Anabel Halon, MD

## 2023-02-16 NOTE — Assessment & Plan Note (Signed)
Status post CABG On Plavix and metoprolol Denies any anginal pain or dyspnea currently Followed by cardiology 

## 2023-02-16 NOTE — Assessment & Plan Note (Addendum)
Echo reviewe - Grade 1 diastolic dysfunction. Recent cardiology visit note reviewed On metoprolol, telmisartan and as needed Lasix Had hyperkalemia with spironolactone - recheck BMP Has chronic leg swelling, improved with Lasix

## 2023-02-16 NOTE — Assessment & Plan Note (Addendum)
BP Readings from Last 1 Encounters:  02/16/23 136/70   Well-controlled with Telmisartan 80 mg QD, Amlodipine 2.5 mg QD and Metoprolol 25 mg BID Counseled for compliance with the medications Advised DASH diet and moderate exercise/walking as tolerated

## 2023-02-16 NOTE — Patient Instructions (Addendum)
Please stop taking Spironolactone.  Please continue to take medications as prescribed.  Please continue to follow low salt diet and perform moderate exercise/walking as tolerated.

## 2023-02-17 ENCOUNTER — Other Ambulatory Visit: Payer: Self-pay | Admitting: Internal Medicine

## 2023-02-17 DIAGNOSIS — I10 Essential (primary) hypertension: Secondary | ICD-10-CM

## 2023-02-24 NOTE — Progress Notes (Unsigned)
Subjective:   Patient ID: Antonio Carlson, male    DOB: 1946/05/26   MRN: [id number]  Brief patient profile:  8 yowm quit smoking in 1971 with history of [facility] Ast[facility] and hyperlipidemia and hbp   Brief patient profile:  06/23/2016 NP  Follow up : HTN, Hyperlipidemia/[facility] Ast[facility]  Pt returns for 3 month follow up and med review .  We reviewed all his meds and organized them into a med calendar w/ pt education . He appears to be taking correctly except for flonase , has not been using on regular basis.  Complains of 1 week of nasal congestion , drainage, stuffiness, sneezing, and ear fullness. No fever or sinus pain. Minimal cough .   rec Prednisone taper as directed.  Follow med calendar closely and bring to each visit.  Restart Flonase       11/19/2022  f/u ov/[place] office/Mani Celestin re: triad asthma maint on pulm/performist  > referred to ENT in [place] by PCP - supposed to have prednisone prn as part of action plan but ran out and not refilled  Chief Complaint  Patient presents with   Follow-up    Pt f/u states that his sinuses feel clearer compared to previous OV. He still has some nasal congestion but it is mild  Dyspnea:   Not limited by breathing from desired activities   Cough: minimal / assoc pnd/ clear at  present  Sleeping: level bed / 2 pillows less than before  SABA use: never  02: none  Rec If breathing/ wheezing or nasal congestion worsen > prednisone 10 mg x 2 with breakfast until better then 1 daily x 5 days and stop   02/25/2023  f/u ov/ office/Viren Lebeau re: triad asthma  maint on ***  No chief complaint on file.   Dyspnea:  *** Cough: *** Sleeping: *** SABA use: *** 02: *** Covid status: *** Lung cancer screening: ***   No obvious day to day or daytime variability or assoc excess/ purulent sputum or mucus plugs or hemoptysis or cp or chest tightness, subjective wheeze or overt sinus or hb symptoms.   *** without nocturnal  or early am  exacerbation  of respiratory  c/o's or need for noct saba. Also denies any obvious fluctuation of symptoms with weather or environmental changes or other aggravating or alleviating factors except as outlined above   No unusual exposure hx or h/o childhood pna/ asthma or knowledge of premature birth.  Current Allergies, Complete Past Medical History, Past Surgical History, Family History, and Social History were reviewed in [employer] record.  ROS  The following are not active complaints unless bolded Hoarseness, sore throat, dysphagia, dental problems, itching, sneezing,  nasal congestion or discharge of excess mucus or purulent secretions, ear ache,   fever, chills, sweats, unintended wt loss or wt gain, classically pleuritic or exertional cp,  orthopnea pnd or arm/hand swelling  or leg swelling, presyncope, palpitations, abdominal pain, anorexia, nausea, vomiting, diarrhea  or change in bowel habits or change in bladder habits, change in stools or change in urine, dysuria, hematuria,  rash, arthralgias, visual complaints, headache, numbness, weakness or ataxia or problems with walking or coordination,  change in mood or  memory.        No outpatient medications have been marked as taking for the 02/25/23 encounter (Appointment) with [name], MD.               [facility].  Syncope  - Holter ordered June 27, 2008 > nl  - EP consult June 27, 2008  OSTEOPENIA (ICD-733.90)  COLONIC POLYPS (ICD-211.3) .......................................[name] Kitchen [name] - see colonoscopy 11/07/10 (repeat in 10 yr)  DIVERTICULOSIS, MILD (ICD-562.10) ASTHMA (ICD-493.90)  - HFA 90% June 11, 2009  ALLERGIC RHINITIS, CHRONIC (ICD-477.9)  - steroid dep until mid oct 2009  - Allergy profile sent January 23, 2010 >> IgE 18.3  - Polypectomy Sept 2012 ......................................................  [name]  BENIGN PROSTATIC HYPERTROPHY, HX OF (ICD-V13.8) .[name] Kitchen...  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  [facility]  HEALTH MAINTENANCE.......................................................[name] Kitchen referred to [facility] 09/23/2021  R C6/7 radiculopathy 2015 .....................................................[name] KitchenDutch Quint  - Pneumovax 11/2004 and @ age 89 June 11, 2009 and Prevnar 13 08/14/2013  - Td 07/2005   And  08/01/2015  - 2nd Covid May 2021        Past Surgical History:  Appendectomy  Colonoscopy     Family History:  heart disease in his father onset at age 2 he was a smoker  Ca brain half brother  Half siblings  - one lung ca, one breast ca, one brain ca Mother dementia onset late 63s lived to be 53    Social History:  quit smoking 1971  rarely drink alcohol  Retired  Widower 2018  - remarried sept 2020        Objective:   Physical Exam  Wts  02/25/2023      ***  11/19/2022   184  10/08/2022   179  09/23/2021   175  12/30/2020     172 07/01/2020   177 01/01/2020      175  10/09/2019    170   03/16/2019   179  wt 198 May 17, 2008 >  > 192 07/17/11 >  02/05/2012  191> 05/10/2012  188> 08/10/2012 190> 11/14/2012 180>182 12/12/2012 > 03/14/2013 182 > 07/06/2013 185 > 08/14/2013  184 > 01/31/2014  176 >176 .03/22/2014 >  05/31/2014   181 > 06/11/14  179 > 08/20/2014   184> 02/18/2015  181 > 08/01/2015  178 > 09/16/2015 182  > 12/16/2015   185 > 03/17/2016  185> 08/07/2016   187  > 10/22/2016    183 > 01/22/2017   180> 07/29/2017   177 > 10/28/2017  169 >  06/16/2018  175 > 12/15/2018  182 >    Vital signs reviewed  02/25/2023  - Note at rest 02 sats  ***% on ***   General appearance:    ***

## 2023-02-25 ENCOUNTER — Telehealth: Payer: Self-pay | Admitting: Internal Medicine

## 2023-02-25 ENCOUNTER — Ambulatory Visit (INDEPENDENT_AMBULATORY_CARE_PROVIDER_SITE_OTHER): Payer: Medicare Other | Admitting: Internal Medicine

## 2023-02-25 ENCOUNTER — Other Ambulatory Visit: Payer: Self-pay | Admitting: Internal Medicine

## 2023-02-25 ENCOUNTER — Encounter: Payer: Self-pay | Admitting: Internal Medicine

## 2023-02-25 VITALS — BP 122/71 | HR 65 | Ht 68.0 in | Wt 186.0 lb

## 2023-02-25 DIAGNOSIS — J45909 Unspecified asthma, uncomplicated: Secondary | ICD-10-CM

## 2023-02-25 DIAGNOSIS — Z886 Allergy status to analgesic agent status: Secondary | ICD-10-CM

## 2023-02-25 DIAGNOSIS — J339 Nasal polyp, unspecified: Secondary | ICD-10-CM

## 2023-02-25 MED ORDER — ALBUTEROL SULFATE (2.5 MG/3ML) 0.083% IN NEBU: 2.5000 mg | INHALATION_SOLUTION | RESPIRATORY_TRACT | 12 refills | Status: DC | PRN

## 2023-02-25 NOTE — Patient Instructions (Signed)
No change in your maintenance nebulizers = performist / budesonide or as needed prednisone   Be sure to tell your anesthesiologist you've been on prednisone in the past  Only use your albuterol nebulizer as a rescue medication to be used if you can't catch your breath by resting or doing a relaxed purse lip breathing pattern.  - The less you use it, the better it will work when you need it. - Ok to use up to one vial  every 4 hours if you must but call for immediate appointment if use goes up over your usual need  Please schedule a follow up visit in 3 months but call sooner if needed

## 2023-02-25 NOTE — Telephone Encounter (Signed)
Patient states the pharmacy is needing a new script for albuterol (PROVENTIL) (2.5 MG/3ML) 0.083% nebulizer solution and it needs to have the medicare code on it. Please advise.

## 2023-02-25 NOTE — Assessment & Plan Note (Signed)
Quit smoking 1971  - PFT's  09/16/2015  FEV1 1.81 (61 % ) ratio 56  p 44 % improvement from saba with DLCO  81/81c % corrects to 107  % for alv volume  Done prior to any am meds  - 06/16/2018  After extensive coaching inhaler device,  effectiveness =    90%  - 10/2018 rx prednisone as plan C x 6 days - Allergy profile 10/09/19   >  Eos 0.9 /  IgE  106 - 01/01/2020 submitted paperwork for dupixent> did not go thru > referred to Dr Lucie Leather 07/01/2020  - 12/30/2020 referred again to Dr Kathyrn Lass service  - improved p starting dupixent 07/03/21  but only received 2 infusions the ? Adverse drug effects = arthralgias/ "pnneumonia"  - 11/19/2022 prn prednisone restarted:  prednisone 10 mg x 2 with breakfast until better then 1 daily x 5 days and stop  Poorly controlled asthma assoc with refractory rhinitis/sinusitis in pt with nasal polyps > agree with polypectomy planned but cautioned to take maint nebs am of procedure and let anesthesia know about use of recent prednisone   Filled rx for prn saba neb he may require post op/ advised          Each maintenance medication was reviewed in detail including emphasizing most importantly the difference between maintenance and prns and under what circumstances the prns are to be triggered using an action plan format where appropriate.  Total time for H and P, chart review, counseling, reviewing neb device(s) and generating customized AVS unique to this office visit / same day charting = 21 min

## 2023-02-26 ENCOUNTER — Other Ambulatory Visit: Payer: Self-pay

## 2023-02-26 DIAGNOSIS — J45909 Unspecified asthma, uncomplicated: Secondary | ICD-10-CM

## 2023-02-26 MED ORDER — ALBUTEROL SULFATE (2.5 MG/3ML) 0.083% IN NEBU
2.5000 mg | INHALATION_SOLUTION | RESPIRATORY_TRACT | 12 refills | Status: AC | PRN
Start: 2023-02-26 — End: ?

## 2023-02-26 NOTE — Telephone Encounter (Signed)
Pharmacy sent electronic request for addition of dx codes. Rx updated and sent back to pharmacy. Nothing further needed at this time.

## 2023-03-05 ENCOUNTER — Telehealth: Payer: Self-pay | Admitting: Internal Medicine

## 2023-03-05 ENCOUNTER — Ambulatory Visit: Payer: Medicare Other | Admitting: Nurse Practitioner

## 2023-03-05 ENCOUNTER — Encounter: Payer: Self-pay | Admitting: Nurse Practitioner

## 2023-03-05 VITALS — BP 134/68 | HR 72 | Ht 68.0 in | Wt 185.0 lb

## 2023-03-05 DIAGNOSIS — R600 Localized edema: Secondary | ICD-10-CM | POA: Insufficient documentation

## 2023-03-05 DIAGNOSIS — R0602 Shortness of breath: Secondary | ICD-10-CM | POA: Diagnosis not present

## 2023-03-05 DIAGNOSIS — Z01810 Encounter for preprocedural cardiovascular examination: Secondary | ICD-10-CM | POA: Diagnosis not present

## 2023-03-05 DIAGNOSIS — I480 Paroxysmal atrial fibrillation: Secondary | ICD-10-CM | POA: Diagnosis not present

## 2023-03-05 DIAGNOSIS — I2510 Atherosclerotic heart disease of native coronary artery without angina pectoris: Secondary | ICD-10-CM | POA: Diagnosis not present

## 2023-03-05 DIAGNOSIS — E785 Hyperlipidemia, unspecified: Secondary | ICD-10-CM

## 2023-03-05 DIAGNOSIS — I10 Essential (primary) hypertension: Secondary | ICD-10-CM | POA: Diagnosis not present

## 2023-03-05 DIAGNOSIS — Z79899 Other long term (current) drug therapy: Secondary | ICD-10-CM | POA: Diagnosis not present

## 2023-03-05 LAB — BASIC METABOLIC PANEL WITH GFR
BUN/Creatinine Ratio: 19 (ref 10–24)
BUN: 22 mg/dL (ref 8–27)
CO2: 28 mmol/L (ref 20–29)
Calcium: 9.5 mg/dL (ref 8.6–10.2)
Chloride: 101 mmol/L (ref 96–106)
Creatinine, Ser: 1.16 mg/dL (ref 0.76–1.27)
Glucose: 107 mg/dL — ABNORMAL HIGH (ref 70–99)
Potassium: 4.5 mmol/L (ref 3.5–5.2)
Sodium: 139 mmol/L (ref 134–144)
eGFR: 64 mL/min/{1.73_m2} (ref >59)

## 2023-03-05 NOTE — Patient Instructions (Signed)
Medication Instructions:  Your physician recommends that you continue on your current medications as directed. Please refer to the Current Medication list given to you today.  *If you need a refill on your cardiac medications before your next appointment, please call your pharmacy*   Lab Work: Complete BMET, orders previously placed.   Testing/Procedures: NONE ordered at this time of appointment     Follow-Up: At [facility], you and your health needs are our priority.  As part of our continuing mission to provide you with exceptional heart care, we have created designated Provider Care Teams.  These Care Teams include your primary Cardiologist (physician) and Advanced Practice Providers (APPs -  Physician Assistants and Nurse Practitioners) who all work together to provide you with the care you need, when you need it.  We recommend signing up for the patient portal called "MyChart".  Sign up information is provided on this After Visit Summary.  MyChart is used to connect with patients for Virtual Visits (Telemedicine).  Patients are able to view lab/test results, encounter notes, upcoming appointments, etc.  Non-urgent messages can be sent to your provider as well.   To learn more about what you can do with MyChart, go to [email or web address].    Your next appointment:    Keep follow up   Provider:   Chrystie Nose, MD

## 2023-03-05 NOTE — Telephone Encounter (Signed)
formoterol (PERFOROMIST) 20 MCG/2ML nebulizer solution [[id number]]  Pharmacy: [facility] on [address]   [address] medicare needs diagnostic code

## 2023-03-05 NOTE — Progress Notes (Signed)
Office Visit    Patient Name: Antonio Carlson Date of Encounter: 03/05/2023  Primary Care Provider:  Anabel Halon, MD Primary Cardiologist:  Chrystie Nose, MD  Chief Complaint    77 year old male with a history of CAD s/p CABG x 4 in 08/2018, postop atrial fibrillation, hypertension, hyperlipidemia, osteopenia, GERD, BPH and asthma who presents for follow-up related to CAD, shortness of breath and lower extremity edema.   Past Medical History    Past Medical History:  Diagnosis Date   Allergy    Anginal pain (HCC)    Arthritis    Asthma    Benign prostatic hypertrophy    Coronary artery disease    Diverticulosis    Dyspnea    GERD (gastroesophageal reflux disease)    History of kidney stones    Hyperlipidemia    Hypertension    Nephrolithiasis    Pleural effusion on right    Pneumonia    PVC's (premature ventricular contractions)    RECTAL BLEEDING 09/15/2010   Qualifier: Diagnosis of  By: Monica Becton PA-c, Amy S    Shingles    twice   Syncope    Past Surgical History:  Procedure Laterality Date   APPENDECTOMY     CORONARY ARTERY BYPASS GRAFT N/A 05/01/2019   Procedure: CORONARY ARTERY BYPASS GRAFTING (CABG) TIMES FOUR USING LEFT AND RIGHT INTERNAL MAMMARY ARTERIES AND RIGHT RADIAL ARTERY WITH STERNAL PLATING;  Surgeon: Linden Dolin, MD;  Location: [facility] OR;  Service: Open Heart Surgery;  Laterality: N/A;   EYE SURGERY Bilateral    catartact surgery   LEFT HEART CATH AND CORONARY ANGIOGRAPHY N/A 04/18/2019   Procedure: LEFT HEART CATH AND CORONARY ANGIOGRAPHY;  Surgeon: Lyn Records, MD;  Location: [facility] INVASIVE CV LAB;  Service: Cardiovascular;  Laterality: N/A;   NASAL SINUS SURGERY  03/28/2011   RADIAL ARTERY HARVEST Right 05/01/2019   Procedure: Right Radial Artery Harvest;  Surgeon: Linden Dolin, MD;  Location: [facility] OR;  Service: Open Heart Surgery;  Laterality: Right;   TEE WITHOUT CARDIOVERSION N/A 05/01/2019   Procedure: TRANSESOPHAGEAL  ECHOCARDIOGRAM (TEE);  Surgeon: Linden Dolin, MD;  Location: [facility] OR;  Service: Open Heart Surgery;  Laterality: N/A;   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   Procedure: TOTAL HIP ARTHROPLASTY ANTERIOR APPROACH;  Surgeon: Ollen Gross, MD;  Location: [facility] ORS;  Service: Orthopedics;  Laterality: Left;    Allergies  Allergies  Allergen Reactions   Aspirin Other (See Comments)    Pt states "I can't breath"   Clavulanic Acid     Other Reaction(s): Unknown   Sulfonamide Derivatives Rash    Other Reaction(s): Other  Doesn't remember   Amoxicillin     GI upset Did it involve swelling of the face/tongue/throat, SOB, or low BP? No Did it involve sudden or severe rash/hives, skin peeling, or any reaction on the inside of your mouth or nose? No Did you need to seek medical attention at a hospital or doctor's office? No When did it last happen?    20+ years   If all above answers are "NO", may proceed with cephalosporin use.      Labs/Other Studies Reviewed    The following studies were reviewed today:  Cardiac Studies & Procedures   CARDIAC CATHETERIZATION  CARDIAC CATHETERIZATION 04/18/2019  Narrative  Heavy left coronary system calcification.  Severe three-vessel obstructive coronary disease involving the RCA and circumflex most severely.  Left main and LAD have intermediate stenoses and by coronary CT  FFR the mid to distal LAD territory has a value of 0.8 or less.  Left main with tubular 40 to 50% distal stenosis  LAD larger diameter than left main.  Mid vessel contains eccentric 50 to 70% narrowing in the region of the third diagonal.  Each of 3 moderate to large diagonal branches have disease with 80% first diagonal 80% second diagonal and 50 to 60% third diagonal.  90+ percent proximal stenosis and large ramus intermedius.  90% mid circumflex and 80% third obtuse marginal.  The circumflex system supplies collaterals to the right coronary.  RCA is functionally  occluded and has left to right collaterals.  There is 99% proximal stenosis 80% mid stenosis and diffuse disease proximal to the PDA.  Normal left ventricular function.  Normal LVEDP.  EF estimated to be 60%  RECOMMENDATIONS:   Patient is allergic to aspirin (anaphylaxis).  Needs TCTS consultation for multivessel coronary bypass grafting.  Potential interventional options could be a multi stent strategy however this seems to be a less favorable given the patient's age and otherwise excellent health.  CABG will likely provide symptom control and improve longevity.  Findings Coronary Findings Diagnostic  Dominance: Right  Left Main Mid LM to Dist LM lesion is 50% stenosed.  Left Anterior Descending Prox LAD to Mid LAD lesion is 40% stenosed. Mid LAD lesion is 60% stenosed.  First Diagonal Branch 1st Diag lesion is 70% stenosed.  Second Diagonal Branch 2nd Diag lesion is 80% stenosed.  Third Diagonal Branch 3rd Diag lesion is 50% stenosed.  Ramus Intermedius Vessel is large. Ramus lesion is 90% stenosed.  Left Circumflex Mid Cx lesion is 90% stenosed.  First Obtuse Marginal Branch Vessel is small in size.  Second Obtuse Marginal Branch Vessel is small in size.  Third Obtuse Marginal Branch Vessel is large in size. 3rd Mrg lesion is 70% stenosed.  Right Coronary Artery Ost RCA to Prox RCA lesion is 99% stenosed. Prox RCA to Mid RCA lesion is 75% stenosed.  Right Posterior Atrioventricular Artery Collaterals RPAV filled by collaterals from Dist Cx.  Intervention  No interventions have been documented.     ECHOCARDIOGRAM  ECHOCARDIOGRAM COMPLETE 10/13/2022  Narrative ECHOCARDIOGRAM REPORT    Patient Name:   Antonio Carlson Date of Exam: 10/13/2022 Medical Rec #:  [id number]      Height:       68.0 in Accession #:    [id number]     Weight:       179.8 lb Date of Birth:  Dec 20, 1945      BSA:          1.953 m Patient Age:    77 years       BP:            128/68 mmHg Patient Gender: M              HR:           48 bpm. Exam Location:  [address]  Procedure: 2D Echo, Cardiac Doppler, Color Doppler and 3D Echo  Indications:    Dyspnea R06.00  History:        Patient has prior history of Echocardiogram examinations, most recent 05/01/2019. CAD, Prior CABG, Arrythmias:PVC; Risk Factors:Hypertension and Dyslipidemia.  Sonographer:    Thurman Coyer RDCS Referring Phys: [phone]  C   IMPRESSIONS   1. Left ventricular ejection fraction, by estimation, is 60 to 65%. The left ventricle has normal function. The left ventricle has no regional wall motion  abnormalities. Left ventricular diastolic parameters are consistent with Grade I diastolic dysfunction (impaired relaxation). 2. Right ventricular systolic function is normal. The right ventricular size is normal. 3. Trivial mitral valve regurgitation. 4. The aortic valve is grossly normal. Aortic valve regurgitation is mild. 5. The inferior vena cava is normal in size with greater than 50% respiratory variability, suggesting right atrial pressure of 3 mmHg.  Comparison(s): No significant change from prior study.  FINDINGS Left Ventricle: Left ventricular ejection fraction, by estimation, is 60 to 65%. The left ventricle has normal function. The left ventricle has no regional wall motion abnormalities. The left ventricular internal cavity size was normal in size. There is no left ventricular hypertrophy. Left ventricular diastolic parameters are consistent with Grade I diastolic dysfunction (impaired relaxation).  Right Ventricle: The right ventricular size is normal. Right ventricular systolic function is normal.  Left Atrium: Left atrial size was normal in size.  Right Atrium: Right atrial size was normal in size.  Pericardium: There is no evidence of pericardial effusion.  Mitral Valve: Trivial mitral valve regurgitation.  Tricuspid Valve: Tricuspid valve regurgitation is  not demonstrated.  Aortic Valve: The aortic valve is grossly normal. Aortic valve regurgitation is mild. Aortic regurgitation PHT measures 544 msec.  Pulmonic Valve: Pulmonic valve regurgitation is not visualized.  Aorta: The aortic root and ascending aorta are structurally normal, with no evidence of dilitation.  Venous: The inferior vena cava is normal in size with greater than 50% respiratory variability, suggesting right atrial pressure of 3 mmHg.  IAS/Shunts: No atrial level shunt detected by color flow Doppler.   LEFT VENTRICLE PLAX 2D LVIDd:         4.40 cm   Diastology LVIDs:         3.20 cm   LV e' medial:    7.72 cm/s LV PW:         1.00 cm   LV E/e' medial:  12.2 LV IVS:        0.70 cm   LV e' lateral:   7.51 cm/s LVOT diam:     2.10 cm   LV E/e' lateral: 12.6 LV SV:         77 LV SV Index:   40 LVOT Area:     3.46 cm  3D Volume EF: 3D EF:        57 % LV EDV:       129 ml LV ESV:       56 ml LV SV:        73 ml  RIGHT VENTRICLE RV Basal diam:  4.30 cm RV Mid diam:    4.50 cm RV S prime:     8.49 cm/s TAPSE (M-mode): 1.4 cm  LEFT ATRIUM             Index        RIGHT ATRIUM           Index LA diam:        4.20 cm 2.15 cm/m   RA Area:     16.30 cm LA Vol (A2C):   69.2 ml 35.43 ml/m  RA Volume:   46.30 ml  23.70 ml/m LA Vol (A4C):   47.5 ml 24.32 ml/m LA Biplane Vol: 58.3 ml 29.85 ml/m AORTIC VALVE LVOT Vmax:   96.90 cm/s LVOT Vmean:  61.300 cm/s LVOT VTI:    0.223 m AI PHT:      544 msec  AORTA Ao Root diam: 3.30 cm Ao Asc diam:  3.60 cm  MITRAL VALVE MV Area (PHT): 3.31 cm     SHUNTS MV Decel Time: 229 msec     Systemic VTI:  0.22 m MV E velocity: 94.30 cm/s   Systemic Diam: 2.10 cm MV A velocity: 107.00 cm/s MV E/A ratio:  0.88  [place] signed by Carolan Clines Signature Date/Time: 10/13/2022/11:32:51 AM    Final   TEE  ECHO INTRAOPERATIVE TEE 05/01/2019  Narrative *INTRAOPERATIVE TRANSESOPHAGEAL REPORT  *    Patient Name:   Antonio Carlson Date of Exam: 05/01/2019 Medical Rec #:  [id number]      Height:       68.0 in Accession #:    [id number]     Weight:       177.8 lb Date of Birth:  Mar 18, 1946      BSA:          1.94 m Patient Age:    73 years       BP:           174/99 mmHg Patient Gender: M              HR:           69 bpm. Exam Location:  Anesthesiology  Transesophogeal exam was perform intraoperatively during surgical procedure. Patient was closely monitored under general anesthesia during the entirety of examination.  Indications:     CORONARY ARTERY BYPASS GRAFTING History:         CAD Risk Factors:Dyslipidemia. Performing Phys: [id number] Merri Brunette ATKINS Diagnosing Phys: Kipp Brood MD  Complications: No known complications during this procedure. POST-OP IMPRESSIONS - Left Ventricle: The left ventricle is unchanged from pre-bypass. - Aorta: The aorta appears unchanged from pre-bypass. - Left Atrial Appendage: The left atrial appendage appears unchanged from pre-bypass. - Aortic Valve: The aortic valve appears unchanged from pre-bypass. - Mitral Valve: The mitral valve appears unchanged from pre-bypass. - Tricuspid Valve: The tricuspid valve appears unchanged from pre-bypass.  PRE-OP FINDINGS Left Ventricle: The left ventricle has normal systolic function, with an ejection fraction of 60-65%. The cavity size was normal. There is no increase in left ventricular wall thickness.  Right Ventricle: The right ventricle has normal systolic function. The cavity was normal. There is no increase in right ventricular wall thickness.  Left Atrium: Left atrial size was dilated. The left atrium was moderately enlarged and measured 4.6 cm in the medial-lateral dimension. The left atrial appendage is well visualized and there is no evidence of thrombus present.  Right Atrium: Right atrial size was normal in size. Right atrial pressure is estimated at 10 mmHg.  Interatrial Septum: No  atrial level shunt detected by color flow Doppler. Increased thickness of the atrial septum sparing the fossa ovalis consistent with The interatrial septum appears to be lipomatous.  Pericardium: There is no evidence of pericardial effusion.  Mitral Valve: Mild thickening of the mitral valve leaflets. Mild calcification of the mitral valve leaflet. Mitral valve regurgitation is mild by color flow Doppler. The MR jet is centrally-directed. There is no evidence of mitral valve vegetation.  Tricuspid Valve: The tricuspid valve was normal in structure. Tricuspid valve regurgitation is trivial by color flow Doppler. The jet is directed centrally.  Aortic Valve: The aortic valve is tricuspid There is Mild thickening of the aortic valve Aortic valve regurgitation is mild by color flow Doppler. The jet is centrally-directed. There is no stenosis of the aortic valve. There is no evidence of a vegetation on the aortic valve.  Pulmonic Valve: The pulmonic valve was normal in structure, with normal. No evidence of pumonic stenosis. Pulmonic valve regurgitation is trivial by color flow Doppler.   Aorta: There was well defined aortic root and sinotubular junction without dilatation or effacement. There was intimal thickening and moderate calcification noted in the right sinus of valsalva and in the anterior wall of the ascending aorta. Diameters: aortic root at sinuses of valsalva: 3.06 cm. sinotubular junction: 2.58 cm Proximal ascending aorta: 2.70 cm Descending aorta: 2.44 cm.    Kipp Brood MD Electronically signed by Kipp Brood MD Signature Date/Time: 05/01/2019/8:16:01 PM    Final    CT SCANS  CT CORONARY MORPH W/CTA COR W/SCORE 04/07/2019  Addendum 04/07/2019  5:31 PM ADDENDUM REPORT: 04/07/2019 17:29  HISTORY: Chest pain, cardiac etiology suspected chest pain  EXAM: Cardiac/Coronary CT  TECHNIQUE: The patient was scanned on a [employer] scanner.  PROTOCOL: A 120 kV  prospective scan was triggered in the descending thoracic aorta at 111 HU's. Axial non-contrast 3 mm slices were carried out through the heart. The data set was analyzed on a dedicated work station and scored using the Agatson method. Gantry rotation speed was 250 msecs and collimation was 0.6 mm. Beta blockade and 0.8 mg of sl NTG was given. The 3D data set was reconstructed in 5% intervals of 35-75% of the R-R cycle. Diastolic phases were analyzed on a dedicated work station using MPR, MIP and VRT modes. The patient received 80mL OMNIPAQUE IOHEXOL 350 MG/ML SOLN of contrast.  FINDINGS: Coronary calcium score: The patient's coronary artery calcium score is 1187, which places the patient in the 81 percentile.  Coronary arteries: Normal coronary origins.  Right dominance.  Right Coronary Artery: Large, dominant vessel. At the ostium there is a noncalcified plaque with 50-69% stenosis. The proximal RCA has a large amount of mixed noncalcified and calcified plaque. There is an area of noncalcified plaque with >70% stenosis at the transition of the proximal to mid RCA. There appear to be multiple areas of noncalcified plaque in the RCA that protrude into the lumen.  Left Main Coronary Artery: Ostial mainly noncalcified plaque with scattered calcification present, with 25-49% stenosis. There is significant narrowing with mixed plaque at the trifurcation of the LAD, Ramus, and LCx with at least 50-69% stenosis but concerning for >70% stenosis. Small islands of noncalcified plaque appear to extend into lumen of left main.  Left Anterior Descending Coronary Artery: Gives rise to three medium size diagonals. Proximal LAD with mixed calcified and noncalcified plaque with 50-69% stenosis, lesion length approximately 24 mm. Intermittent calcification and mild noncalcified plaque seen in mid to distal LAD without focal stenosis.  Ramus Intermedius: Moderate size. Small amounts of  noncalcified plaque without significant stenosis.  Left Circumflex Artery: Nondominant. Gives rise to OM1. Calcified plaque in proximal LCx with 1-24% stenosis. The mid LCx is not well opacified in multiple R-R interval views, unable to evaluate. The distal LCx is small with scattered calcified plaque  Aorta: Normal size, 32 mm at the mid ascending aorta (level of the PA bifurcation) measured double oblique. Multiple areas of calcifications. No dissection.  Aortic Valve: Moderate calcifications. Trileaflet.  Other findings:  Normal pulmonary vein drainage into the left atrium.  Normal left atrial appendage without a thrombus.  Normal appearance of the pulmonary artery, unable to measure fully due to slice locations.  IMPRESSION: 1. Severe multivessel CAD, CADRADS = 4. CT FFR will be performed and reported separately.  2. Coronary calcium score of  1187. This was 54 percentile for age and sex matched control.  3. Normal coronary origin with right dominance.  4.  Aortic valve and aortic calcification.   Electronically Signed By: Jodelle Red M.D. On: 04/07/2019 17:29  Narrative EXAM: OVER-READ INTERPRETATION  CT CHEST  The following report is an over-read performed by radiologist Dr. Signa Kell of [facility] on 04/07/2019. This over-read does not include interpretation of cardiac or coronary anatomy or pathology. The coronary calcium score/coronary CTA interpretation by the cardiologist is attached.  COMPARISON:  CT angio chest 05/10/2008  FINDINGS: Vascular: No significant vascular findings. Normal heart size. No pericardial effusion.  Mediastinum/Nodes: No enlarged lymph nodes or mass identified.  Lungs/Pleura: No pleural effusion identified. No airspace consolidation, atelectasis or pneumothorax. In the left lower lobe there is a pulmonary nodule measuring 1.2 cm, image 34/13. This was present on exam from 05/10/2008 when it  measured 1 x 0.8 cm. Favor benign indolent process.  Upper Abdomen: No acute abnormality.  Musculoskeletal: No acute or significant osseous abnormality.  IMPRESSION: 1. Left lower lobe pulmonary nodule measures 1.2 cm. This is been present since 2009 and is only minimally increased in size in the interval favoring a benign indolent process such as noncalcified granuloma or hamartoma.  Electronically Signed: By: Signa Kell M.D. On: 04/07/2019 10:57         Recent Labs: 05/28/2022: Hemoglobin 13.4; Platelets 319 09/04/2022: ALT 35 01/22/2023: BNP 61.2; BUN 19; Creatinine, Ser 1.10; Potassium 5.5; Sodium 140  Recent Lipid Panel    Component Value Date/Time   CHOL 133 09/04/2022 0938   TRIG 67 09/04/2022 0938   HDL 61 09/04/2022 0938   CHOLHDL 2.2 09/04/2022 0938   CHOLHDL 4 12/30/2020 1034   VLDL 18.8 12/30/2020 1034   LDLCALC 58 09/04/2022 0938   LDLDIRECT 122.0 08/20/2014 0939    History of Present Illness    77 year old male with the above past medical history including CAD s/p CABG x 4 in 08/2018, postop atrial fibrillation, hypertension, hyperlipidemia, osteopenia, GERD, BPH and asthma.   He has a history of abnormal coronary CT angiogram. Cardiac catheterization 03/2019 revealed multivessel disease.  He underwent CABG x 4 (LIMA-LAD, RIMA hyperlipidemia, right radial-distal obtuse marginal, and RI) in 04/2019.  He did have postop atrial fibrillation.  Most recent echocardiogram in 09/2022 in the setting of increased dyspnea on exertion showed EF 60 to 65%, normal LV function, no RWMA, G1 DD, normal RV, mild aortic valve regurgitation, no significant change from prior study.  He was last seen in the office on 01/15/2023 and was stable from a cardiac standpoint.  He noted increased bilateral lower extremity edema.  Amlodipine was decreased to 2.5 mg daily.  He was started on spironolactone 12.5 mg daily, however, this was discontinued in the setting of hyperkalemia.  He was started  on Lasix 20 mg daily as needed.   He presents today for follow-up. Since his last visit he has done well from a cardiac standpoint.  His swelling has been well-controlled with as needed Lasix.  He denies any dyspnea, PND, orthopnea, weight gain.  He denies symptoms concerning for angina.  Notes that he will be having sinus surgery next week.  He was told he needs to hold his Plavix prior to the surgery.  Overall, he reports feeling well.  Home Medications    Current Outpatient Medications  Medication Sig Dispense Refill   acetaminophen (TYLENOL) 500 MG tablet Take 500-1,000 mg by mouth every 6 (six) hours  as needed for moderate pain or headache.     albuterol (PROVENTIL) (2.5 MG/3ML) 0.083% nebulizer solution Take 3 mLs (2.5 mg total) by nebulization every 4 (four) hours as needed for wheezing or shortness of breath. 75 mL 12   amLODipine (NORVASC) 2.5 MG tablet Take 1 tablet (2.5 mg total) by mouth daily. 45 tablet 3   atorvastatin (LIPITOR) 80 MG tablet TAKE 1 TABLET BY MOUTH DAILY AT 6 PM. 90 tablet 3   budesonide (PULMICORT) 0.5 MG/2ML nebulizer solution Take 0.5 mg by nebulization daily.     clopidogrel (PLAVIX) 75 MG tablet TAKE 1 TABLET BY MOUTH EVERY DAY 90 tablet 3   ezetimibe (ZETIA) 10 MG tablet Take 1 tablet (10 mg total) by mouth daily. 90 tablet 3   fluticasone (FLONASE) 50 MCG/ACT nasal spray Place 2 sprays into both nostrils daily. 16 g 6   formoterol (PERFOROMIST) 20 MCG/2ML nebulizer solution TAKE 2 MLS BY NEBULIZATION TWICE DAILY 360 mL 11   furosemide (LASIX) 20 MG tablet Take 1 tablet (20 mg total) by mouth daily as needed. 30 tablet 5   metoprolol tartrate (LOPRESSOR) 50 MG tablet TAKE 1/2 TABLET TWICE A DAY BY MOUTH 90 tablet 0   montelukast (SINGULAIR) 10 MG tablet Take 1 tablet (10 mg total) by mouth at bedtime. 90 tablet 4   pantoprazole (PROTONIX) 40 MG tablet TAKE 1 TABLET BY MOUTH EVERY DAY 90 tablet 2   predniSONE (DELTASONE) 10 MG tablet Take 10 mg by mouth daily  with breakfast. TAKES PRN     telmisartan (MICARDIS) 80 MG tablet TAKE 1 TABLET BY MOUTH EVERY DAY 90 tablet 3   No current facility-administered medications for this visit.     Review of Systems    He denies chest pain, palpitations, dyspnea, pnd, orthopnea, n, v, dizziness, syncope, edema, weight gain, or early satiety. All other systems reviewed and are otherwise negative except as noted above.   Physical Exam    VS:  BP 134/68   Pulse 72   Ht 5\' 8"  (1.727 m)   Wt 185 lb (83.9 kg)   BMI 28.13 kg/m  GEN: Well nourished, well developed, in no acute distress. HEENT: normal. Neck: Supple, no JVD, carotid bruits, or masses. Cardiac: RRR, no murmurs, rubs, or gallops. No clubbing, cyanosis, edema.  Radials/DP/PT 2+ and equal bilaterally.  Respiratory:  Respirations regular and unlabored, clear to auscultation bilaterally. GI: Soft, nontender, nondistended, BS + x 4. MS: no deformity or atrophy. Skin: warm and dry, no rash. Neuro:  Strength and sensation are intact. Psych: Normal affect.  Accessory Clinical Findings    ECG personally reviewed by me today -    - no EKG in office today.   Lab Results  Component Value Date   WBC 19.9 (H) 05/28/2022   HGB 13.4 05/28/2022   HCT 41.0 05/28/2022   MCV 86.5 05/28/2022   PLT 319 05/28/2022   Lab Results  Component Value Date   CREATININE 1.10 01/22/2023   BUN 19 01/22/2023   NA 140 01/22/2023   K 5.5 (H) 01/22/2023   CL 101 01/22/2023   CO2 28 01/22/2023   Lab Results  Component Value Date   ALT 35 09/04/2022   AST 36 09/04/2022   ALKPHOS 132 (H) 09/04/2022   BILITOT 0.3 09/04/2022   Lab Results  Component Value Date   CHOL 133 09/04/2022   HDL 61 09/04/2022   LDLCALC 58 09/04/2022   LDLDIRECT 122.0 08/20/2014   TRIG 67 09/04/2022  CHOLHDL 2.2 09/04/2022    Lab Results  Component Value Date   HGBA1C 5.8 (H) 04/27/2019    Assessment & Plan    1. Shortness of breath/bilateral lower extremity edema: Most  recent echocardiogram in 09/2022 in the setting of increased dyspnea on exertion showed EF 60 to 65%, normal LV function, no RWMA, G1 DD, normal RV, mild aortic valve regurgitation, no significant change from prior study. Swelling improved with decreased amlodipine, as needed Lasix.   He did not tolerate spironolactone due to hyperkalemia.  Euvolemic and well compensated on exam, denies dyspnea. Will repeat BMET today.  Continue Lasix as needed.  2. CAD: S/p CABG x 4 in 08/2018.  Stable with no anginal symptoms.  No indication for ischemic evaluation.  Continue Plavix, amlodipine, metoprolol, telmisartan, Lipitor, and Zetia.   3. Postop atrial fibrillation: Maintaining sinus rhythm. Not on anticoagulation.  Denies any recent palpitations.  Continue metoprolol.   4. Hypertension: BP well controlled. Continue current antihypertensive regimen.    5. Hyperlipidemia: LDL was 58 in 08/2022. Continue atorvastatin, Zetia.   6. Preoperative cardiac exam: Patient states he is scheduled for sinus surgery next week.  According to the Revised Cardiac Risk Index (RCRI), his Perioperative Risk of Major Cardiac Event is (%): 0.9. His Functional Capacity in METs is: 7.25 according to the [facility] Activity Status Index (DASI).Therefore, based on ACC/AHA guidelines, patient would be at acceptable risk for the planned procedure without further cardiovascular testing. Per office protocol, he may hold Plavix for 5 days prior to procedure. Please resume Plavix as soon as possible postprocedure, at the discretion of the surgeon.   7. Disposition: Follow-up as scheduled with Dr. Rennis Golden in 04/2023.       Joylene Grapes, NP 03/05/2023, 11:04 AM

## 2023-03-08 ENCOUNTER — Ambulatory Visit: Payer: Medicare Other | Admitting: Podiatry

## 2023-03-09 NOTE — Telephone Encounter (Signed)
Patient is calling for medication refill. Patient states pharm needs diagnostic code for .formoterol . He will be out of medication in a few days.

## 2023-03-10 ENCOUNTER — Ambulatory Visit: Payer: Medicare Other | Admitting: Podiatry

## 2023-03-10 ENCOUNTER — Encounter: Payer: Self-pay | Admitting: Podiatry

## 2023-03-10 DIAGNOSIS — M79674 Pain in right toe(s): Secondary | ICD-10-CM

## 2023-03-10 DIAGNOSIS — M79675 Pain in left toe(s): Secondary | ICD-10-CM

## 2023-03-10 DIAGNOSIS — J3489 Other specified disorders of nose and nasal sinuses: Secondary | ICD-10-CM | POA: Diagnosis not present

## 2023-03-10 DIAGNOSIS — B351 Tinea unguium: Secondary | ICD-10-CM

## 2023-03-10 DIAGNOSIS — J45909 Unspecified asthma, uncomplicated: Secondary | ICD-10-CM | POA: Diagnosis not present

## 2023-03-10 DIAGNOSIS — R430 Anosmia: Secondary | ICD-10-CM | POA: Diagnosis not present

## 2023-03-10 NOTE — Telephone Encounter (Signed)
Called [facility] and provided dx codes for [facility]. Tech states ins is requiring a CMN. She is faxing to office.

## 2023-03-10 NOTE — Progress Notes (Signed)
  Subjective:  Patient ID: Antonio Carlson, male    DOB: April 23, 1946,  MRN: [id number]  No chief complaint on file.   77 y.o. male presents concern of thickened elongated and painful nails that are difficult to trim. Requesting to have them trimmed today.    Objective:  Physical Exam: warm, good capillary refill, no trophic changes or ulcerative lesions, normal DP and PT pulses, and normal sensory exam. Left Foot: dystrophic yellowed discolored nail plates with subungual debris Right Foot: dystrophic yellowed discolored nail plates with subungual debris  Assessment:   1. Pain due to onychomycosis of toenails of both feet       Plan:  Patient was evaluated and treated and all questions answered. -Mechanically debrided all nails 1-5 bilateral using sterile nail nipper and filed with dremel without incident  -Answered all patient questions -Patient to return  in 3 months for at risk foot care -Patient advised to call the office if any problems or questions arise in the meantime.     No follow-ups on file.

## 2023-03-11 NOTE — Telephone Encounter (Signed)
RX Needs CMN Medicare Form Formoterol 

## 2023-03-12 ENCOUNTER — Telehealth: Payer: Self-pay | Admitting: Internal Medicine

## 2023-03-12 DIAGNOSIS — Z79899 Other long term (current) drug therapy: Secondary | ICD-10-CM | POA: Diagnosis not present

## 2023-03-12 DIAGNOSIS — K219 Gastro-esophageal reflux disease without esophagitis: Secondary | ICD-10-CM | POA: Diagnosis not present

## 2023-03-12 DIAGNOSIS — J343 Hypertrophy of nasal turbinates: Secondary | ICD-10-CM | POA: Diagnosis not present

## 2023-03-12 DIAGNOSIS — R430 Anosmia: Secondary | ICD-10-CM | POA: Diagnosis not present

## 2023-03-12 DIAGNOSIS — I2510 Atherosclerotic heart disease of native coronary artery without angina pectoris: Secondary | ICD-10-CM | POA: Diagnosis not present

## 2023-03-12 DIAGNOSIS — Z880 Allergy status to penicillin: Secondary | ICD-10-CM | POA: Diagnosis not present

## 2023-03-12 DIAGNOSIS — J338 Other polyp of sinus: Secondary | ICD-10-CM | POA: Diagnosis not present

## 2023-03-12 DIAGNOSIS — J45909 Unspecified asthma, uncomplicated: Secondary | ICD-10-CM | POA: Diagnosis not present

## 2023-03-12 DIAGNOSIS — J324 Chronic pansinusitis: Secondary | ICD-10-CM | POA: Diagnosis not present

## 2023-03-12 DIAGNOSIS — J3489 Other specified disorders of nose and nasal sinuses: Secondary | ICD-10-CM | POA: Diagnosis not present

## 2023-03-12 DIAGNOSIS — J321 Chronic frontal sinusitis: Secondary | ICD-10-CM | POA: Diagnosis not present

## 2023-03-12 DIAGNOSIS — I10 Essential (primary) hypertension: Secondary | ICD-10-CM | POA: Diagnosis not present

## 2023-03-12 DIAGNOSIS — J329 Chronic sinusitis, unspecified: Secondary | ICD-10-CM | POA: Diagnosis not present

## 2023-03-12 DIAGNOSIS — Z7902 Long term (current) use of antithrombotics/antiplatelets: Secondary | ICD-10-CM | POA: Diagnosis not present

## 2023-03-12 DIAGNOSIS — M1990 Unspecified osteoarthritis, unspecified site: Secondary | ICD-10-CM | POA: Diagnosis not present

## 2023-03-12 DIAGNOSIS — I351 Nonrheumatic aortic (valve) insufficiency: Secondary | ICD-10-CM | POA: Diagnosis not present

## 2023-03-12 DIAGNOSIS — Z886 Allergy status to analgesic agent status: Secondary | ICD-10-CM | POA: Diagnosis not present

## 2023-03-12 DIAGNOSIS — E785 Hyperlipidemia, unspecified: Secondary | ICD-10-CM | POA: Diagnosis not present

## 2023-03-12 DIAGNOSIS — J320 Chronic maxillary sinusitis: Secondary | ICD-10-CM | POA: Diagnosis not present

## 2023-03-12 DIAGNOSIS — Z7951 Long term (current) use of inhaled steroids: Secondary | ICD-10-CM | POA: Diagnosis not present

## 2023-03-12 DIAGNOSIS — Z955 Presence of coronary angioplasty implant and graft: Secondary | ICD-10-CM | POA: Diagnosis not present

## 2023-03-12 DIAGNOSIS — J322 Chronic ethmoidal sinusitis: Secondary | ICD-10-CM | POA: Diagnosis not present

## 2023-03-12 DIAGNOSIS — Z87891 Personal history of nicotine dependence: Secondary | ICD-10-CM | POA: Diagnosis not present

## 2023-03-12 DIAGNOSIS — I480 Paroxysmal atrial fibrillation: Secondary | ICD-10-CM | POA: Diagnosis not present

## 2023-03-12 HISTORY — PX: NASAL SINUS SURGERY: SHX719

## 2023-03-12 NOTE — Telephone Encounter (Signed)
Pt. Calling back about getting his meds the encounter prior got closed and signed and needs the CMN signed by Dr. Sherene Sires for Medicare please advise he has called several times about this issue(look at encounter from 8.9.24)

## 2023-03-15 DIAGNOSIS — J329 Chronic sinusitis, unspecified: Secondary | ICD-10-CM | POA: Diagnosis not present

## 2023-03-16 ENCOUNTER — Other Ambulatory Visit: Payer: Self-pay

## 2023-03-16 ENCOUNTER — Telehealth: Payer: Self-pay | Admitting: Internal Medicine

## 2023-03-16 DIAGNOSIS — E782 Mixed hyperlipidemia: Secondary | ICD-10-CM

## 2023-03-16 MED ORDER — EZETIMIBE 10 MG PO TABS
10.0000 mg | ORAL_TABLET | Freq: Every day | ORAL | 3 refills | Status: DC
Start: 2023-03-16 — End: 2024-03-13

## 2023-03-16 NOTE — Telephone Encounter (Signed)
Spoke with [employer]  They needs CMN signed for nebs  I gave them the number to [place] office so they can send form there, as Dr Sherene Sires is there all week  Sheena- please have Dr. Sherene Sires sign this asap, thanks!

## 2023-03-16 NOTE — Telephone Encounter (Signed)
Patient calling says [facility] has been unable to reach [place] regarding a refill for his ezetimibe (ZETIA) 10 MG tablet [[id number]] Please advise- patient requesting a call with any updates Thank you

## 2023-03-16 NOTE — Telephone Encounter (Signed)
Spoke with pt- [facility] on [address]

## 2023-03-16 NOTE — Telephone Encounter (Signed)
Refills sent to [facility] 

## 2023-03-16 NOTE — Telephone Encounter (Signed)
Lmtrc-need to know which [facility]- pharmacy is not on patients list of choice

## 2023-03-17 ENCOUNTER — Telehealth: Payer: Self-pay | Admitting: Internal Medicine

## 2023-03-17 NOTE — Telephone Encounter (Signed)
I've signed it so let him know it's in progress in meantime can substitute albuterol either in inhaler or neb up to every 4 hours as needed

## 2023-03-17 NOTE — Telephone Encounter (Signed)
Dr. Sherene Sires to sign today and get to pharmacy

## 2023-03-17 NOTE — Telephone Encounter (Signed)
Pharmacy calling with questions about   formoterol (PERFOROMIST) 20 MCG/2ML nebulizer solution

## 2023-03-17 NOTE — Telephone Encounter (Signed)
CMN received, signed and faxed back to pharmacy. Nothing further needed at this time.

## 2023-03-18 DIAGNOSIS — J45909 Unspecified asthma, uncomplicated: Secondary | ICD-10-CM | POA: Diagnosis not present

## 2023-03-18 DIAGNOSIS — R430 Anosmia: Secondary | ICD-10-CM | POA: Diagnosis not present

## 2023-03-18 DIAGNOSIS — J339 Nasal polyp, unspecified: Secondary | ICD-10-CM | POA: Diagnosis not present

## 2023-03-19 ENCOUNTER — Other Ambulatory Visit: Payer: Self-pay

## 2023-03-19 ENCOUNTER — Other Ambulatory Visit: Payer: Self-pay | Admitting: Internal Medicine

## 2023-03-19 MED ORDER — FORMOTEROL FUMARATE 20 MCG/2ML IN NEBU: 2.0000 mL | INHALATION_SOLUTION | Freq: Two times a day (BID) | RESPIRATORY_TRACT | 11 refills | Status: DC

## 2023-03-19 NOTE — Telephone Encounter (Signed)
Spoke with patient. He is very frustrated about not receiving medication yet. He has requesting script be sent to another pharmacy. I verified where he wanted medication sent. New script sent. Closing encounter. NFn

## 2023-03-19 NOTE — Telephone Encounter (Signed)
Pt calling in to see about his medication, says he hasn't heard anything from them.  Its a Part B medicine and is supposed to go through [facility]

## 2023-03-22 ENCOUNTER — Other Ambulatory Visit: Payer: Self-pay

## 2023-03-22 DIAGNOSIS — Z886 Allergy status to analgesic agent status: Secondary | ICD-10-CM

## 2023-03-22 MED ORDER — FORMOTEROL FUMARATE 20 MCG/2ML IN NEBU
2.0000 mL | INHALATION_SOLUTION | Freq: Two times a day (BID) | RESPIRATORY_TRACT | 11 refills | Status: DC
Start: 2023-03-22 — End: 2024-03-21

## 2023-03-24 DIAGNOSIS — J329 Chronic sinusitis, unspecified: Secondary | ICD-10-CM | POA: Diagnosis not present

## 2023-03-24 DIAGNOSIS — R430 Anosmia: Secondary | ICD-10-CM | POA: Diagnosis not present

## 2023-03-24 DIAGNOSIS — J45909 Unspecified asthma, uncomplicated: Secondary | ICD-10-CM | POA: Diagnosis not present

## 2023-03-24 DIAGNOSIS — J3489 Other specified disorders of nose and nasal sinuses: Secondary | ICD-10-CM | POA: Diagnosis not present

## 2023-03-25 ENCOUNTER — Other Ambulatory Visit: Payer: Self-pay | Admitting: Internal Medicine

## 2023-04-13 ENCOUNTER — Telehealth: Payer: Self-pay | Admitting: Internal Medicine

## 2023-04-13 ENCOUNTER — Other Ambulatory Visit: Payer: Self-pay | Admitting: Internal Medicine

## 2023-04-13 DIAGNOSIS — I10 Essential (primary) hypertension: Secondary | ICD-10-CM

## 2023-04-13 NOTE — Telephone Encounter (Signed)
Pt c/o medication issue:  1. Name of Medication: spironolactone (ALDACTONE) 25 MG tablet  2. How are you currently taking this medication (dosage and times per day)?   3. Are you having a reaction (difficulty breathing--STAT)? No  4. What is your medication issue? Patient is calling with questions about this medication.

## 2023-04-13 NOTE — Telephone Encounter (Signed)
Spoke to patient he stated he picked up Spironolactone at pharmacy this afternoon.Stated he does not take Spironolactone.Stated he does not remember ever taking.Advised I do not see Spironolactone listed in chart.I will send message to Dr.Hilty.

## 2023-04-14 NOTE — Telephone Encounter (Signed)
Per chart review, spironolactone was Rx'ed 01/15/23 but then stopped d/t hyperkalemia 01/2023.

## 2023-04-14 NOTE — Telephone Encounter (Signed)
Called patient. Advised him of the info below. He will NOT take spironolactone as previously advised. Lasix PRN is used for edema.

## 2023-04-19 ENCOUNTER — Emergency Department (HOSPITAL_COMMUNITY)
Admission: EM | Admit: 2023-04-19 | Discharge: 2023-04-19 | Disposition: A | Discharge status: Home / Self Care | Payer: Medicare Other | Attending: Emergency Medicine | Admitting: Emergency Medicine

## 2023-04-19 ENCOUNTER — Encounter (HOSPITAL_COMMUNITY): Payer: Self-pay | Admitting: Emergency Medicine

## 2023-04-19 ENCOUNTER — Other Ambulatory Visit: Payer: Self-pay

## 2023-04-19 ENCOUNTER — Emergency Department (HOSPITAL_COMMUNITY): Payer: Medicare Other

## 2023-04-19 DIAGNOSIS — I2510 Atherosclerotic heart disease of native coronary artery without angina pectoris: Secondary | ICD-10-CM | POA: Diagnosis not present

## 2023-04-19 DIAGNOSIS — Z7901 Long term (current) use of anticoagulants: Secondary | ICD-10-CM | POA: Diagnosis not present

## 2023-04-19 DIAGNOSIS — R55 Syncope and collapse: Secondary | ICD-10-CM | POA: Insufficient documentation

## 2023-04-19 DIAGNOSIS — I10 Essential (primary) hypertension: Secondary | ICD-10-CM | POA: Insufficient documentation

## 2023-04-19 DIAGNOSIS — J45909 Unspecified asthma, uncomplicated: Secondary | ICD-10-CM | POA: Insufficient documentation

## 2023-04-19 DIAGNOSIS — R609 Edema, unspecified: Secondary | ICD-10-CM | POA: Diagnosis not present

## 2023-04-19 DIAGNOSIS — I959 Hypotension, unspecified: Secondary | ICD-10-CM | POA: Diagnosis not present

## 2023-04-19 DIAGNOSIS — Z79899 Other long term (current) drug therapy: Secondary | ICD-10-CM | POA: Insufficient documentation

## 2023-04-19 DIAGNOSIS — R61 Generalized hyperhidrosis: Secondary | ICD-10-CM | POA: Diagnosis not present

## 2023-04-19 LAB — URINALYSIS, ROUTINE W REFLEX MICROSCOPIC
Bilirubin Urine: NEGATIVE
Glucose, UA: NEGATIVE mg/dL
Hgb urine dipstick: NEGATIVE
Ketones, ur: NEGATIVE mg/dL
Leukocytes,Ua: NEGATIVE
Nitrite: NEGATIVE
Protein, ur: NEGATIVE mg/dL
Specific Gravity, Urine: 1.012 (ref 1.005–1.030)
pH: 7 (ref 5.0–8.0)

## 2023-04-19 LAB — CBC WITH DIFFERENTIAL/PLATELET
Abs Immature Granulocytes: 0.08 10*3/uL — ABNORMAL HIGH (ref 0.00–0.07)
Basophils Absolute: 0.1 10*3/uL (ref 0.0–0.1)
Basophils Relative: 1 %
Eosinophils Absolute: 0.2 10*3/uL (ref 0.0–0.5)
Eosinophils Relative: 1 %
HCT: 39.1 % (ref 39.0–52.0)
Hemoglobin: 12.9 g/dL — ABNORMAL LOW (ref 13.0–17.0)
Immature Granulocytes: 1 %
Lymphocytes Relative: 10 %
Lymphs Abs: 1.5 10*3/uL (ref 0.7–4.0)
MCH: 28.5 pg (ref 26.0–34.0)
MCHC: 33 g/dL (ref 30.0–36.0)
MCV: 86.3 fL (ref 80.0–100.0)
Monocytes Absolute: 1 10*3/uL (ref 0.1–1.0)
Monocytes Relative: 7 %
Neutro Abs: 11.8 10*3/uL — ABNORMAL HIGH (ref 1.7–7.7)
Neutrophils Relative %: 80 %
Platelets: 268 10*3/uL (ref 150–400)
RBC: 4.53 MIL/uL (ref 4.22–5.81)
RDW: 14.6 % (ref 11.5–15.5)
WBC: 14.7 10*3/uL — ABNORMAL HIGH (ref 4.0–10.5)
nRBC: 0 % (ref 0.0–0.2)

## 2023-04-19 LAB — BASIC METABOLIC PANEL
Anion gap: 9 (ref 5–15)
BUN: 15 mg/dL (ref 8–23)
CO2: 27 mmol/L (ref 22–32)
Calcium: 8.7 mg/dL — ABNORMAL LOW (ref 8.9–10.3)
Chloride: 104 mmol/L (ref 98–111)
Creatinine, Ser: 1.21 mg/dL (ref 0.61–1.24)
GFR, Estimated: >60 mL/min (ref >60)
Glucose, Bld: 116 mg/dL — ABNORMAL HIGH (ref 70–99)
Potassium: 4 mmol/L (ref 3.5–5.1)
Sodium: 140 mmol/L (ref 135–145)

## 2023-04-19 LAB — TROPONIN I (HIGH SENSITIVITY): Troponin I (High Sensitivity): 7 ng/L (ref <18)

## 2023-04-19 MED ORDER — SODIUM CHLORIDE 0.9 % IV BOLUS
500.0000 mL | Freq: Once | INTRAVENOUS | Status: AC
  Administered 2023-04-19: 500 mL via INTRAVENOUS

## 2023-04-19 NOTE — ED Provider Notes (Signed)
[place] AT [facility] Provider Note   CSN: [id number] Arrival date & time: 04/19/23  1318     History  Chief Complaint  Patient presents with   Loss of Consciousness    Antonio Carlson is a 77 y.o. male.   Loss of Consciousness Patient resents after syncopal episode.  Was reported sitting down with his son after eating breakfast.  Then passed out.  States had about 3 episodes of passing out and blood pressure was low.  Blood pressure reportedly 80 systolic for EMS.  States his vision narrowed down.  No chest pain or trouble breathing.  Do not feel his heart beating different.  States he has been doing well otherwise.  Of note patient states he did have sex with his wife this morning and took a pill to help facilitate that.  Has not had light headedness with that in the past.  Has been doing well last few days.  Good appetite.    Past Medical History:  Diagnosis Date   Allergy    Anginal pain (HCC)    Arthritis    Asthma    Benign prostatic hypertrophy    Coronary artery disease    Diverticulosis    Dyspnea    GERD (gastroesophageal reflux disease)    History of kidney stones    Hyperlipidemia    Hypertension    Nephrolithiasis    Pleural effusion on right    Pneumonia    PVC's (premature ventricular contractions)    RECTAL BLEEDING 09/15/2010   Qualifier: Diagnosis of  By: Monica Becton PA-c, Amy S    Shingles    twice   Syncope     Home Medications Prior to Admission medications   Medication Sig Start Date End Date Taking? Authorizing Provider  acetaminophen (TYLENOL) 500 MG tablet Take 500-1,000 mg by mouth every 6 (six) hours as needed for moderate pain or headache.    [provider]  albuterol (PROVENTIL) (2.5 MG/3ML) 0.083% nebulizer solution Take 3 mLs (2.5 mg total) by nebulization every 4 (four) hours as needed for wheezing or shortness of breath. 02/26/23   [name], MD  amLODipine (NORVASC) 2.5 MG tablet TAKE 1  TABLET BY MOUTH EVERY DAY 04/13/23   Hilty, Lisette Abu, MD  atorvastatin (LIPITOR) 80 MG tablet TAKE 1 TABLET BY MOUTH DAILY AT 6 PM. 11/13/22   Hilty, Lisette Abu, MD  budesonide (PULMICORT) 0.5 MG/2ML nebulizer solution Take 0.5 mg by nebulization daily.    [provider]  clopidogrel (PLAVIX) 75 MG tablet TAKE 1 TABLET BY MOUTH EVERY DAY 01/29/23   Hilty, Lisette Abu, MD  ezetimibe (ZETIA) 10 MG tablet Take 1 tablet (10 mg total) by mouth daily. 03/16/23   Anabel Halon, MD  fluticasone (FLONASE) 50 MCG/ACT nasal spray Place 2 sprays into both nostrils daily. 11/05/22   Gilmore Laroche, FNP  formoterol (PERFOROMIST) 20 MCG/2ML nebulizer solution Take 2 mLs (20 mcg total) by nebulization 2 (two) times daily. Asthma J45.909 03/22/23   [name], MD  furosemide (LASIX) 20 MG tablet Take 1 tablet (20 mg total) by mouth daily as needed. 01/25/23 04/25/23  Chrystie Nose, MD  metoprolol tartrate (LOPRESSOR) 50 MG tablet TAKE 1/2 TABLET TWICE A DAY BY MOUTH 03/25/23   Anabel Halon, MD  montelukast (SINGULAIR) 10 MG tablet Take 1 tablet (10 mg total) by mouth at bedtime. 03/26/22   Anabel Halon, MD  pantoprazole (PROTONIX) 40 MG tablet TAKE 1  TABLET BY MOUTH EVERY DAY 02/03/23   Hilty, Lisette Abu, MD  predniSONE (DELTASONE) 10 MG tablet Take 10 mg by mouth daily with breakfast. TAKES PRN    [provider]  telmisartan (MICARDIS) 80 MG tablet TAKE 1 TABLET BY MOUTH EVERY DAY 07/07/22   [name], MD      Allergies    Aspirin, Clavulanic acid, Sulfonamide derivatives, and Amoxicillin    Review of Systems   Review of Systems  Cardiovascular:  Positive for syncope.    Physical Exam Updated Vital Signs BP (!) 112/59   Pulse (!) 52   Temp 97.6 F (36.4 C) (Oral)   Resp 15   SpO2 99%  Physical Exam Vitals and nursing note reviewed.  Cardiovascular:     Rate and Rhythm: Regular rhythm.  Pulmonary:     Breath sounds: No wheezing or rhonchi.  Abdominal:     Tenderness:  There is no abdominal tenderness.  Musculoskeletal:        General: No tenderness.     Cervical back: Neck supple.     Comments: Mild edema bilateral lower extremities.  Skin:    Capillary Refill: Capillary refill takes less than 2 seconds.  Neurological:     Mental Status: He is alert and oriented to person, place, and time.     ED Results / Procedures / Treatments   Labs (all labs ordered are listed, but only abnormal results are displayed) Labs Reviewed  CBC WITH DIFFERENTIAL/PLATELET  BASIC METABOLIC PANEL  URINALYSIS, ROUTINE W REFLEX MICROSCOPIC  TROPONIN I (HIGH SENSITIVITY)    EKG EKG Interpretation Date/Time:  Monday April 19 2023 13:19:44 EDT Ventricular Rate:  68 PR Interval:  171 QRS Duration:  107 QT Interval:  451 QTC Calculation: 480 R Axis:   59  Text Interpretation: Sinus rhythm Borderline prolonged QT interval Confirmed by Benjiman Core [phone]) on 04/19/2023 3:01:32 PM  Radiology No results found.  Procedures Procedures    Medications Ordered in ED Medications  sodium chloride 0.9 % bolus 500 mL (500 mLs Intravenous New Bag/Given 04/19/23 1505)    ED Course/ Medical Decision Making/ A&P                                 Medical Decision Making  Patient with syncope.  Happened at home while sitting.  Reportedly pale and diaphoretic.  Has not had episodes like this before.  Differential diagnosis is long but includes dehydration, arrhythmia, medication effect.  Of note patient did take medicine to help with intercourse which definitely could be a cause of hypotension.  Will get basic blood work.  Will give fluid bolus.  Blood pressure improving but still on the lower side.        Final Clinical Impression(s) / ED Diagnoses Final diagnoses:  None    Rx / DC Orders ED Discharge Orders     None         Benjiman Core, MD 04/19/23 2302

## 2023-04-19 NOTE — ED Triage Notes (Signed)
Pt BIB [facility] with reports of syncope x 3 while at home that was witnessed by son. EMS reports pt was pale and diaphoretic. No pain. Pt received NaCl en route.  Initial EMS vitals BP 80 systolic palp HR 66 CBG 170 RR 18 O2 98% RA

## 2023-04-21 DIAGNOSIS — R430 Anosmia: Secondary | ICD-10-CM | POA: Diagnosis not present

## 2023-04-21 DIAGNOSIS — J45909 Unspecified asthma, uncomplicated: Secondary | ICD-10-CM | POA: Diagnosis not present

## 2023-04-28 ENCOUNTER — Ambulatory Visit: Payer: Medicare Other | Attending: Internal Medicine | Admitting: Internal Medicine

## 2023-04-28 ENCOUNTER — Encounter: Payer: Self-pay | Admitting: Internal Medicine

## 2023-04-28 VITALS — BP 116/64 | HR 82 | Ht 68.0 in | Wt 184.0 lb

## 2023-04-28 DIAGNOSIS — Z951 Presence of aortocoronary bypass graft: Secondary | ICD-10-CM | POA: Insufficient documentation

## 2023-04-28 DIAGNOSIS — R55 Syncope and collapse: Secondary | ICD-10-CM | POA: Diagnosis not present

## 2023-04-28 DIAGNOSIS — R600 Localized edema: Secondary | ICD-10-CM | POA: Diagnosis not present

## 2023-04-28 DIAGNOSIS — I480 Paroxysmal atrial fibrillation: Secondary | ICD-10-CM | POA: Diagnosis not present

## 2023-04-28 NOTE — Patient Instructions (Signed)
Medication Instructions:  STOP amlodipine  *If you need a refill on your cardiac medications before your next appointment, please call your pharmacy*   Follow-Up: At [facility], you and your health needs are our priority.  As part of our continuing mission to provide you with exceptional heart care, we have created designated Provider Care Teams.  These Care Teams include your primary Cardiologist (physician) and Advanced Practice Providers (APPs -  Physician Assistants and Nurse Practitioners) who all work together to provide you with the care you need, when you need it.  We recommend signing up for the patient portal called "MyChart".  Sign up information is provided on this After Visit Summary.  MyChart is used to connect with patients for Virtual Visits (Telemedicine).  Patients are able to view lab/test results, encounter notes, upcoming appointments, etc.  Non-urgent messages can be sent to your provider as well.   To learn more about what you can do with MyChart, go to [email or web address].    Your next appointment:   3 months with Dr. Rennis Golden --   Cancel 05/27/23 visit     Dr. Rennis Golden recommends KNEE HIGH COMPRESSION STOCKINGS  -- 20-30 mmHg (compression strength) -- [facility]  -- [address] [place]  -- [phone]  -- [facility] [facility]   -- [address] [place]   -- [phone] -- [facility]  -- [address] [place]  -- [phone]   How to Use Compression Stockings Compression stockings are elastic socks that squeeze the legs. They help to increase blood flow to the legs, decrease swelling in the legs, and reduce the chance of developing blood clots in the lower legs. Compression stockings are often used by people who: Are recovering from surgery. Have poor circulation in their legs. Are prone to getting blood clots in their legs. Have varicose veins. Sit or stay in bed for long  periods of time. How to use compression stockings Before you put on your compression stockings: Make sure that they are the correct size. If you do not know your size, ask your health care provider. Make sure that they are clean, dry, and in good condition. Check them for rips and tears. Do not put them on if they are ripped or torn. Put your stockings on first thing in the morning, before you get out of bed. Keep them on for as long as your health care provider advises. When you are wearing your stockings: Keep them as smooth as possible. Do not allow them to bunch up. It is especially important to prevent the stockings from bunching up around your toes or behind your knees. Do not roll the stockings downward and leave them rolled down. This can decrease blood flow to your leg. Change them right away if they become wet or dirty. When you take off your stockings, inspect your legs and feet. Anything that does not seem normal may require medical attention. Look for: Open sores. Red spots. Swelling. Information and tips Do not stop wearing your compression stockings without talking to your health care provider first. Wash your stockings every day with mild detergent in cold or warm water. Do not use bleach. Air-dry your stockings or dry them in a clothes dryer on low heat. Replace your stockings every 3-6 months. If skin moisturizing is part of your treatment plan, apply lotion or cream at night so that your skin will be dry when you put on the stockings in the morning. It is  harder to put the stockings on when you have lotion on your legs or feet. Contact a health care provider if: Remove your stockings and seek medical care if: You have a feeling of pins and needles in your feet or legs. You have any new changes in your skin. You have skin lesions that are getting worse. You have swelling or pain that is getting worse. Get help right away if: You have numbness or tingling in your lower legs  that does not get better right after you take the stockings off. Your toes or feet become cold and blue. You develop open sores or red spots on your legs that do not go away. You see or feel a warm spot on your leg. You have new swelling or soreness in your leg. You are short of breath or you have chest pain for no reason. You have a rapid or irregular heartbeat. You feel light-headed or dizzy. This information is not intended to replace advice given to you by your health care provider. Make sure you discuss any questions you have with your health care provider. Document Released: 05/10/2009 Document Revised: 12/11/2015 Document Reviewed: 06/20/2014 Elsevier Interactive Patient Education  2017 [employer].

## 2023-04-28 NOTE — Addendum Note (Signed)
Addended by: Lindell Spar on: 04/28/2023 01:52 PM   Modules accepted: Orders

## 2023-04-28 NOTE — Progress Notes (Signed)
OFFICE CONSULT NOTE  Chief Complaint:  Follow-up syncope  Primary Care Physician: Anabel Halon, MD  HPI:  Antonio Carlson is a 77 y.o. male who is being seen today for the evaluation of chest pain at the request of Benjiman Core, MD. This is a pleasant 77 year old male patient of Dr. Sherene Sires he is been followed for asthma for some time.  He has a history of hypertension, dyslipidemia and PVCs in the past.  Family history significant for his father who had an MI in his 30s.  He was a previously a smoker but quit in the 1970s.  Since about January has had some exertional burning in his chest.  Is worse with exercise and improves at rest.  It does not radiate.  His symptoms have been fairly stable.  He says it does not always happen with exertion.  It was felt that some of the symptoms were out of proportion for his lung disease.  His dyslipidemia is well managed with total cholesterol 181, triglycerides 138, HDL 51 and LDL 102 on 40 mg simvastatin.  He is not on aspirin due to anaphylactic reaction.  He also has hypertension and is on a beta-blocker.  Heart rate is in the low 60s.  Blood pressures well controlled today.  EKG was performed the office shows sinus rhythm with sinus arrhythmia at 61.  Mr. [name] is an avid hunter but also notes that when he walks a certain distance he gets pain in both of the buttocks and upper thighs and has to stop.  Once he stops the symptoms get better and then he gets up and goes on again.  He has had x-rays of his hips which showed some mild osteoarthritis but no significant findings to explain his pain.  06/27/2019  Mr. [name] returns today for follow-up of coronary artery bypass grafting which was in early October.  He had four-vessel CABG with LIMA to LAD, RIMA to PDA, right radial to distal obtuse marginal and ramus intermedius on May 01, 2019.  He did very well except for postoperative A. fib with RVR requiring amiodarone and spontaneously converted to  sinus.  He was placed on Eliquis and amiodarone however he could not afford the Eliquis and discontinued it fairly quickly.  He was seen in follow-up and has since been seen by Dr. Vickey Sages who operated on him.  His anticoagulation was discontinued but he remains on amiodarone.  He was cleared to start driving.  According to Mr. [name] he is doing fairly well except that he has been having some cough and left sided lower chest discomfort in the back.  Is worse sometimes with change in position or taking deep breaths.  This was brought up but not felt to be significant.  He plans to go to [place] where he stays for typically 3 months.  He would like to do cardiac rehabilitation at hospital there that is about 15 minutes from his house.  He denies any recurrent palpitations or A. fib.  EKG today shows sinus rhythm.  He did have recent labs show total cholesterol 153, triglycerides 142, HDL 45 and LDL of 83.  Previously he was on simvastatin but changed to atorvastatin 40 mg daily.  02/12/2020  Mr. [name] is seen today in follow-up.  He continues to do well now a ways out from his CABG.  Blood pressure is well controlled.  He has been followed by pulmonary and has had some improvement in his breathing.  His pleural effusions  have resolved.  Cholesterol seems to be reasonably well controlled however LDL was still 102.  This was as of May 2020.  His target LDL is less than 70.  EKG shows sinus rhythm at 65.  03/12/2021  Mr. [name] returns for follow-up.  Overall he is doing well.  Blood pressure was initially elevated however came down to 130/78.  Weight is up a little bit.  He said he struggling with an issue on his hand.  He denies any worsening shortness of breath.  His cholesterol is little higher than target with a total 155, HDL 42, triglycerides 94 and LDL 94 in June 2022.  He is on high-dose atorvastatin which she says he is compliant with.  01/15/2023  Mr. Rash is seen today for follow-up.  He saw Irving Burton  back in February.  He is complaining of some worsening lower extremity swelling.  He had some shortness of breath but has a number of issues including chronic lung disease, back pain and recurrent nasal polyps which is caused him some congestion.  He says he can only walk about 50 to 100 yards before he gets short of breath.  He had a repeat echo a few months ago which showed preserved LV systolic function 60 to 65% and mild AI, essentially unchanged to his last echo which was around 2020.  Blood pressure appears well-controlled.  He had lipids in February which showed total cholesterol 133, HDL 61, triglycerides 67 and LDL 58.  04/28/2023  Mr. [name] is seen today for follow-up of his recent ER visit.  He describes having had a syncopal episode, in fact 3 of them at his house.  He was sitting up in a chair and slumped over then was awakened and had 2 more episodes.  He never laid flat and this is probably why he had persistent syncope.  I suspect it was orthostatic.  He was found to be dehydrated initially and had used an erectile dysfunction medication recently.  Blood pressure has been running a little lower.  Today 116/64.  He still has swelling.  I had put him on Aldactone previously but he did not tolerate this due to hyperkalemia.  He has been on low-dose Lasix.  PMHx:  Past Medical History:  Diagnosis Date   Allergy    Anginal pain (HCC)    Arthritis    Asthma    Benign prostatic hypertrophy    Coronary artery disease    Diverticulosis    Dyspnea    GERD (gastroesophageal reflux disease)    History of kidney stones    Hyperlipidemia    Hypertension    Nephrolithiasis    Pleural effusion on right    Pneumonia    PVC's (premature ventricular contractions)    RECTAL BLEEDING 09/15/2010   Qualifier: Diagnosis of  By: Monica Becton PA-c, Amy S    Shingles    twice   Syncope     Past Surgical History:  Procedure Laterality Date   APPENDECTOMY     CORONARY ARTERY BYPASS GRAFT N/A  05/01/2019   Procedure: CORONARY ARTERY BYPASS GRAFTING (CABG) TIMES FOUR USING LEFT AND RIGHT INTERNAL MAMMARY ARTERIES AND RIGHT RADIAL ARTERY WITH STERNAL PLATING;  Surgeon: Linden Dolin, MD;  Location: [facility] OR;  Service: Open Heart Surgery;  Laterality: N/A;   EYE SURGERY Bilateral    catartact surgery   LEFT HEART CATH AND CORONARY ANGIOGRAPHY N/A 04/18/2019   Procedure: LEFT HEART CATH AND CORONARY ANGIOGRAPHY;  Surgeon: Lyn Records, MD;  Location: [facility] INVASIVE CV LAB;  Service: Cardiovascular;  Laterality: N/A;   NASAL SINUS SURGERY  03/28/2011   RADIAL ARTERY HARVEST Right 05/01/2019   Procedure: Right Radial Artery Harvest;  Surgeon: Linden Dolin, MD;  Location: [facility] OR;  Service: Open Heart Surgery;  Laterality: Right;   TEE WITHOUT CARDIOVERSION N/A 05/01/2019   Procedure: TRANSESOPHAGEAL ECHOCARDIOGRAM (TEE);  Surgeon: Linden Dolin, MD;  Location: [facility] OR;  Service: Open Heart Surgery;  Laterality: N/A;   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   Procedure: TOTAL HIP ARTHROPLASTY ANTERIOR APPROACH;  Surgeon: Ollen Gross, MD;  Location: [facility] ORS;  Service: Orthopedics;  Laterality: Left;    FAMHx:  Family History  Problem Relation Age of Onset   Heart attack Father     SOCHx:   reports that he quit smoking about 53 years ago. His smoking use included cigarettes. He started smoking about 67 years ago. He has a 28 pack-year smoking history. He has never used smokeless tobacco. He reports current alcohol use. He reports that he does not use drugs.  ALLERGIES:  Allergies  Allergen Reactions   Aspirin Other (See Comments)    Pt states "I can't breath"   Clavulanic Acid     Other Reaction(s): Unknown   Sulfonamide Derivatives Rash    Other Reaction(s): Other  Doesn't remember   Amoxicillin     GI upset Did it involve swelling of the face/tongue/throat, SOB, or low BP? No Did it involve sudden or severe rash/hives, skin peeling, or any reaction on the inside of  your mouth or nose? No Did you need to seek medical attention at a hospital or doctor's office? No When did it last happen?    20+ years   If all above answers are "NO", may proceed with cephalosporin use.     ROS: Pertinent items noted in HPI and remainder of comprehensive ROS otherwise negative.  HOME MEDS: Current Outpatient Medications on File Prior to Visit  Medication Sig Dispense Refill   acetaminophen (TYLENOL) 500 MG tablet Take 500-1,000 mg by mouth every 6 (six) hours as needed for moderate pain or headache.     albuterol (PROVENTIL) (2.5 MG/3ML) 0.083% nebulizer solution Take 3 mLs (2.5 mg total) by nebulization every 4 (four) hours as needed for wheezing or shortness of breath. 75 mL 12   amLODipine (NORVASC) 2.5 MG tablet TAKE 1 TABLET BY MOUTH EVERY DAY 90 tablet 3   atorvastatin (LIPITOR) 80 MG tablet TAKE 1 TABLET BY MOUTH DAILY AT 6 PM. 90 tablet 3   budesonide (PULMICORT) 0.5 MG/2ML nebulizer solution Take 0.5 mg by nebulization daily.     clopidogrel (PLAVIX) 75 MG tablet TAKE 1 TABLET BY MOUTH EVERY DAY 90 tablet 3   ezetimibe (ZETIA) 10 MG tablet Take 1 tablet (10 mg total) by mouth daily. 90 tablet 3   fluticasone (FLONASE) 50 MCG/ACT nasal spray Place 2 sprays into both nostrils daily. 16 g 6   formoterol (PERFOROMIST) 20 MCG/2ML nebulizer solution Take 2 mLs (20 mcg total) by nebulization 2 (two) times daily. Asthma J45.909 360 mL 11   metoprolol tartrate (LOPRESSOR) 50 MG tablet TAKE 1/2 TABLET TWICE A DAY BY MOUTH 90 tablet 0   montelukast (SINGULAIR) 10 MG tablet Take 1 tablet (10 mg total) by mouth at bedtime. 90 tablet 4   pantoprazole (PROTONIX) 40 MG tablet TAKE 1 TABLET BY MOUTH EVERY DAY 90 tablet 2   predniSONE (DELTASONE) 10 MG tablet Take 10 mg by mouth daily with  breakfast. TAKES PRN     telmisartan (MICARDIS) 80 MG tablet TAKE 1 TABLET BY MOUTH EVERY DAY 90 tablet 3   furosemide (LASIX) 20 MG tablet Take 1 tablet (20 mg total) by mouth daily as  needed. 30 tablet 5   No current facility-administered medications on file prior to visit.    LABS/IMAGING: No results found for this or any previous visit (from the past 48 hour(s)). No results found.  LIPID PANEL:    Component Value Date/Time   CHOL 133 09/04/2022 0938   TRIG 67 09/04/2022 0938   HDL 61 09/04/2022 0938   CHOLHDL 2.2 09/04/2022 0938   CHOLHDL 4 12/30/2020 1034   VLDL 18.8 12/30/2020 1034   LDLCALC 58 09/04/2022 0938   LDLDIRECT 122.0 08/20/2014 0939    WEIGHTS: Wt Readings from Last 3 Encounters:  04/28/23 184 lb (83.5 kg)  03/05/23 185 lb (83.9 kg)  02/25/23 186 lb (84.4 kg)    VITALS: BP 116/64 (BP Location: Left Arm, Patient Position: Sitting, Cuff Size: Normal)   Pulse 82   Ht 5\' 8"  (1.727 m)   Wt 184 lb (83.5 kg)   SpO2 97%   BMI 27.98 kg/m   EXAM: General appearance: alert and no distress Neck: no carotid bruit, no JVD, and thyroid not enlarged, symmetric, no tenderness/mass/nodules Lungs: diminished breath sounds LLL Heart: regular rate and rhythm, S1, S2 normal, no murmur, click, rub or gallop Abdomen: soft, non-tender; bowel sounds normal; no masses,  no organomegaly Extremities: edema 2+ bilateral LE and varicose veins noted Pulses: normal LE pulses Skin: Skin color, texture, turgor normal. No rashes or lesions Neurologic: Grossly normal Psych: Pleasant  EKG: ER EKG reviewed from 04/19/2023 -sinus rhythm  ASSESSMENT: Syncope Coronary artery disease status post four-vessel CABG (05/01/2019-LIMA to LAD, RIMA to PDA, right radial to distal obtuse marginal, and radial Ms. intermedius of the left circumflex) Dr. Vickey Sages Asthma Hypertension Dyslipidemia Bilateral hip pain Asymptomatic bilateral varicose veins PAF - on Eliquis  PLAN: 1.   Mr. Wan had probable orthostatic syncope.  This may have been due to combination of dehydration and erectile dysfunction medication use.  He continues to have some leg swelling.  I have cut back on  his amlodipine and I think we should stop it at this point.  His blood pressure was low normal today.  He should monitor the blood pressure.  He continue on furosemide 20 mg which is now daily.  Will provide bilateral compression stockings.  No evidence of any recurrent A-fib.  Plan follow-up with me in 3 months or sooner as necessary.  Chrystie Nose, MD, [facility], FACP  [place]  [facility]  Medical Director of the [facility] Lipid [facility] &  Cardiovascular Risk Reduction Clinic Diplomate of the American Board of Clinical Lipidology Attending Cardiologist  Direct Dial: [phone]  Fax: [phone]  Website:  [email or web address] Nicely Sydelle Sherfield 04/28/2023, 1:33 PM

## 2023-05-04 ENCOUNTER — Other Ambulatory Visit: Payer: Self-pay | Admitting: Internal Medicine

## 2023-05-04 DIAGNOSIS — I10 Essential (primary) hypertension: Secondary | ICD-10-CM

## 2023-05-06 ENCOUNTER — Telehealth: Payer: Self-pay | Admitting: Internal Medicine

## 2023-05-06 MED ORDER — BUDESONIDE 0.5 MG/2ML IN SUSP: 0.5000 mg | Freq: Every day | RESPIRATORY_TRACT | 11 refills | Status: DC

## 2023-05-06 NOTE — Telephone Encounter (Signed)
I have sent rx for pulmicort to the pharm with dx code attached  I spoke with the pt and notified that this was done  Nothing further needed

## 2023-05-06 NOTE — Telephone Encounter (Signed)
[facility] has sent in RX requests to no avail. Told PT to call us. PT needs a refill of budesonide (PULMICORT) .He recently was in ER and is running out.   (Please be sure to put the diag code on RX because he always has issues with that.)  Routing to both [facility] and [facility] because [facility] taking a long time to respond.

## 2023-05-20 DIAGNOSIS — Z23 Encounter for immunization: Secondary | ICD-10-CM | POA: Diagnosis not present

## 2023-05-21 DIAGNOSIS — Z23 Encounter for immunization: Secondary | ICD-10-CM | POA: Diagnosis not present

## 2023-05-27 ENCOUNTER — Ambulatory Visit: Payer: Medicare Other | Admitting: Internal Medicine

## 2023-06-05 ENCOUNTER — Emergency Department (HOSPITAL_COMMUNITY)
Admission: EM | Admit: 2023-06-05 | Discharge: 2023-06-05 | Disposition: A | Discharge status: Home / Self Care | Payer: Medicare Other | Attending: Emergency Medicine | Admitting: Emergency Medicine

## 2023-06-05 ENCOUNTER — Other Ambulatory Visit: Payer: Self-pay

## 2023-06-05 DIAGNOSIS — R339 Retention of urine, unspecified: Secondary | ICD-10-CM | POA: Diagnosis not present

## 2023-06-05 DIAGNOSIS — I10 Essential (primary) hypertension: Secondary | ICD-10-CM | POA: Insufficient documentation

## 2023-06-05 DIAGNOSIS — Z79899 Other long term (current) drug therapy: Secondary | ICD-10-CM | POA: Insufficient documentation

## 2023-06-05 DIAGNOSIS — R338 Other retention of urine: Secondary | ICD-10-CM

## 2023-06-05 LAB — URINALYSIS, ROUTINE W REFLEX MICROSCOPIC
Bilirubin Urine: NEGATIVE
Glucose, UA: NEGATIVE mg/dL
Hgb urine dipstick: NEGATIVE
Ketones, ur: NEGATIVE mg/dL
Leukocytes,Ua: NEGATIVE
Nitrite: NEGATIVE
Protein, ur: NEGATIVE mg/dL
Specific Gravity, Urine: 1.008 (ref 1.005–1.030)
pH: 6 (ref 5.0–8.0)

## 2023-06-05 LAB — CBC WITH DIFFERENTIAL/PLATELET
Abs Immature Granulocytes: 0.04 10*3/uL (ref 0.00–0.07)
Basophils Absolute: 0.1 10*3/uL (ref 0.0–0.1)
Basophils Relative: 1 %
Eosinophils Absolute: 0.2 10*3/uL (ref 0.0–0.5)
Eosinophils Relative: 1 %
HCT: 41.8 % (ref 39.0–52.0)
Hemoglobin: 13.9 g/dL (ref 13.0–17.0)
Immature Granulocytes: 0 %
Lymphocytes Relative: 12 %
Lymphs Abs: 1.3 10*3/uL (ref 0.7–4.0)
MCH: 27.6 pg (ref 26.0–34.0)
MCHC: 33.3 g/dL (ref 30.0–36.0)
MCV: 82.9 fL (ref 80.0–100.0)
Monocytes Absolute: 0.6 10*3/uL (ref 0.1–1.0)
Monocytes Relative: 6 %
Neutro Abs: 9.1 10*3/uL — ABNORMAL HIGH (ref 1.7–7.7)
Neutrophils Relative %: 80 %
Platelets: 286 10*3/uL (ref 150–400)
RBC: 5.04 MIL/uL (ref 4.22–5.81)
RDW: 14.5 % (ref 11.5–15.5)
WBC: 11.3 10*3/uL — ABNORMAL HIGH (ref 4.0–10.5)
nRBC: 0 % (ref 0.0–0.2)

## 2023-06-05 LAB — BASIC METABOLIC PANEL
Anion gap: 10 (ref 5–15)
BUN: 17 mg/dL (ref 8–23)
CO2: 25 mmol/L (ref 22–32)
Calcium: 8.9 mg/dL (ref 8.9–10.3)
Chloride: 103 mmol/L (ref 98–111)
Creatinine, Ser: 1.02 mg/dL (ref 0.61–1.24)
GFR, Estimated: >60 mL/min (ref >60)
Glucose, Bld: 122 mg/dL — ABNORMAL HIGH (ref 70–99)
Potassium: 4.2 mmol/L (ref 3.5–5.1)
Sodium: 138 mmol/L (ref 135–145)

## 2023-06-05 NOTE — Discharge Instructions (Addendum)
Foley bag will be converted to a leg bag.  When that gets full just empty it.  Call [facility] on Monday for follow-up.  Urinalysis shows no evidence of urinary tract infection.  Renal functions normal.

## 2023-06-05 NOTE — ED Provider Notes (Addendum)
[place] EMERGENCY DEPARTMENT AT [facility] Provider Note   CSN: [id number] Arrival date & time: 06/05/23  0750     History  Chief Complaint  Patient presents with   Abdominal Pain    Antonio Carlson is a 77 y.o. male.  Patient with difficulty urinating since midnight.  Just been able to urinate a little bit.  He feels as if his bladder is distended.  Patient has a history of enlarged prostate but has never had retention problems.  Followed by Dr. Annabell Howells with [facility].  No nausea or vomiting.  No fevers.  Past medical history significant for benign prostatic hypertrophy diverticulosis history of nephrolithiasis.  Hyperlipidemia hypertension premature ventricular contractions history of asthma does have a history of kidney stones.  Past surgical history significant for cardiac cath in 2020 echocardiogram in 2020 as well.  Patient is a former smoker quit in 1971.       Home Medications Prior to Admission medications   Medication Sig Start Date End Date Taking? Authorizing Provider  acetaminophen (TYLENOL) 500 MG tablet Take 500-1,000 mg by mouth every 6 (six) hours as needed for moderate pain or headache.    [provider]  albuterol (PROVENTIL) (2.5 MG/3ML) 0.083% nebulizer solution Take 3 mLs (2.5 mg total) by nebulization every 4 (four) hours as needed for wheezing or shortness of breath. 02/26/23   [name], MD  atorvastatin (LIPITOR) 80 MG tablet TAKE 1 TABLET BY MOUTH DAILY AT 6 PM. 11/13/22   Hilty, Lisette Abu, MD  budesonide (PULMICORT) 0.5 MG/2ML nebulizer solution Take 2 mLs (0.5 mg total) by nebulization daily. 05/06/23   [name], MD  clopidogrel (PLAVIX) 75 MG tablet TAKE 1 TABLET BY MOUTH EVERY DAY 01/29/23   Hilty, Lisette Abu, MD  ezetimibe (ZETIA) 10 MG tablet Take 1 tablet (10 mg total) by mouth daily. 03/16/23   Anabel Halon, MD  fluticasone (FLONASE) 50 MCG/ACT nasal spray Place 2 sprays into both nostrils daily. 11/05/22    Gilmore Laroche, FNP  formoterol (PERFOROMIST) 20 MCG/2ML nebulizer solution Take 2 mLs (20 mcg total) by nebulization 2 (two) times daily. Asthma J45.909 03/22/23   [name], MD  furosemide (LASIX) 20 MG tablet Take 1 tablet (20 mg total) by mouth daily as needed. 01/25/23 04/25/23  Chrystie Nose, MD  metoprolol tartrate (LOPRESSOR) 50 MG tablet TAKE 1/2 TABLET TWICE A DAY BY MOUTH 03/25/23   Anabel Halon, MD  montelukast (SINGULAIR) 10 MG tablet Take 1 tablet (10 mg total) by mouth at bedtime. 03/26/22   Anabel Halon, MD  pantoprazole (PROTONIX) 40 MG tablet TAKE 1 TABLET BY MOUTH EVERY DAY 02/03/23   Hilty, Lisette Abu, MD  predniSONE (DELTASONE) 10 MG tablet Take 10 mg by mouth daily with breakfast. TAKES PRN    [provider]  telmisartan (MICARDIS) 80 MG tablet TAKE 1 TABLET BY MOUTH EVERY DAY 07/07/22   [name], MD      Allergies    Aspirin, Clavulanic acid, Sulfonamide derivatives, and Amoxicillin    Review of Systems   Review of Systems  Constitutional:  Negative for chills and fever.  HENT:  Negative for ear pain and sore throat.   Eyes:  Negative for pain and visual disturbance.  Respiratory:  Negative for cough and shortness of breath.   Cardiovascular:  Negative for chest pain and palpitations.  Gastrointestinal:  Positive for abdominal pain. Negative for vomiting.  Genitourinary:  Positive for difficulty  urinating. Negative for dysuria and hematuria.  Musculoskeletal:  Negative for arthralgias and back pain.  Skin:  Negative for color change and rash.  Neurological:  Negative for seizures and syncope.  All other systems reviewed and are negative.   Physical Exam Updated Vital Signs BP (!) 179/85   Pulse 80   Temp (!) 97.5 F (36.4 C) (Oral)   SpO2 97%  Physical Exam Vitals and nursing note reviewed.  Constitutional:      General: He is not in acute distress.    Appearance: Normal appearance. He is well-developed.  HENT:     Head:  Normocephalic and atraumatic.  Eyes:     Extraocular Movements: Extraocular movements intact.     Conjunctiva/sclera: Conjunctivae normal.     Pupils: Pupils are equal, round, and reactive to light.  Cardiovascular:     Rate and Rhythm: Normal rate and regular rhythm.     Heart sounds: No murmur heard. Pulmonary:     Effort: Pulmonary effort is normal. No respiratory distress.     Breath sounds: Normal breath sounds.  Abdominal:     Palpations: Abdomen is soft.     Tenderness: There is no abdominal tenderness.     Comments: Bladder palpable up to the level of the umbilicus.  Upper abdomen nontender.  Musculoskeletal:        General: No swelling.     Cervical back: Neck supple.  Skin:    General: Skin is warm and dry.     Capillary Refill: Capillary refill takes less than 2 seconds.  Neurological:     Mental Status: He is alert.  Psychiatric:        Mood and Affect: Mood normal.     ED Results / Procedures / Treatments   Labs (all labs ordered are listed, but only abnormal results are displayed) Labs Reviewed  CBC WITH DIFFERENTIAL/PLATELET  BASIC METABOLIC PANEL  URINALYSIS, ROUTINE W REFLEX MICROSCOPIC    EKG None  Radiology No results found.  Procedures Procedures    Medications Ordered in ED Medications - No data to display  ED Course/ Medical Decision Making/ A&P                                 Medical Decision Making Amount and/or Complexity of Data Reviewed Labs: ordered.  Bladder scan not working properly.  But clinically his bladder is distended.  Will place Foley catheter.  Will get CBC basic metabolic panel and urinalysis.  Patient does have a history of kidney stones.  But does not seem to have any flank pain or pain consistent with that.  Also does have a history of prostate enlargement but has not had retention problems before.  Is followed by [facility].  Foley catheter placed about 800 cc of urine came out so consistent with urinary  retention.  Urinalysis is not consistent with urinary tract infection.  Patient's white blood cell count elevated a little bit 11.3.  But actually was elevated at 14 just a couple weeks ago.  Hemoglobin is good at 13.9 platelets are good at 286 basic metabolic panel no electrolyte abnormalities GFR is greater than 60.  Patient is already followed by [facility].  Will switch over to leg bag.  Patient stable for discharge home.   Final Clinical Impression(s) / ED Diagnoses Final diagnoses:  Acute urinary retention    Rx / DC Orders ED Discharge Orders  None         Vanetta Mulders, MD 06/05/23 6962    Vanetta Mulders, MD 06/05/23 1006

## 2023-06-05 NOTE — ED Triage Notes (Signed)
Pt c/o urnary retention and abd pain since 2am this morning, states "feels like my bladder is about to bust" states he has drank several glasses of water, but unable to urinate normally. Rates pain 10/10. Also endorses some episodes of diarrhea, dneies N/V

## 2023-06-07 ENCOUNTER — Ambulatory Visit: Payer: Self-pay | Admitting: Internal Medicine

## 2023-06-07 ENCOUNTER — Ambulatory Visit (INDEPENDENT_AMBULATORY_CARE_PROVIDER_SITE_OTHER): Payer: Medicare Other | Admitting: Internal Medicine

## 2023-06-07 ENCOUNTER — Encounter: Payer: Self-pay | Admitting: Internal Medicine

## 2023-06-07 VITALS — BP 135/76 | HR 95 | Resp 16 | Ht 68.0 in | Wt 188.0 lb

## 2023-06-07 DIAGNOSIS — R300 Dysuria: Secondary | ICD-10-CM | POA: Diagnosis not present

## 2023-06-07 DIAGNOSIS — Z978 Presence of other specified devices: Secondary | ICD-10-CM | POA: Diagnosis not present

## 2023-06-07 MED ORDER — AZO URINARY PAIN RELIEF 99.5 MG PO TABS
1.0000 | ORAL_TABLET | Freq: Three times a day (TID) | ORAL | 0 refills | Status: DC | PRN
Start: 2023-06-07 — End: 2023-08-27

## 2023-06-07 NOTE — Patient Instructions (Signed)
It was a pleasure to see you today.  Thank you for giving [place] the opportunity to be involved in your care.  Below is a brief recap of your visit and next steps.  We will plan to see you again on 11/25.  Summary Azo prescribed for pain relief Check urine study and will assist with scheduling urology evaluation Follow up on 11/25 with Dr. Allena Katz

## 2023-06-07 NOTE — Progress Notes (Signed)
Acute Office Visit  Subjective:     Patient ID: Antonio Carlson, male    DOB: 1945/12/12, 77 y.o.   MRN: [id number]  Chief Complaint  Patient presents with   dark urine     States the urine has been very dark- has not seen actual blood but states the urine yesterday was almost brown but today it looks lighter. States when he gets the urge to urinate it burns really bad   Mr. Wimsatt presents today for an acute visit with concern for dark discoloration of urine.  He presented to the emergency department on Saturday (11/9) in the setting of acute urinary retention.  A Foley catheter was placed and he was instructed to follow-up with his urologist.  He has a history of BPH but had not previously had issues with urinary retention.  Today Mr. Athey reports that his urine was dark brown for most of yesterday.  He noted sediment at the bottom of his catheter bag.  Urine color has mostly normalized today.  His larger concern currently is discomfort with changes in position and when he feels an urge to void.  He describes a burning sensation that last for 30-40 seconds.  He has not appreciated any blood at the urethral meatus.  He is also frustrated because he has not been able to schedule an appointment with urology.  Denies further symptoms such as fever/chills and flank pain.  Review of Systems  Genitourinary:        Burning discomfort with changes in position and an urge to void  All other systems reviewed and are negative.     Objective:    BP 135/76   Pulse 95   Resp 16   Ht 5\' 8"  (1.727 m)   Wt 188 lb (85.3 kg)   SpO2 91%   BMI 28.59 kg/m   Physical Exam Genitourinary:    Penis: Normal.      Comments: Foley catheter in place.  No evidence of trauma or bleeding at the urethral meatus.  Light yellow-colored urine is present in Foley catheter tubing and bag.  No evidence of gross hematuria.     Assessment & Plan:   Problem List Items Addressed This Visit       Foley catheter in  place    Presenting today for an acute visit with concerns for urine discoloration and a burning sensation with changes in position and an urge to void.  Foley catheter was placed 11/9 in the setting of acute urinary retention.  UA obtained at that time was not concerning for infection.  Patient reports that urine color was dark until earlier today.  Urine appears normal, light yellow.  No gross hematuria is appreciated.  No evidence of trauma around the urethral meatus. -Suspect burning discomfort with changes in position and an urge to void is related to Foley catheter insertion.  Lower concern for infection, but will exclude with UA..  I have prescribed Azo for symptom relief. -We were able to assist Mr. Gloris Manchester in scheduling follow-up with urology.  He currently has an appointment scheduled for 11/13.      Meds ordered this encounter  Medications   Phenazopyridine HCl (AZO URINARY PAIN RELIEF) 99.5 MG TABS    Sig: Take 1 tablet by mouth 3 (three) times daily as needed (pain with urination).    Dispense:  6 tablet    Refill:  0    Return if symptoms worsen or fail to improve.  Lucina Mellow  Durwin Nora, MD

## 2023-06-07 NOTE — Telephone Encounter (Signed)
  Chief Complaint: blood in urine Symptoms: tea colored urine with catheter placed, pain at catheter site Pertinent Negatives: Patient denies fever, abdominal pain Disposition: [] ED /[] Urgent Care (no appt availability in office) / [x] Appointment(In office/virtual)/ []  [facility]/ [] Home Care/ [] Refused Recommended Disposition /[] [place] [facility]/ []  Follow-up with PCP Additional Notes: Patient's wife called in and reports that patient had catheter placed in ER Saturday 11/9 and has been having pain at site and tea colored urine since. Patient's wife reports they have been unable to get in with a urologist since his visit. Patient scheduled for appointment today 11/11.   Copied from CRM [id number]. Topic: Clinical - Red Word Triage >> Jun 07, 2023 10:38 AM Desma Mcgregor wrote: Red Word that prompted transfer to Nurse Triage: Blood in urine. Went to ER sat as Pt was unable to urinate. Unable to get an appt with the Urologist. Needs to be seen asap cb# [phone] Reason for Disposition  [1] Pink, slightly red, or tea-colored urine lasts > 24 hours AND [2] not cleared by increased fluid intake AND [3] no recent prostate or bladder surgery  Answer Assessment - Initial Assessment Questions 1. SYMPTOMS: "What symptoms are you concerned about?"     Blood in urine, pain at site 2. ONSET:  "When did the symptoms start?"     When catheter placed Saturday 11/9 3. FEVER: "Is there a fever?" If Yes, ask: "What is the temperature, how was it measured, and when did it start?"     no 4. ABDOMEN PAIN: "Is there any abdomen pain?" (e.g., Scale 1-10; or mild, moderate, severe)     no 5. URINE COLOR: "What color is the urine?"  "Is there blood present in the urine?" (e.g., clear, yellow, cloudy, tea-colored, blood streaks, bright red)     Tea colored 6. URINE AMOUNT: "When did you last empty the urine from the collection bag?" "How much urine was in the bag at that time?" How much urine is in the  collection bag now?"     I empty is frequently and it seems to keep filling back up 7. INSERTION: "How long have you (they) had the catheter?"     Saturday 11/9 8. OTHER SYMPTOMS: "Are there any other symptoms?" (e.g., abdomen swelling, back pain, bladder spasms, constipation, foul smelling urine, leaking of urine)      Tea-colored urine, pain at catheter site 9. MEDICINES: "Are you taking any medicines to treat urinary problems?" (e.g., antibiotics for a urinary tract infection, medicines to treat bladder spasms)      no  Protocols used: Urinary Catheter (e.g., Foley) Symptoms and Questions-A-AH

## 2023-06-07 NOTE — Assessment & Plan Note (Signed)
Presenting today for an acute visit with concerns for urine discoloration and a burning sensation with changes in position and an urge to void.  Foley catheter was placed 11/9 in the setting of acute urinary retention.  UA obtained at that time was not concerning for infection.  Patient reports that urine color was dark until earlier today.  Urine appears normal, light yellow.  No gross hematuria is appreciated.  No evidence of trauma around the urethral meatus. -Suspect burning discomfort with changes in position and an urge to void is related to Foley catheter insertion.  Lower concern for infection, but will exclude with UA..  I have prescribed Azo for symptom relief. -We were able to assist Antonio Carlson in scheduling follow-up with urology.  He currently has an appointment scheduled for 11/13.

## 2023-06-08 ENCOUNTER — Other Ambulatory Visit: Payer: Self-pay | Admitting: Internal Medicine

## 2023-06-08 ENCOUNTER — Encounter: Payer: Self-pay | Admitting: Urology

## 2023-06-08 DIAGNOSIS — N3001 Acute cystitis with hematuria: Secondary | ICD-10-CM

## 2023-06-08 MED ORDER — CIPROFLOXACIN HCL 500 MG PO TABS
500.0000 mg | ORAL_TABLET | Freq: Two times a day (BID) | ORAL | 0 refills | Status: AC
Start: 2023-06-08 — End: 2023-06-13

## 2023-06-08 NOTE — Progress Notes (Unsigned)
Name: Antonio Carlson DOB: 12-11-1945 MRN: [id number]  History of Present Illness: Mr. Antonio Carlson is a 77 y.o. male who presents today as a new patient at [facility] [place]. All available relevant medical records have been reviewed. - GU history: Has been followed by Dr. Annabell Howells at [facility]. 1. BPH with LUTS (urgency, frequency, nocturia). 2. Kidney stones. 3. Erectile dysfunction.  He reports chief complaint of urinary retention.  Recent history:  > 06/05/2023: Seen in ER for difficulty voiding. Denied prior history of urinary retention. "Foley catheter placed about 800 cc of urine came out so consistent with urinary retention.  Urinalysis is not consistent with urinary tract infection.  Patient's white blood cell count elevated a little bit 11.3.  But actually was elevated at 14 just a couple weeks ago.  Hemoglobin is good at 13.9 platelets are good at 286 basic metabolic panel no electrolyte abnormalities GFR is greater than 60."  Today: He reports the catheter is draining***.  He {Actions; denies-reports:120008} gross hematuria.  He {Actions; denies-reports:120008} flank pain or abdominal pain. He {Actions; denies-reports:120008} fevers, nausea, or vomiting.  He {Actions; denies-reports:120008} constipation. He *** taking laxatives, stool softeners, or fiber supplements.   Fall Screening: Do you usually have a device to assist in your mobility? {yes/no:20286} ***cane / ***walker / ***wheelchair   Medications: Current Outpatient Medications  Medication Sig Dispense Refill   acetaminophen (TYLENOL) 500 MG tablet Take 500-1,000 mg by mouth every 6 (six) hours as needed for moderate pain or headache.     albuterol (PROVENTIL) (2.5 MG/3ML) 0.083% nebulizer solution Take 3 mLs (2.5 mg total) by nebulization every 4 (four) hours as needed for wheezing or shortness of breath. 75 mL 12   atorvastatin (LIPITOR) 80 MG tablet TAKE 1 TABLET BY MOUTH DAILY AT 6 PM. 90 tablet 3    budesonide (PULMICORT) 0.5 MG/2ML nebulizer solution Take 2 mLs (0.5 mg total) by nebulization daily. 60 mL 11   clopidogrel (PLAVIX) 75 MG tablet TAKE 1 TABLET BY MOUTH EVERY DAY 90 tablet 3   ezetimibe (ZETIA) 10 MG tablet Take 1 tablet (10 mg total) by mouth daily. 90 tablet 3   fluticasone (FLONASE) 50 MCG/ACT nasal spray Place 2 sprays into both nostrils daily. 16 g 6   formoterol (PERFOROMIST) 20 MCG/2ML nebulizer solution Take 2 mLs (20 mcg total) by nebulization 2 (two) times daily. Asthma J45.909 360 mL 11   furosemide (LASIX) 20 MG tablet Take 1 tablet (20 mg total) by mouth daily as needed. 30 tablet 5   metoprolol tartrate (LOPRESSOR) 50 MG tablet TAKE 1/2 TABLET TWICE A DAY BY MOUTH 90 tablet 0   montelukast (SINGULAIR) 10 MG tablet Take 1 tablet (10 mg total) by mouth at bedtime. 90 tablet 4   pantoprazole (PROTONIX) 40 MG tablet TAKE 1 TABLET BY MOUTH EVERY DAY 90 tablet 2   Phenazopyridine HCl (AZO URINARY PAIN RELIEF) 99.5 MG TABS Take 1 tablet by mouth 3 (three) times daily as needed (pain with urination). 6 tablet 0   predniSONE (DELTASONE) 10 MG tablet Take 10 mg by mouth daily with breakfast. TAKES PRN     telmisartan (MICARDIS) 80 MG tablet TAKE 1 TABLET BY MOUTH EVERY DAY 90 tablet 3   No current facility-administered medications for this visit.    Allergies: Allergies  Allergen Reactions   Aspirin Other (See Comments)    Pt states "I can't breath"   Clavulanic Acid     Other Reaction(s): Unknown   Sulfonamide Derivatives  Rash    Other Reaction(s): Other  Doesn't remember   Amoxicillin     GI upset Did it involve swelling of the face/tongue/throat, SOB, or low BP? No Did it involve sudden or severe rash/hives, skin peeling, or any reaction on the inside of your mouth or nose? No Did you need to seek medical attention at a hospital or doctor's office? No When did it last happen?    20+ years   If all above answers are "NO", may proceed with cephalosporin  use.     Past Medical History:  Diagnosis Date   Allergy    Anginal pain (HCC)    Arthritis    Asthma    Benign prostatic hypertrophy    Coronary artery disease    Diverticulosis    Dyspnea    GERD (gastroesophageal reflux disease)    History of kidney stones    Hyperlipidemia    Hypertension    Nephrolithiasis    Pleural effusion on right    Pneumonia    PVC's (premature ventricular contractions)    RECTAL BLEEDING 09/15/2010   Qualifier: Diagnosis of  By: Monica Becton PA-c, Amy S    Shingles    twice   Syncope    Past Surgical History:  Procedure Laterality Date   APPENDECTOMY     CORONARY ARTERY BYPASS GRAFT N/A 05/01/2019   Procedure: CORONARY ARTERY BYPASS GRAFTING (CABG) TIMES FOUR USING LEFT AND RIGHT INTERNAL MAMMARY ARTERIES AND RIGHT RADIAL ARTERY WITH STERNAL PLATING;  Surgeon: Linden Dolin, MD;  Location: [facility] OR;  Service: Open Heart Surgery;  Laterality: N/A;   EYE SURGERY Bilateral    catartact surgery   LEFT HEART CATH AND CORONARY ANGIOGRAPHY N/A 04/18/2019   Procedure: LEFT HEART CATH AND CORONARY ANGIOGRAPHY;  Surgeon: Lyn Records, MD;  Location: [facility] INVASIVE CV LAB;  Service: Cardiovascular;  Laterality: N/A;   NASAL SINUS SURGERY  03/28/2011   RADIAL ARTERY HARVEST Right 05/01/2019   Procedure: Right Radial Artery Harvest;  Surgeon: Linden Dolin, MD;  Location: [facility] OR;  Service: Open Heart Surgery;  Laterality: Right;   TEE WITHOUT CARDIOVERSION N/A 05/01/2019   Procedure: TRANSESOPHAGEAL ECHOCARDIOGRAM (TEE);  Surgeon: Linden Dolin, MD;  Location[facility];  Service: Open Heart Surgery;  Laterality: N/A;   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   Procedure: TOTAL HIP ARTHROPLASTY ANTERIOR APPROACH;  Surgeon: Ollen Gross, MD;  Location: [facility] ORS;  Service: Orthopedics;  Laterality: Left;   Family History  Problem Relation Age of Onset   Heart attack Father    Social History   Socioeconomic History   Marital status: Married    Spouse  name: Not on file   Number of children: Not on file   Years of education: Not on file   Highest education level: Not on file  Occupational History   Not on file  Tobacco Use   Smoking status: Former    Current packs/day: 0.00    Average packs/day: 2.0 packs/day for 14.0 years (28.0 ttl pk-yrs)    Types: Cigarettes    Start date: 07/28/1955    Quit date: 07/27/1969    Years since quitting: 53.9   Smokeless tobacco: Never  Vaping Use   Vaping status: Never Used  Substance and Sexual Activity   Alcohol use: Yes    Comment: beer occ   Drug use: Never   Sexual activity: Yes  Other Topics Concern   Not on file  Social History Narrative   Not on file  Social Determinants of Health   Financial Resource Strain: Low Risk  (01/07/2022)   Overall Financial Resource Strain (CARDIA)    Difficulty of Paying Living Expenses: Not hard at all  Food Insecurity: No Food Insecurity (05/27/2022)   Hunger Vital Sign    Worried About Running Out of Food in the Last Year: Never true    Ran Out of Food in the Last Year: Never true  Transportation Needs: No Transportation Needs (05/27/2022)   PRAPARE - Administrator, Civil Service (Medical): No    Lack of Transportation (Non-Medical): No  Physical Activity: Insufficiently Active (01/13/2023)   Exercise Vital Sign    Days of Exercise per Week: 7 days    Minutes of Exercise per Session: 10 min  Stress: No Stress Concern Present (03/12/2023)   Received from [facility] - Occupational Stress Questionnaire    Feeling of Stress : Only a little  Social Connections: Unknown (03/12/2023)   [facility]   Social Network    Social Network: Not on file  Intimate Partner Violence: Unknown (03/12/2023)   Received from [facility]   HITS    Physically Hurt: Not on file    Insult or Talk Down To: Not on file    Threaten Physical Harm: Not on file    Scream or Curse: Not on file     SUBJECTIVE  Review of Systems*** Constitutional: Patient denies any unintentional weight loss or change in strength lntegumentary: Patient denies any rashes or pruritus Cardiovascular: Patient denies chest pain or syncope Respiratory: Patient denies shortness of breath Gastrointestinal: Patient ***denies nausea, vomiting, constipation, or diarrhea Musculoskeletal: Patient denies muscle cramps or weakness Neurologic: Patient denies convulsions or seizures Allergic/Immunologic: Patient denies recent allergic reaction(s) Hematologic/Lymphatic: Patient denies bleeding tendencies Endocrine: Patient denies heat/cold intolerance  GU: As per HPI.  OBJECTIVE There were no vitals filed for this visit. There is no height or weight on file to calculate BMI.  Physical Examination*** Constitutional: No obvious distress; patient is non-toxic appearing  Cardiovascular: No visible lower extremity edema.  Respiratory: The patient does not have audible wheezing/stridor; respirations do not appear labored  Gastrointestinal: Abdomen non-distended Musculoskeletal: Normal ROM of UEs  Skin: No obvious rashes/open sores  Neurologic: CN 2-12 grossly intact Psychiatric: Answered questions appropriately with normal affect  Hematologic/Lymphatic/Immunologic: No obvious bruises or sites of spontaneous bleeding  ***GU: Catheter draining ***clear yellow urine.   ASSESSMENT No diagnosis found. *** We discussed pt's urinary retention and possible etiologies including temporary detrusor areflexia, neurogenic bladder, ***BPH, constipation, anticholinergic medication use. Voiding trial was offered.  *** Voiding trial was done and pt was able to successfully empty bladder of the contents instilled. Foley catheter to remain out. Discussion was had about increasing water intake for the next few day to insure good voiding. Pt advised to return if unable to void, or go to ER if after clinic hours.  *** Voiding  trial was performed and pt was unable to successfully empty bladder of the contents instilled. Will attempt voiding trial again in 2 weeks. If unable to pass voiding trial at that time, we will pursue further evaluation with urodynamic testing.  ***Foley catheter to remain in place. ***CIC teaching was performed by nursing staff and supplies were given/ordered from [employer]. Pt is aware they will need to perform CIC ***x/day. *** Advised pt to CIC as needed and pt is aware to call if needing to CIC regularly so we  can order supplies for them. *** Topical lidocaine jelly ordered for use as lubricant for comfort; may also be applied to urethra prior to CIC.  Will plan for follow up in ***months or sooner if needed. Pt verbalized understanding and agreement. All questions were answered.  PLAN Advised the following: Foley catheter ***discontinued. ***No follow-ups on file.  No orders of the defined types were placed in this encounter.   It has been explained that the patient is to follow regularly with their PCP in addition to all other providers involved in their care and to follow instructions provided by these respective offices. Patient advised to contact urology clinic if any urologic-pertaining questions, concerns, new symptoms or problems arise in the interim period.  There are no Patient Instructions on file for this visit.  Electronically signed by:  Donnita Falls, MSN, FNP-C, CUNP 06/08/2023 8:53 AM

## 2023-06-09 ENCOUNTER — Ambulatory Visit: Payer: Medicare Other

## 2023-06-09 ENCOUNTER — Ambulatory Visit (INDEPENDENT_AMBULATORY_CARE_PROVIDER_SITE_OTHER): Payer: Medicare Other | Admitting: Urology

## 2023-06-09 ENCOUNTER — Encounter: Payer: Self-pay | Admitting: Urology

## 2023-06-09 VITALS — BP 124/76 | HR 78 | Temp 98.2°F | Ht 68.0 in | Wt 180.0 lb

## 2023-06-09 DIAGNOSIS — N401 Enlarged prostate with lower urinary tract symptoms: Secondary | ICD-10-CM | POA: Diagnosis not present

## 2023-06-09 DIAGNOSIS — R338 Other retention of urine: Secondary | ICD-10-CM

## 2023-06-09 DIAGNOSIS — Z978 Presence of other specified devices: Secondary | ICD-10-CM

## 2023-06-09 MED ORDER — CIPROFLOXACIN HCL 500 MG PO TABS
500.0000 mg | ORAL_TABLET | Freq: Once | ORAL | Status: DC
Start: 2023-06-09 — End: 2023-06-09

## 2023-06-09 MED ORDER — SILODOSIN 8 MG PO CAPS
8.0000 mg | ORAL_CAPSULE | Freq: Every day | ORAL | 11 refills | Status: DC
Start: 2023-06-09 — End: 2023-06-21

## 2023-06-09 NOTE — Progress Notes (Signed)
Fill and Pull Catheter Removal  Patient is present today for a catheter removal.  Patient was cleaned and prepped in a sterile fashion 300 ml of sterile water/ saline was instilled into the bladder when the patient felt the urge to urinate. Due to bladder spasm we were unable to complete voiding trial.  10 ml of water was then drained from the balloon.  A 16FR foley cath was removed from the bladder no complications were noted .  Patient tolerated well.  Performed by: Guss Bunde, CMA  Follow up/ Additional notes: NP to see after

## 2023-06-09 NOTE — Progress Notes (Signed)
post void residual=52 

## 2023-06-09 NOTE — Patient Instructions (Signed)
    Step 1 Get all of your supplies ready and place them near you. Step 2 Wash your hands with warm, soapy water or put on gloves. Step 3 Wash around the tip of your penis with warm, soapy water or a castille soap towelette. Step 4 Take catheter out of package and drain the lubricant over toilet. Step 5 Hold the penis at a 45 degree angle from the stomach in one hand and the catheter in the other hand. Step 6 Insert the catheter slowly into your urethra. If there is resistance when the catheter reaches the sphincter muscle,              take a deep breath and gently apply steady pressure.              DO NOT FORCE THE CATHETER Step 7 When the urine begins to flow, insert another inch and lower penis. Allow the urine to flow into the toilet. Step 8 When the flow of urine stops, slowly remove the catheter.

## 2023-06-09 NOTE — Progress Notes (Signed)
Subjective:   Patient ID: Antonio Carlson, male    DOB: 18-Sep-1945   MRN: [id number]   Brief patient profile:  77 yowm quit smoking in 1971 with history of [facility] and hyperlipidemia and hbp  Brief patient profile:  06/23/2016 NP  Follow up : HTN, Hyperlipidemia/[facility]  Pt returns for 3 month follow up and med review .  We reviewed all his meds and organized them into a med calendar w/ pt education . He appears to be taking correctly except for flonase , has not been using on regular basis.  Complains of 1 week of nasal congestion , drainage, stuffiness, sneezing, and ear fullness. No fever or sinus pain. Minimal cough .   rec Prednisone taper as directed.  Follow med calendar closely and bring to each visit.  Restart Flonase       11/19/2022  f/u ov/[place] office/Tahsin Benyo re: triad asthma maint on pulm/performist  > referred to ENT in [place] by PCP - supposed to have prednisone prn as part of action plan but ran out and not refilled  Chief Complaint  Patient presents with   Follow-up    Pt f/u states that his sinuses feel clearer compared to previous OV. He still has some nasal congestion but it is mild  Dyspnea:   Not limited by breathing from desired activities   Cough: minimal / assoc pnd/ clear at  present  Sleeping: level bed / 2 pillows less than before  SABA use: never  02: none  Rec If breathing/ wheezing or nasal congestion worsen > prednisone 10 mg x 2 with breakfast until better then 1 daily x 5 days and stop   02/25/2023  f/u ov/[place] office/Elvenia Godden re: triad asthma  maint on berformist/bud/flonase anticipating nasal polypectomy 2011 last one next one Mar 12 2023 in [place]  Chief Complaint  Patient presents with   Asthma   Dyspnea:  not limited / last pred x 3 weeks  Cough: pnds/nasal congestion > cloudy Sleeping: flat bed with one pillow s resp cc  SABA use: none  02: none  Rec No change in your maintenance nebulizers = performist / budesonide  or as needed prednisone  Be sure to tell your anesthesiologist you've been on prednisone in the past Only use your albuterol nebulizer as a rescue medication   03/12/23 BILATERAL MAXILLARY ANTROSTOMY WITH TISSUE REMOVAL BILATERAL ETHMOIDECTOMY AND SPHENOIDOTOMY WITH TISSUE REMOVAL BILATERAL FRONTAL SINUSOTOMY NAVIGATION   06/10/2023  f/u ov/ office/Cosette Prindle re: triad asthma maint on performist/bud  and prn prednisone (did not remember this was in his action plan though)  Chief Complaint  Patient presents with   Follow-up   Dyspnea:  only with exertion  Cough: clear mucus /cipro now for uti/ bph Sleeping: flat in bed/  1-2 pillow s   resp cc  SABA use: rarely usually      No obvious day to day or daytime variability or assoc excess/ purulent sputum or mucus plugs or hemoptysis or cp or chest tightness, subjective wheeze or overt sinus or hb symptoms.    Also denies any obvious fluctuation of symptoms with weather or environmental changes or other aggravating or alleviating factors except as outlined above   No unusual exposure hx or h/o childhood pna/ asthma or knowledge of premature birth.  Current Allergies, Complete Past Medical History, Past Surgical History, Family History, and Social History were reviewed in [employer] record.  ROS  The following are not active complaints unless bolded Hoarseness,  sore throat, dysphagia= globus since sinus surgery, dental problems, itching, sneezing,  nasal congestion or discharge of excess mucus or purulent secretions, ear ache,   fever, chills, sweats, unintended wt loss or wt gain, classically pleuritic or exertional cp,  orthopnea pnd or arm/hand swelling  or leg swelling, presyncope, palpitations, abdominal pain, anorexia, nausea, vomiting, diarrhea  or change in bowel habits or change in bladder habits, change in stools or change in urine, dysuria, hematuria,  rash, arthralgias, visual complaints, headache, numbness,  weakness or ataxia or problems with walking or coordination,  change in mood or  memory.        Current Meds  Medication Sig   acetaminophen (TYLENOL) 500 MG tablet Take 500-1,000 mg by mouth every 6 (six) hours as needed for moderate pain or headache.   albuterol (PROVENTIL) (2.5 MG/3ML) 0.083% nebulizer solution Take 3 mLs (2.5 mg total) by nebulization every 4 (four) hours as needed for wheezing or shortness of breath.   atorvastatin (LIPITOR) 80 MG tablet TAKE 1 TABLET BY MOUTH DAILY AT 6 PM.   budesonide (PULMICORT) 0.5 MG/2ML nebulizer solution Take 2 mLs (0.5 mg total) by nebulization daily.   ciprofloxacin (CIPRO) 500 MG tablet Take 1 tablet (500 mg total) by mouth 2 (two) times daily for 5 days.   clopidogrel (PLAVIX) 75 MG tablet TAKE 1 TABLET BY MOUTH EVERY DAY   ezetimibe (ZETIA) 10 MG tablet Take 1 tablet (10 mg total) by mouth daily.   fluticasone (FLONASE) 50 MCG/ACT nasal spray Place 2 sprays into both nostrils daily.   formoterol (PERFOROMIST) 20 MCG/2ML nebulizer solution Take 2 mLs (20 mcg total) by nebulization 2 (two) times daily. Asthma J45.909   metoprolol tartrate (LOPRESSOR) 50 MG tablet TAKE 1/2 TABLET TWICE A DAY BY MOUTH   montelukast (SINGULAIR) 10 MG tablet TAKE 1 TABLET BY MOUTH EVERYDAY AT BEDTIME   pantoprazole (PROTONIX) 40 MG tablet TAKE 1 TABLET BY MOUTH EVERY DAY   Phenazopyridine HCl (AZO URINARY PAIN RELIEF) 99.5 MG TABS Take 1 tablet by mouth 3 (three) times daily as needed (pain with urination).   predniSONE (DELTASONE) 10 MG tablet Take 10 mg by mouth daily with breakfast. TAKES PRN   silodosin (RAPAFLO) 8 MG CAPS capsule Take 1 capsule (8 mg total) by mouth daily with breakfast.   telmisartan (MICARDIS) 80 MG tablet TAKE 1 TABLET BY MOUTH EVERY DAY               PMH PVCs.  Syncope  - Holter ordered June 27, 2008 > nl  - EP consult June 27, 2008  OSTEOPENIA (ICD-733.90)  COLONIC POLYPS (ICD-211.3)  .......................................[name] Kitchen [name] - see colonoscopy 11/07/10 (repeat in 10 yr)  DIVERTICULOSIS, MILD (ICD-562.10) ASTHMA (ICD-493.90)  - HFA 90% June 11, 2009  ALLERGIC RHINITIS, CHRONIC (ICD-477.9)  - steroid dep until mid oct 2009  - Allergy profile sent January 23, 2010 >> IgE 18.3  - Polypectomy Sept 2012 ......................................................  [name]  BENIGN PROSTATIC HYPERTROPHY, HX OF (ICD-V13.8) .[name] Kitchen... 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  [facility]  HEALTH MAINTENANCE.......................................................[name] Kitchen [facility] 09/23/2021  R C6/7 radiculopathy 2015 .....................................................[name] KitchenDutch Quint  - Pneumovax 11/2004 and @ age 31 June 11, 2009 and Prevnar 13 08/14/2013  - Td 07/2005   And  08/01/2015  - 2nd Covid May 2021        Past Surgical History:  Appendectomy  Colonoscopy     Family History:  heart disease in his father onset at age 71 he was a smoker  Ca brain half brother  Half siblings  - one lung ca, one breast ca, one brain ca Mother dementia onset late 52s lived to be 65    Social History:  quit smoking 1971  rarely drink alcohol  Retired  Widower 2018  - remarried sept 2020        Objective:   Physical Exam  Wts  06/10/2023  186  02/25/2023     186  11/19/2022   184  10/08/2022   179  09/23/2021   175  12/30/2020     172 07/01/2020   177 01/01/2020      175  10/09/2019    170   03/16/2019   179  wt 198 May 17, 2008 >  > 192 07/17/11 >  02/05/2012  191> 05/10/2012  188> 08/10/2012 190> 11/14/2012 180>182 12/12/2012 > 03/14/2013 182 > 07/06/2013 185 > 08/14/2013  184 > 01/31/2014  176 >176 .03/22/2014 >  05/31/2014   181 > 06/11/14  179 > 08/20/2014   184> 02/18/2015  181 > 08/01/2015  178 > 09/16/2015 182  > 12/16/2015    185 > 03/17/2016  185> 08/07/2016   187  > 10/22/2016    183 > 01/22/2017   180> 07/29/2017   177 > 10/28/2017  169 >  06/16/2018  175 > 12/15/2018  182 >     Vital signs reviewed  06/10/2023  - Note at rest 02 sats  93% on RA   General appearance:    amb wm nad    HEENT : Oropharynx  thrush present     Nasal turbinates nl   NECK :  without  apparent JVD/ palpable Nodes/TM    LUNGS: no acc muscle use,  Nl contour chest with trace end exp wheeze  bilaterally without cough on insp or exp maneuvers   CV:  RRR  no s3 or murmur or increase in P2, and no edema   ABD:  soft and nontender with nl inspiratory excursion in the supine position. No bruits or organomegaly appreciated   MS:  Nl gait/ ext warm without deformities Or obvious joint restrictions  calf tenderness, cyanosis or clubbing    SKIN: warm and dry without lesions    NEURO:  alert, approp, nl sensorium with  no motor or cerebellar deficits apparent.       CXR PA and Lateral:   06/10/2023 :    I personally reviewed images and impression is as follows:     Increased marking med R LL appear chronic, not seen well on lateral, no acute findings

## 2023-06-10 ENCOUNTER — Encounter: Payer: Self-pay | Admitting: Internal Medicine

## 2023-06-10 ENCOUNTER — Other Ambulatory Visit: Payer: Self-pay | Admitting: Internal Medicine

## 2023-06-10 ENCOUNTER — Ambulatory Visit (INDEPENDENT_AMBULATORY_CARE_PROVIDER_SITE_OTHER): Payer: Medicare Other | Admitting: Internal Medicine

## 2023-06-10 ENCOUNTER — Ambulatory Visit (HOSPITAL_COMMUNITY)
Admission: RE | Admit: 2023-06-10 | Discharge: 2023-06-10 | Disposition: A | Discharge status: Home / Self Care | Payer: Medicare Other | Source: Ambulatory Visit | Attending: Internal Medicine | Admitting: Internal Medicine

## 2023-06-10 VITALS — BP 152/77 | HR 73 | Temp 98.5°F | Ht 68.0 in | Wt 186.4 lb

## 2023-06-10 DIAGNOSIS — Z886 Allergy status to analgesic agent status: Secondary | ICD-10-CM | POA: Diagnosis present

## 2023-06-10 DIAGNOSIS — R918 Other nonspecific abnormal finding of lung field: Secondary | ICD-10-CM | POA: Diagnosis not present

## 2023-06-10 DIAGNOSIS — J45909 Unspecified asthma, uncomplicated: Secondary | ICD-10-CM | POA: Diagnosis present

## 2023-06-10 DIAGNOSIS — I700 Atherosclerosis of aorta: Secondary | ICD-10-CM | POA: Diagnosis not present

## 2023-06-10 DIAGNOSIS — B370 Candidal stomatitis: Secondary | ICD-10-CM | POA: Diagnosis not present

## 2023-06-10 DIAGNOSIS — J339 Nasal polyp, unspecified: Secondary | ICD-10-CM | POA: Diagnosis not present

## 2023-06-10 DIAGNOSIS — R0602 Shortness of breath: Secondary | ICD-10-CM | POA: Diagnosis not present

## 2023-06-10 MED ORDER — FAMOTIDINE 20 MG PO TABS: ORAL_TABLET | ORAL | 11 refills | Status: DC

## 2023-06-10 MED ORDER — PREDNISONE 10 MG PO TABS
ORAL_TABLET | ORAL | Status: DC
Start: 2023-06-10 — End: 2023-12-13

## 2023-06-10 MED ORDER — FLUCONAZOLE 100 MG PO TABS
100.0000 mg | ORAL_TABLET | Freq: Every day | ORAL | 0 refills | Status: DC
Start: 2023-06-10 — End: 2023-08-27

## 2023-06-10 NOTE — Patient Instructions (Addendum)
Plan A = Automatic = Always=    performist/bud 0.5 mg  first thing and 12 hours later just the perfomist   Rinse and gargle with water when done.  If mouth or throat bother you at all,  try brushing teeth/gums/tongue with arm and hammer toothpaste/ make a slurry and gargle and spit out.      Plan B = Backup (to supplement plan A, not to replace it) Only use your albuterol inhaler as a rescue medication to be used if you can't catch your breath by resting or doing a relaxed purse lip breathing pattern.  - The less you use it, the better it will work when you need it. - Ok to use the inhaler up to 2 puffs  every 4 hours if you must but call for appointment if use goes up over your usual need - Don't leave home without it !!  (think of it like starter fluid)   Plan C = Crisis (instead of Plan B but only if Plan B stops working) - only use your albuterol nebulizer if you first try Plan B and it fails to help > ok to use the nebulizer up to every 4 hours but if start needing it regularly call for immediate appointment  Also  Ok to try albuterol 15 min before an activity (on alternating days)  that you know would usually make you short of breath and see if it makes any difference and if makes none then don't take albuterol after activity unless you can't catch your breath as this means it's the resting that helps, not the albuterol.      Plan D = Deltasone 10 mg  If ABC not working use  take 2 daily until better then one daily x 5 days   For cough > mucinex dm 1200 mg every 12 hours as needed   Diflucan 100 mg one daily x 7 days and follow up with ent if not better  Please remember to go to the  x-ray department  @  [facility] for your tests - we will call you with the results when they are available     Please schedule a follow up visit in 3 months but call sooner if needed

## 2023-06-11 DIAGNOSIS — B370 Candidal stomatitis: Secondary | ICD-10-CM | POA: Insufficient documentation

## 2023-06-11 DIAGNOSIS — N3000 Acute cystitis without hematuria: Secondary | ICD-10-CM | POA: Diagnosis not present

## 2023-06-11 DIAGNOSIS — R338 Other retention of urine: Secondary | ICD-10-CM | POA: Diagnosis not present

## 2023-06-11 DIAGNOSIS — N401 Enlarged prostate with lower urinary tract symptoms: Secondary | ICD-10-CM | POA: Diagnosis not present

## 2023-06-11 DIAGNOSIS — R3915 Urgency of urination: Secondary | ICD-10-CM | POA: Diagnosis not present

## 2023-06-11 NOTE — Assessment & Plan Note (Signed)
Observed 06/10/2023 assoc with globus sensation  - rx difflucan 100 mg daily x 7 d 06/10/2023 >>>  F/u with end if globus not improved with this and max gerd rx   F/u  3 m, sooner prn          Each maintenance medication was reviewed in detail including emphasizing most importantly the difference between maintenance and prns and under what circumstances the prns are to be triggered using an action plan format where appropriate.  Total time for H and P, chart review, counseling, reviewing hfa device(s) and generating customized AVS unique to this office visit / same day charting = 30 min

## 2023-06-11 NOTE — Assessment & Plan Note (Addendum)
Quit smoking 1971  - PFT's  09/16/2015  FEV1 1.81 (61 % ) ratio 56  p 44 % improvement from saba with DLCO  81/81c % corrects to 107  % for alv volume  Done prior to any am meds  - 06/16/2018  After extensive coaching inhaler device,  effectiveness =    90%  - 10/2018 rx prednisone as plan C x 6 days - Allergy profile 10/09/19   >  Eos 0.9 /  IgE  106 - 01/01/2020 submitted paperwork for dupixent> did not go thru > referred to Dr Lucie Leather 07/01/2020  - 12/30/2020 referred again to Dr Kathyrn Lass service  - improved p starting dupixent 07/03/21  but only received 2 infusions the ? Adverse drug effects = arthralgias/ "pnneumonia"  - 11/19/2022 prn prednisone restarted:  prednisone 10 mg x 2 with breakfast until better then 1 daily x 5 days and stop - 06/10/2023 resumed pred as above as plan D  Due to prostate problems avoiding lama here and instead continue performist bid with budesonide just 0.5mg  q am plus  prednisone if needed in the lowest doses possible given thrush issue (see separate a/p)

## 2023-06-12 LAB — UA/M W/RFLX CULTURE, ROUTINE
Bilirubin, UA: NEGATIVE
Glucose, UA: NEGATIVE
Nitrite, UA: POSITIVE — AB
Specific Gravity, UA: 1.02 (ref 1.005–1.030)
Urobilinogen, Ur: 0.2 mg/dL (ref 0.2–1.0)
pH, UA: 6.5 (ref 5.0–7.5)

## 2023-06-12 LAB — MICROSCOPIC EXAMINATION
Casts: NONE SEEN /LPF
RBC, Urine: >30 /HPF — AB (ref 0–2)
WBC, UA: >30 /[HPF] — AB (ref 0–5)

## 2023-06-12 LAB — URINE CULTURE, REFLEX

## 2023-06-14 ENCOUNTER — Ambulatory Visit (INDEPENDENT_AMBULATORY_CARE_PROVIDER_SITE_OTHER): Payer: Medicare Other | Admitting: Podiatry

## 2023-06-14 ENCOUNTER — Encounter: Payer: Self-pay | Admitting: Podiatry

## 2023-06-14 ENCOUNTER — Telehealth: Payer: Self-pay

## 2023-06-14 DIAGNOSIS — M79674 Pain in right toe(s): Secondary | ICD-10-CM | POA: Diagnosis not present

## 2023-06-14 DIAGNOSIS — M79675 Pain in left toe(s): Secondary | ICD-10-CM | POA: Diagnosis not present

## 2023-06-14 DIAGNOSIS — B351 Tinea unguium: Secondary | ICD-10-CM | POA: Diagnosis not present

## 2023-06-14 NOTE — Telephone Encounter (Signed)
Copied from CRM [id number]. Topic: Clinical - Medication Question >> Jun 14, 2023 10:37 AM Almira Coaster wrote: Reason for CRM: Patient does not remember if he needs a new prescription for ciprofloxacin (CIPRO) 500 MG tablet, He spoke with Dr.Patel last week and stated he would need to continue the medication but hasn't received a message or alert from [facility]. His call back number is [phone].

## 2023-06-14 NOTE — Telephone Encounter (Signed)
Spoke to patient

## 2023-06-14 NOTE — Progress Notes (Signed)
  Subjective:  Patient ID: Antonio Carlson, male    DOB: April 23, 1946,  MRN: [id number]  No chief complaint on file.   77 y.o. male presents concern of thickened elongated and painful nails that are difficult to trim. Requesting to have them trimmed today.    Objective:  Physical Exam: warm, good capillary refill, no trophic changes or ulcerative lesions, normal DP and PT pulses, and normal sensory exam. Left Foot: dystrophic yellowed discolored nail plates with subungual debris Right Foot: dystrophic yellowed discolored nail plates with subungual debris  Assessment:   1. Pain due to onychomycosis of toenails of both feet       Plan:  Patient was evaluated and treated and all questions answered. -Mechanically debrided all nails 1-5 bilateral using sterile nail nipper and filed with dremel without incident  -Answered all patient questions -Patient to return  in 3 months for at risk foot care -Patient advised to call the office if any problems or questions arise in the meantime.     No follow-ups on file.

## 2023-06-14 NOTE — Telephone Encounter (Signed)
Patient asking can he get another round of antibiotics patient call back # [phone].

## 2023-06-14 NOTE — Telephone Encounter (Signed)
Patient has completed course of medication

## 2023-06-15 ENCOUNTER — Telehealth: Payer: Self-pay

## 2023-06-15 NOTE — Telephone Encounter (Signed)
Patient was informed yesterday that he needed to contact pulmonary

## 2023-06-15 NOTE — Telephone Encounter (Signed)
Copied from CRM [id number]. Topic: Clinical - Lab/Test Results >> Jun 15, 2023 10:18 AM Deaijah H wrote: Reason for CRM: Pt called in requesting results from xray and would like to know if Dr. Could send in a prescription for a new inhaler

## 2023-06-17 ENCOUNTER — Ambulatory Visit: Payer: Medicare Other

## 2023-06-20 ENCOUNTER — Other Ambulatory Visit: Payer: Self-pay | Admitting: Internal Medicine

## 2023-06-21 ENCOUNTER — Ambulatory Visit (INDEPENDENT_AMBULATORY_CARE_PROVIDER_SITE_OTHER): Payer: Medicare Other | Admitting: Internal Medicine

## 2023-06-21 ENCOUNTER — Telehealth: Payer: Self-pay | Admitting: Internal Medicine

## 2023-06-21 ENCOUNTER — Encounter: Payer: Self-pay | Admitting: Internal Medicine

## 2023-06-21 VITALS — BP 136/74 | HR 80 | Ht 68.0 in | Wt 184.2 lb

## 2023-06-21 DIAGNOSIS — I10 Essential (primary) hypertension: Secondary | ICD-10-CM | POA: Diagnosis not present

## 2023-06-21 DIAGNOSIS — Z1159 Encounter for screening for other viral diseases: Secondary | ICD-10-CM | POA: Diagnosis not present

## 2023-06-21 DIAGNOSIS — I5032 Chronic diastolic (congestive) heart failure: Secondary | ICD-10-CM | POA: Diagnosis not present

## 2023-06-21 DIAGNOSIS — Z23 Encounter for immunization: Secondary | ICD-10-CM

## 2023-06-21 DIAGNOSIS — J45909 Unspecified asthma, uncomplicated: Secondary | ICD-10-CM

## 2023-06-21 DIAGNOSIS — J339 Nasal polyp, unspecified: Secondary | ICD-10-CM | POA: Diagnosis not present

## 2023-06-21 DIAGNOSIS — E782 Mixed hyperlipidemia: Secondary | ICD-10-CM

## 2023-06-21 DIAGNOSIS — Z886 Allergy status to analgesic agent status: Secondary | ICD-10-CM | POA: Diagnosis not present

## 2023-06-21 DIAGNOSIS — E559 Vitamin D deficiency, unspecified: Secondary | ICD-10-CM

## 2023-06-21 DIAGNOSIS — R7303 Prediabetes: Secondary | ICD-10-CM

## 2023-06-21 NOTE — Progress Notes (Unsigned)
Established Patient Office Visit  Subjective:  Patient ID: Antonio Carlson, male    DOB: 08/04/1945  Age: 77 y.o. MRN: [id number]  CC:  Chief Complaint  Patient presents with   Hypertension    Four month follow up    Cough    Cough, patient seen pulmonary , had chest xray done has not gotten results     HPI Antonio Carlson is a 77 y.o. male with past medical history of HTN, CAD s/p CABG, asthma, allergic rhinitis and OA of hip who presents for f/u of his chronic medical conditions.  HTN: BP is well-controlled. Takes Telmisartan 80 mg QD, Amlodipine 5 mg QD and Metoprolol 25 mg BID regularly. Patient denies headache, dizziness, or palpitations.  He was given spironolactone for HFpEF by his Cardiologist. He has Lasix for as needed use for leg swelling.  CAD s/p CABG: He is on Plavix and statin currently.  He also takes metoprolol for it.  He denies any anginal chest pain or dyspnea currently.  He has history of asthma and allergic rhinitis, for which he uses Perforomist and Pulmicort neb.  He denies any dyspnea or wheezing currently.  He follows up with Dr Sherene Sires.  He has tried Dupixent, but had an allergic reaction to it.  He denies any fever or chills.  He has chronic sinusitis and nasal polyps, and is planning to get nasal surgery by [facility] ENT specialist.   Past Medical History:  Diagnosis Date   Allergy    Anginal pain (HCC)    Arthritis    Asthma    Benign prostatic hypertrophy    Coronary artery disease    Diverticulosis    Dyspnea    GERD (gastroesophageal reflux disease)    History of kidney stones    Hyperlipidemia    Hypertension    Nephrolithiasis    Pleural effusion on right    Pneumonia    PVC's (premature ventricular contractions)    RECTAL BLEEDING 09/15/2010   Qualifier: Diagnosis of  By: Monica Becton PA-c, Amy S    Shingles    twice   Syncope     Past Surgical History:  Procedure Laterality Date   APPENDECTOMY     CORONARY ARTERY BYPASS GRAFT N/A  05/01/2019   Procedure: CORONARY ARTERY BYPASS GRAFTING (CABG) TIMES FOUR USING LEFT AND RIGHT INTERNAL MAMMARY ARTERIES AND RIGHT RADIAL ARTERY WITH STERNAL PLATING;  Surgeon: Linden Dolin, MD;  Location: [facility] OR;  Service: Open Heart Surgery;  Laterality: N/A;   EYE SURGERY Bilateral    catartact surgery   LEFT HEART CATH AND CORONARY ANGIOGRAPHY N/A 04/18/2019   Procedure: LEFT HEART CATH AND CORONARY ANGIOGRAPHY;  Surgeon: Lyn Records, MD;  Location: [facility] INVASIVE CV LAB;  Service: Cardiovascular;  Laterality: N/A;   NASAL SINUS SURGERY  03/28/2011   NASAL SINUS SURGERY  03/12/2023   RADIAL ARTERY HARVEST Right 05/01/2019   Procedure: Right Radial Artery Harvest;  Surgeon: Linden Dolin, MD;  Location: [facility] OR;  Service: Open Heart Surgery;  Laterality: Right;   TEE WITHOUT CARDIOVERSION N/A 05/01/2019   Procedure: TRANSESOPHAGEAL ECHOCARDIOGRAM (TEE);  Surgeon: Linden Dolin, MD;  Location: [facility] OR;  Service: Open Heart Surgery;  Laterality: N/A;   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   Procedure: TOTAL HIP ARTHROPLASTY ANTERIOR APPROACH;  Surgeon: Ollen Gross, MD;  Location: [facility] ORS;  Service: Orthopedics;  Laterality: Left;    Family History  Problem Relation Age of Onset   Heart attack Father  Social History   Socioeconomic History   Marital status: Married    Spouse name: Not on file   Number of children: Not on file   Years of education: Not on file   Highest education level: Not on file  Occupational History   Not on file  Tobacco Use   Smoking status: Former    Current packs/day: 0.00    Average packs/day: 2.0 packs/day for 14.0 years (28.0 ttl pk-yrs)    Types: Cigarettes    Start date: 07/28/1955    Quit date: 07/27/1969    Years since quitting: 53.9   Smokeless tobacco: Never  Vaping Use   Vaping status: Never Used  Substance and Sexual Activity   Alcohol use: Yes    Comment: beer occ   Drug use: Never   Sexual activity: Yes  Other Topics  Concern   Not on file  Social History Narrative   Not on file   Social Determinants of Health   Financial Resource Strain: Low Risk  (01/07/2022)   Overall Financial Resource Strain (CARDIA)    Difficulty of Paying Living Expenses: Not hard at all  Food Insecurity: No Food Insecurity (05/27/2022)   Hunger Vital Sign    Worried About Running Out of Food in the Last Year: Never true    Ran Out of Food in the Last Year: Never true  Transportation Needs: No Transportation Needs (05/27/2022)   PRAPARE - Administrator, Civil Service (Medical): No    Lack of Transportation (Non-Medical): No  Physical Activity: Insufficiently Active (01/13/2023)   Exercise Vital Sign    Days of Exercise per Week: 7 days    Minutes of Exercise per Session: 10 min  Stress: No Stress Concern Present (03/12/2023)   Received from [facility] of Occupational Health - Occupational Stress Questionnaire    Feeling of Stress : Only a little  Social Connections: Unknown (03/12/2023)   Received from [facility]   Social Network    Social Network: Not on file  Intimate Partner Violence: Unknown (03/12/2023)   Received from [facility]   HITS    Physically Hurt: Not on file    Insult or Talk Down To: Not on file    Threaten Physical Harm: Not on file    Scream or Curse: Not on file    Outpatient Medications Prior to Visit  Medication Sig Dispense Refill   alfuzosin (UROXATRAL) 10 MG 24 hr tablet Take 10 mg by mouth daily.     amLODipine (NORVASC) 5 MG tablet      spironolactone (ALDACTONE) 25 MG tablet Take 12.5 mg by mouth daily.     acetaminophen (TYLENOL) 500 MG tablet Take 500-1,000 mg by mouth every 6 (six) hours as needed for moderate pain or headache.     albuterol (PROVENTIL) (2.5 MG/3ML) 0.083% nebulizer solution Take 3 mLs (2.5 mg total) by nebulization every 4 (four) hours as needed for wheezing or shortness of breath. 75 mL 12   atorvastatin (LIPITOR) 80 MG tablet  TAKE 1 TABLET BY MOUTH DAILY AT 6 PM. 90 tablet 3   budesonide (PULMICORT) 0.5 MG/2ML nebulizer solution Take 2 mLs (0.5 mg total) by nebulization daily. 60 mL 11   clopidogrel (PLAVIX) 75 MG tablet TAKE 1 TABLET BY MOUTH EVERY DAY 90 tablet 3   ezetimibe (ZETIA) 10 MG tablet Take 1 tablet (10 mg total) by mouth daily. 90 tablet 3   famotidine (PEPCID) 20 MG tablet One after  supper 30 tablet 11   fluconazole (DIFLUCAN) 100 MG tablet Take 1 tablet (100 mg total) by mouth daily. 7 tablet 0   fluticasone (FLONASE) 50 MCG/ACT nasal spray Place 2 sprays into both nostrils daily. 16 g 6   formoterol (PERFOROMIST) 20 MCG/2ML nebulizer solution Take 2 mLs (20 mcg total) by nebulization 2 (two) times daily. Asthma J45.909 360 mL 11   furosemide (LASIX) 20 MG tablet Take 1 tablet (20 mg total) by mouth daily as needed. 90 tablet 3   metoprolol tartrate (LOPRESSOR) 50 MG tablet TAKE 1/2 TABLET TWICE A DAY BY MOUTH 90 tablet 0   montelukast (SINGULAIR) 10 MG tablet TAKE 1 TABLET BY MOUTH EVERYDAY AT BEDTIME 90 tablet 4   pantoprazole (PROTONIX) 40 MG tablet TAKE 1 TABLET BY MOUTH EVERY DAY 90 tablet 2   Phenazopyridine HCl (AZO URINARY PAIN RELIEF) 99.5 MG TABS Take 1 tablet by mouth 3 (three) times daily as needed (pain with urination). 6 tablet 0   predniSONE (DELTASONE) 10 MG tablet 2 each am until better then 1 daily x 5 days and stop     silodosin (RAPAFLO) 8 MG CAPS capsule Take 1 capsule (8 mg total) by mouth daily with breakfast. 30 capsule 11   telmisartan (MICARDIS) 80 MG tablet TAKE 1 TABLET BY MOUTH EVERY DAY 90 tablet 3   No facility-administered medications prior to visit.    Allergies  Allergen Reactions   Aspirin Other (See Comments)    Pt states "I can't breath"   Clavulanic Acid     Other Reaction(s): Unknown   Sulfonamide Derivatives Rash    Other Reaction(s): Other  Doesn't remember   Amoxicillin     GI upset Did it involve swelling of the face/tongue/throat, SOB, or low BP?  No Did it involve sudden or severe rash/hives, skin peeling, or any reaction on the inside of your mouth or nose? No Did you need to seek medical attention at a hospital or doctor's office? No When did it last happen?    20+ years   If all above answers are "NO", may proceed with cephalosporin use.     ROS Review of Systems  Constitutional:  Negative for chills and fever.  HENT:  Positive for congestion, postnasal drip and sinus pain. Negative for sore throat.   Eyes:  Negative for pain and discharge.  Respiratory:  Negative for cough and shortness of breath.   Cardiovascular:  Positive for leg swelling. Negative for chest pain and palpitations.  Gastrointestinal:  Negative for diarrhea, nausea and vomiting.  Endocrine: Negative for polydipsia and polyuria.  Genitourinary:  Negative for dysuria and hematuria.  Musculoskeletal:  Positive for arthralgias and back pain. Negative for neck pain and neck stiffness.  Skin:  Negative for rash.  Neurological:  Positive for numbness. Negative for dizziness, weakness and headaches.  Psychiatric/Behavioral:  Negative for agitation and behavioral problems.       Objective:    Physical Exam Vitals reviewed.  Constitutional:      General: He is not in acute distress.    Appearance: He is not diaphoretic.  HENT:     Head: Normocephalic and atraumatic.     Nose: Congestion present.     Mouth/Throat:     Mouth: Mucous membranes are moist.  Eyes:     General: No scleral icterus.    Extraocular Movements: Extraocular movements intact.  Cardiovascular:     Rate and Rhythm: Normal rate and regular rhythm.     Pulses: Normal  pulses.     Heart sounds: Normal heart sounds. No murmur heard. Pulmonary:     Breath sounds: Normal breath sounds. No wheezing or rales.  Musculoskeletal:     Cervical back: Neck supple. No tenderness.     Right lower leg: No edema.     Left lower leg: No edema.  Skin:    General: Skin is warm.     Findings: No  rash.  Neurological:     General: No focal deficit present.     Mental Status: He is alert and oriented to person, place, and time.  Psychiatric:        Mood and Affect: Mood normal.        Behavior: Behavior normal.     BP (!) 142/79 (BP Location: Right Arm, Patient Position: Sitting, Cuff Size: Large)   Pulse 80   Ht 5\' 8"  (1.727 m)   Wt 184 lb 3.2 oz (83.6 kg)   SpO2 95%   BMI 28.01 kg/m  Wt Readings from Last 3 Encounters:  06/21/23 184 lb 3.2 oz (83.6 kg)  06/10/23 186 lb 6.4 oz (84.6 kg)  06/09/23 180 lb (81.6 kg)    Lab Results  Component Value Date   TSH 1.17 07/01/2021   Lab Results  Component Value Date   WBC 11.3 (H) 06/05/2023   HGB 13.9 06/05/2023   HCT 41.8 06/05/2023   MCV 82.9 06/05/2023   PLT 286 06/05/2023   Lab Results  Component Value Date   NA 138 06/05/2023   K 4.2 06/05/2023   CO2 25 06/05/2023   GLUCOSE 122 (H) 06/05/2023   BUN 17 06/05/2023   CREATININE 1.02 06/05/2023   BILITOT 0.3 09/04/2022   ALKPHOS 132 (H) 09/04/2022   AST 36 09/04/2022   ALT 35 09/04/2022   PROT 6.7 09/04/2022   ALBUMIN 4.6 09/04/2022   CALCIUM 8.9 06/05/2023   ANIONGAP 10 06/05/2023   EGFR 64 03/05/2023   GFR 78.99 07/01/2021   Lab Results  Component Value Date   CHOL 133 09/04/2022   Lab Results  Component Value Date   HDL 61 09/04/2022   Lab Results  Component Value Date   LDLCALC 58 09/04/2022   Lab Results  Component Value Date   TRIG 67 09/04/2022   Lab Results  Component Value Date   CHOLHDL 2.2 09/04/2022   Lab Results  Component Value Date   HGBA1C 5.8 (H) 04/27/2019      Assessment & Plan:   Problem List Items Addressed This Visit   None     No orders of the defined types were placed in this encounter.   Follow-up: No follow-ups on file.    Anabel Halon, MD

## 2023-06-21 NOTE — Assessment & Plan Note (Addendum)
Echo reviewed - Grade 1 diastolic dysfunction. Recent cardiology visit note reviewed On metoprolol, telmisartan and as needed Lasix On spironolactone Has chronic leg swelling, improved with Lasix

## 2023-06-21 NOTE — Patient Instructions (Addendum)
Please start taking Prednisone for 5 days as prescribed by Dr Sherene Sires.  Please take Mucinex as needed for cough.  Please continue to take medications as prescribed.  Please continue to follow low salt diet and ambulate as tolerated.

## 2023-06-21 NOTE — Assessment & Plan Note (Signed)
On statin and Zetia.

## 2023-06-21 NOTE — Assessment & Plan Note (Signed)
Well controlled with Pulmicort and Perforomist neb ?Did not tolerate Dupixent ?On Singulair ?Followed by Dr Sherene Sires ?

## 2023-06-21 NOTE — Telephone Encounter (Signed)
Patient states [facility] needs dx code on formoterol script. Informed patient of note that was sent in on 03/22/2023, "Note to Pharmacy: Asthma J45.909 **Patient requests 90 days supply** Please run prescription through medicare part B. Patient is completely out Indications of Use: Asthma J45.909".   Please contact pharmacy to determine what the issue is, call patients mobile with update.

## 2023-06-21 NOTE — Assessment & Plan Note (Signed)
BP Readings from Last 1 Encounters:  06/21/23 136/74   Well-controlled with Telmisartan 80 mg QD and Metoprolol 25 mg BID Dced Amlodipine 5 mg QD due to leg swelling, BP wnl currently Counseled for compliance with the medications Advised DASH diet and moderate exercise/walking as tolerated

## 2023-06-22 DIAGNOSIS — R7303 Prediabetes: Secondary | ICD-10-CM | POA: Insufficient documentation

## 2023-06-22 NOTE — Assessment & Plan Note (Signed)
Lab Results  Component Value Date   HGBA1C 5.8 (H) 04/27/2019   Advised to follow low carb diet

## 2023-06-23 NOTE — Telephone Encounter (Signed)
[facility] and she confirmed they DID receive dx code on rx from August. She will process and have rx ready for patient.   LVM to advise patient.

## 2023-07-08 DIAGNOSIS — H52203 Unspecified astigmatism, bilateral: Secondary | ICD-10-CM | POA: Diagnosis not present

## 2023-07-08 DIAGNOSIS — H353132 Nonexudative age-related macular degeneration, bilateral, intermediate dry stage: Secondary | ICD-10-CM | POA: Diagnosis not present

## 2023-07-08 DIAGNOSIS — H26493 Other secondary cataract, bilateral: Secondary | ICD-10-CM | POA: Diagnosis not present

## 2023-07-08 DIAGNOSIS — Z961 Presence of intraocular lens: Secondary | ICD-10-CM | POA: Diagnosis not present

## 2023-07-15 DIAGNOSIS — H43393 Other vitreous opacities, bilateral: Secondary | ICD-10-CM | POA: Diagnosis not present

## 2023-07-15 DIAGNOSIS — H353134 Nonexudative age-related macular degeneration, bilateral, advanced atrophic with subfoveal involvement: Secondary | ICD-10-CM | POA: Diagnosis not present

## 2023-07-15 DIAGNOSIS — H43813 Vitreous degeneration, bilateral: Secondary | ICD-10-CM | POA: Diagnosis not present

## 2023-07-15 DIAGNOSIS — H35033 Hypertensive retinopathy, bilateral: Secondary | ICD-10-CM | POA: Diagnosis not present

## 2023-07-26 ENCOUNTER — Ambulatory Visit: Payer: Self-pay | Admitting: Internal Medicine

## 2023-07-26 NOTE — Telephone Encounter (Signed)
Copied from CRM [id number]. Topic: Clinical - Red Word Triage >> Jul 26, 2023  8:32 AM Fuller Mandril wrote: Red Word that prompted transfer to Nurse Triage: Having trouble breathing, wheezing, coughing   Chief Complaint: Cough Symptoms: wheezing, shortness of breath Frequency: constant; worsening Pertinent Negatives: Patient denies fever Disposition: [] ED /[x] Urgent Care (no appt availability in office) / [] Appointment(In office/virtual)/ []  [facility]/ [] Home Care/ [] Refused Recommended Disposition /[] [facility]/ []  Follow-up with PCP Additional Notes: Patient reports cough worsening over the past week. He endorses shortness of breath and mild wheezing when walking around the house. Pt reports coughing fits when talking. Pt sts that he does albuterol treatments every 4 hours and uses his inhaler every 2 hours. Pt denies fever, sts his home pulse oz reads 96% with HR 76. This RN advised patient to go to an urgent care due to no appt availability, pt declined and requested next avail at [facility]. Pt scheduled for 12/31 at 8:40, but sts that he will go to urgent care if symptoms worsen.   Reason for Disposition  [1] MILD difficulty breathing (e.g., minimal/no SOB at rest, SOB with walking, pulse <100) AND [2] still present when not coughing  Answer Assessment - Initial Assessment Questions 1. ONSET: "When did the cough begin?"      Started 1 week ago  2. SEVERITY: "How bad is the cough today?"      Cough has gotten worse,starts coughing when trying to talk  3. SPUTUM: "Describe the color of your sputum" (none, dry cough; clear, white, yellow, green)     Clear  4. HEMOPTYSIS: "Are you coughing up any blood?" If so ask: "How much?" (flecks, streaks, tablespoons, etc.)     No  5. DIFFICULTY BREATHING: "Are you having difficulty breathing?" If Yes, ask: "How bad is it?" (e.g., mild, moderate, severe)    - MILD: No SOB at rest, mild SOB with walking, speaks normally in  sentences, can lie down, no retractions, pulse < 100.    - MODERATE: SOB at rest, SOB with minimal exertion and prefers to sit, cannot lie down flat, speaks in phrases, mild retractions, audible wheezing, pulse 100-120.    - SEVERE: Very SOB at rest, speaks in single words, struggling to breathe, sitting hunched forward, retractions, pulse > 120      Moderate shortness of breath;  has to use albuterol  6. FEVER: "Do you have a fever?" If Yes, ask: "What is your temperature, how was it measured, and when did it start?"     No fever  7. CARDIAC HISTORY: "Do you have any history of heart disease?" (e.g., heart attack, congestive heart failure)      Has stents placed 4 years ago  8. LUNG HISTORY: "Do you have any history of lung disease?"  (e.g., pulmonary embolus, asthma, emphysema)     History of asthma  9. PE RISK FACTORS: "Do you have a history of blood clots?" (or: recent major surgery, recent prolonged travel, bedridden)     No  10. OTHER SYMPTOMS: "Do you have any other symptoms?" (e.g., runny nose, wheezing, chest pain)       Runny nose, has noticed some wheezing  11. PREGNANCY: "Is there any chance you are pregnant?" "When was your last menstrual period?"       N/a  12. TRAVEL: "Have you traveled out of the country in the last month?" (e.g., travel history, exposures)       No  Protocols used: Cough -  Acute Productive-A-AH

## 2023-07-27 ENCOUNTER — Ambulatory Visit (HOSPITAL_COMMUNITY)
Admission: RE | Admit: 2023-07-27 | Discharge: 2023-07-27 | Disposition: A | Discharge status: Home / Self Care | Payer: Medicare Other | Source: Ambulatory Visit | Attending: Family Medicine | Admitting: Family Medicine

## 2023-07-27 ENCOUNTER — Ambulatory Visit (INDEPENDENT_AMBULATORY_CARE_PROVIDER_SITE_OTHER): Payer: Self-pay | Admitting: Family Medicine

## 2023-07-27 ENCOUNTER — Encounter: Payer: Self-pay | Admitting: Family Medicine

## 2023-07-27 VITALS — BP 167/69 | HR 69 | Ht 68.0 in | Wt 187.0 lb

## 2023-07-27 DIAGNOSIS — I5032 Chronic diastolic (congestive) heart failure: Secondary | ICD-10-CM | POA: Diagnosis not present

## 2023-07-27 DIAGNOSIS — R059 Cough, unspecified: Secondary | ICD-10-CM | POA: Diagnosis not present

## 2023-07-27 DIAGNOSIS — I10 Essential (primary) hypertension: Secondary | ICD-10-CM

## 2023-07-27 DIAGNOSIS — Z886 Allergy status to analgesic agent status: Secondary | ICD-10-CM

## 2023-07-27 DIAGNOSIS — J339 Nasal polyp, unspecified: Secondary | ICD-10-CM

## 2023-07-27 DIAGNOSIS — R052 Subacute cough: Secondary | ICD-10-CM | POA: Insufficient documentation

## 2023-07-27 DIAGNOSIS — I519 Heart disease, unspecified: Secondary | ICD-10-CM | POA: Diagnosis not present

## 2023-07-27 DIAGNOSIS — I700 Atherosclerosis of aorta: Secondary | ICD-10-CM | POA: Diagnosis not present

## 2023-07-27 DIAGNOSIS — J45909 Unspecified asthma, uncomplicated: Secondary | ICD-10-CM | POA: Diagnosis not present

## 2023-07-27 MED ORDER — ALBUTEROL SULFATE HFA 108 (90 BASE) MCG/ACT IN AERS: 2.0000 | INHALATION_SPRAY | Freq: Four times a day (QID) | RESPIRATORY_TRACT | 0 refills | Status: AC | PRN

## 2023-07-27 MED ORDER — BENZONATATE 100 MG PO CAPS: 100.0000 mg | ORAL_CAPSULE | Freq: Two times a day (BID) | ORAL | 0 refills | Status: DC | PRN

## 2023-07-27 MED ORDER — AZITHROMYCIN 500 MG PO TABS: 500.0000 mg | ORAL_TABLET | Freq: Every day | ORAL | 0 refills | Status: DC

## 2023-07-27 NOTE — Assessment & Plan Note (Signed)
Elevated at visit but well controlled generally. No med change  F/u with PCP

## 2023-07-27 NOTE — Assessment & Plan Note (Signed)
1 month of worsening cough and shortness of breath with increased sputum production Azithromycin x 3 days. Pred twice daily for 5 days  Albuterol MDI prescribed CXR  Pt travels to [place] in 10 days

## 2023-07-27 NOTE — Assessment & Plan Note (Signed)
No s/s of decompensation

## 2023-07-27 NOTE — Assessment & Plan Note (Addendum)
 CXR  and treat for asthma flare with Azithromycin also

## 2023-07-27 NOTE — Progress Notes (Signed)
   Antonio Carlson     MRN: [id number]      DOB: 08/17/1945  Chief Complaint  Patient presents with   Cough    Productive cough x 1 month    HPI Antonio Carlson is here cough x 1 month, no fever or chills, sputium is clear, notes increased shortness of breath and wheezing  Followed by pulmonary for asthma Plans to be in [place]  on 01/10/2-025 , celebrates 78th birhday on 08/10/2023 Denies sinus pressure or discolored nasal drainage  ROS See HPI  . Denies chest pains, palpitations and leg swelling, PND or orthopnea Denies abdominal pain, nausea, vomiting,diarrhea or constipation.    PE  BP (!) 167/69 (BP Location: Left Arm, Patient Position: Sitting, Cuff Size: Large)   Pulse 69   Ht 5' 8 (1.727 m)   Wt 187 lb 0.6 oz (84.8 kg)   SpO2 96%   BMI 28.44 kg/m   Patient alert and oriented , short of breath with speech  HEENT: No facial asymmetry, EOMI,     Neck supple .  Chest: decreased air entry , no crackles  CVS: S1, S2 no murmurs, no S3.Regular rate.  ABD: Soft non tender.   Ext: No edema  MS: Adequate ROM spine, shoulders, hips and knees.  Skin: Intact, no ulcerations or rash noted.  Psych: Good eye contact, normal affect. Memory intact not anxious or depressed appearing.  CNS: CN 2-12 intact, power,  normal throughout.no focal deficits noted.   Assessment & Plan Subacute cough CXR  and treat for asthma flare with Azithromycin  also   [facility] 1 month of worsening cough and shortness of breath with increased sputum production Azithromycin  x 3 days. Pred twice daily for 5 days  Albuterol  MDI prescribed CXR  Pt travels to [place]  in 10 days  Chronic heart failure with preserved ejection fraction (HCC) No s/s of decompensation  Essential hypertension Elevated at visit but well controlled generally. No med change  F/u with PCP

## 2023-07-27 NOTE — Patient Instructions (Signed)
 F/U as before with Pulmonary and PCP, call if you need to be seen sooner  CXR today, we will call with result this afternoon  You are treated for asthma flare  Antibiotic , azithromycin  is prescribed for 3 days  INCREASE prednisone  10 mg take TWO daily for the next 1 week, starting today  Tessalon  perles are prescribed to help with couigh  Albuterol  is prescribed  Best for 2025 and hope that you enjoy your 40 th birthday very much!  Thanks for choosing [facility], we consider it a privelige to serve you.

## 2023-07-29 DIAGNOSIS — R338 Other retention of urine: Secondary | ICD-10-CM | POA: Diagnosis not present

## 2023-07-29 DIAGNOSIS — R3915 Urgency of urination: Secondary | ICD-10-CM | POA: Diagnosis not present

## 2023-07-29 DIAGNOSIS — N401 Enlarged prostate with lower urinary tract symptoms: Secondary | ICD-10-CM | POA: Diagnosis not present

## 2023-07-29 DIAGNOSIS — N3000 Acute cystitis without hematuria: Secondary | ICD-10-CM | POA: Diagnosis not present

## 2023-08-11 ENCOUNTER — Other Ambulatory Visit: Payer: Self-pay | Admitting: Internal Medicine

## 2023-08-11 DIAGNOSIS — I10 Essential (primary) hypertension: Secondary | ICD-10-CM

## 2023-08-12 ENCOUNTER — Other Ambulatory Visit: Payer: Medicare Other | Admitting: Urology

## 2023-08-18 DIAGNOSIS — H353124 Nonexudative age-related macular degeneration, left eye, advanced atrophic with subfoveal involvement: Secondary | ICD-10-CM | POA: Diagnosis not present

## 2023-08-18 DIAGNOSIS — J329 Chronic sinusitis, unspecified: Secondary | ICD-10-CM | POA: Diagnosis not present

## 2023-08-18 DIAGNOSIS — J45909 Unspecified asthma, uncomplicated: Secondary | ICD-10-CM | POA: Diagnosis not present

## 2023-08-18 DIAGNOSIS — R430 Anosmia: Secondary | ICD-10-CM | POA: Diagnosis not present

## 2023-08-18 DIAGNOSIS — H353134 Nonexudative age-related macular degeneration, bilateral, advanced atrophic with subfoveal involvement: Secondary | ICD-10-CM | POA: Diagnosis not present

## 2023-08-18 DIAGNOSIS — Z7901 Long term (current) use of anticoagulants: Secondary | ICD-10-CM | POA: Diagnosis not present

## 2023-08-18 DIAGNOSIS — J339 Nasal polyp, unspecified: Secondary | ICD-10-CM | POA: Diagnosis not present

## 2023-08-27 ENCOUNTER — Ambulatory Visit: Payer: Medicare Other | Attending: Internal Medicine | Admitting: Internal Medicine

## 2023-08-27 ENCOUNTER — Encounter: Payer: Self-pay | Admitting: Internal Medicine

## 2023-08-27 VITALS — BP 128/72 | HR 65 | Ht 68.0 in | Wt 184.6 lb

## 2023-08-27 DIAGNOSIS — I2510 Atherosclerotic heart disease of native coronary artery without angina pectoris: Secondary | ICD-10-CM | POA: Insufficient documentation

## 2023-08-27 DIAGNOSIS — R609 Edema, unspecified: Secondary | ICD-10-CM | POA: Diagnosis not present

## 2023-08-27 DIAGNOSIS — I10 Essential (primary) hypertension: Secondary | ICD-10-CM | POA: Diagnosis not present

## 2023-08-27 DIAGNOSIS — R0602 Shortness of breath: Secondary | ICD-10-CM | POA: Diagnosis not present

## 2023-08-27 DIAGNOSIS — I5032 Chronic diastolic (congestive) heart failure: Secondary | ICD-10-CM | POA: Insufficient documentation

## 2023-08-27 NOTE — Progress Notes (Signed)
OFFICE CONSULT NOTE  Chief Complaint:  Follow-up, DOE  Primary Care Physician: Anabel Halon, MD  HPI:  Antonio Carlson is a 78 y.o. male who is being seen today for the evaluation of chest pain at the request of Anabel Halon, MD. This is a pleasant 78 year old male patient of Dr. Sherene Sires he is been followed for asthma for some time.  He has a history of hypertension, dyslipidemia and PVCs in the past.  Family history significant for his father who had an MI in his 78s.  He was a previously a smoker but quit in the 1970s.  Since about January has had some exertional burning in his chest.  Is worse with exercise and improves at rest.  It does not radiate.  His symptoms have been fairly stable.  He says it does not always happen with exertion.  It was felt that some of the symptoms were out of proportion for his lung disease.  His dyslipidemia is well managed with total cholesterol 181, triglycerides 138, HDL 51 and LDL 102 on 40 mg simvastatin.  He is not on aspirin due to anaphylactic reaction.  He also has hypertension and is on a beta-blocker.  Heart rate is in the low 60s.  Blood pressures well controlled today.  EKG was performed the office shows sinus rhythm with sinus arrhythmia at 61.  [name] is an avid hunter but also notes that when he walks a certain distance he gets pain in both of the buttocks and upper thighs and has to stop.  Once he stops the symptoms get better and then he gets up and goes on again.  He has had x-rays of his hips which showed some mild osteoarthritis but no significant findings to explain his pain.  06/27/2019  Mr. Human returns today for follow-up of coronary artery bypass grafting which was in early October.  He had four-vessel CABG with LIMA to LAD, RIMA to PDA, right radial to distal obtuse marginal and ramus intermedius on May 01, 2019.  He did very well except for postoperative A. fib with RVR requiring amiodarone and spontaneously converted to sinus.   He was placed on Eliquis and amiodarone however he could not afford the Eliquis and discontinued it fairly quickly.  He was seen in follow-up and has since been seen by Dr. Vickey Sages who operated on him.  His anticoagulation was discontinued but he remains on amiodarone.  He was cleared to start driving.  According to Mr. Sparks he is doing fairly well except that he has been having some cough and left sided lower chest discomfort in the back.  Is worse sometimes with change in position or taking deep breaths.  This was brought up but not felt to be significant.  He plans to go to [place] where he stays for typically 3 months.  He would like to do cardiac rehabilitation at hospital there that is about 15 minutes from his house.  He denies any recurrent palpitations or A. fib.  EKG today shows sinus rhythm.  He did have recent labs show total cholesterol 153, triglycerides 142, HDL 45 and LDL of 83.  Previously he was on simvastatin but changed to atorvastatin 40 mg daily.  02/12/2020  [name] is seen today in follow-up.  He continues to do well now a ways out from his CABG.  Blood pressure is well controlled.  He has been followed by pulmonary and has had some improvement in his breathing.  His pleural  effusions have resolved.  Cholesterol seems to be reasonably well controlled however LDL was still 102.  This was as of May 2020.  His target LDL is less than 70.  EKG shows sinus rhythm at 65.  03/12/2021  [name] returns for follow-up.  Overall he is doing well.  Blood pressure was initially elevated however came down to 130/78.  Weight is up a little bit.  He said he struggling with an issue on his hand.  He denies any worsening shortness of breath.  His cholesterol is little higher than target with a total 155, HDL 42, triglycerides 94 and LDL 94 in June 2022.  He is on high-dose atorvastatin which she says he is compliant with.  01/15/2023  [name] is seen today for follow-up.  He saw Irving Burton back in  February.  He is complaining of some worsening lower extremity swelling.  He had some shortness of breath but has a number of issues including chronic lung disease, back pain and recurrent nasal polyps which is caused him some congestion.  He says he can only walk about 50 to 100 yards before he gets short of breath.  He had a repeat echo a few months ago which showed preserved LV systolic function 60 to 65% and mild AI, essentially unchanged to his last echo which was around 2020.  Blood pressure appears well-controlled.  He had lipids in February which showed total cholesterol 133, HDL 61, triglycerides 67 and LDL 58.  04/28/2023  [name] is seen today for follow-up of his recent ER visit.  He describes having had a syncopal episode, in fact 3 of them at his house.  He was sitting up in a chair and slumped over then was awakened and had 2 more episodes.  He never laid flat and this is probably why he had persistent syncope.  I suspect it was orthostatic.  He was found to be dehydrated initially and had used an erectile dysfunction medication recently.  Blood pressure has been running a little lower.  Today 116/64.  He still has swelling.  I had put him on Aldactone previously but he did not tolerate this due to hyperkalemia.  He has been on low-dose Lasix.  08/27/2023  [name] returns today for follow-up.  Fortunately has had no further syncopal episodes since I last saw him in October.  He has been using Lasix nearly every day except he was off of it for several weeks when he was in [place] recently.  He noted at the time he was very short of breath and had back pain particularly when walking.  This is worsened.  He has had shortness of breath before.  He had an echo about a year ago which showed no significant change however he was short of breath at the time.  He also has pulmonary disease and has upcoming follow-up with Dr. Sherene Sires.  He denies any worsening cough but has had some wheezing.  PMHx:   Past Medical History:  Diagnosis Date   Allergy    Anginal pain (HCC)    Arthritis    Asthma    Benign prostatic hypertrophy    Coronary artery disease    Diverticulosis    Dyspnea    GERD (gastroesophageal reflux disease)    History of kidney stones    Hyperlipidemia    Hypertension    Nephrolithiasis    Pleural effusion on right    Pneumonia    PVC's (premature ventricular contractions)  RECTAL BLEEDING 09/15/2010   Qualifier: Diagnosis of  By: Monica Becton PA-c, Amy S    Shingles    twice   Syncope     Past Surgical History:  Procedure Laterality Date   APPENDECTOMY     CORONARY ARTERY BYPASS GRAFT N/A 05/01/2019   Procedure: CORONARY ARTERY BYPASS GRAFTING (CABG) TIMES FOUR USING LEFT AND RIGHT INTERNAL MAMMARY ARTERIES AND RIGHT RADIAL ARTERY WITH STERNAL PLATING;  Surgeon: Linden Dolin, MD;  Location: [facility] OR;  Service: Open Heart Surgery;  Laterality: N/A;   EYE SURGERY Bilateral    catartact surgery   LEFT HEART CATH AND CORONARY ANGIOGRAPHY N/A 04/18/2019   Procedure: LEFT HEART CATH AND CORONARY ANGIOGRAPHY;  Surgeon: Lyn Records, MD;  Location: [facility] INVASIVE CV LAB;  Service: Cardiovascular;  Laterality: N/A;   NASAL SINUS SURGERY  03/28/2011   NASAL SINUS SURGERY  03/12/2023   RADIAL ARTERY HARVEST Right 05/01/2019   Procedure: Right Radial Artery Harvest;  Surgeon: Linden Dolin, MD;  Location: [facility] OR;  Service: Open Heart Surgery;  Laterality: Right;   TEE WITHOUT CARDIOVERSION N/A 05/01/2019   Procedure: TRANSESOPHAGEAL ECHOCARDIOGRAM (TEE);  Surgeon: Linden Dolin, MD;  Location[facility];  Service: Open Heart Surgery;  Laterality: N/A;   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   Procedure: TOTAL HIP ARTHROPLASTY ANTERIOR APPROACH;  Surgeon: Ollen Gross, MD;  Location: [facility] ORS;  Service: Orthopedics;  Laterality: Left;    FAMHx:  Family History  Problem Relation Age of Onset   Heart attack Father     SOCHx:   reports that he quit smoking  about 54 years ago. His smoking use included cigarettes. He started smoking about 68 years ago. He has a 28 pack-year smoking history. He has never used smokeless tobacco. He reports current alcohol use. He reports that he does not use drugs.  ALLERGIES:  Allergies  Allergen Reactions   Aspirin Other (See Comments)    Pt states "I can't breath"   Clavulanic Acid     Other Reaction(s): Unknown   Sulfonamide Derivatives Rash    Other Reaction(s): Other  Doesn't remember   Amoxicillin     GI upset Did it involve swelling of the face/tongue/throat, SOB, or low BP? No Did it involve sudden or severe rash/hives, skin peeling, or any reaction on the inside of your mouth or nose? No Did you need to seek medical attention at a hospital or doctor's office? No When did it last happen?    20+ years   If all above answers are "NO", may proceed with cephalosporin use.     ROS: Pertinent items noted in HPI and remainder of comprehensive ROS otherwise negative.  HOME MEDS: Current Outpatient Medications on File Prior to Visit  Medication Sig Dispense Refill   acetaminophen (TYLENOL) 500 MG tablet Take 500-1,000 mg by mouth every 6 (six) hours as needed for moderate pain or headache.     albuterol (PROVENTIL) (2.5 MG/3ML) 0.083% nebulizer solution Take 3 mLs (2.5 mg total) by nebulization every 4 (four) hours as needed for wheezing or shortness of breath. 75 mL 12   albuterol (VENTOLIN HFA) 108 (90 Base) MCG/ACT inhaler Inhale 2 puffs into the lungs every 6 (six) hours as needed for wheezing or shortness of breath. 8 g 0   alfuzosin (UROXATRAL) 10 MG 24 hr tablet Take 10 mg by mouth daily.     atorvastatin (LIPITOR) 80 MG tablet TAKE 1 TABLET BY MOUTH DAILY AT 6 PM. 90 tablet 3  budesonide (PULMICORT) 0.5 MG/2ML nebulizer solution Take 2 mLs (0.5 mg total) by nebulization daily. 60 mL 11   clopidogrel (PLAVIX) 75 MG tablet TAKE 1 TABLET BY MOUTH EVERY DAY 90 tablet 3   ezetimibe (ZETIA) 10 MG  tablet Take 1 tablet (10 mg total) by mouth daily. 90 tablet 3   famotidine (PEPCID) 20 MG tablet One after supper 30 tablet 11   fluticasone (FLONASE) 50 MCG/ACT nasal spray Place 2 sprays into both nostrils daily. 16 g 6   formoterol (PERFOROMIST) 20 MCG/2ML nebulizer solution Take 2 mLs (20 mcg total) by nebulization 2 (two) times daily. Asthma J45.909 360 mL 11   furosemide (LASIX) 20 MG tablet Take 1 tablet (20 mg total) by mouth daily as needed. 90 tablet 3   metoprolol tartrate (LOPRESSOR) 50 MG tablet TAKE 1/2 TABLET TWICE A DAY BY MOUTH 90 tablet 0   montelukast (SINGULAIR) 10 MG tablet TAKE 1 TABLET BY MOUTH EVERYDAY AT BEDTIME 90 tablet 4   pantoprazole (PROTONIX) 40 MG tablet TAKE 1 TABLET BY MOUTH EVERY DAY 90 tablet 2   predniSONE (DELTASONE) 10 MG tablet 2 each am until better then 1 daily x 5 days and stop     telmisartan (MICARDIS) 80 MG tablet TAKE 1 TABLET BY MOUTH EVERY DAY 90 tablet 3   No current facility-administered medications on file prior to visit.    LABS/IMAGING: No results found for this or any previous visit (from the past 48 hours). No results found.  LIPID PANEL:    Component Value Date/Time   CHOL 133 09/04/2022 0938   TRIG 67 09/04/2022 0938   HDL 61 09/04/2022 0938   CHOLHDL 2.2 09/04/2022 0938   CHOLHDL 4 12/30/2020 1034   VLDL 18.8 12/30/2020 1034   LDLCALC 58 09/04/2022 0938   LDLDIRECT 122.0 08/20/2014 0939    WEIGHTS: Wt Readings from Last 3 Encounters:  08/27/23 184 lb 9.6 oz (83.7 kg)  07/27/23 187 lb 0.6 oz (84.8 kg)  06/21/23 184 lb 3.2 oz (83.6 kg)    VITALS: BP 128/72   Pulse 65   Ht 5\' 8"  (1.727 m)   Wt 184 lb 9.6 oz (83.7 kg)   SpO2 97%   BMI 28.07 kg/m   EXAM: General appearance: alert and no distress Neck: no carotid bruit, no JVD, and thyroid not enlarged, symmetric, no tenderness/mass/nodules Lungs: diminished breath sounds LLL and wheezes faint bilateral expiratory Heart: regular rate and rhythm, S1, S2 normal,  no murmur, click, rub or gallop and regular rate and rhythm Abdomen: soft, non-tender; bowel sounds normal; no masses,  no organomegaly Extremities: edema trace sockline and varicose veins noted Pulses: 2+ and symmetric Skin: Skin color, texture, turgor normal. No rashes or lesions Neurologic: Grossly normal Psych: Pleasant  EKG: EKG Interpretation Date/Time:  Friday August 27 2023 11:56:45 EST Ventricular Rate:  62 PR Interval:  160 QRS Duration:  88 QT Interval:  438 QTC Calculation: 444 R Axis:   55  Text Interpretation: Normal sinus rhythm Normal ECG When compared with ECG of 19-Apr-2023 13:19, No significant change since last tracing Confirmed by Zoila Shutter [phone]) on 08/27/2023 12:02:20 PM    ASSESSMENT: DOE Syncope, probably orthostatic Coronary artery disease status post four-vessel CABG (05/01/2019-LIMA to LAD, RIMA to PDA, right radial to distal obtuse marginal, and radial Ms. intermedius of the left circumflex) Dr. Vickey Sages Asthma Hypertension Dyslipidemia Bilateral hip pain Asymptomatic bilateral varicose veins PAF - on Eliquis  PLAN: 1.   Mr. Cinque has had some  worsening dyspnea on exertion.  This was worked up last year and felt to be possibly related to his asthma as well as he has had some lower extremity swelling which was worsened by amlodipine.  He is no longer on that.  Blood pressure is good today.  He had a lot of shortness of breath when he was recently in [place].  He does have a pulmonary follow-up in a few weeks.  His last echo was about a year ago which showed no significant changes.  I like to update that echo.  Will continue with the 20 mg Lasix which she is essentially taking daily.  Plan follow-up afterwards.  Chrystie Nose, MD, [facility], FACP  [place]  [facility]  Medical Director of the [facility]  [facility] Diplomate of the American Board of Clinical Lipidology Attending Cardiologist  Direct  Dial: [phone]  Fax: [phone]  Website:  [email or web address] Nicely Kathleene Bergemann 08/27/2023, 12:02 PM

## 2023-08-27 NOTE — Patient Instructions (Signed)
.  Medication Instructions:  Your physician recommends that you continue on your current medications as directed. Please refer to the Current Medication list given to you today.  *If you need a refill on your cardiac medications before your next appointment, please call your pharmacy*   Lab Work: NONE ordered at this time of appointment   Testing/Procedures: Your physician has requested that you have an echocardiogram. Echocardiography is a painless test that uses sound waves to create images of your heart. It provides your doctor with information about the size and shape of your heart and how well your heart's chambers and valves are working. This procedure takes approximately one hour. There are no restrictions for this procedure. Please do NOT wear cologne, perfume, aftershave, or lotions (deodorant is allowed). Please arrive 15 minutes prior to your appointment time.  Please note: We ask at that you not bring children with you during ultrasound (echo/ vascular) testing. Due to room size and safety concerns, children are not allowed in the ultrasound rooms during exams. Our front office staff cannot provide observation of children in our lobby area while testing is being conducted. An adult accompanying a patient to their appointment will only be allowed in the ultrasound room at the discretion of the ultrasound technician under special circumstances. We apologize for any inconvenience.     Follow-Up: At [facility], you and your health needs are our priority.  As part of our continuing mission to provide you with exceptional heart care, we have created designated Provider Care Teams.  These Care Teams include your primary Cardiologist (physician) and Advanced Practice Providers (APPs -  Physician Assistants and Nurse Practitioners) who all work together to provide you with the care you need, when you need it.  We recommend signing up for the patient portal called "MyChart".  Sign up  information is provided on this After Visit Summary.  MyChart is used to connect with patients for Virtual Visits (Telemedicine).  Patients are able to view lab/test results, encounter notes, upcoming appointments, etc.  Non-urgent messages can be sent to your provider as well.   To learn more about what you can do with MyChart, go to [email or web address].    Your next appointment:   6 month(s)  Provider:   Bernadene Person, NP        Other Instructions

## 2023-09-09 DIAGNOSIS — H26492 Other secondary cataract, left eye: Secondary | ICD-10-CM | POA: Diagnosis not present

## 2023-09-09 DIAGNOSIS — H26491 Other secondary cataract, right eye: Secondary | ICD-10-CM | POA: Diagnosis not present

## 2023-09-13 ENCOUNTER — Encounter: Payer: Self-pay | Admitting: Podiatry

## 2023-09-13 ENCOUNTER — Ambulatory Visit (INDEPENDENT_AMBULATORY_CARE_PROVIDER_SITE_OTHER): Payer: Medicare Other | Admitting: Podiatry

## 2023-09-13 DIAGNOSIS — M79674 Pain in right toe(s): Secondary | ICD-10-CM | POA: Diagnosis not present

## 2023-09-13 DIAGNOSIS — M79675 Pain in left toe(s): Secondary | ICD-10-CM

## 2023-09-13 DIAGNOSIS — B351 Tinea unguium: Secondary | ICD-10-CM

## 2023-09-13 NOTE — Progress Notes (Signed)
  Subjective:  Patient ID: Antonio Carlson, male    DOB: [birth year],  MRN: [id number]  No chief complaint on file.   78 y.o. male presents concern of thickened elongated and painful nails that are difficult to trim. Requesting to have them trimmed today. Patient is on plavix and at risk for foot care.    Objective:  Physical Exam: warm, good capillary refill, no trophic changes or ulcerative lesions, normal DP and PT pulses, and normal sensory exam. Left Foot: dystrophic yellowed discolored nail plates with subungual debris Right Foot: dystrophic yellowed discolored nail plates with subungual debris  Assessment:   1. Pain due to onychomycosis of toenails of both feet        Plan:  Patient was evaluated and treated and all questions answered. -Mechanically debrided all nails 1-5 bilateral using sterile nail nipper and filed with dremel without incident  -Answered all patient questions -Patient to return  in 3 months for at risk foot care -Patient advised to call the office if any problems or questions arise in the meantime.     Return in about 3 months (around 12/11/2023) for rfc.

## 2023-09-15 ENCOUNTER — Other Ambulatory Visit: Payer: Self-pay | Admitting: Internal Medicine

## 2023-09-18 NOTE — Progress Notes (Unsigned)
 Subjective:   Patient ID: Antonio Carlson, male    DOB: April 21, 1946   MRN: [id number]   Brief patient profile:  78 yowm quit smoking in 1971 with history of [facility] and hyperlipidemia and hbp  Brief patient profile:  06/23/2016 NP  Follow up : HTN, Hyperlipidemia/[facility]  Pt returns for 3 month follow up and med review .  We reviewed all his meds and organized them into a med calendar w/ pt education . He appears to be taking correctly except for flonase , has not been using on regular basis.  Complains of 1 week of nasal congestion , drainage, stuffiness, sneezing, and ear fullness. No fever or sinus pain. Minimal cough .   rec Prednisone taper as directed.  Follow med calendar closely and bring to each visit.  Restart Flonase       11/19/2022  f/u ov/[place] office/Micky Overturf re: triad asthma maint on pulm/performist  > referred to ENT in [place] by PCP - supposed to have prednisone prn as part of action plan but ran out and not refilled  Chief Complaint  Patient presents with   Follow-up    Pt f/u states that his sinuses feel clearer compared to previous OV. He still has some nasal congestion but it is mild  Dyspnea:   Not limited by breathing from desired activities   Cough: minimal / assoc pnd/ clear at  present  Sleeping: level bed / 2 pillows less than before  SABA use: never  02: none  Rec If breathing/ wheezing or nasal congestion worsen > prednisone 10 mg x 2 with breakfast until better then 1 daily x 5 days and stop   02/25/2023  f/u ov/[place] [facility]/Sianne Tejada re: triad asthma  maint on berformist/bud/flonase anticipating nasal polypectomy 2011 last one next one Mar 12 2023 in [place]  Chief Complaint  Patient presents with   Asthma   Dyspnea:  not limited / last pred x 3 weeks  Cough: pnds/nasal congestion > cloudy Sleeping: flat bed with one pillow s resp cc  SABA use: none  02: none  Rec No change in your maintenance nebulizers = performist / budesonide  or as needed prednisone  Be sure to tell your anesthesiologist you've been on prednisone in the past Only use your albuterol nebulizer as a rescue medication   03/12/23 BILATERAL MAXILLARY ANTROSTOMY WITH TISSUE REMOVAL BILATERAL ETHMOIDECTOMY AND SPHENOIDOTOMY WITH TISSUE REMOVAL BILATERAL FRONTAL SINUSOTOMY NAVIGATION   06/10/2023  f/u ov/[place] office/Dariella Gillihan re: triad asthma maint on performist/bud  and prn prednisone (did not remember this was in his action plan though)  Chief Complaint  Patient presents with   Follow-up   Dyspnea:  only with exertion  Cough: clear mucus /cipro now for uti/ bph Sleeping: flat in bed/  1-2 pillow s   resp cc  SABA use: rarely usually  Rec Plan A = Automatic = Always=    performist/bud 0.5 mg  first thing and 12 hours later just the perfomist  Rinse and gargle with water when done.  If mouth or throat bother you at all,  try brushing teeth/gums/tongue with arm and hammer toothpaste/ make a slurry and gargle and spit out.  Plan B = Backup (to supplement plan A, not to replace it) Only use your albuterol inhaler as a rescue medication  Plan C = Crisis (instead of Plan B but only if Plan B stops working) - only use your albuterol nebulizer if you first try Plan B  Also  Ok  to try albuterol 15 min before an activity (on alternating days)  that you know would usually make you short of breath  Plan D = Deltasone 10 mg  If ABC not working use  take 2 daily until better then one daily x 5 days  For cough > mucinex dm 1200 mg every 12 hours as needed  Diflucan 100 mg one daily x 7 days and follow up with ent if not better  Please schedule a follow up visit in 3 months but call sooner if needed     09/20/2023  f/u ov/[place] office/Maximos Zayas re: triad asthma  maint on performist bud   Chief Complaint  Patient presents with   Follow-up    asthma  Dyspnea:  worse since last ov has not tried plan D , some better if uses saba prior to ex / no ex cp  Cough:  white but thick  Sleeping: sleeping fine s resp cc  SABA use: sev times a day  02: none     No obvious day to day or daytime variability or assoc excess/ purulent sputum or mucus plugs or hemoptysis or cp or chest tightness, subjective wheeze or overt   hb symptoms.    Also denies any obvious fluctuation of symptoms with weather or environmental changes or other aggravating or alleviating factors except as outlined above   No unusual exposure hx or h/o childhood pna/ asthma or knowledge of premature birth.  Current Allergies, Complete Past Medical History, Past Surgical History, Family History, and Social History were reviewed in [employer] record.  ROS  The following are not active complaints unless bolded Hoarseness, sore throat, dysphagia, dental problems, itching, sneezing,  nasal congestion or discharge of excess mucus or purulent secretions, ear ache,   fever, chills, sweats, unintended wt loss or wt gain, classically pleuritic or exertional cp,  orthopnea pnd or arm/hand swelling  or leg swelling, presyncope, palpitations, abdominal pain, anorexia, nausea, vomiting, diarrhea  or change in bowel habits or change in bladder habits, change in stools or change in urine, dysuria, hematuria,  rash, arthralgias, visual complaints, headache, numbness, weakness or ataxia or problems with walking or coordination,  change in mood or  memory.        Current Meds  Medication Sig   acetaminophen (TYLENOL) 500 MG tablet Take 500-1,000 mg by mouth every 6 (six) hours as needed for moderate pain or headache.   albuterol (PROVENTIL) (2.5 MG/3ML) 0.083% nebulizer solution Take 3 mLs (2.5 mg total) by nebulization every 4 (four) hours as needed for wheezing or shortness of breath.   albuterol (VENTOLIN HFA) 108 (90 Base) MCG/ACT inhaler Inhale 2 puffs into the lungs every 6 (six) hours as needed for wheezing or shortness of breath.   alfuzosin (UROXATRAL) 10 MG 24 hr tablet Take 10  mg by mouth daily.   atorvastatin (LIPITOR) 80 MG tablet TAKE 1 TABLET BY MOUTH DAILY AT 6 PM.   budesonide (PULMICORT) 0.5 MG/2ML nebulizer solution Take 2 mLs (0.5 mg total) by nebulization daily.   clopidogrel (PLAVIX) 75 MG tablet TAKE 1 TABLET BY MOUTH EVERY DAY   ezetimibe (ZETIA) 10 MG tablet Take 1 tablet (10 mg total) by mouth daily.   famotidine (PEPCID) 20 MG tablet One after supper   fluticasone (FLONASE) 50 MCG/ACT nasal spray Place 2 sprays into both nostrils daily.   formoterol (PERFOROMIST) 20 MCG/2ML nebulizer solution Take 2 mLs (20 mcg total) by nebulization 2 (two) times daily. Asthma J45.909  furosemide (LASIX) 20 MG tablet Take 1 tablet (20 mg total) by mouth daily as needed.   metoprolol tartrate (LOPRESSOR) 50 MG tablet TAKE 1/2 TABLET TWICE A DAY BY MOUTH   montelukast (SINGULAIR) 10 MG tablet TAKE 1 TABLET BY MOUTH EVERYDAY AT BEDTIME   pantoprazole (PROTONIX) 40 MG tablet TAKE 1 TABLET BY MOUTH EVERY DAY   predniSONE (DELTASONE) 10 MG tablet 2 each am until better then 1 daily x 5 days and stop   telmisartan (MICARDIS) 80 MG tablet TAKE 1 TABLET BY MOUTH EVERY DAY                 PMH PVCs.  Syncope  - Holter ordered June 27, 2008 > nl  - EP consult June 27, 2008  OSTEOPENIA (ICD-733.90)  COLONIC POLYPS (ICD-211.3) .......................................[name] Kitchen [name] - see colonoscopy 11/07/10 (repeat in 10 yr)  DIVERTICULOSIS, MILD (ICD-562.10) ASTHMA (ICD-493.90)  - HFA 90% June 11, 2009  ALLERGIC RHINITIS, CHRONIC (ICD-477.9)  - steroid dep until mid oct 2009  - Allergy profile sent January 23, 2010 >> IgE 18.3  - Polypectomy Sept 2012 ......................................................  [name]  BENIGN PROSTATIC HYPERTROPHY, HX OF (ICD-V13.8) .[name] Kitchen... 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  [facility]  HEALTH MAINTENANCE.......................................................[name] Kitchen [facility] to [facility] 09/23/2021  R C6/7 radiculopathy 2015 .....................................................[name] KitchenDutch Quint  - Pneumovax 11/2004 and @ age 35 June 11, 2009 and Prevnar 13 08/14/2013  - Td 07/2005   And  08/01/2015  - 2nd Covid May 2021        Past Surgical History:  Appendectomy  Colonoscopy     Family History:  heart disease in his father onset at age 59 he was a smoker  Ca brain half brother  Half siblings  - one lung ca, one breast ca, one brain ca Mother dementia onset late 85s lived to be 48    Social History:  quit smoking 1971  rarely drink alcohol  Retired  Widower 2018  - remarried sept 2020        Objective:   Physical Exam  Wts  09/20/2023   182  06/10/2023  186  02/25/2023     186  11/19/2022   184  10/08/2022   179  09/23/2021   175  12/30/2020     172 07/01/2020   177 01/01/2020      175  10/09/2019    170   03/16/2019   179  wt 198 May 17, 2008 >  > 192 07/17/11 >  02/05/2012  191> 05/10/2012  188> 08/10/2012 190> 11/14/2012 180>182 12/12/2012 > 03/14/2013 182 > 07/06/2013 185 > 08/14/2013  184 > 01/31/2014  176 >176 .03/22/2014 >  05/31/2014   181 > 06/11/14  179 > 08/20/2014   184> 02/18/2015  181 > 08/01/2015  178 > 09/16/2015 182  > 12/16/2015   185 > 03/17/2016  185> 08/07/2016   187  > 10/22/2016    183 > 01/22/2017   180> 07/29/2017   177 > 10/28/2017  169 >  06/16/2018  175 > 12/15/2018  182 >   Vital signs reviewed  09/20/2023  - Note at rest 02 sats  93% on RA   General appearance:   amb wm nad    HEENT : Oropharynx  clear     Nasal turbinates s polyps, ears impacted with wax bilaterally    NECK :  without  apparent JVD/ palpable Nodes/TM  LUNGS: no acc muscle use,  Nl contour chest  with trace end exp wheeze bilaterally   CV:  RRR  no s3 or murmur or increase in P2, and no edema   ABD:   soft and nontender   MS:  Gait nl   ext warm without deformities Or obvious joint restrictions  calf tenderness, cyanosis or clubbing    SKIN: warm and dry without lesions    NEURO:  alert, approp, nl sensorium with  no motor or cerebellar deficits apparent.     I personally reviewed images and agree with radiology impression as follows:  CXR:   07/27/23 Coarse perihilar bronchovascular markings as before. No new infiltrate or overt edema. Mild hyperinflation.

## 2023-09-20 ENCOUNTER — Ambulatory Visit (INDEPENDENT_AMBULATORY_CARE_PROVIDER_SITE_OTHER): Payer: Medicare Other | Admitting: Internal Medicine

## 2023-09-20 ENCOUNTER — Encounter: Payer: Self-pay | Admitting: Internal Medicine

## 2023-09-20 VITALS — BP 161/71 | HR 56 | Ht 68.0 in | Wt 182.2 lb

## 2023-09-20 DIAGNOSIS — Z886 Allergy status to analgesic agent status: Secondary | ICD-10-CM | POA: Diagnosis not present

## 2023-09-20 DIAGNOSIS — J339 Nasal polyp, unspecified: Secondary | ICD-10-CM | POA: Diagnosis not present

## 2023-09-20 DIAGNOSIS — J45909 Unspecified asthma, uncomplicated: Secondary | ICD-10-CM

## 2023-09-20 NOTE — Assessment & Plan Note (Addendum)
 Quit smoking 1971  - PFT's  09/16/2015  FEV1 1.81 (61 % ) ratio 56  p 44 % improvement from saba with DLCO  81/81c % corrects to 107  % for alv volume  Done prior to any am meds  - 06/16/2018  After extensive coaching inhaler device,  effectiveness =    90%  - 10/2018 rx prednisone as plan C x 6 days - Allergy profile 10/09/19   >  Eos 0.9 /  IgE  106 - 01/01/2020 submitted paperwork for dupixent> did not go thru > referred to Dr Lucie Leather 07/01/2020  - 12/30/2020 referred again to Dr Kathyrn Lass service  - improved p starting dupixent 07/03/21  but only received 2 infusions the ? Adverse drug effects = arthralgias/ "pnneumonia"  - 11/19/2022 prn prednisone restarted:  prednisone 10 mg x 2 with breakfast until better then 1 daily x 5 days and stop - 06/10/2023 resumed pred as above as plan D - 09/20/2023 referred back to [facility] office for allergy and again resumed pred as plan D  Full action plan again reviewed ABC and D as per AVS   F/u  3 m, sooner prn         Each maintenance medication was reviewed in detail including emphasizing most importantly the difference between maintenance and prns and under what circumstances the prns are to be triggered using an action plan format where appropriate.  Total time for H and P, chart review, counseling, reviewing hfa/ neb  device(s) and generating customized AVS unique to this office visit / same day charting = 25 min

## 2023-09-20 NOTE — Patient Instructions (Addendum)
 Plan A = Automatic = Always=    performist/bud 0.5 mg  first thing and 12 hours later just the performist   Rinse and gargle with water when done.  If mouth or throat bother you at all,  try brushing teeth/gums/tongue with arm and hammer toothpaste/ make a slurry and gargle and spit out.      Plan B = Backup (to supplement plan A, not to replace it) Only use your albuterol inhaler as a rescue medication to be used if you can't catch your breath by resting or doing a relaxed purse lip breathing pattern.  - The less you use it, the better it will work when you need it. - Ok to use the inhaler up to 2 puffs  every 4 hours if you must but call for appointment if use goes up over your usual need - Don't leave home without it !!  (think of it like starter fluid)   Plan C = Crisis (instead of Plan B but only if Plan B stops working) - only use your albuterol nebulizer if you first try Plan B and it fails to help > ok to use the nebulizer up to every 4 hours but if start needing it regularly call for immediate appointment  Also  Ok to try albuterol 15 min before an activity (on alternating days)  that you know would usually make you short of breath and see if it makes any difference and if makes none then don't take albuterol after activity unless you can't catch your breath as this means it's the resting that helps, not the albuterol.      Plan D = Deltasone 10 mg  If ABC not working use  take 2 daily until better then one daily x 5 days and stop   For cough > mucinex dm 1200 mg every 12 hours as needed    My office will be contacting you by phone for referral to [facility] allergy   - if you don't hear back from my office within one week please call us back or notify us thru MyChart and we'll address it right away.  Please schedule a follow up visit in 3 months but call sooner if needed   See ENT re wax removal and evaluation of hearing loss in Left ear  Please schedule a follow up visit in 3 months  but call sooner if needed

## 2023-09-28 ENCOUNTER — Other Ambulatory Visit: Payer: Self-pay | Admitting: Internal Medicine

## 2023-09-28 DIAGNOSIS — I10 Essential (primary) hypertension: Secondary | ICD-10-CM

## 2023-09-29 ENCOUNTER — Ambulatory Visit (HOSPITAL_COMMUNITY)
Admission: RE | Admit: 2023-09-29 | Discharge: 2023-09-29 | Disposition: A | Discharge status: Home / Self Care | Payer: Medicare Other | Source: Ambulatory Visit | Attending: Cardiovascular Disease | Admitting: Cardiovascular Disease

## 2023-09-29 DIAGNOSIS — R0602 Shortness of breath: Secondary | ICD-10-CM | POA: Insufficient documentation

## 2023-09-29 DIAGNOSIS — R609 Edema, unspecified: Secondary | ICD-10-CM | POA: Insufficient documentation

## 2023-09-29 LAB — ECHOCARDIOGRAM COMPLETE
AR max vel: 1.94 cm2
AV Area VTI: 2.05 cm2
AV Area mean vel: 1.95 cm2
AV Mean grad: 3 mmHg
AV Peak grad: 6.3 mmHg
Ao pk vel: 1.25 m/s
Area-P 1/2: 2.93 cm2
MV M vel: 0.79 m/s
MV Peak grad: 2.5 mmHg
P 1/2 time: 546 ms
S' Lateral: 2.84 cm

## 2023-09-30 DIAGNOSIS — H43393 Other vitreous opacities, bilateral: Secondary | ICD-10-CM | POA: Diagnosis not present

## 2023-09-30 DIAGNOSIS — H43813 Vitreous degeneration, bilateral: Secondary | ICD-10-CM | POA: Diagnosis not present

## 2023-09-30 DIAGNOSIS — H353124 Nonexudative age-related macular degeneration, left eye, advanced atrophic with subfoveal involvement: Secondary | ICD-10-CM | POA: Diagnosis not present

## 2023-09-30 DIAGNOSIS — H353134 Nonexudative age-related macular degeneration, bilateral, advanced atrophic with subfoveal involvement: Secondary | ICD-10-CM | POA: Diagnosis not present

## 2023-09-30 DIAGNOSIS — H35033 Hypertensive retinopathy, bilateral: Secondary | ICD-10-CM | POA: Diagnosis not present

## 2023-10-05 ENCOUNTER — Encounter: Payer: Self-pay | Admitting: Internal Medicine

## 2023-10-19 ENCOUNTER — Ambulatory Visit: Payer: Medicare Other | Admitting: Internal Medicine

## 2023-10-24 ENCOUNTER — Other Ambulatory Visit: Payer: Self-pay | Admitting: Internal Medicine

## 2023-11-02 DIAGNOSIS — L820 Inflamed seborrheic keratosis: Secondary | ICD-10-CM | POA: Diagnosis not present

## 2023-11-02 DIAGNOSIS — L300 Nummular dermatitis: Secondary | ICD-10-CM | POA: Diagnosis not present

## 2023-11-02 DIAGNOSIS — L720 Epidermal cyst: Secondary | ICD-10-CM | POA: Diagnosis not present

## 2023-11-02 DIAGNOSIS — D485 Neoplasm of uncertain behavior of skin: Secondary | ICD-10-CM | POA: Diagnosis not present

## 2023-11-02 DIAGNOSIS — L570 Actinic keratosis: Secondary | ICD-10-CM | POA: Diagnosis not present

## 2023-11-02 DIAGNOSIS — L821 Other seborrheic keratosis: Secondary | ICD-10-CM | POA: Diagnosis not present

## 2023-11-02 DIAGNOSIS — L723 Sebaceous cyst: Secondary | ICD-10-CM | POA: Diagnosis not present

## 2023-11-11 ENCOUNTER — Other Ambulatory Visit: Payer: Self-pay | Admitting: Internal Medicine

## 2023-11-15 DIAGNOSIS — H43393 Other vitreous opacities, bilateral: Secondary | ICD-10-CM | POA: Diagnosis not present

## 2023-11-15 DIAGNOSIS — H353134 Nonexudative age-related macular degeneration, bilateral, advanced atrophic with subfoveal involvement: Secondary | ICD-10-CM | POA: Diagnosis not present

## 2023-11-15 DIAGNOSIS — H43813 Vitreous degeneration, bilateral: Secondary | ICD-10-CM | POA: Diagnosis not present

## 2023-11-15 DIAGNOSIS — H35033 Hypertensive retinopathy, bilateral: Secondary | ICD-10-CM | POA: Diagnosis not present

## 2023-11-15 DIAGNOSIS — H353124 Nonexudative age-related macular degeneration, left eye, advanced atrophic with subfoveal involvement: Secondary | ICD-10-CM | POA: Diagnosis not present

## 2023-11-17 DIAGNOSIS — J339 Nasal polyp, unspecified: Secondary | ICD-10-CM | POA: Diagnosis not present

## 2023-11-17 DIAGNOSIS — R430 Anosmia: Secondary | ICD-10-CM | POA: Diagnosis not present

## 2023-11-17 DIAGNOSIS — J45909 Unspecified asthma, uncomplicated: Secondary | ICD-10-CM | POA: Diagnosis not present

## 2023-11-17 DIAGNOSIS — J3489 Other specified disorders of nose and nasal sinuses: Secondary | ICD-10-CM | POA: Diagnosis not present

## 2023-11-26 ENCOUNTER — Encounter: Payer: Self-pay | Admitting: Family Medicine

## 2023-11-26 ENCOUNTER — Ambulatory Visit (INDEPENDENT_AMBULATORY_CARE_PROVIDER_SITE_OTHER): Admitting: Family Medicine

## 2023-11-26 VITALS — BP 140/80 | HR 67 | Temp 97.6°F | Resp 14 | Ht 66.54 in | Wt 183.4 lb

## 2023-11-26 DIAGNOSIS — J339 Nasal polyp, unspecified: Secondary | ICD-10-CM | POA: Diagnosis not present

## 2023-11-26 DIAGNOSIS — Z889 Allergy status to unspecified drugs, medicaments and biological substances status: Secondary | ICD-10-CM

## 2023-11-26 DIAGNOSIS — K219 Gastro-esophageal reflux disease without esophagitis: Secondary | ICD-10-CM

## 2023-11-26 DIAGNOSIS — J4489 Other specified chronic obstructive pulmonary disease: Secondary | ICD-10-CM | POA: Diagnosis not present

## 2023-11-26 DIAGNOSIS — H6123 Impacted cerumen, bilateral: Secondary | ICD-10-CM

## 2023-11-26 NOTE — Progress Notes (Unsigned)
 [address] Antonio Carlson [place] [address] Dept: [phone]  FOLLOW UP NOTE  Patient ID: Antonio Carlson, male    DOB: 1946-05-16  Age: 78 y.o. MRN: [id number] Date of Office Visit: 11/26/2023  Assessment  Chief Complaint: Follow-up (F/U per Dr.[name]. When walking has SOB. )  HPI Antonio Carlson is an 78 year old male who presents to the clinic for follow-up visit.  He was last seen in this clinic on 08/03/2021 by Dr. Burdette Carolin for evaluation of nasal polyposis, peripheral eosinophilia, aspirin exacerbated respiratory disease, and reflux.  At today's visit, he reports his asthma COPD overlap syndrome has been moderately well-controlled with symptoms including shortness of breath with activity as the main symptom.  He continues to budesonide  and Perforomist  and follows up with Dr. Waymond Hailey, pulmonary specialist, as recommended.  He continues to experience symptoms of nasal polyposis including anosmia and intermittent ageusia in addition to nasal congestion. He continues Flonase  1 spray in each nostril twice a day. Chart review indicates his most recent nasal surgery on 03/02/2023 with Dr. Linward Rick.  Prior to this he had nasal polypectomy in 1990 Cheryl Chay in 2011.  He has previously used Dupixent  for control of nasal polyposis and, after 3 injections of Dupixent , he began to experience joint pain in his side and neck as well as shortness of breath with minor physical activity.  His last Dupixent  injection was on 07/03/2021. He presented to the hospital on 08/01/2021 for evaluation of community acquired pneumonia with pleural effusion. At that visit, he was noted to have eosinophillia. Follow up in our clinic on 08/18/2021 with lab work indicating a downward trend of eosinophils with patient with clinical improvement.   He continues to avoid aspirin and aspirin containing compounds. He continues tylenol  for pain if needed.  Reflux is reported as well controlled with omeprazole  daily.   He denies ear pain  or change in hearing. He has had his ears cleaned previously.   Drug Allergies:  Allergies  Allergen Reactions   Aspirin Other (See Comments)    Pt states "I can't breath"   Clavulanic Acid     Other Reaction(s): Unknown   Sulfonamide Derivatives Rash    Other Reaction(s): Other  Doesn't remember   Amoxicillin     GI upset Did it involve swelling of the face/tongue/throat, SOB, or low BP? No Did it involve sudden or severe rash/hives, skin peeling, or any reaction on the inside of your mouth or nose? No Did you need to seek medical attention at a hospital or doctor's office? No When did it last happen?    20+ years   If all above answers are "NO", may proceed with cephalosporin use.     Physical Exam: BP (!) 140/80   Pulse 67   Temp 97.6 F (36.4 C)   Resp 14   Ht 5' 6.54" (1.69 m)   Wt 183 lb 6 oz (83.2 kg)   SpO2 97%   BMI 29.12 kg/m    Physical Exam Vitals reviewed.  Constitutional:      Appearance: Normal appearance.  HENT:     Head: Normocephalic and atraumatic.     Right Ear: Tympanic membrane normal.     Left Ear: Tympanic membrane normal.     Nose:     Comments: Bilateral nares edematous with this clear nasal drainage noted. Pharynx normal. Ears normal. Eyes normal.    Mouth/Throat:     Pharynx: Oropharynx is clear.  Eyes:     Conjunctiva/sclera: Conjunctivae  normal.  Cardiovascular:     Rate and Rhythm: Regular rhythm.     Heart sounds: Normal heart sounds. No murmur heard. Pulmonary:     Effort: Pulmonary effort is normal.     Breath sounds: Normal breath sounds.     Comments: Lungs clear to auscultation Musculoskeletal:        General: Normal range of motion.     Cervical back: Normal range of motion and neck supple.  Skin:    General: Skin is warm and dry.  Neurological:     Mental Status: He is alert and oriented to person, place, and time.  Psychiatric:        Mood and Affect: Mood normal.        Behavior: Behavior normal.        Thought  Content: Thought content normal.        Judgment: Judgment normal.     Assessment and Plan: 1. Nasal polyposis   2. Asthma-COPD overlap syndrome (HCC)   3. Drug allergy   4. Gastroesophageal reflux disease, unspecified whether esophagitis present   5. Bilateral impacted cerumen     No orders of the defined types were placed in this encounter.   Patient Instructions  Elevated eosinophils- now normalized If you start feeling any new symptoms call us  and let us  know.  Sinus/polyps Will submit for Xolair for nasal polyposis. You will next hear from Tammy with next steps Continue Flonase  (fluticasone ) nasal spray 1 spray per nostril twice a day as needed for nasal congestion.   Asthma:  Continue nebulizers as per pulmonology - next appointment on 12/22/2023 Daily controller medication(s): continue Pulmicort  and perforomist  nebulizer twice a day. May use albuterol  rescue inhaler 2 puffs every 4 to 6 hours as needed for shortness of breath, chest tightness, coughing, and wheezing. May use albuterol  rescue inhaler 2 puffs 5 to 15 minutes prior to strenuous physical activities. Monitor frequency of use.  Asthma control goals:  Full participation in all desired activities (may need albuterol  before activity) Albuterol  use two times or less a week on average (not counting use with activity) Cough interfering with sleep two times or less a month Oral steroids no more than once a year No hospitalizations   Drug allergies Continue to avoid NSADs - no Advil, ibuprofen, aleve. May take tylenol  for pain.  Heartburn: Continue omeprazole  20mg  daily as prescribed. Nothing to eat or drink for 30 minutes afterwards.  Cerumen impaction Begin Debrox to soften wax. Follow up with your primary care provider for further treatment  Follow up in 3 months or sooner if needed.  Return in about 3 months (around 02/26/2024).    Thank you for the opportunity to care for this patient.  Please do not  hesitate to contact me with questions.  Marinus Sic, FNP [facility]  

## 2023-11-26 NOTE — Patient Instructions (Addendum)
 Elevated eosinophils- now normalized If you start feeling any new symptoms call us  and let us  know.  Sinus/polyps Will submit for Xolair for nasal polyposis. You will next hear from Tammy with next steps Continue Flonase  (fluticasone ) nasal spray 1 spray per nostril twice a day as needed for nasal congestion.   Asthma:  Continue nebulizers as per pulmonology - next appointment on 12/22/2023 Daily controller medication(s): continue Pulmicort  and perforomist  nebulizer twice a day. May use albuterol  rescue inhaler 2 puffs every 4 to 6 hours as needed for shortness of breath, chest tightness, coughing, and wheezing. May use albuterol  rescue inhaler 2 puffs 5 to 15 minutes prior to strenuous physical activities. Monitor frequency of use.  Asthma control goals:  Full participation in all desired activities (may need albuterol  before activity) Albuterol  use two times or less a week on average (not counting use with activity) Cough interfering with sleep two times or less a month Oral steroids no more than once a year No hospitalizations   Drug allergies Continue to avoid NSADs - no Advil, ibuprofen, aleve. May take tylenol  for pain.  Heartburn: Continue omeprazole  20mg  daily as prescribed. Nothing to eat or drink for 30 minutes afterwards.  Cerumen impaction Begin Debrox to soften wax. Follow up with your primary care provider for further treatment  Follow up in 3 months or sooner if needed.

## 2023-11-28 ENCOUNTER — Encounter: Payer: Self-pay | Admitting: Family Medicine

## 2023-11-28 DIAGNOSIS — K219 Gastro-esophageal reflux disease without esophagitis: Secondary | ICD-10-CM | POA: Insufficient documentation

## 2023-11-28 DIAGNOSIS — Z889 Allergy status to unspecified drugs, medicaments and biological substances status: Secondary | ICD-10-CM | POA: Insufficient documentation

## 2023-11-28 DIAGNOSIS — J4489 Other specified chronic obstructive pulmonary disease: Secondary | ICD-10-CM | POA: Insufficient documentation

## 2023-11-28 DIAGNOSIS — H6123 Impacted cerumen, bilateral: Secondary | ICD-10-CM | POA: Insufficient documentation

## 2023-11-30 ENCOUNTER — Telehealth: Payer: Self-pay | Admitting: *Deleted

## 2023-11-30 NOTE — Telephone Encounter (Signed)
-----   Message from Marinus Sic sent at 11/28/2023  7:52 PM EDT ----- Hi there Antonio Carlson, Can you please submit this patient for Xolair for control of nasal polyposis? 3 prior nasal sx and has previously tried [facility] but had some arthralgia. Thank you

## 2023-11-30 NOTE — Telephone Encounter (Signed)
 L/m for patient to contact me regarding xolair for polyps. He has MCr and supplement so her would be buy & bill and can go ahead and schedule same

## 2023-12-01 DIAGNOSIS — H93293 Other abnormal auditory perceptions, bilateral: Secondary | ICD-10-CM | POA: Diagnosis not present

## 2023-12-01 DIAGNOSIS — H93A3 Pulsatile tinnitus, bilateral: Secondary | ICD-10-CM | POA: Diagnosis not present

## 2023-12-01 DIAGNOSIS — Z8709 Personal history of other diseases of the respiratory system: Secondary | ICD-10-CM | POA: Diagnosis not present

## 2023-12-01 DIAGNOSIS — R0982 Postnasal drip: Secondary | ICD-10-CM | POA: Diagnosis not present

## 2023-12-01 DIAGNOSIS — K219 Gastro-esophageal reflux disease without esophagitis: Secondary | ICD-10-CM | POA: Diagnosis not present

## 2023-12-12 ENCOUNTER — Other Ambulatory Visit: Payer: Self-pay | Admitting: Internal Medicine

## 2023-12-13 ENCOUNTER — Encounter: Payer: Self-pay | Admitting: Internal Medicine

## 2023-12-13 ENCOUNTER — Ambulatory Visit: Admitting: Internal Medicine

## 2023-12-13 VITALS — BP 136/62 | HR 69 | Ht 68.0 in | Wt 180.8 lb

## 2023-12-13 DIAGNOSIS — J339 Nasal polyp, unspecified: Secondary | ICD-10-CM

## 2023-12-13 DIAGNOSIS — R7303 Prediabetes: Secondary | ICD-10-CM | POA: Diagnosis not present

## 2023-12-13 DIAGNOSIS — J45909 Unspecified asthma, uncomplicated: Secondary | ICD-10-CM | POA: Diagnosis not present

## 2023-12-13 DIAGNOSIS — E782 Mixed hyperlipidemia: Secondary | ICD-10-CM | POA: Diagnosis not present

## 2023-12-13 DIAGNOSIS — I5032 Chronic diastolic (congestive) heart failure: Secondary | ICD-10-CM | POA: Diagnosis not present

## 2023-12-13 DIAGNOSIS — I10 Essential (primary) hypertension: Secondary | ICD-10-CM

## 2023-12-13 DIAGNOSIS — Z886 Allergy status to analgesic agent status: Secondary | ICD-10-CM

## 2023-12-13 DIAGNOSIS — E559 Vitamin D deficiency, unspecified: Secondary | ICD-10-CM | POA: Diagnosis not present

## 2023-12-13 DIAGNOSIS — J302 Other seasonal allergic rhinitis: Secondary | ICD-10-CM

## 2023-12-13 NOTE — Patient Instructions (Addendum)
 Please schedule Medicare Annual Wellness visit.  Please continue to take medications as prescribed.  Please continue to follow low salt diet and perform moderate exercise/walking as tolerated.

## 2023-12-13 NOTE — Assessment & Plan Note (Signed)
 Uses Flonase  and Astelin  nasal spray On Singulair  10 mg QD Followed by allergy clinic

## 2023-12-13 NOTE — Assessment & Plan Note (Signed)
 On statin and Zetia Check lipid profile

## 2023-12-13 NOTE — Assessment & Plan Note (Signed)
 Lab Results  Component Value Date   HGBA1C 5.8 (H) 04/27/2019   Advised to follow low carb diet

## 2023-12-13 NOTE — Assessment & Plan Note (Signed)
 Usually well controlled with Pulmicort  and Perforomist  neb Did not tolerate Dupixent  On Singulair  Followed by Dr Waymond Hailey

## 2023-12-13 NOTE — Progress Notes (Signed)
 Established Patient Office Visit  Subjective:  Patient ID: Antonio Carlson, male    DOB: [birth year]  Age: 78 y.o. MRN: [id number]  CC:  Chief Complaint  Patient presents with   Medical Management of Chronic Issues    4 month f/u    HPI Antonio Carlson is a 78 y.o. male with past medical history of HTN, CAD s/p CABG, asthma, allergic rhinitis and OA of hip who presents for f/u of his chronic medical conditions.  HTN: BP is well-controlled. Takes Telmisartan  80 mg QD and Metoprolol  25 mg BID regularly. Patient denies headache, dizziness, or palpitations.  He was given spironolactone  for HFpEF by his Cardiologist, but had hyperkalemia with it. He has Lasix  for as needed use for leg swelling.  CAD s/p CABG: He is on Plavix  and statin currently.  He also takes metoprolol  for it.  He denies any anginal chest pain or dyspnea currently.  He has history of asthma and allergic rhinitis, for which he uses Perforomist  and Pulmicort  neb. He has been evaluated by pulmonology. Denies any fever or chills.  He denies any dyspnea or wheezing currently.  He follows up with Dr Waymond Hailey.  He has tried Dupixent , but had an allergic reaction to it.  He denies any fever or chills.  He has chronic sinusitis and nasal polyps, and had nasal surgery by [facility] ENT specialist in 08/24. He recently saw [facility] ENT specialist for pulsatile tinnitus.  He is going to get hearing evaluation.   Past Medical History:  Diagnosis Date   Allergy    Anginal pain (HCC)    Arthritis    Asthma    Benign prostatic hypertrophy    Coronary artery disease    Diverticulosis    Dyspnea    GERD (gastroesophageal reflux disease)    History of kidney stones    Hyperlipidemia    Hypertension    Nephrolithiasis    Pleural effusion on right    Pneumonia    PVC's (premature ventricular contractions)    RECTAL BLEEDING 09/15/2010   Qualifier: Diagnosis of  By: Francisca Irvine PA-c, Amy S    Shingles    twice   Syncope     Past  Surgical History:  Procedure Laterality Date   APPENDECTOMY     CORONARY ARTERY BYPASS GRAFT N/A 05/01/2019   Procedure: CORONARY ARTERY BYPASS GRAFTING (CABG) TIMES FOUR USING LEFT AND RIGHT INTERNAL MAMMARY ARTERIES AND RIGHT RADIAL ARTERY WITH STERNAL PLATING;  Surgeon: Rudine Cos, MD;  Location: [facility] OR;  Service: Open Heart Surgery;  Laterality: N/A;   EYE SURGERY Bilateral    catartact surgery   LEFT HEART CATH AND CORONARY ANGIOGRAPHY N/A 04/18/2019   Procedure: LEFT HEART CATH AND CORONARY ANGIOGRAPHY;  Surgeon: Arty Binning, MD;  Location: [facility] INVASIVE CV LAB;  Service: Cardiovascular;  Laterality: N/A;   NASAL SINUS SURGERY  03/28/2011   NASAL SINUS SURGERY  03/12/2023   RADIAL ARTERY HARVEST Right 05/01/2019   Procedure: Right Radial Artery Harvest;  Surgeon: Rudine Cos, MD;  Location: [facility] OR;  Service: Open Heart Surgery;  Laterality: Right;   TEE WITHOUT CARDIOVERSION N/A 05/01/2019   Procedure: TRANSESOPHAGEAL ECHOCARDIOGRAM (TEE);  Surgeon: Rudine Cos, MD;  Location: [facility] OR;  Service: Open Heart Surgery;  Laterality: N/A;   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   Procedure: TOTAL HIP ARTHROPLASTY ANTERIOR APPROACH;  Surgeon: Liliane Rei, MD;  Location: [facility] ORS;  Service: Orthopedics;  Laterality: Left;    Family History  Problem Relation Age of Onset   Heart attack Father     Social History   Socioeconomic History   Marital status: Married    Spouse name: Not on file   Number of children: Not on file   Years of education: Not on file   Highest education level: Not on file  Occupational History   Not on file  Tobacco Use   Smoking status: Former    Current packs/day: 0.00    Average packs/day: 2.0 packs/day for 14.0 years (28.0 ttl pk-yrs)    Types: Cigarettes    Start date: 07/28/1955    Quit date: 07/27/1969    Years since quitting: 54.4   Smokeless tobacco: Never  Vaping Use   Vaping status: Never Used  Substance and Sexual Activity    Alcohol use: Yes    Comment: beer occ   Drug use: Never   Sexual activity: Yes  Other Topics Concern   Not on file  Social History Narrative   Not on file   Social Drivers of Health   Financial Resource Strain: Low Risk  (01/07/2022)   Overall Financial Resource Strain (CARDIA)    Difficulty of Paying Living Expenses: Not hard at all  Food Insecurity: No Food Insecurity (05/27/2022)   Hunger Vital Sign    Worried About Running Out of Food in the Last Year: Never true    Ran Out of Food in the Last Year: Never true  Transportation Needs: No Transportation Needs (05/27/2022)   PRAPARE - Administrator, Civil Service (Medical): No    Lack of Transportation (Non-Medical): No  Physical Activity: Insufficiently Active (01/13/2023)   Exercise Vital Sign    Days of Exercise per Week: 7 days    Minutes of Exercise per Session: 10 min  Stress: No Stress Concern Present (03/12/2023)   Received from [facility] of Occupational Health - Occupational Stress Questionnaire    Feeling of Stress : Only a little  Social Connections: Unknown (03/12/2023)   Received from [facility]   Social Network    Social Network: Not on file  Intimate Partner Violence: Unknown (03/12/2023)   Received from [facility]   HITS    Physically Hurt: Not on file    Insult or Talk Down To: Not on file    Threaten Physical Harm: Not on file    Scream or Curse: Not on file    Outpatient Medications Prior to Visit  Medication Sig Dispense Refill   acetaminophen  (TYLENOL ) 500 MG tablet Take 500-1,000 mg by mouth every 6 (six) hours as needed for moderate pain or headache.     albuterol  (PROVENTIL ) (2.5 MG/3ML) 0.083% nebulizer solution Take 3 mLs (2.5 mg total) by nebulization every 4 (four) hours as needed for wheezing or shortness of breath. 75 mL 12   albuterol  (VENTOLIN  HFA) 108 (90 Base) MCG/ACT inhaler Inhale 2 puffs into the lungs every 6 (six) hours as needed for wheezing or  shortness of breath. 8 g 0   alfuzosin (UROXATRAL) 10 MG 24 hr tablet Take 10 mg by mouth daily.     atorvastatin  (LIPITOR ) 80 MG tablet TAKE 1 TABLET BY MOUTH DAILY AT 6 PM. 90 tablet 2   budesonide  (PULMICORT ) 0.5 MG/2ML nebulizer solution Take 2 mLs (0.5 mg total) by nebulization daily. 60 mL 11   CALCIUM  PO Take 1,200 mg by mouth daily.     clopidogrel  (PLAVIX ) 75 MG tablet TAKE 1 TABLET BY MOUTH EVERY  DAY 90 tablet 3   ezetimibe  (ZETIA ) 10 MG tablet Take 1 tablet (10 mg total) by mouth daily. 90 tablet 3   famotidine  (PEPCID ) 20 MG tablet One after supper 30 tablet 11   fluticasone  (FLONASE ) 50 MCG/ACT nasal spray Place 2 sprays into both nostrils daily. 16 g 6   formoterol  (PERFOROMIST ) 20 MCG/2ML nebulizer solution Take 2 mLs (20 mcg total) by nebulization 2 (two) times daily. Asthma J45.909 360 mL 11   furosemide  (LASIX ) 20 MG tablet Take 1 tablet (20 mg total) by mouth daily as needed. 90 tablet 3   metoprolol  tartrate (LOPRESSOR ) 50 MG tablet TAKE 1/2 TABLET TWICE A DAY BY MOUTH 90 tablet 0   montelukast  (SINGULAIR ) 10 MG tablet TAKE 1 TABLET BY MOUTH EVERYDAY AT BEDTIME 90 tablet 4   Multiple Vitamins-Minerals (ONE-A-DAY 50 PLUS PO) Take by mouth.     Multiple Vitamins-Minerals (PRESERVISION AREDS 2 PO) Take by mouth.     pantoprazole  (PROTONIX ) 40 MG tablet TAKE 1 TABLET BY MOUTH EVERY DAY 90 tablet 3   telmisartan  (MICARDIS ) 80 MG tablet TAKE 1 TABLET BY MOUTH EVERY DAY 90 tablet 3   predniSONE  (DELTASONE ) 10 MG tablet 2 each am until better then 1 daily x 5 days and stop     No facility-administered medications prior to visit.    Allergies  Allergen Reactions   Aspirin Other (See Comments)    Pt states "I can't breath"   Clavulanic Acid     Other Reaction(s): Unknown   Sulfonamide Derivatives Rash    Other Reaction(s): Other  Doesn't remember   Amoxicillin     GI upset Did it involve swelling of the face/tongue/throat, SOB, or low BP? No Did it involve sudden or  severe rash/hives, skin peeling, or any reaction on the inside of your mouth or nose? No Did you need to seek medical attention at a hospital or doctor's office? No When did it last happen?    20+ years   If all above answers are "NO", may proceed with cephalosporin use.     ROS Review of Systems  Constitutional:  Negative for chills and fever.  HENT:  Positive for congestion and postnasal drip. Negative for sore throat.   Eyes:  Negative for pain and discharge.  Respiratory:  Positive for cough (Intermittent). Negative for shortness of breath.   Cardiovascular:  Positive for leg swelling. Negative for chest pain and palpitations.  Gastrointestinal:  Negative for diarrhea, nausea and vomiting.  Endocrine: Negative for polydipsia and polyuria.  Genitourinary:  Negative for dysuria and hematuria.  Musculoskeletal:  Positive for arthralgias and back pain. Negative for neck pain and neck stiffness.  Skin:  Negative for rash.  Neurological:  Positive for numbness. Negative for dizziness, weakness and headaches.  Psychiatric/Behavioral:  Negative for agitation and behavioral problems.       Objective:    Physical Exam Vitals reviewed.  Constitutional:      General: He is not in acute distress.    Appearance: He is not diaphoretic.  HENT:     Head: Normocephalic and atraumatic.     Nose: Congestion present.     Mouth/Throat:     Mouth: Mucous membranes are moist.  Eyes:     General: No scleral icterus.    Extraocular Movements: Extraocular movements intact.  Cardiovascular:     Rate and Rhythm: Normal rate and regular rhythm.     Heart sounds: Normal heart sounds. No murmur heard. Pulmonary:  Breath sounds: Normal breath sounds. No wheezing or rales.  Musculoskeletal:     Cervical back: Neck supple. No tenderness.     Right lower leg: No edema.     Left lower leg: No edema.  Skin:    General: Skin is warm.     Findings: No rash.  Neurological:     General: No focal  deficit present.     Mental Status: He is alert and oriented to person, place, and time.  Psychiatric:        Mood and Affect: Mood normal.        Behavior: Behavior normal.     BP 136/62 (BP Location: Left Arm)   Pulse 69   Ht 5\' 8"  (1.727 m)   Wt 180 lb 12.8 oz (82 kg)   SpO2 95%   BMI 27.49 kg/m  Wt Readings from Last 3 Encounters:  12/13/23 180 lb 12.8 oz (82 kg)  11/26/23 183 lb 6 oz (83.2 kg)  09/20/23 182 lb 3.2 oz (82.6 kg)    Lab Results  Component Value Date   TSH 1.17 07/01/2021   Lab Results  Component Value Date   WBC 11.3 (H) 06/05/2023   HGB 13.9 06/05/2023   HCT 41.8 06/05/2023   MCV 82.9 06/05/2023   PLT 286 06/05/2023   Lab Results  Component Value Date   NA 138 06/05/2023   K 4.2 06/05/2023   CO2 25 06/05/2023   GLUCOSE 122 (H) 06/05/2023   BUN 17 06/05/2023   CREATININE 1.02 06/05/2023   BILITOT 0.3 09/04/2022   ALKPHOS 132 (H) 09/04/2022   AST 36 09/04/2022   ALT 35 09/04/2022   PROT 6.7 09/04/2022   ALBUMIN  4.6 09/04/2022   CALCIUM  8.9 06/05/2023   ANIONGAP 10 06/05/2023   EGFR 64 03/05/2023   GFR 78.99 07/01/2021   Lab Results  Component Value Date   CHOL 133 09/04/2022   Lab Results  Component Value Date   HDL 61 09/04/2022   Lab Results  Component Value Date   LDLCALC 58 09/04/2022   Lab Results  Component Value Date   TRIG 67 09/04/2022   Lab Results  Component Value Date   CHOLHDL 2.2 09/04/2022   Lab Results  Component Value Date   HGBA1C 5.8 (H) 04/27/2019      Assessment & Plan:   Problem List Items Addressed This Visit       Cardiovascular and Mediastinum   Essential hypertension (Chronic)   BP Readings from Last 1 Encounters:  12/13/23 136/62   Well-controlled with Telmisartan  80 mg QD and Metoprolol  25 mg BID Counseled for compliance with the medications Advised DASH diet and moderate exercise/walking as tolerated      Chronic heart failure with preserved ejection fraction (HCC)   Echo  reviewed - Grade 1 diastolic dysfunction. Recent cardiology visit note reviewed On metoprolol , telmisartan  and as needed Lasix  Had hyperkalemia with spironolactone  Has chronic leg swelling, improved with Lasix         Respiratory   Triad asthma - Primary (Chronic)   Usually well controlled with Pulmicort  and Perforomist  neb Did not tolerate Dupixent  On Singulair  Followed by Dr Waymond Hailey      Allergic rhinitis   Uses Flonase  and Astelin  nasal spray On Singulair  10 mg QD Followed by allergy clinic        Other   Hyperlipidemia   On statin and Zetia  Check lipid profile      Prediabetes   Lab Results  Component Value  Date   HGBA1C 5.8 (H) 04/27/2019   Advised to follow low carb diet          No orders of the defined types were placed in this encounter.   Follow-up: Return in about 6 months (around 06/14/2024) for HTN and asthma.    Meldon Sport, MD

## 2023-12-13 NOTE — Assessment & Plan Note (Addendum)
 BP Readings from Last 1 Encounters:  12/13/23 136/62   Well-controlled with Telmisartan  80 mg QD and Metoprolol  25 mg BID Counseled for compliance with the medications Advised DASH diet and moderate exercise/walking as tolerated

## 2023-12-13 NOTE — Assessment & Plan Note (Signed)
 Echo reviewed - Grade 1 diastolic dysfunction. Recent cardiology visit note reviewed On metoprolol, telmisartan and as needed Lasix On spironolactone Has chronic leg swelling, improved with Lasix

## 2023-12-14 ENCOUNTER — Ambulatory Visit: Payer: Self-pay | Admitting: Internal Medicine

## 2023-12-14 LAB — LIPID PANEL
Chol/HDL Ratio: 2.7 ratio (ref 0.0–5.0)
Cholesterol, Total: 124 mg/dL (ref 100–199)
HDL: 46 mg/dL (ref >39)
LDL Chol Calc (NIH): 61 mg/dL (ref 0–99)
Triglycerides: 90 mg/dL (ref 0–149)
VLDL Cholesterol Cal: 17 mg/dL (ref 5–40)

## 2023-12-14 LAB — CMP14+EGFR
ALT: 18 IU/L (ref 0–44)
AST: 23 IU/L (ref 0–40)
Albumin: 4 g/dL (ref 3.8–4.8)
Alkaline Phosphatase: 142 IU/L — ABNORMAL HIGH (ref 44–121)
BUN/Creatinine Ratio: 17 (ref 10–24)
BUN: 18 mg/dL (ref 8–27)
Bilirubin Total: 0.5 mg/dL (ref 0.0–1.2)
CO2: 25 mmol/L (ref 20–29)
Calcium: 9.2 mg/dL (ref 8.6–10.2)
Chloride: 104 mmol/L (ref 96–106)
Creatinine, Ser: 1.05 mg/dL (ref 0.76–1.27)
Globulin, Total: 2.2 g/dL (ref 1.5–4.5)
Glucose: 93 mg/dL (ref 70–99)
Potassium: 5 mmol/L (ref 3.5–5.2)
Sodium: 142 mmol/L (ref 134–144)
Total Protein: 6.2 g/dL (ref 6.0–8.5)
eGFR: 72 mL/min/{1.73_m2} (ref >59)

## 2023-12-14 LAB — CBC WITH DIFFERENTIAL/PLATELET
Basophils Absolute: 0.1 10*3/uL (ref 0.0–0.2)
Basos: 1 %
EOS (ABSOLUTE): 0.9 10*3/uL — ABNORMAL HIGH (ref 0.0–0.4)
Eos: 9 %
Hematocrit: 46.2 % (ref 37.5–51.0)
Hemoglobin: 14.1 g/dL (ref 13.0–17.7)
Immature Grans (Abs): 0 10*3/uL (ref 0.0–0.1)
Immature Granulocytes: 0 %
Lymphocytes Absolute: 2.1 10*3/uL (ref 0.7–3.1)
Lymphs: 20 %
MCH: 26.4 pg — ABNORMAL LOW (ref 26.6–33.0)
MCHC: 30.5 g/dL — ABNORMAL LOW (ref 31.5–35.7)
MCV: 87 fL (ref 79–97)
Monocytes Absolute: 0.9 10*3/uL (ref 0.1–0.9)
Monocytes: 9 %
Neutrophils Absolute: 6.6 10*3/uL (ref 1.4–7.0)
Neutrophils: 61 %
Platelets: 246 10*3/uL (ref 150–450)
RBC: 5.34 x10E6/uL (ref 4.14–5.80)
RDW: 15 % (ref 11.6–15.4)
WBC: 10.6 10*3/uL (ref 3.4–10.8)

## 2023-12-14 LAB — TSH: TSH: 1.68 u[IU]/mL (ref 0.450–4.500)

## 2023-12-14 LAB — HEMOGLOBIN A1C
Est. average glucose Bld gHb Est-mCnc: 120 mg/dL
Hgb A1c MFr Bld: 5.8 % — ABNORMAL HIGH (ref 4.8–5.6)

## 2023-12-14 LAB — VITAMIN D 25 HYDROXY (VIT D DEFICIENCY, FRACTURES): Vit D, 25-Hydroxy: 36.5 ng/mL (ref 30.0–100.0)

## 2023-12-21 ENCOUNTER — Ambulatory Visit: Payer: Medicare Other | Admitting: Podiatry

## 2023-12-22 ENCOUNTER — Ambulatory Visit: Payer: Medicare Other | Admitting: Internal Medicine

## 2023-12-27 ENCOUNTER — Ambulatory Visit: Admitting: Podiatry

## 2023-12-29 ENCOUNTER — Encounter: Payer: Self-pay | Admitting: Internal Medicine

## 2023-12-29 ENCOUNTER — Encounter: Payer: Self-pay | Admitting: Podiatry

## 2023-12-29 ENCOUNTER — Ambulatory Visit (INDEPENDENT_AMBULATORY_CARE_PROVIDER_SITE_OTHER): Admitting: Podiatry

## 2023-12-29 DIAGNOSIS — M79675 Pain in left toe(s): Secondary | ICD-10-CM

## 2023-12-29 DIAGNOSIS — B351 Tinea unguium: Secondary | ICD-10-CM

## 2023-12-29 DIAGNOSIS — M79674 Pain in right toe(s): Secondary | ICD-10-CM | POA: Diagnosis not present

## 2023-12-29 NOTE — Progress Notes (Signed)
  Subjective:  Patient ID: TEAL BONTRAGER, male    DOB: 1945-10-15,  MRN: [id number]  Chief Complaint  Patient presents with   Nail Problem    RM 24 Patient is here for routine foot care and nail trimming.    78 y.o. male presents concern of thickened elongated and painful nails that are difficult to trim. Requesting to have them trimmed today. Patient is on plavix  and at risk for foot care.    Objective:  Physical Exam: warm, good capillary refill, no trophic changes or ulcerative lesions, normal DP and PT pulses, and normal sensory exam. Left Foot: dystrophic yellowed discolored nail plates with subungual debris Right Foot: dystrophic yellowed discolored nail plates with subungual debris  Assessment:   1. Pain due to onychomycosis of toenails of both feet        Plan:  Patient was evaluated and treated and all questions answered. -Mechanically debrided all nails 1-5 bilateral using sterile nail nipper and filed with dremel without incident  -Answered all patient questions -Patient to return  in 3 months for at risk foot care -Patient advised to call the office if any problems or questions arise in the meantime.     Return in about 3 months (around 03/30/2024) for rfc.

## 2023-12-31 DIAGNOSIS — H906 Mixed conductive and sensorineural hearing loss, bilateral: Secondary | ICD-10-CM | POA: Diagnosis not present

## 2023-12-31 DIAGNOSIS — H6993 Unspecified Eustachian tube disorder, bilateral: Secondary | ICD-10-CM | POA: Diagnosis not present

## 2023-12-31 DIAGNOSIS — H93A3 Pulsatile tinnitus, bilateral: Secondary | ICD-10-CM | POA: Diagnosis not present

## 2024-01-07 ENCOUNTER — Other Ambulatory Visit: Payer: Self-pay | Admitting: Internal Medicine

## 2024-01-10 NOTE — Telephone Encounter (Signed)
 L/m for patient to contact me again need to confirm Ins coverage

## 2024-01-12 DIAGNOSIS — H93A3 Pulsatile tinnitus, bilateral: Secondary | ICD-10-CM | POA: Diagnosis not present

## 2024-01-12 DIAGNOSIS — H748X3 Other specified disorders of middle ear and mastoid, bilateral: Secondary | ICD-10-CM | POA: Diagnosis not present

## 2024-01-12 DIAGNOSIS — J3489 Other specified disorders of nose and nasal sinuses: Secondary | ICD-10-CM | POA: Diagnosis not present

## 2024-01-12 DIAGNOSIS — Z9889 Other specified postprocedural states: Secondary | ICD-10-CM | POA: Diagnosis not present

## 2024-01-12 DIAGNOSIS — J339 Nasal polyp, unspecified: Secondary | ICD-10-CM | POA: Diagnosis not present

## 2024-01-13 ENCOUNTER — Ambulatory Visit: Payer: Medicare Other

## 2024-01-13 VITALS — Ht 68.0 in | Wt 176.0 lb

## 2024-01-13 DIAGNOSIS — Z0000 Encounter for general adult medical examination without abnormal findings: Secondary | ICD-10-CM | POA: Diagnosis not present

## 2024-01-13 NOTE — Patient Instructions (Signed)
 Antonio Carlson , Thank you for taking time out of your busy schedule to complete your Annual Wellness Visit with me. I enjoyed our conversation and look forward to speaking with you again next year. I, as well as your care team,  appreciate your ongoing commitment to your health goals. Please review the following plan we discussed and let me know if I can assist you in the future. Your Game plan/ To Do List    Referrals: If you haven't heard from the office you've been referred to, please reach out to them at the phone provided.  None placed this visit  Follow up Visits: Next Medicare AWV with our clinical staff: January 16, 2025 at 10:00 am telephone visit   Have you seen your provider in the last 6 months (3 months if uncontrolled diabetes)? Yes Next Office Visit with your provider: November, 19, 2025 at 10:00 am in office   Clinician Recommendations:  Aim for 30 minutes of exercise or brisk walking, 6-8 glasses of water , and 5 servings of fruits and vegetables each day.       This is a list of the screening recommended for you and due dates:  Health Maintenance  Topic Date Due   COVID-19 Vaccine (3 - Pfizer risk series) 12/12/2019   Flu Shot  02/25/2024   Medicare Annual Wellness Visit  01/12/2025   DTaP/Tdap/Td vaccine (2 - Td or Tdap) 07/31/2025   Pneumococcal Vaccine for age over 9  Completed   Zoster (Shingles) Vaccine  Completed   HPV Vaccine  Aged Out   Meningitis B Vaccine  Aged Out   Colon Cancer Screening  Discontinued    Advanced directives: (Copy Requested) Please bring a copy of your health care power of attorney and living will to the office to be added to your chart at your convenience. You can mail to [facility] [address] [place], [place] [address] or email to [email or web address] Advance Care Planning is important because it:  [x]  Makes sure you receive the medical care that is consistent with your values, goals, and preferences  [x]  It  provides guidance to your family and loved ones and reduces their decisional burden about whether or not they are making the right decisions based on your wishes.  Follow the link provided in your after visit summary or read over the paperwork we have mailed to you to help you started getting your Advance Directives in place. If you need assistance in completing these, please reach out to us  so that we can help you!  Understanding Your Risk for Falls Millions of people have serious injuries from falls each year. It is important to understand your risk of falling. Talk with your health care provider about your risk and what you can do to lower it. If you do have a serious fall, make sure to tell your provider. Falling once raises your risk of falling again. How can falls affect me? Serious injuries from falls are common. These include: Broken bones, such as hip fractures. Head injuries, such as traumatic brain injuries (TBI) or concussions. A fear of falling can cause you to avoid activities and stay at home. This can make your muscles weaker and raise your risk for a fall. What can increase my risk? There are a number of risk factors that increase your risk for falling. The more risk factors you have, the higher your risk of falling. Serious injuries from a fall happen most often to people who are  older than 80 years old. Teenagers and young adults ages 58-29 are also at higher risk. Common risk factors include: Weakness in the lower body. Being generally weak or confused due to long-term (chronic) illness. Dizziness or balance problems. Poor vision. Medicines that cause dizziness or drowsiness. These may include: Medicines for your blood pressure, heart, anxiety, insomnia, or swelling (edema). Pain medicines. Muscle relaxants. Other risk factors include: Drinking alcohol. Having had a fall in the past. Having foot pain or wearing improper footwear. Working at a dangerous job. Having any  of the following in your home: Tripping hazards, such as floor clutter or loose rugs. Poor lighting. Pets. Having dementia or memory loss. What actions can I take to lower my risk of falling?     Physical activity Stay physically fit. Do strength and balance exercises. Consider taking a regular class to build strength and balance. Yoga and tai chi are good options. Vision Have your eyes checked every year and your prescription for glasses or contacts updated as needed. Shoes and walking aids Wear non-skid shoes. Wear shoes that have rubber soles and low heels. Do not wear high heels. Do not walk around the house in socks or slippers. Use a cane or walker as told by your provider. Home safety Attach secure railings on both sides of your stairs. Install grab bars for your bathtub, shower, and toilet. Use a non-skid mat in your bathtub or shower. Attach bath mats securely with double-sided, non-slip rug tape. Use good lighting in all rooms. Keep a flashlight near your bed. Make sure there is a clear path from your bed to the bathroom. Use night-lights. Do not use throw rugs. Make sure all carpeting is taped or tacked down securely. Remove all clutter from walkways and stairways, including extension cords. Repair uneven or broken steps and floors. Avoid walking on icy or slippery surfaces. Walk on the grass instead of on icy or slick sidewalks. Use ice melter to get rid of ice on walkways in the winter. Use a cordless phone. Questions to ask your health care provider Can you help me check my risk for a fall? Do any of my medicines make me more likely to fall? Should I take a vitamin D  supplement? What exercises can I do to improve my strength and balance? Should I make an appointment to have my vision checked? Do I need a bone density test to check for weak bones (osteoporosis)? Would it help to use a cane or a walker? Where to find more information Centers for Disease Control and  Prevention, STEADI: TonerPromos.no Community-Based Fall Prevention Programs: TonerPromos.no [employer] on Aging: BaseRingTones.pl Contact a health care provider if: You fall at home. You are afraid of falling at home. You feel weak, drowsy, or dizzy. This information is not intended to replace advice given to you by your health care provider. Make sure you discuss any questions you have with your health care provider. Document Revised: 03/16/2022 Document Reviewed: 03/16/2022 Elsevier Patient Education  2024 [employer].

## 2024-01-13 NOTE — Progress Notes (Signed)
 Subjective:   Antonio Carlson is a 78 y.o. who presents for a Medicare Wellness preventive visit.  As a reminder, Annual Wellness Visits don't include a physical exam, and some assessments may be limited, especially if this visit is performed virtually. We may recommend an in-person follow-up visit with your provider if needed.  Visit Complete: Virtual I connected with  Antonio Carlson on 01/13/24 by a audio enabled telemedicine application and verified that I am speaking with the correct person using two identifiers.  Patient Location: Home  Provider Location: Home Office  I discussed the limitations of evaluation and management by telemedicine. The patient expressed understanding and agreed to proceed.  Vital Signs: Because this visit was a virtual/telehealth visit, some criteria may be missing or patient reported. Any vitals not documented were not able to be obtained and vitals that have been documented are patient reported.  VideoDeclined- This patient declined Librarian, academic. Therefore the visit was completed with audio only.  Persons Participating in Visit: Patient.  AWV Questionnaire: No: Patient Medicare AWV questionnaire was not completed prior to this visit.  Cardiac Risk Factors include: advanced age (>51men, >13 women);hypertension;male gender;dyslipidemia     Objective:    Today's Vitals   01/13/24 1642  Weight: 176 lb (79.8 kg)  Height: 5' 8 (1.727 m)  PainSc: 0-No pain   Body mass index is 26.76 kg/m.     01/13/2024    4:43 PM 06/05/2023    8:15 AM 04/19/2023    1:22 PM 01/13/2023    1:22 PM 05/27/2022    6:32 PM 05/20/2022    8:29 AM 01/07/2022    1:31 PM  Advanced Directives  Does Patient Have a Medical Advance Directive? Yes Yes No No No No No  Type of Estate agent of [place];Living will Healthcare Power of Attorney       Does patient want to make changes to medical advance directive?  No - Patient  declined       Copy of Healthcare Power of Attorney in Chart? No - copy requested No - copy requested       Would patient like information on creating a medical advance directive?    No - Patient declined Yes (MAU/Ambulatory/Procedural Areas - Information given) Yes (MAU/Ambulatory/Procedural Areas - Information given) Yes (MAU/Ambulatory/Procedural Areas - Information given)    Current Medications (verified) Outpatient Encounter Medications as of 01/13/2024  Medication Sig   acetaminophen  (TYLENOL ) 500 MG tablet Take 500-1,000 mg by mouth every 6 (six) hours as needed for moderate pain or headache.   albuterol  (PROVENTIL ) (2.5 MG/3ML) 0.083% nebulizer solution Take 3 mLs (2.5 mg total) by nebulization every 4 (four) hours as needed for wheezing or shortness of breath.   albuterol  (VENTOLIN  HFA) 108 (90 Base) MCG/ACT inhaler Inhale 2 puffs into the lungs every 6 (six) hours as needed for wheezing or shortness of breath.   alfuzosin (UROXATRAL) 10 MG 24 hr tablet Take 10 mg by mouth daily.   atorvastatin  (LIPITOR ) 80 MG tablet TAKE 1 TABLET BY MOUTH DAILY AT 6 PM.   budesonide  (PULMICORT ) 0.5 MG/2ML nebulizer solution Take 2 mLs (0.5 mg total) by nebulization daily.   CALCIUM  PO Take 1,200 mg by mouth daily.   clopidogrel  (PLAVIX ) 75 MG tablet TAKE 1 TABLET BY MOUTH EVERY DAY   ezetimibe  (ZETIA ) 10 MG tablet Take 1 tablet (10 mg total) by mouth daily.   famotidine  (PEPCID ) 20 MG tablet One after supper   fluticasone  (  FLONASE ) 50 MCG/ACT nasal spray Place 2 sprays into both nostrils daily.   formoterol  (PERFOROMIST ) 20 MCG/2ML nebulizer solution Take 2 mLs (20 mcg total) by nebulization 2 (two) times daily. Asthma J45.909   furosemide  (LASIX ) 20 MG tablet Take 1 tablet (20 mg total) by mouth daily as needed.   metoprolol  tartrate (LOPRESSOR ) 50 MG tablet TAKE 1/2 TABLET TWICE A DAY BY MOUTH   montelukast  (SINGULAIR ) 10 MG tablet TAKE 1 TABLET BY MOUTH EVERYDAY AT BEDTIME   Multiple  Vitamins-Minerals (ONE-A-DAY 50 PLUS PO) Take by mouth.   Multiple Vitamins-Minerals (PRESERVISION AREDS 2 PO) Take by mouth.   pantoprazole  (PROTONIX ) 40 MG tablet TAKE 1 TABLET BY MOUTH EVERY DAY   telmisartan  (MICARDIS ) 80 MG tablet TAKE 1 TABLET BY MOUTH EVERY DAY   No facility-administered encounter medications on file as of 01/13/2024.    Allergies (verified) Aspirin, Clavulanic acid, Sulfonamide derivatives, and Amoxicillin   History: Past Medical History:  Diagnosis Date   Allergy    Anginal pain (HCC)    Arthritis    Asthma    Benign prostatic hypertrophy    Coronary artery disease    Diverticulosis    Dyspnea    GERD (gastroesophageal reflux disease)    History of kidney stones    Hyperlipidemia    Hypertension    Nephrolithiasis    Pleural effusion on right    Pneumonia    PVC's (premature ventricular contractions)    RECTAL BLEEDING 09/15/2010   Qualifier: Diagnosis of  By: Francisca Irvine PA-c, Amy S    Shingles    twice   Syncope    Past Surgical History:  Procedure Laterality Date   APPENDECTOMY     CORONARY ARTERY BYPASS GRAFT N/A 05/01/2019   Procedure: CORONARY ARTERY BYPASS GRAFTING (CABG) TIMES FOUR USING LEFT AND RIGHT INTERNAL MAMMARY ARTERIES AND RIGHT RADIAL ARTERY WITH STERNAL PLATING;  Surgeon: Rudine Cos, MD;  Location: [facility] OR;  Service: Open Heart Surgery;  Laterality: N/A;   EYE SURGERY Bilateral    catartact surgery   LEFT HEART CATH AND CORONARY ANGIOGRAPHY N/A 04/18/2019   Procedure: LEFT HEART CATH AND CORONARY ANGIOGRAPHY;  Surgeon: Arty Binning, MD;  Location: [facility] INVASIVE CV LAB;  Service: Cardiovascular;  Laterality: N/A;   NASAL SINUS SURGERY  03/28/2011   NASAL SINUS SURGERY  03/12/2023   RADIAL ARTERY HARVEST Right 05/01/2019   Procedure: Right Radial Artery Harvest;  Surgeon: Rudine Cos, MD;  Location: [facility] OR;  Service: Open Heart Surgery;  Laterality: Right;   TEE WITHOUT CARDIOVERSION N/A 05/01/2019   Procedure:  TRANSESOPHAGEAL ECHOCARDIOGRAM (TEE);  Surgeon: Rudine Cos, MD;  Location: [facility] OR;  Service: Open Heart Surgery;  Laterality: N/A;   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   Procedure: TOTAL HIP ARTHROPLASTY ANTERIOR APPROACH;  Surgeon: Liliane Rei, MD;  Location: [facility] ORS;  Service: Orthopedics;  Laterality: Left;   Family History  Problem Relation Age of Onset   Heart attack Father    Social History   Socioeconomic History   Marital status: Married    Spouse name: Not on file   Number of children: Not on file   Years of education: Not on file   Highest education level: Not on file  Occupational History   Not on file  Tobacco Use   Smoking status: Former    Current packs/day: 0.00    Average packs/day: 2.0 packs/day for 14.0 years (28.0 ttl pk-yrs)    Types: Cigarettes    Start  date: 07/28/1955    Quit date: 07/27/1969    Years since quitting: 54.5   Smokeless tobacco: Never  Vaping Use   Vaping status: Never Used  Substance and Sexual Activity   Alcohol use: Yes    Comment: beer occ   Drug use: Never   Sexual activity: Yes  Other Topics Concern   Not on file  Social History Narrative   Not on file   Social Drivers of Health   Financial Resource Strain: Low Risk  (01/13/2024)   Overall Financial Resource Strain (CARDIA)    Difficulty of Paying Living Expenses: Not hard at all  Food Insecurity: No Food Insecurity (01/13/2024)   Hunger Vital Sign    Worried About Running Out of Food in the Last Year: Never true    Ran Out of Food in the Last Year: Never true  Transportation Needs: No Transportation Needs (01/13/2024)   PRAPARE - Administrator, Civil Service (Medical): No    Lack of Transportation (Non-Medical): No  Physical Activity: Sufficiently Active (01/13/2024)   Exercise Vital Sign    Days of Exercise per Week: 7 days    Minutes of Exercise per Session: 30 min  Stress: No Stress Concern Present (01/13/2024)   [employer] of Occupational  Health - Occupational Stress Questionnaire    Feeling of Stress: Not at all  Social Connections: Socially Integrated (01/13/2024)   Social Connection and Isolation Panel    Frequency of Communication with Friends and Family: More than three times a week    Frequency of Social Gatherings with Friends and Family: More than three times a week    Attends Religious Services: More than 4 times per year    Active Member of [employer] or Organizations: Yes    Attends Engineer, structural: More than 4 times per year    Marital Status: Married    Tobacco Counseling Counseling given: Yes    Clinical Intake:  Pre-visit preparation completed: Yes  Pain : No/denies pain Pain Score: 0-No pain     BMI - recorded: 26.76 Nutritional Status: BMI 25 -29 Overweight Nutritional Risks: None Diabetes: No  Lab Results  Component Value Date   HGBA1C 5.8 (H) 12/13/2023   HGBA1C 5.8 (H) 04/27/2019     How often do you need to have someone help you when you read instructions, pamphlets, or other written materials from your doctor or pharmacy?: 1 - Never  Interpreter Needed?: No  Information entered by :: Sally Crazier CMA   Activities of Daily Living     01/13/2024    4:44 PM  In your present state of health, do you have any difficulty performing the following activities:  Hearing? 0  Vision? 0  Difficulty concentrating or making decisions? 0  Walking or climbing stairs? 0  Dressing or bathing? 0  Doing errands, shopping? 0  Preparing Food and eating ? N  Using the Toilet? N  In the past six months, have you accidently leaked urine? N  Do you have problems with loss of bowel control? N  Managing your Medications? N  Managing your Finances? N  Housekeeping or managing your Housekeeping? N    Patient Care Team: Meldon Sport, MD as PCP - General (Internal Medicine) Augustin Leber, RN as [facility] Care Management [name], Nadene Aures, MD as Consulting Physician (Pulmonary  Disease) Maximo Spar Aviva Lemmings, MD as Consulting Physician (Cardiology) Janean Meals, FNP as Consulting Physician (Physical Medicine and Rehabilitation) Gwynne Less, Francena Infield, MD  as Referring Physician (Audiology) Diamond Formica, MD as Consulting Physician (Pulmonary Disease) Jennefer Moats, DPM as Consulting Physician (Podiatry) Aquilla Knapp, NP as Consulting Physician (Urology) Aleen Huron, MD as Consulting Physician (Dermatology) Ambs, Jeanmarie Millet, FNP as Nurse Practitioner (Allergy) Lauretta Ponto, FNP as Consulting Physician (Urology) Alvina Axon, MD as Consulting Physician (Ophthalmology) Alyce Jubilee, MD as Referring Physician (Ophthalmology)  I have updated your Care Teams any recent Medical Services you may have received from other providers in the past year.     Assessment:   This is a routine wellness examination for [place].  Hearing/Vision screen Hearing Screening - Comments:: C/o hearing loss but is currently undergoing testing Vision Screening - Comments:: Patient wears reading glasses only. Up to date with yearly exams.  Patient sees Dr. Terrall Ferraris at [facility] and Dr. Sanjuan Crumbly    Goals Addressed             This Visit's Progress    Patient Stated       Remain active and healthy       Depression Screen     01/13/2024    4:43 PM 12/13/2023    9:56 AM 07/27/2023    8:42 AM 06/21/2023    9:49 AM 02/16/2023    1:43 PM 01/13/2023    1:24 PM 01/13/2023    1:07 PM  PHQ 2/9 Scores  PHQ - 2 Score 0 0 0 0 0 0 0  PHQ- 9 Score 0 0         Fall Risk     01/13/2024    4:46 PM 12/13/2023    9:56 AM 07/27/2023    8:41 AM 06/21/2023    9:48 AM 06/07/2023    1:52 PM  Fall Risk   Falls in the past year? 0 0 0 0 0  Number falls in past yr: 0 0 0 0 0  Injury with Fall? 0 0 0 0 0  Risk for fall due to : No Fall Risks No Fall Risks No Fall Risks No Fall Risks   Follow up Falls evaluation completed;Education provided;Falls prevention discussed  Falls evaluation completed Falls evaluation completed Falls evaluation completed     MEDICARE RISK AT HOME:  Medicare Risk at Home Any stairs in or around the home?: Yes If so, are there any without handrails?: No Home free of loose throw rugs in walkways, pet beds, electrical cords, etc?: Yes Adequate lighting in your home to reduce risk of falls?: Yes Life alert?: No Use of a cane, walker or w/c?: No Grab bars in the bathroom?: Yes Shower chair or bench in shower?: No Elevated toilet seat or a handicapped toilet?: Yes  TIMED UP AND GO:  Was the test performed?  No  Cognitive Function: 6CIT completed    01/07/2022    1:32 PM  MMSE - Mini Mental State Exam  Not completed: Unable to complete        01/13/2024    4:46 PM 01/13/2023    1:24 PM 01/07/2022    1:32 PM  6CIT Screen  What Year? 0 points 0 points 0 points  What month? 0 points 0 points 0 points  What time? 0 points 0 points 0 points  Count back from 20 0 points 0 points 0 points  Months in reverse 0 points 0 points 0 points  Repeat phrase 0 points 2 points 0 points  Total Score 0 points 2 points 0 points    Immunizations Immunization History  Administered Date(s) Administered   Fluad Quad(high Dose 65+) 05/05/2019, 05/15/2022   Fluad Trivalent(High Dose 65+) 06/21/2023   Influenza Split 07/17/2011, 05/10/2012, 04/26/2014   Influenza Whole 05/17/2008, 06/11/2009   Influenza, High Dose Seasonal PF 05/08/2017, 05/05/2018, 05/21/2021   Influenza,inj,Quad PF,6+ Mos 08/14/2013, 04/29/2015   Influenza-Unspecified 04/27/2016, 05/15/2022   PFIZER(Purple Top)SARS-COV-2 Vaccination 10/21/2019, 11/14/2019   PNEUMOCOCCAL CONJUGATE-20 05/20/2023   Pneumococcal Conjugate-13 08/14/2013   Pneumococcal Polysaccharide-23 06/11/2009   Tdap 08/01/2015   Zoster Recombinant(Shingrix) 11/24/2021, 02/24/2022    Screening Tests Health Maintenance  Topic Date Due   COVID-19 Vaccine (3 - Pfizer risk series) 12/12/2019    INFLUENZA VACCINE  02/25/2024   Medicare Annual Wellness (AWV)  01/12/2025   DTaP/Tdap/Td (2 - Td or Tdap) 07/31/2025   Pneumococcal Vaccine: 50+ Years  Completed   Zoster Vaccines- Shingrix  Completed   HPV VACCINES  Aged Out   Meningococcal B Vaccine  Aged Out   Colonoscopy  Discontinued    Health Maintenance  Health Maintenance Due  Topic Date Due   COVID-19 Vaccine (3 - Pfizer risk series) 12/12/2019   Health Maintenance Items Addressed: Health Maintenance is up to date.   Additional Screening:  Vision Screening: Recommended annual ophthalmology exams for early detection of glaucoma and other disorders of the eye. Would you like a referral to an eye doctor? No    Dental Screening: Recommended annual dental exams for proper oral hygiene  Community Resource Referral / Chronic Care Management: CRR required this visit?  No   CCM required this visit?  No   Plan:    I have personally reviewed and noted the following in the patient's chart:   Medical and social history Use of alcohol, tobacco or illicit drugs  Current medications and supplements including opioid prescriptions. Patient is not currently taking opioid prescriptions. Functional ability and status Nutritional status Physical activity Advanced directives List of other physicians Hospitalizations, surgeries, and ER visits in previous 12 months Vitals Screenings to include cognitive, depression, and falls Referrals and appointments  In addition, I have reviewed and discussed with patient certain preventive protocols, quality metrics, and best practice recommendations. A written personalized care plan for preventive services as well as general preventive health recommendations were provided to patient.   Manases Etchison, CMA   01/13/2024   After Visit Summary: (MyChart) Due to this being a telephonic visit, the after visit summary with patients personalized plan was offered to patient via MyChart   Notes:  Nothing significant to report at this time.

## 2024-01-19 NOTE — Telephone Encounter (Signed)
Patient never responded to calls 

## 2024-01-19 NOTE — Telephone Encounter (Signed)
 Thank you for trying to reach out to him

## 2024-01-21 ENCOUNTER — Other Ambulatory Visit (HOSPITAL_COMMUNITY): Payer: Self-pay | Admitting: Physical Medicine and Rehabilitation

## 2024-01-21 DIAGNOSIS — H93A3 Pulsatile tinnitus, bilateral: Secondary | ICD-10-CM

## 2024-01-27 DIAGNOSIS — H35033 Hypertensive retinopathy, bilateral: Secondary | ICD-10-CM | POA: Diagnosis not present

## 2024-01-27 DIAGNOSIS — H353124 Nonexudative age-related macular degeneration, left eye, advanced atrophic with subfoveal involvement: Secondary | ICD-10-CM | POA: Diagnosis not present

## 2024-01-27 DIAGNOSIS — H43813 Vitreous degeneration, bilateral: Secondary | ICD-10-CM | POA: Diagnosis not present

## 2024-01-27 DIAGNOSIS — H43393 Other vitreous opacities, bilateral: Secondary | ICD-10-CM | POA: Diagnosis not present

## 2024-01-27 DIAGNOSIS — H353113 Nonexudative age-related macular degeneration, right eye, advanced atrophic without subfoveal involvement: Secondary | ICD-10-CM | POA: Diagnosis not present

## 2024-02-10 ENCOUNTER — Ambulatory Visit (HOSPITAL_COMMUNITY)
Admission: RE | Admit: 2024-02-10 | Discharge: 2024-02-10 | Disposition: A | Discharge status: Home / Self Care | Source: Ambulatory Visit | Attending: Vascular Surgery | Admitting: Vascular Surgery

## 2024-02-10 DIAGNOSIS — H93A3 Pulsatile tinnitus, bilateral: Secondary | ICD-10-CM | POA: Insufficient documentation

## 2024-02-12 NOTE — Progress Notes (Unsigned)
 Subjective:   Patient ID: Antonio Carlson, male    DOB: February 14, 1946   MRN: [id number]   Brief patient profile:  78 yowm quit smoking in 1971 with history of [facility] and hyperlipidemia and hbp  Brief patient profile:  06/23/2016 NP  Follow up : HTN, Hyperlipidemia/[facility]  Pt returns for 3 month follow up and med review .  We reviewed all his meds and organized them into a med calendar w/ pt education . He appears to be taking correctly except for flonase  , has not been using on regular basis.  Complains of 1 week of nasal congestion , drainage, stuffiness, sneezing, and ear fullness. No fever or sinus pain. Minimal cough .   rec Prednisone  taper as directed.  Follow med calendar closely and bring to each visit.  Restart Flonase        11/19/2022  f/u ov/[place] office/Ivaan Liddy re: [facility] maint on pulm/performist  > referred to ENT in [place] by PCP - supposed to have prednisone  prn as part of action plan but ran out and not refilled  Chief Complaint  Patient presents with   Follow-up    Pt f/u states that his sinuses feel clearer compared to previous OV. He still has some nasal congestion but it is mild  Dyspnea:   Not limited by breathing from desired activities   Cough: minimal / assoc pnd/ clear at  present  Sleeping: level bed / 2 pillows less than before  SABA use: never  02: none  Rec If breathing/ wheezing or nasal congestion worsen > prednisone  10 mg x 2 with breakfast until better then 1 daily x 5 days and stop   02/25/2023  f/u ov/[place] office/Toshia Larkin re: [facility] asthma  maint on berformist/bud/flonase  anticipating nasal polypectomy 2011 last one next one Mar 12 2023 in [place]  Chief Complaint  Patient presents with   Asthma   Dyspnea:  not limited / last pred x 3 weeks  Cough: pnds/nasal congestion > cloudy Sleeping: flat bed with one pillow s resp cc  SABA use: none  02: none  Rec No change in your maintenance nebulizers = performist / budesonide   or as needed prednisone   Be sure to tell your anesthesiologist you've been on prednisone  in the past Only use your albuterol  nebulizer as a rescue medication   03/12/23 BILATERAL MAXILLARY ANTROSTOMY WITH TISSUE REMOVAL BILATERAL ETHMOIDECTOMY AND SPHENOIDOTOMY WITH TISSUE REMOVAL BILATERAL FRONTAL SINUSOTOMY NAVIGATION   06/10/2023  f/u ov/[place] office/Ryleigh Esqueda re: tri[facility] asthma maint on performist/bud  and prn prednisone  (did not remember this was in his action plan though)  Chief Complaint  Patient presents with   Follow-up   Dyspnea:  only with exertion  Cough: clear mucus /cipro  now for uti/ bph Sleeping: flat in bed/  1-2 pillow s   resp cc  SABA use: rarely usually  Rec Plan A = Automatic = Always=    performist/bud 0.5 mg  first thing and 12 hours later just the perfomist  Rinse and gargle with water  when done.  If mouth or throat bother you at all,  try brushing teeth/gums/tongue with arm and hammer toothpaste/ make a slurry and gargle and spit out.  Plan B = Backup (to supplement plan A, not to replace it) Only use your albuterol  inhaler as a rescue medication  Plan C = Crisis (instead of Plan B but only if Plan B stops working) - only use your albuterol  nebulizer if you first try Plan B  Also  Ok  to try albuterol  15 min before an activity (on alternating days)  that you know would usually make you short of breath  Plan D = Deltasone  10 mg  If ABC not working use  take 2 daily until better then one daily x 5 days  For cough > mucinex  dm 1200 mg every 12 hours as needed  Diflucan  100 mg one daily x 7 days and follow up with ent if not better  Please schedule a follow up visit in 3 months but call sooner if needed     09/20/2023  f/u ov/[place] office/Britteny Fiebelkorn re: [facility] asthma  maint on performist bud   Chief Complaint  Patient presents with   Follow-up    asthma  Dyspnea:  worse since last ov has not tried plan D , some better if uses saba prior to ex / no ex cp  Cough:  white but thick  Sleeping: sleeping fine s resp cc  SABA use: sev times a day  02: none  Rec     02/14/2024  f/u ov/[place] office/Albion Weatherholtz re: *** maint on ***  No chief complaint on file.   Dyspnea:  *** Cough: *** Sleeping: ***   resp cc  SABA use: *** 02: ***  Lung cancer screening: ***   No obvious day to day or daytime variability or assoc excess/ purulent sputum or mucus plugs or hemoptysis or cp or chest tightness, subjective wheeze or overt sinus or hb symptoms.    Also denies any obvious fluctuation of symptoms with weather or environmental changes or other aggravating or alleviating factors except as outlined above   No unusual exposure hx or h/o childhood pna/ asthma or knowledge of premature birth.  Current Allergies, Complete Past Medical History, Past Surgical History, Family History, and Social History were reviewed in [employer] record.  ROS  The following are not active complaints unless bolded Hoarseness, sore throat, dysphagia, dental problems, itching, sneezing,  nasal congestion or discharge of excess mucus or purulent secretions, ear ache,   fever, chills, sweats, unintended wt loss or wt gain, classically pleuritic or exertional cp,  orthopnea pnd or arm/hand swelling  or leg swelling, presyncope, palpitations, abdominal pain, anorexia, nausea, vomiting, diarrhea  or change in bowel habits or change in bladder habits, change in stools or change in urine, dysuria, hematuria,  rash, arthralgias, visual complaints, headache, numbness, weakness or ataxia or problems with walking or coordination,  change in mood or  memory.        No outpatient medications have been marked as taking for the 02/14/24 encounter (Appointment) with Darlean Ozell NOVAK, MD.               [facility].  Syncope  - Holter ordered June 27, 2008 > nl  - EP consult June 27, 2008  OSTEOPENIA (ICD-733.90)  COLONIC POLYPS (ICD-211.3)  .......................................SABRA Princella Nida - see colonoscopy 11/07/10 (repeat in 10 yr)  DIVERTICULOSIS, MILD (ICD-562.10) ASTHMA (ICD-493.90)  - HFA 90% June 11, 2009  ALLERGIC RHINITIS, CHRONIC (ICD-477.9)  - steroid dep until mid oct 2009  - Allergy profile sent January 23, 2010 >> IgE 18.3  - Polypectomy Sept 2012 ......................................................  [name]  BENIGN PROSTATIC HYPERTROPHY, HX OF (ICD-V13.8) .SABRA... 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  [facility]  HEALTH MAINTENANCE.......................................................SABRA referred to [facility] 09/23/2021  R C6/7 radiculopathy 2015 .....................................................SABRAMalcolm  - Pneumovax 11/2004 and @ age 62 June 11, 2009 and Prevnar 13 08/14/2013  - Td 07/2005   And  08/01/2015  - 2nd Covid May 2021        Past Surgical History:  Appendectomy  Colonoscopy     Family History:  heart disease in his father onset at age 106 he was a smoker  Ca brain half brother  Half siblings  - one lung ca, one breast ca, one brain ca Mother dementia onset late 2s lived to be 69    Social History:  quit smoking 1971  rarely drink alcohol  Retired  [employer] 2018  - remarried sept 2020        Objective:   Physical Exam  Wts  02/14/2024    ***  09/20/2023   182  06/10/2023  186  02/25/2023     186  11/19/2022   184  10/08/2022   179  09/23/2021   175  12/30/2020     172 07/01/2020   177 01/01/2020      175  10/09/2019    170   03/16/2019   179  wt 198 May 17, 2008 >  > 192 07/17/11 >  02/05/2012  191> 05/10/2012  188> 08/10/2012 190> 11/14/2012 180>182 12/12/2012 > 03/14/2013 182 > 07/06/2013 185 > 08/14/2013  184 > 01/31/2014  176 >176 .03/22/2014 >  05/31/2014   181 > 06/11/14  179 > 08/20/2014   184> 02/18/2015  181 >  08/01/2015  178 > 09/16/2015 182  > 12/16/2015   185 > 03/17/2016  185> 08/07/2016   187  > 10/22/2016    183 > 01/22/2017   180> 07/29/2017   177 > 10/28/2017  169 >  06/16/2018  175 > 12/15/2018  182 >   Vital signs reviewed  02/14/2024  - Note at rest 02 sats  ***% on ***   General appearance:    ***

## 2024-02-14 ENCOUNTER — Encounter: Payer: Self-pay | Admitting: Internal Medicine

## 2024-02-14 ENCOUNTER — Ambulatory Visit (INDEPENDENT_AMBULATORY_CARE_PROVIDER_SITE_OTHER): Admitting: Internal Medicine

## 2024-02-14 VITALS — BP 134/75 | HR 72 | Ht 68.0 in | Wt 175.4 lb

## 2024-02-14 DIAGNOSIS — J339 Nasal polyp, unspecified: Secondary | ICD-10-CM | POA: Diagnosis not present

## 2024-02-14 DIAGNOSIS — J45909 Unspecified asthma, uncomplicated: Secondary | ICD-10-CM

## 2024-02-14 DIAGNOSIS — J45998 Other asthma: Secondary | ICD-10-CM | POA: Diagnosis not present

## 2024-02-14 DIAGNOSIS — Z87891 Personal history of nicotine dependence: Secondary | ICD-10-CM

## 2024-02-14 DIAGNOSIS — Z886 Allergy status to analgesic agent status: Secondary | ICD-10-CM | POA: Diagnosis not present

## 2024-02-14 MED ORDER — PREDNISONE 10 MG PO TABS: ORAL_TABLET | ORAL | 2 refills | Status: AC

## 2024-02-14 NOTE — Patient Instructions (Signed)
 No change in medications  Keep up with your ENT and allergy follow up appointments and recommendations    Please schedule a follow up visit in 6 months but call sooner if needed

## 2024-02-14 NOTE — Assessment & Plan Note (Signed)
 Quit smoking 1971  - PFT's  09/16/2015  FEV1 1.81 (61 % ) ratio 56  p 44 % improvement from saba with DLCO  81/81c % corrects to 107  % for alv volume  Done prior to any am meds  - 06/16/2018  After extensive coaching inhaler device,  effectiveness =    90%  - 10/2018 rx prednisone  as plan C x 6 days - Allergy profile 10/09/19   >  Eos 0.9 /  IgE  106 - 01/01/2020 submitted paperwork for dupixent > did not go thru > referred to Dr Maurilio 07/01/2020  - 12/30/2020 referred again to Dr Rowan service  - improved p starting dupixent  07/03/21  but only received 2 infusions the ? Adverse drug effects = arthralgias/ pnneumonia  - 11/19/2022 prn prednisone  restarted:  prednisone  10 mg x 2 with breakfast until better then 1 daily x 5 days and stop - 06/10/2023 resumed pred as above as plan D - 09/20/2023 referred back to [facility] office for allergy and again resumed pred as plan D - 02/14/2024  After extensive coaching inhaler device,  effectiveness =    90%  hfa   All goals of chronic asthma control met including optimal function and elimination of symptoms with minimal need for rescue therapy.  Contingencies discussed in full including contacting this office immediately if not controlling the symptoms using the rule of two's.     F/u per Allergy/ ENT and here  q 6 m, sooner prn         Each maintenance medication was reviewed in detail including emphasizing most importantly the difference between maintenance and prns and under what circumstances the prns are to be triggered using an action plan format where appropriate.  Total time for H and P, chart review, counseling, reviewing hfa  device(s) and generating customized AVS unique to this office visit / same day charting = 

## 2024-02-15 ENCOUNTER — Ambulatory Visit: Payer: Self-pay | Admitting: Internal Medicine

## 2024-02-15 LAB — CBC WITH DIFFERENTIAL/PLATELET
Basophils Absolute: 0.1 x10E3/uL (ref 0.0–0.2)
Basos: 1 %
EOS (ABSOLUTE): 0.9 x10E3/uL — ABNORMAL HIGH (ref 0.0–0.4)
Eos: 12 %
Hematocrit: 44.4 % (ref 37.5–51.0)
Hemoglobin: 14.2 g/dL (ref 13.0–17.7)
Immature Grans (Abs): 0 x10E3/uL (ref 0.0–0.1)
Immature Granulocytes: 0 %
Lymphocytes Absolute: 1.9 x10E3/uL (ref 0.7–3.1)
Lymphs: 26 %
MCH: 27 pg (ref 26.6–33.0)
MCHC: 32 g/dL (ref 31.5–35.7)
MCV: 84 fL (ref 79–97)
Monocytes Absolute: 1 x10E3/uL — ABNORMAL HIGH (ref 0.1–0.9)
Monocytes: 13 %
Neutrophils Absolute: 3.6 x10E3/uL (ref 1.4–7.0)
Neutrophils: 48 %
Platelets: 306 x10E3/uL (ref 150–450)
RBC: 5.26 x10E6/uL (ref 4.14–5.80)
RDW: 14.8 % (ref 11.6–15.4)
WBC: 7.5 x10E3/uL (ref 3.4–10.8)

## 2024-02-15 NOTE — Progress Notes (Signed)
Spoke with pt and notified of results per Dr. [name]. Pt verbalized understanding and denied any questions. 

## 2024-02-17 ENCOUNTER — Other Ambulatory Visit (HOSPITAL_COMMUNITY): Payer: Self-pay | Admitting: Physical Medicine and Rehabilitation

## 2024-02-17 DIAGNOSIS — H93A3 Pulsatile tinnitus, bilateral: Secondary | ICD-10-CM

## 2024-02-22 ENCOUNTER — Encounter: Payer: Self-pay | Admitting: Cardiology

## 2024-02-22 ENCOUNTER — Ambulatory Visit: Attending: Cardiology | Admitting: Cardiology

## 2024-02-22 VITALS — BP 134/68 | HR 76 | Ht 68.0 in | Wt 176.0 lb

## 2024-02-22 DIAGNOSIS — E782 Mixed hyperlipidemia: Secondary | ICD-10-CM | POA: Insufficient documentation

## 2024-02-22 DIAGNOSIS — I491 Atrial premature depolarization: Secondary | ICD-10-CM | POA: Diagnosis not present

## 2024-02-22 DIAGNOSIS — I25118 Atherosclerotic heart disease of native coronary artery with other forms of angina pectoris: Secondary | ICD-10-CM | POA: Diagnosis not present

## 2024-02-22 DIAGNOSIS — E785 Hyperlipidemia, unspecified: Secondary | ICD-10-CM | POA: Diagnosis not present

## 2024-02-22 DIAGNOSIS — R079 Chest pain, unspecified: Secondary | ICD-10-CM | POA: Insufficient documentation

## 2024-02-22 MED ORDER — METOPROLOL TARTRATE 50 MG PO TABS: 50.0000 mg | ORAL_TABLET | Freq: Two times a day (BID) | ORAL | 2 refills | Status: DC

## 2024-02-22 MED ORDER — NITROGLYCERIN 0.4 MG SL SUBL: 0.4000 mg | SUBLINGUAL_TABLET | SUBLINGUAL | 3 refills | Status: AC | PRN

## 2024-02-22 NOTE — Patient Instructions (Addendum)
 Medication Instructions:  INCREASE METOPROLOL  TARTRATE TO 50 MG TWICE A DAILY  START NITROGLYCERIN  0.4 MG SUBLINGUAL AS NEEDED FOR CHEST PAIN *If you need a refill on your cardiac medications before your next appointment, please call your pharmacy*  Lab Work: NO LABS If you have labs (blood work) drawn today and your tests are completely normal, you will receive your results only by: MyChart Message (if you have MyChart) OR A paper copy in the mail If you have any lab test that is abnormal or we need to change your treatment, we will call you to review the results.  Testing/Procedures:   Please report to Radiology at [facility] Main Entrance, medical mall, 30 mins prior to your test.  [address]  [place], [place]  How to Prepare for Your Cardiac PET/CT Stress Test:  Nothing to eat or drink, except water , 3 hours prior to arrival time.  NO caffeine/decaffeinated products, or chocolate 12 hours prior to arrival. (Please note decaffeinated beverages (teas/coffees) still contain caffeine).  If you have caffeine within 12 hours prior, the test will need to be rescheduled.  Medication instructions: Do not take erectile dysfunction medications for 72 hours prior to test (sildenafil, tadalafil) Do not take nitrates (isosorbide  mononitrate, Ranexa) the day before or day of test Do not take tamsulosin the day before or morning of test Hold theophylline containing medications for 12 hours. Hold Dipyridamole 48 hours prior to the test.  You may take your remaining medications with water .  NO perfume, cologne or lotion on chest or abdomen area.  Total time is 1 to 2 hours; you may want to bring reading material for the waiting time.  In preparation for your appointment, medication and supplies will be purchased.  Appointment availability is limited, so if you need to cancel or reschedule, please call the Radiology Department Scheduler at [phone] 24 hours  in advance to avoid a cancellation fee of $100.00  What to Expect When you Arrive:  Once you arrive and check in for your appointment, you will be taken to a preparation room within the Radiology Department.  A technologist or Nurse will obtain your medical history, verify that you are correctly prepped for the exam, and explain the procedure.  Afterwards, an IV will be started in your arm and electrodes will be placed on your skin for EKG monitoring during the stress portion of the exam. Then you will be escorted to the PET/CT scanner.  There, staff will get you positioned on the scanner and obtain a blood pressure and EKG.  During the exam, you will continue to be connected to the EKG and blood pressure machines.  A small, safe amount of a radioactive tracer will be injected in your IV to obtain a series of pictures of your heart along with an injection of a stress agent.    After your Exam:  It is recommended that you eat a meal and drink a caffeinated beverage to counter act any effects of the stress agent.  Drink plenty of fluids for the remainder of the day and urinate frequently for the first couple of hours after the exam.  Your doctor will inform you of your test results within 7-10 business days.  For more information and frequently asked questions, please visit our website: [email or web address]  For questions about your test or how to prepare for your test, please call: Cardiac Imaging Nurse Navigators Office: [phone]   Follow-Up: At [facility], you and your health  needs are our priority.  As part of our continuing mission to provide you with exceptional heart care, our providers are all part of one team.  This team includes your primary Cardiologist (physician) and Advanced Practice Providers or APPs (Physician Assistants and Nurse Practitioners) who all work together to provide you with the care you need, when you need it.  Your next appointment:   3  month(s)  Provider:   ANY APP   Other Instructions You have been referred to [facility] - LIPID CLINIC

## 2024-02-22 NOTE — Progress Notes (Unsigned)
 Cardiology Office Note:   Date:  02/23/2024  ID:  Antonio Carlson, DOB 08/25/1945, MRN [id number] PCP: Tobie Suzzane POUR, MD  [facility] Providers Cardiologist:  None    History of Present Illness:   Discussed the use of AI scribe software for clinical note transcription with the patient, who gave verbal consent to proceed.  History of Present Illness Antonio Carlson is an 78 year old male with a history of CAD, prior CABG who presents with recent chest discomfort and palpitations.  He experienced a 'fluttery pain' in his chest that radiated to his back shoulder blade two days ago, prompting a visit to the fire department. The discomfort persisted throughout the day and night but has since resolved. He describes the pain as mild, rating it a 2 out of 10, and notes it was not similar to any pain experienced prior to his bypass surgery five years ago. He did not use nitroglycerin  as he does not have a prescription for it. No current chest pain or recurrence of the fluttery sensation.  He experiences shortness of breath on exertion, which has been a persistent issue for a while. He uses a nebulizer twice daily for his asthma and has been followed by pulmonology. His breathing issues have worsened somewhat over the past year. 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  He experienced a prolonged episode of diarrhea after eating a flounder sandwich in [place] , which lasted for over a month but has since improved. No current gastrointestinal symptoms.  He has a history of sinus problems and underwent sinus surgery last year. He experiences daily sinus drainage, which he manages with a sinus rinse, and notes that it causes him to cough up clear, thick mucus.   Today patient denies chest pain, shortness of breath, lower extremity edema, fatigue, palpitations, melena, hematuria, hemoptysis, diaphoresis, weakness, presyncope,  syncope, orthopnea, and PND.   Studies Reviewed:    EKG:   EKG Interpretation Date/Time:  Tuesday February 22 2024 15:03:59 EDT Ventricular Rate:  77 PR Interval:  156 QRS Duration:  92 QT Interval:  398 QTC Calculation: 450 R Axis:   -42  Text Interpretation: Sinus rhythm with occasional Premature atrial complexes Left axis deviation Confirmed by Trudy Birmingham [phone]) on 02/23/2024 1:04:18 PM     Risk Assessment/Calculations:              Physical Exam:   VS:  BP 134/68 (BP Location: Left Arm, Patient Position: Sitting, Cuff Size: Normal)   Pulse 76   Ht 5' 8 (1.727 m)   Wt 176 lb (79.8 kg)   SpO2 95%   BMI 26.76 kg/m    Wt Readings from Last 3 Encounters:  02/22/24 176 lb (79.8 kg)  02/14/24 175 lb 6.4 oz (79.6 kg)  01/13/24 176 lb (79.8 kg)     Physical Exam Vitals reviewed.  Constitutional:      Appearance: Normal appearance.  HENT:     Head: Normocephalic.  Eyes:     Pupils: Pupils are equal, round, and reactive to light.  Cardiovascular:     Rate and Rhythm: Normal rate. Rhythm irregular.     Pulses: Normal pulses.     Heart sounds: Normal heart sounds.     Comments: PACs Pulmonary:     Effort: Pulmonary effort is normal.     Breath sounds: Normal breath sounds.  Abdominal:     General: Abdomen is  flat.     Palpations: Abdomen is soft.  Musculoskeletal:     Right lower leg: No edema.     Left lower leg: No edema.  Skin:    General: Skin is warm and dry.     Capillary Refill: Capillary refill takes less than 2 seconds.  Neurological:     General: No focal deficit present.     Mental Status: He is alert and oriented to person, place, and time.  Psychiatric:        Mood and Affect: Mood normal.        Behavior: Behavior normal.        Thought Content: Thought content normal.        Judgment: Judgment normal.     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 No recurrence since initial episode. Differential includes potential graft obstruction. - Order PET stress test to evaluate coronary vessels and grafts - Prescribe nitroglycerin  for emergency use in case of chest pain - Instruct to take nitroglycerin  if chest pain does not resolve within 10 minutes - Advise to contact the clinic if chest pain recurs or worsens  Premature atrial and ventricular contractions Reports sensation of abnormal heartbeats, likely PVCs and PACs. EKG confirmed presence of PACs today. Strip from fire station show isolated PVC. Symptoms not overly bothersome. - Increase metoprolol  to 50mg  BID. - Monitor for dizziness after metoprolol  dose increase and report if persistent  Hypertension Blood pressure readings at home generally in the 130s, occasionally reaching 140. Current medication includes metoprolol , which is being increased to manage both blood pressure and premature contractions. - Metoprolol  as above.  Hyperlipidemia Cholesterol levels slightly worse than last year, not at goal of 55mg /dL. Currently on maximum doses of atorvastatin  and Zetia . - Lipid clinic referral.       Informed Consent   Shared Decision Making/Informed Consent The risks [chest pain, shortness of breath, cardiac arrhythmias, dizziness, blood pressure fluctuations, myocardial infarction, stroke/transient ischemic attack, nausea, vomiting, allergic reaction, radiation exposure, metallic taste sensation and life-threatening complications (estimated to be 1 in 10,000)], benefits (risk stratification, diagnosing coronary artery disease, treatment guidance) and alternatives of a cardiac PET stress test were discussed in detail with [name] and he agrees to proceed.      Signed, Artist Pouch, PA-C

## 2024-02-23 ENCOUNTER — Telehealth (HOSPITAL_COMMUNITY): Payer: Self-pay | Admitting: Emergency Medicine

## 2024-02-23 DIAGNOSIS — J3489 Other specified disorders of nose and nasal sinuses: Secondary | ICD-10-CM | POA: Diagnosis not present

## 2024-02-23 DIAGNOSIS — J45909 Unspecified asthma, uncomplicated: Secondary | ICD-10-CM | POA: Diagnosis not present

## 2024-02-23 DIAGNOSIS — J339 Nasal polyp, unspecified: Secondary | ICD-10-CM | POA: Diagnosis not present

## 2024-02-23 NOTE — Telephone Encounter (Signed)
 Reaching out to patient to offer assistance regarding upcoming cardiac imaging study; pt verbalizes understanding of appt date/time, parking situation and where to check in, pre-test NPO status and medications ordered, and verified current allergies; name and call back number provided for further questions should they arise Rockwell Alexandria RN Navigator Cardiac Imaging [facility] [phone] office [phone] cell

## 2024-02-24 ENCOUNTER — Ambulatory Visit
Admission: RE | Admit: 2024-02-24 | Discharge: 2024-02-24 | Disposition: A | Discharge status: Home / Self Care | Source: Ambulatory Visit | Attending: Cardiology | Admitting: Cardiology

## 2024-02-24 ENCOUNTER — Encounter: Payer: Self-pay | Admitting: Pharmacist

## 2024-02-24 ENCOUNTER — Ambulatory Visit: Attending: Cardiology | Admitting: Pharmacist

## 2024-02-24 DIAGNOSIS — R079 Chest pain, unspecified: Secondary | ICD-10-CM | POA: Diagnosis not present

## 2024-02-24 DIAGNOSIS — E785 Hyperlipidemia, unspecified: Secondary | ICD-10-CM | POA: Insufficient documentation

## 2024-02-24 DIAGNOSIS — I2510 Atherosclerotic heart disease of native coronary artery without angina pectoris: Secondary | ICD-10-CM | POA: Insufficient documentation

## 2024-02-24 MED ORDER — REGADENOSON 0.4 MG/5ML IV SOLN
INTRAVENOUS | Status: AC
Start: 2024-02-24 — End: 2024-02-24
  Filled 2024-02-24: qty 5

## 2024-02-24 MED ORDER — REGADENOSON 0.4 MG/5ML IV SOLN
0.4000 mg | Freq: Once | INTRAVENOUS | Status: AC
  Administered 2024-02-24: 0.4 mg via INTRAVENOUS
  Filled 2024-02-24: qty 5

## 2024-02-24 MED ORDER — RUBIDIUM RB82 GENERATOR (RUBYFILL)
25.0000 | PACK | Freq: Once | INTRAVENOUS | Status: AC
  Administered 2024-02-24: 20.65 via INTRAVENOUS

## 2024-02-24 NOTE — Progress Notes (Signed)
 Patient ID: Antonio Carlson                 DOB: [birth year]                    MRN: [id number]     HPI: Antonio Carlson is a 78 y.o. male patient referred to lipid clinic by Artist Pouch PA-C. Patient of Dr Mona. PMH is significant for HTN, CAD, angina, HLD, and CABG x 4.  Patient presents today to discuss lipid management. LDL actually fairly well controlled, however remains above goal. Currently managed on atorvastatin  80mg  and ezetimibe  10mg  ocne daily.  Has had a long day. Had nuclear PET test this morning. Wife currently admitted at [facility]. He is heading over there after visit here.  Has had no reported adverse effects to atorvastatin  or ezetimibe .   Current Medications:  Atorvastatin  80mg  daily Zetia  10mg  daily  Intolerances:  N/A  Risk Factors:  CAD CABG x 4 Angina  LDL goal: <55  Labs: TC 124, Trigs 90, HDL 46, LDL 61 (12/13/23)  Past Medical History:  Diagnosis Date   Allergy    Anginal pain (HCC)    Arthritis    Asthma    Benign prostatic hypertrophy    Coronary artery disease    Diverticulosis    Dyspnea    GERD (gastroesophageal reflux disease)    History of kidney stones    Hyperlipidemia    Hypertension    Nephrolithiasis    Pleural effusion on right    Pneumonia    PVC's (premature ventricular contractions)    RECTAL BLEEDING 09/15/2010   Qualifier: Diagnosis of  By: Ever PA-c, Amy S    Shingles    twice   Syncope     Current Outpatient Medications on File Prior to Visit  Medication Sig Dispense Refill   acetaminophen  (TYLENOL ) 500 MG tablet Take 500-1,000 mg by mouth every 6 (six) hours as needed for moderate pain or headache.     albuterol  (PROVENTIL ) (2.5 MG/3ML) 0.083% nebulizer solution Take 3 mLs (2.5 mg total) by nebulization every 4 (four) hours as needed for wheezing or shortness of breath. 75 mL 12   albuterol  (VENTOLIN  HFA) 108 (90 Base) MCG/ACT inhaler Inhale 2 puffs into the lungs every 6 (six) hours as needed for wheezing  or shortness of breath. 8 g 0   alfuzosin (UROXATRAL) 10 MG 24 hr tablet Take 10 mg by mouth daily.     atorvastatin  (LIPITOR ) 80 MG tablet TAKE 1 TABLET BY MOUTH DAILY AT 6 PM. 90 tablet 2   budesonide  (PULMICORT ) 0.5 MG/2ML nebulizer solution Take 2 mLs (0.5 mg total) by nebulization daily. 60 mL 11   CALCIUM  PO Take 1,200 mg by mouth daily.     clopidogrel  (PLAVIX ) 75 MG tablet TAKE 1 TABLET BY MOUTH EVERY DAY 90 tablet 1   ezetimibe  (ZETIA ) 10 MG tablet Take 1 tablet (10 mg total) by mouth daily. 90 tablet 3   famotidine  (PEPCID ) 20 MG tablet One after supper 30 tablet 11   fluticasone  (FLONASE ) 50 MCG/ACT nasal spray Place 2 sprays into both nostrils daily. 16 g 6   formoterol  (PERFOROMIST ) 20 MCG/2ML nebulizer solution Take 2 mLs (20 mcg total) by nebulization 2 (two) times daily. Asthma J45.909 360 mL 11   furosemide  (LASIX ) 20 MG tablet Take 1 tablet (20 mg total) by mouth daily as needed. 90 tablet 3   metoprolol  tartrate (LOPRESSOR ) 50 MG tablet Take 1 tablet (50  mg total) by mouth 2 (two) times daily. 180 tablet 2   montelukast  (SINGULAIR ) 10 MG tablet TAKE 1 TABLET BY MOUTH EVERYDAY AT BEDTIME 90 tablet 4   Multiple Vitamins-Minerals (ONE-A-DAY 50 PLUS PO) Take by mouth.     Multiple Vitamins-Minerals (PRESERVISION AREDS 2 PO) Take by mouth.     nitroGLYCERIN  (NITROSTAT ) 0.4 MG SL tablet Place 1 tablet (0.4 mg total) under the tongue every 5 (five) minutes as needed for chest pain. 25 tablet 3   pantoprazole  (PROTONIX ) 40 MG tablet TAKE 1 TABLET BY MOUTH EVERY DAY 90 tablet 3   predniSONE  (DELTASONE ) 10 MG tablet 2 daily until breathing and nasal congestion better then 1 daily x 5 days and stop 100 tablet 2   telmisartan  (MICARDIS ) 80 MG tablet TAKE 1 TABLET BY MOUTH EVERY DAY 90 tablet 3   No current facility-administered medications on file prior to visit.    Allergies  Allergen Reactions   Aspirin Other (See Comments)    Pt states I can't breath   Clavulanic Acid      Other Reaction(s): Unknown   Sulfonamide Derivatives Rash    Other Reaction(s): Other  Doesn't remember   Amoxicillin     GI upset Did it involve swelling of the face/tongue/throat, SOB, or low BP? No Did it involve sudden or severe rash/hives, skin peeling, or any reaction on the inside of your mouth or nose? No Did you need to seek medical attention at a hospital or doctor's office? No When did it last happen?    20+ years   If all above answers are NO, may proceed with cephalosporin use.     Assessment/Plan:  1. Hyperlipidemia - Patient's last LDL of 61 is slightly above goal of <55. Patient's insurance coverage appears to prefer Leqvio over PCSK9i, Will have patient sign and fax to service center. Patient prefers to have nurse inject over self injections.  Discussed mechanism of action, dosing schedule, and possible adverse effects, Will contact patient when med is approved. Recheck lipid panel after 2nd injection.  Continue atorvastatin  80mg  daily Continue Zetia  10mg  daily Start Leqvio 284mg  sq  Chris Kingdavid Leinbach, PharmD, BCACP, CDCES, CPP [facility] [address], [place], [place] [address] Phone: [phone]; Fax: [phone] 02/24/2024 2:56 PM

## 2024-02-24 NOTE — Patient Instructions (Signed)
 It was nice meeting you today  We would like your LDL (bad cholesterol) to be less than 55  Please continue your atorvastatin  80mg  and ezetimibe  10mg  once a day for now  The medication we spoke about is called Leqvio.  It is administered once, 3 months later and then every 6 months  I will call you when I receive word from the company with your copay  Chris Even Budlong, PharmD, BCACP, CDCES, CPP [facility] [address], [place], [place] [address] Phone: [phone]; Fax: [phone] 02/24/2024 2:31 PM

## 2024-02-26 LAB — NM PET CT CARDIAC PERFUSION MULTI W/ABSOLUTE BLOODFLOW
LV dias vol: 84 mL (ref 62–150)
LV sys vol: 28 mL (ref 4.2–5.8)
MBFR: 2.11
Nuc Rest EF: 63 %
Nuc Stress EF: 67 %
Peak HR: 101 {beats}/min
Rest HR: 76 {beats}/min
Rest MBF: 1.11 ml/g/min
Rest Nuclear Isotope Dose: 20.7 mCi
SRS: 0
SSS: 0
ST Depression (mm): 0 mm
Stress MBF: 2.34 ml/g/min
Stress Nuclear Isotope Dose: 20.7 mCi
TID: 1.02

## 2024-02-28 ENCOUNTER — Ambulatory Visit: Payer: Self-pay | Admitting: Cardiology

## 2024-03-03 NOTE — Progress Notes (Signed)
 Pt has been made aware of normal result and verbalized understanding.  jw

## 2024-03-09 ENCOUNTER — Other Ambulatory Visit: Payer: Self-pay | Admitting: Internal Medicine

## 2024-03-12 ENCOUNTER — Other Ambulatory Visit: Payer: Self-pay | Admitting: Internal Medicine

## 2024-03-12 DIAGNOSIS — E782 Mixed hyperlipidemia: Secondary | ICD-10-CM

## 2024-03-20 ENCOUNTER — Other Ambulatory Visit: Payer: Self-pay

## 2024-03-20 ENCOUNTER — Ambulatory Visit (INDEPENDENT_AMBULATORY_CARE_PROVIDER_SITE_OTHER): Admitting: Internal Medicine

## 2024-03-20 VITALS — BP 120/70 | HR 70 | Temp 97.7°F | Resp 16 | Ht 64.96 in | Wt 171.4 lb

## 2024-03-20 DIAGNOSIS — J339 Nasal polyp, unspecified: Secondary | ICD-10-CM | POA: Diagnosis not present

## 2024-03-20 DIAGNOSIS — J329 Chronic sinusitis, unspecified: Secondary | ICD-10-CM

## 2024-03-20 DIAGNOSIS — K219 Gastro-esophageal reflux disease without esophagitis: Secondary | ICD-10-CM

## 2024-03-20 DIAGNOSIS — J4489 Other specified chronic obstructive pulmonary disease: Secondary | ICD-10-CM | POA: Diagnosis not present

## 2024-03-20 MED ORDER — AZELASTINE HCL 0.1 % NA SOLN
2.0000 | Freq: Two times a day (BID) | NASAL | 5 refills | Status: AC | PRN
Start: 2024-03-20 — End: ?

## 2024-03-20 MED ORDER — FLUTICASONE PROPIONATE 50 MCG/ACT NA SUSP: 2.0000 | Freq: Every day | NASAL | 6 refills | Status: AC

## 2024-03-20 MED ORDER — MONTELUKAST SODIUM 10 MG PO TABS: 10.0000 mg | ORAL_TABLET | Freq: Every day | ORAL | 1 refills | Status: AC

## 2024-03-20 NOTE — Patient Instructions (Addendum)
 Chronic Rhinosinusitis  Hx of Nasal Polyps s/p surgery - Positive skin test 2022: none  - Use nasal saline rinses before nose sprays such as with Neilmed Sinus Rinse.  Use distilled water .   - Use Flonase  2 sprays each nostril daily. Aim upward and outward. - Use Azelastine  2 sprays each nostril twice daily as needed for runny nose, drainage, sneezing, congestion. Aim upward and outward. - Use Singulair  10mg  daily.  Stop if there are any mood/behavioral changes. - Please respond to Tammy's call regarding Xolair shots and get labwork done.   Asthma-COPD Overlap   Daily controller medication(s): increase Pulmicort  0.5mg  to twice daily and continue Perforomist  20mcg nebulizer twice a day. May use albuterol  rescue inhaler 2 puffs every 4 to 6 hours as needed for shortness of breath, chest tightness, coughing, and wheezing. May use albuterol  rescue inhaler 2 puffs 5 to 15 minutes prior to strenuous physical activities. Monitor frequency of use.  Asthma control goals:  Full participation in all desired activities (may need albuterol  before activity) Albuterol  use two times or less a week on average (not counting use with activity) Cough interfering with sleep two times or less a month Oral steroids no more than once a year No hospitalizations   Drug Allergy  Continue to avoid NSADs - no Advil, ibuprofen, aleve, aspirin. May take tylenol  for pain.  GERD - Continue protonix  40mg  daily. Take on empty stomach.  -Avoid lying down for at least two hours after a meal or after drinking acidic beverages, like soda, or other caffeinated beverages. This can help to prevent stomach contents from flowing back into the esophagus. -Keep your head elevated while you sleep. Using an extra pillow or two can also help to prevent reflux. -Eat smaller and more frequent meals each day instead of a few large meals. This promotes digestion and can aid in preventing heartburn. -Wear loose-fitting clothes to ease  pressure on the stomach, which can worsen heartburn and reflux. -Reduce excess weight around the midsection. This can ease pressure on the stomach. Such pressure can force some stomach contents back up the esophagus.

## 2024-03-20 NOTE — Progress Notes (Signed)
 FOLLOW UP Date of Service/Encounter:  03/20/24   Subjective:  Antonio Carlson (DOB: [birth year]) is a 78 y.o. male who [facility] for follow up for asthma-COPD, chronic rhinosinusitis with nasal polyps, GERD, drug allergy  History obtained from: chart review and patient. Last seen by Arlean Mutter 11/26/2023 for Asthma-COPD: Pulmicort  and Perforomist  nebs BID, PRN Albuterol . Followed by Dr Darlean. Nasal polyps- tried Dupixent  in past but had joint pain/SOB with activity; discussed Xolair but he never returned Tammy's calls. Underwent surgery with Dr Lenon 03/12/2023.  GERD- on PPI Drug allergies- avoids NSAIDs   Reports getting easily short of breath with walking short distances.  Followed by Dr Darlean on Pulmicort  nebs daily and Perforomist  BID. Also with CAD hx of CABG, followed by Cardiology. Most recent cardic PET stress test was unremarkable.   Does note lots of post nasal drainage and congestion.  Taking Flonase  PRN and was asking about Xolair shots due to recurrent nasal polyps, has had 3 surgeries.  Failed Dupixent  due to side effects.  Tammy had tried calling him but was unable to reach him.  He reports not getting these calls or voicemail.  Reflux is doing well on PPI daily, denies trouble with frequent heartburn/sour taste in mouth.     Past Medical History: Past Medical History:  Diagnosis Date   Allergy    Anginal pain (HCC)    Arthritis    Asthma    Benign prostatic hypertrophy    Coronary artery disease    Diverticulosis    Dyspnea    GERD (gastroesophageal reflux disease)    History of kidney stones    Hyperlipidemia    Hypertension    Nephrolithiasis    Pleural effusion on right    Pneumonia    PVC's (premature ventricular contractions)    RECTAL BLEEDING 09/15/2010   Qualifier: Diagnosis of  By: Ever PA-c, Amy S    Shingles    twice   Syncope     Objective:  BP 120/70 (BP Location: Left Arm, Patient Position:  Sitting, Cuff Size: Normal)   Pulse 70   Temp 97.7 F (36.5 C) (Temporal)   Resp 16   Ht 5' 4.96 (1.65 m)   Wt 171 lb 6.4 oz (77.7 kg)   SpO2 95%   BMI 28.56 kg/m  Body mass index is 28.56 kg/m. Physical Exam: GEN: alert, well developed HEENT: clear conjunctiva, nose with mild inferior turbinate hypertrophy, pink nasal mucosa, no rhinorrhea, + cobblestoning HEART: regular rate and rhythm, no murmur LUNGS: clear to auscultation bilaterally, no coughing, unlabored respiration SKIN: no rashes or lesions  Spirometry:  Tracings reviewed. His effort: It was hard to get consistent efforts and there is a question as to whether this reflects a maximal maneuver. FVC: 2.69L, 83% predicted  FEV1: 1.03L, 42% predicted FEV1/FVC ratio: 38% Interpretation: Spirometry consistent with severe obstructive disease.  Please see scanned spirometry results for details.  Assessment:   1. Chronic rhinosinusitis   2. Nasal polyposis   3. Asthma-COPD overlap syndrome (HCC)   4. Gastroesophageal reflux disease, unspecified whether esophagitis present     Plan/Recommendations:  Chronic Rhinosinusitis  Hx of Nasal Polyps s/p surgery - Uncontrolled, previously discussed Xolair but he never picked up the phone. Reports he will be on lookout for call from Tammy.  Start Xolair, will obtain IgE level.  We will try once more for Xolair approval but if he does not respond, we will not try again. -  Positive skin test 2022: none  - Use nasal saline rinses before nose sprays such as with Neilmed Sinus Rinse.  Use distilled water .   - Use Flonase  2 sprays each nostril daily. Aim upward and outward. - Use Azelastine  2 sprays each nostril twice daily as needed for runny nose, drainage, sneezing, congestion. Aim upward and outward. - Use Singulair  10mg  daily.  Stop if there are any mood/behavioral changes. - Failed Dupixent  due to side effects. Underwent 3 sinus surgeries, most recent 02/2023.    Asthma-COPD  Overlap  Uncontrolled Continue follow up with Pulm. Spirometry today with severe obstruction. Increase ICS dose.  Daily controller medication(s): increase Pulmicort  0.5mg  to twice daily and continue Perforomist  20mcg nebulizer twice a day. May use albuterol  rescue inhaler 2 puffs every 4 to 6 hours as needed for shortness of breath, chest tightness, coughing, and wheezing. May use albuterol  rescue inhaler 2 puffs 5 to 15 minutes prior to strenuous physical activities. Monitor frequency of use.  Asthma control goals:  Full participation in all desired activities (may need albuterol  before activity) Albuterol  use two times or less a week on average (not counting use with activity) Cough interfering with sleep two times or less a month Oral steroids no more than once a year No hospitalizations   Drug Allergy  Continue to avoid NSADs - no Advil, ibuprofen, aleve, aspirin. May take tylenol  for pain.  GERD - Controlled  - Continue protonix  40mg  daily. Take on empty stomach.  -Avoid lying down for at least two hours after a meal or after drinking acidic beverages, like soda, or other caffeinated beverages. This can help to prevent stomach contents from flowing back into the esophagus. -Keep your head elevated while you sleep. Using an extra pillow or two can also help to prevent reflux. -Eat smaller and more frequent meals each day instead of a few large meals. This promotes digestion and can aid in preventing heartburn. -Wear loose-fitting clothes to ease pressure on the stomach, which can worsen heartburn and reflux. -Reduce excess weight around the midsection. This can ease pressure on the stomach. Such pressure can force some stomach contents back up the esophagus.      Return in about 6 months (around 09/20/2024).  Arleta Blanch, MD [facility] 

## 2024-03-20 NOTE — Addendum Note (Signed)
 Addended by: JENEL MARYLYNN GRADE on: 03/20/2024 04:41 PM   Modules accepted: Orders

## 2024-03-21 ENCOUNTER — Other Ambulatory Visit: Payer: Self-pay

## 2024-03-21 DIAGNOSIS — J339 Nasal polyp, unspecified: Secondary | ICD-10-CM

## 2024-03-21 MED ORDER — FORMOTEROL FUMARATE 20 MCG/2ML IN NEBU: 2.0000 mL | INHALATION_SOLUTION | Freq: Two times a day (BID) | RESPIRATORY_TRACT | 11 refills | Status: DC

## 2024-03-24 DIAGNOSIS — R8271 Bacteriuria: Secondary | ICD-10-CM | POA: Diagnosis not present

## 2024-03-24 DIAGNOSIS — N401 Enlarged prostate with lower urinary tract symptoms: Secondary | ICD-10-CM | POA: Diagnosis not present

## 2024-03-24 DIAGNOSIS — R351 Nocturia: Secondary | ICD-10-CM | POA: Diagnosis not present

## 2024-03-24 DIAGNOSIS — R300 Dysuria: Secondary | ICD-10-CM | POA: Diagnosis not present

## 2024-03-29 ENCOUNTER — Ambulatory Visit (INDEPENDENT_AMBULATORY_CARE_PROVIDER_SITE_OTHER): Admitting: Podiatry

## 2024-03-29 DIAGNOSIS — Z91199 Patient's noncompliance with other medical treatment and regimen due to unspecified reason: Secondary | ICD-10-CM

## 2024-03-29 NOTE — Progress Notes (Signed)
 No show

## 2024-04-07 ENCOUNTER — Other Ambulatory Visit: Payer: Self-pay | Admitting: Internal Medicine

## 2024-04-07 NOTE — Telephone Encounter (Signed)
 Copied from CRM #[id number]. Topic: Clinical - Medication Refill >> Apr 07, 2024  3:42 PM Abigail D wrote: Medication: budesonide  (PULMICORT ) 0.5 MG/2ML nebulizer solution [[id number]] Patient stated to be sure that the diagnostic code is on there so that he has no trouble with insurance coverage.   Has the patient contacted their pharmacy? No, pharmacy stated he has 0 refills (Agent: If no, request that the patient contact the pharmacy for the refill. If patient does not wish to contact the pharmacy document the reason why and proceed with request.) (Agent: If yes, when and what did the pharmacy advise?)  This is the patient's preferred pharmacy:  [id number] [place], [place] - [address] [place] [address] Phone: [phone] Fax: [phone]  Is this the correct pharmacy for this prescription? Yes If no, delete pharmacy and type the correct one.   Has the prescription been filled recently? No  Is the patient out of the medication? No, 3 weeks left   Has the patient been seen for an appointment in the last year OR does the patient have an upcoming appointment? Yes  Can we respond through MyChart? No  Agent: Please be advised that Rx refills may take up to 3 business days. We ask that you follow-up with your pharmacy.

## 2024-04-07 NOTE — Telephone Encounter (Signed)
 Per patient, please include diagnostic code, so that there are no issues with insurance.

## 2024-04-10 ENCOUNTER — Other Ambulatory Visit: Payer: Self-pay | Admitting: Internal Medicine

## 2024-04-10 MED ORDER — METOPROLOL TARTRATE 50 MG PO TABS: 50.0000 mg | ORAL_TABLET | Freq: Two times a day (BID) | ORAL | 2 refills | Status: AC

## 2024-04-10 NOTE — Telephone Encounter (Signed)
 Copied from CRM [id number]. Topic: Clinical - Medication Refill >> Apr 10, 2024 11:06 AM Gustabo D wrote: Medication: metoprolol  tartrate (LOPRESSOR ) 50 MG tablet  Has the patient contacted their pharmacy? No (Agent: If no, request that the patient contact the pharmacy for the refill. If patient does not wish to contact the pharmacy document the reason why and proceed with request.) (Agent: If yes, when and what did the pharmacy advise?)  This is the patient's preferred pharmacy:  [id number] [place], [place] - [address] [place] [address] Phone: [phone] Fax: [phone]  Is this the correct pharmacy for this prescription? Yes If no, delete pharmacy and type the correct one.   Has the prescription been filled recently? No  Is the patient out of the medication? Yes will  be out in 2 to 3 weeks and needs a new prescription   Has the patient been seen for an appointment in the last year OR does the patient have an upcoming appointment? Yes  Can we respond through MyChart? No  Agent: Please be advised that Rx refills may take up to 3 business days. We ask that you follow-up with your pharmacy. >> Apr 10, 2024 11:07 AM Gustabo D wrote: Pt wants to know if he needs to continue taking 1 pill 2 times a day. Before calling in the refill.

## 2024-04-11 MED ORDER — BUDESONIDE 0.5 MG/2ML IN SUSP: 0.5000 mg | Freq: Every day | RESPIRATORY_TRACT | 11 refills | Status: AC

## 2024-04-12 ENCOUNTER — Encounter: Payer: Self-pay | Admitting: Podiatry

## 2024-04-12 ENCOUNTER — Other Ambulatory Visit (HOSPITAL_COMMUNITY): Payer: Self-pay

## 2024-04-12 ENCOUNTER — Ambulatory Visit (INDEPENDENT_AMBULATORY_CARE_PROVIDER_SITE_OTHER): Admitting: Podiatry

## 2024-04-12 ENCOUNTER — Telehealth: Payer: Self-pay

## 2024-04-12 DIAGNOSIS — M79675 Pain in left toe(s): Secondary | ICD-10-CM

## 2024-04-12 DIAGNOSIS — M79674 Pain in right toe(s): Secondary | ICD-10-CM

## 2024-04-12 DIAGNOSIS — B351 Tinea unguium: Secondary | ICD-10-CM | POA: Diagnosis not present

## 2024-04-12 NOTE — Progress Notes (Signed)
  Subjective:  Patient ID: Antonio Carlson, male    DOB: February 03, 1946,  MRN: [id number]  Chief Complaint  Patient presents with   Nail Problem    I hope she trims my toenails.    78 y.o. male presents concern of thickened elongated and painful nails that are difficult to trim. Requesting to have them trimmed today. Patient is on plavix  and at risk for foot care.    Objective:  Physical Exam: warm, good capillary refill, no trophic changes or ulcerative lesions, normal DP and PT pulses, and normal sensory exam. Left Foot: dystrophic yellowed discolored nail plates with subungual debris Right Foot: dystrophic yellowed discolored nail plates with subungual debris  Assessment:   1. Pain due to onychomycosis of toenails of both feet        Plan:  Patient was evaluated and treated and all questions answered. -Mechanically debrided all nails 1-5 bilateral using sterile nail nipper and filed with dremel without incident  -Answered all patient questions -Patient to return  in 3 months for at risk foot care -Patient advised to call the office if any problems or questions arise in the meantime.     Return in about 3 months (around 07/12/2024) for rfc.

## 2024-04-12 NOTE — Telephone Encounter (Signed)
*  Pulm  Pharmacy Patient Advocate Encounter   Received notification from CoverMyMeds that prior authorization for Budesonide  0.5MG /2ML suspension  is required/requested.   Insurance verification completed.   The patient is insured through [facility] .   Per test claim: Medication is not eligible for pharmacy benefits and must be billed through medical insurance. As our team only handles pharmacy related prior auths, medical PA's must be submitted by the clinic. Thank you

## 2024-04-21 ENCOUNTER — Telehealth: Payer: Self-pay | Admitting: *Deleted

## 2024-04-21 NOTE — Telephone Encounter (Signed)
-----   Message from Arleta SHAUNNA Blanch sent at 03/20/2024 11:19 AM EDT ----- Walterine Milling Can we please try once more to get Xolair approved for CRS with nasal polyps? He said he will be on the lookout for your call and if not, will call back if you leave a voicemail.  Told him this is the last time we are going to try to reach him.

## 2024-04-21 NOTE — Telephone Encounter (Signed)
 Called patient and advised due to Ins buy and bill for scheduled to start 10/6

## 2024-05-01 ENCOUNTER — Ambulatory Visit (INDEPENDENT_AMBULATORY_CARE_PROVIDER_SITE_OTHER)

## 2024-05-01 DIAGNOSIS — J339 Nasal polyp, unspecified: Secondary | ICD-10-CM | POA: Diagnosis not present

## 2024-05-01 MED ORDER — EPINEPHRINE 0.3 MG/0.3ML IJ SOAJ: 0.3000 mg | INTRAMUSCULAR | 1 refills | Status: AC | PRN

## 2024-05-01 MED ORDER — OMALIZUMAB 300 MG/2  ML ~~LOC~~ SOSY
300.0000 mg | PREFILLED_SYRINGE | SUBCUTANEOUS | Status: AC
  Administered 2024-05-01 – 2024-08-14 (×8): 300 mg via SUBCUTANEOUS

## 2024-05-01 NOTE — Progress Notes (Signed)
 Immunotherapy   Patient Details  Name: NAJEE MANNINEN MRN: [id number] Date of Birth: [birth year]  05/01/2024  Darcie VEAR Levers started injections for  Xolair buy and bill Following schedule: Every fourteen days.  Frequency:Every two weeks. Epi-Pen:Prescription for Epi-Pen given Consent signed in office today and patient instructions given. Patient waited in the lobby for sixty minutes without an issue.    Santana DELENA Eck 05/01/2024, 9:29 AM

## 2024-05-08 ENCOUNTER — Telehealth: Payer: Self-pay

## 2024-05-08 ENCOUNTER — Other Ambulatory Visit: Payer: Self-pay | Admitting: Internal Medicine

## 2024-05-08 NOTE — Telephone Encounter (Signed)
 Copied from CRM [id number]. Topic: Clinical - Medication Question >> May 08, 2024  9:56 AM Hadassah PARAS wrote: Reason for CRM: Pt called in regarding famotidine  and metoprolol . He seems a bit confused on which medication is requiring a medication refill. Famotidine  shows that medication has expired and will be contacting Dr. Darlean for prescription. On the metoprolol , it shows he received it on 04/10/24 and should have 180 tablets. He will be searching for that medication and getting in contact with pharmacy. He would like a nurse to call him back on the reason he is taking metoprolol  and the changes in dosage.    ----------------------------------------------------------------------- From previous Reason for Contact - Medication Refill: Medication:  metoprolol  tartrate (LOPRESSOR ) 50 MG tablet   Has the patient contacted their pharmacy? No (Agent: If no, request that the patient contact the pharmacy for the refill. If patient does not wish to contact the pharmacy document the reason why and proceed with request.) (Agent: If yes, when and what did the pharmacy advise?)  This is the patient's preferred pharmacy:  [id number] [place], [place] - [address] [place] [address] Phone: [phone] Fax: [phone]  [id number] [place], [place] - [address] AT [facility] [address] [facility] [address] [place] [address] Phone: [phone] Fax: [phone]  [facility] - [place], [place] - [address] [place] [address] Phone: [phone] Fax: [phone]  [facility] [place], [place] - [address] [place] [address] Phone: [phone] Fax: [phone]  [id number] [place], [place] - [address] [facility] [address]  [place] [address] Phone: [phone] Fax:  [phone]  [id number] - [place], [place] - [address] AT [facility] [address] [place] [address] Phone: [phone] Fax: [phone]  Is this the correct pharmacy for this prescription?   If no, delete pharmacy and type the correct one.   Has the prescription been filled recently?    Is the patient out of the medication?    Has the patient been seen for an appointment in the last year OR does the patient have an upcoming appointment?    Can we respond through MyChart?    Agent: Please be advised that Rx refills may take up to 3 business days. We ask that you follow-up with your pharmacy.

## 2024-05-08 NOTE — Telephone Encounter (Signed)
 Left detailed vm

## 2024-05-17 ENCOUNTER — Ambulatory Visit (INDEPENDENT_AMBULATORY_CARE_PROVIDER_SITE_OTHER)

## 2024-05-17 DIAGNOSIS — J339 Nasal polyp, unspecified: Secondary | ICD-10-CM | POA: Diagnosis not present

## 2024-05-22 NOTE — Progress Notes (Unsigned)
 Cardiology Office Note   Date:  05/24/2024  ID:  DONNAVAN COVAULT, DOB [birth year], MRN [id number] PCP: Tobie Suzzane POUR, MD  [facility] Providers Cardiologist:  Vinie JAYSON Maxcy, MD  History of Present Illness Antonio Carlson is a 78 y.o. male with a past medical history of CAD, prior CABG  in 2020 who presented 02/22/2024 with chest discomfort and palpitations.  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  The discomfort persisted throughout the day and night but has since resolved.  Describes the pain as mild rating it at 2 out of 10 and noted it was not similar to any pain experienced prior to bypass surgery 5 years prior.  He does not use nitroglycerin  as he does not have a prescription for it.  No current chest pain or recurrence of the fluttery sensation.  He experienced shortness of breath on exertion which has been a persistent issue for a while.  Uses a nebulizer twice a day for asthma and has been followed by pulmonary.  Breathing issues have worsened somewhat over the past year.  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  He experienced a prolonged episode of diarrhea after eating a flounder sandwich in [place]  which lasted for over a month but has since improved.  No current GI symptoms at the time of visit.  Has a history of sinus problems underwent sinus surgery last year.  Experiences daily sinus drainage which she manages with a sinus rinse and notes that it causes him to cough up clear thick mucus.  At the time of his last office visit he denied chest pain, shortness of breath, lower extremity edema, fatigue, palpitations, melena, hematuria, hemoptysis, diaphoresis, weakness, syncope/presyncope, orthopnea, and PND.  Today, he presents with coronary artery disease for cardiovascular follow-up and  right leg pain.  He experiences deep aching pain in his right leg, starting at the ankle and radiating upwards, affecting the entire leg. The pain is more pronounced at night, disturbing his sleep, and has been present for the past few weeks. It causes discomfort while walking. No recent injury to the leg is noted.  He has undergone four bypass surgeries five years ago. He mentions a past episode of syncope. He is on Plavix  and experiences easy bruising, particularly on his arms and hands, but no significant bleeding issues.  Reports no shortness of breath nor dyspnea on exertion. Reports no chest pain, pressure, or tightness. No edema, orthopnea, PND. Reports no palpitations.    ROS: pertinent ROS in HPI  Studies Reviewed     NM PET/CT cardiac perfusion 02/24/2024    The study is normal. The study is low risk.   LV perfusion is normal. There is no evidence of ischemia. There is no evidence of infarction.   Rest left ventricular function is normal. Rest EF: 63%. Stress left ventricular function is normal. Stress EF: 67%. End diastolic cavity size is normal. End systolic cavity size is normal. No evidence of transient ischemic dilation (TID) noted.   Myocardial blood flow was computed to be 1.41ml/g/min at rest and 2.74ml/g/min at stress. Global myocardial blood flow reserve was 2.11 and was normal.   Coronary calcium  assessment not performed due to prior revascularization.   Electronically signed by: Soyla DELENA Merck, MD   CLINICAL DATA:  This over-read does not include interpretation of cardiac or coronary anatomy or pathology. No interpretation the PET data set. The cardiac PET-CT interpretation by the cardiologist is attached.   COMPARISON:  None Available.   FINDINGS: Limited view of the lung parenchyma demonstrates no suspicious nodularity. Airways are normal.   Limited view of the mediastinum demonstrates no adenopathy. Esophagus normal.   Limited view of the upper abdomen  unremarkable.   Limited view of the skeleton and chest wall is unremarkable.   IMPRESSION: No significant extracardiac findings.     Electronically Signed   By: Jackquline Boxer M.D.   On: 02/25/2024 08:29      Physical Exam VS:  BP (!) 147/76 (BP Location: Left Arm, Patient Position: Sitting, Cuff Size: Large)   Pulse 65   Resp 16   Ht 5' 4 (1.626 m)   SpO2 95%   BMI 29.42 kg/m        Wt Readings from Last 3 Encounters:  03/20/24 171 lb 6.4 oz (77.7 kg)  02/22/24 176 lb (79.8 kg)  02/14/24 175 lb 6.4 oz (79.6 kg)    GEN: Well nourished, well developed in no acute distress NECK: No JVD; No carotid bruits CARDIAC: RRR, no murmurs, rubs, gallops RESPIRATORY:  Clear to auscultation without rales, wheezing or rhonchi  ABDOMEN: Soft, non-tender, non-distended EXTREMITIES:  No edema; No deformity   ASSESSMENT AND PLAN  Atherosclerotic heart disease of native coronary artery status post coronary bypass graft Status post CABG x4 five years ago. No recent cardiac symptoms. Stress test indicates low myocardial infarction risk. Shortness of breath due to asthma and back pain. Syncope likely from dehydration and hypotension. - Ensure adequate hydration. - Continue current cardiac management.  Essential hypertension Blood pressure control is part of cardiovascular management.  Hyperlipidemia LDL cholesterol at 61 mg/dL. Triglycerides normal.  Asthma Shortness of breath during walking, possibly due to back pain. - Continue Xolair injections biweekly. - Monitor symptoms and adjust treatment as necessary.  Chronic back pain Contributing to shortness of breath during walking.  Right leg pain Deep ache at night, possibly sciatica or strain. Pain from ankle to hip, affecting sleep. - Discuss with Dr. Tobie for further evaluation. - Try light stretching exercises. - Use lidocaine  or NSAID patches as needed. - Consider Tylenol  PM for nighttime pain  relief.  Pre-diabetes Hemoglobin A1c at 5.8%, borderline pre-diabetes. - Recheck A1c at next visit with Dr. Tobie.     Dispo: He can follow-up in a year  Signed, Orren LOISE Fabry, PA-C

## 2024-05-24 ENCOUNTER — Encounter: Payer: Self-pay | Admitting: Physician Assistant

## 2024-05-24 ENCOUNTER — Ambulatory Visit: Attending: Cardiology | Admitting: Physician Assistant

## 2024-05-24 VITALS — BP 147/76 | HR 65 | Resp 16 | Ht 64.0 in

## 2024-05-24 DIAGNOSIS — E785 Hyperlipidemia, unspecified: Secondary | ICD-10-CM | POA: Insufficient documentation

## 2024-05-24 DIAGNOSIS — I493 Ventricular premature depolarization: Secondary | ICD-10-CM | POA: Insufficient documentation

## 2024-05-24 DIAGNOSIS — I10 Essential (primary) hypertension: Secondary | ICD-10-CM | POA: Insufficient documentation

## 2024-05-24 DIAGNOSIS — Z951 Presence of aortocoronary bypass graft: Secondary | ICD-10-CM | POA: Diagnosis not present

## 2024-05-24 DIAGNOSIS — I25118 Atherosclerotic heart disease of native coronary artery with other forms of angina pectoris: Secondary | ICD-10-CM | POA: Insufficient documentation

## 2024-05-24 DIAGNOSIS — I491 Atrial premature depolarization: Secondary | ICD-10-CM | POA: Diagnosis not present

## 2024-05-24 DIAGNOSIS — H353124 Nonexudative age-related macular degeneration, left eye, advanced atrophic with subfoveal involvement: Secondary | ICD-10-CM | POA: Diagnosis not present

## 2024-05-24 DIAGNOSIS — I2510 Atherosclerotic heart disease of native coronary artery without angina pectoris: Secondary | ICD-10-CM | POA: Diagnosis not present

## 2024-05-24 DIAGNOSIS — R079 Chest pain, unspecified: Secondary | ICD-10-CM | POA: Diagnosis not present

## 2024-05-24 NOTE — Patient Instructions (Signed)
 Medication Instructions:  Your physician recommends that you continue on your current medications as directed. Please refer to the Current Medication list given to you today. *If you need a refill on your cardiac medications before your next appointment, please call your pharmacy*  Lab Work: None ordered If you have labs (blood work) drawn today and your tests are completely normal, you will receive your results only by: MyChart Message (if you have MyChart) OR A paper copy in the mail If you have any lab test that is abnormal or we need to change your treatment, we will call you to review the results.  Testing/Procedures: None ordered  Follow-Up: At [facility], you and your health needs are our priority.  As part of our continuing mission to provide you with exceptional heart care, our providers are all part of one team.  This team includes your primary Cardiologist (physician) and Advanced Practice Providers or APPs (Physician Assistants and Nurse Practitioners) who all work together to provide you with the care you need, when you need it.  Your next appointment:   12 month(s)  Provider:   K Chad Hilty, MD or Orren Fabry, [place]   We recommend signing up for the patient portal called MyChart.  Sign up information is provided on this After Visit Summary.  MyChart is used to connect with patients for Virtual Visits (Telemedicine).  Patients are able to view lab/test results, encounter notes, upcoming appointments, etc.  Non-urgent messages can be sent to your provider as well.   To learn more about what you can do with MyChart, go to [email or web address].   Other Instructions

## 2024-05-31 ENCOUNTER — Ambulatory Visit (INDEPENDENT_AMBULATORY_CARE_PROVIDER_SITE_OTHER)

## 2024-05-31 DIAGNOSIS — J339 Nasal polyp, unspecified: Secondary | ICD-10-CM

## 2024-06-12 ENCOUNTER — Ambulatory Visit: Admitting: Internal Medicine

## 2024-06-12 ENCOUNTER — Other Ambulatory Visit: Payer: Self-pay | Admitting: Internal Medicine

## 2024-06-12 ENCOUNTER — Encounter: Payer: Self-pay | Admitting: Internal Medicine

## 2024-06-12 VITALS — BP 120/78 | HR 58 | Ht 64.0 in | Wt 171.0 lb

## 2024-06-12 DIAGNOSIS — Z23 Encounter for immunization: Secondary | ICD-10-CM

## 2024-06-12 DIAGNOSIS — J45909 Unspecified asthma, uncomplicated: Secondary | ICD-10-CM | POA: Diagnosis not present

## 2024-06-12 NOTE — Progress Notes (Addendum)
 Subjective:   Patient ID: Antonio Carlson, male    DOB: 15-Jul-1946   MRN: [id number]   Brief patient profile:  78 yowm quit smoking in 1971 with history of [facility] Asthma and hyperlipidemia and hbp  Brief patient profile:  06/23/2016 NP  Follow up : HTN, Hyperlipidemia/[facility] Ast[facility]  Pt returns for 3 month follow up and med review .  We reviewed all his meds and organized them into a med calendar w/ pt education . He appears to be taking correctly except for flonase  , has not been using on regular basis.  Complains of 1 week of nasal congestion , drainage, stuffiness, sneezing, and ear fullness. No fever or sinus pain. Minimal cough .   rec Prednisone  taper as directed.  Follow med calendar closely and bring to each visit.  Restart Flonase        11/19/2022  f/u ov/Antonio Carlson Carlson/Antonio Carlson re: triad asthma maint on pulm/performist  > referred to ENT in [place] by PCP - supposed to have prednisone  prn as part of action plan but ran out and not refilled  Chief Complaint  Patient presents with   Follow-up    Pt f/u states that his sinuses feel clearer compared to previous OV. He still has some nasal congestion but it is mild  Dyspnea:   Not limited by breathing from desired activities   Cough: minimal / assoc pnd/ clear at  present  Sleeping: level bed / 2 pillows less than before  SABA use: never  02: none  Rec If breathing/ wheezing or nasal congestion worsen > prednisone  10 mg x 2 with breakfast until better then 1 daily x 5 days and stop   02/25/2023  f/u ov/Antonio Carlson/Antonio Carlson re: triad asthma  maint on berformist/bud/flonase  anticipating nasal polypectomy 2011 last one next one Mar 12 2023 in [place]  Chief Complaint  Patient presents with   Asthma   Dyspnea:  not limited / last pred x 3 weeks  Cough: pnds/nasal congestion > cloudy Sleeping: flat bed with one pillow s resp cc  SABA use: none  02: none  Rec No change in your maintenance nebulizers = performist / budesonide   or as needed prednisone   Be sure to tell your anesthesiologist you've been on prednisone  in the past Only use your albuterol  nebulizer as a rescue medication   03/12/23 BILATERAL MAXILLARY ANTROSTOMY WITH TISSUE REMOVAL BILATERAL ETHMOIDECTOMY AND SPHENOIDOTOMY WITH TISSUE REMOVAL BILATERAL FRONTAL SINUSOTOMY NAVIGATION     09/20/2023  f/u ov/ Carlson/Antonio Carlson re: triad asthma  maint on performist bud   Chief Complaint  Patient presents with   Follow-up    asthma  Dyspnea:  worse since last ov has not tried plan D , some better if uses saba prior to ex / no ex cp  Cough: white but thick  Sleeping: sleeping fine s resp cc  SABA use: sev times a day  02: none  Rec Plan A = Automatic = Always=    performist/bud 0.5 mg  first thing and 12 hours later just the performist  Rinse and gargle with water  when done.  If mouth or throat bother you at all,  try brushing teeth/gums/tongue with arm and hammer toothpaste/ make a slurry and gargle and spit out.   Plan B = Backup (to supplement plan A, not to replace it) use your albuterol  inhaler as a rescue medication Plan C = Crisis (instead of Plan B but only if Plan B stops working) - only use your albuterol  nebulizer if you  first try Plan B  Also  Ok to try albuterol  15 min before an activity (on alternating days)  that you know would usually make you short of breath    Plan D = Deltasone  10 mg  If ABC not working use  take 2 daily until better then one daily x 5 days and stop  For cough > mucinex  dm 1200 mg every 12 hours as needed   My Carlson will be contacting you by phone for referral to [place] Carlson   See ENT re wax removal and evaluation of hearing loss in Left ear  Please schedule a follow up visit in 3 months but call sooner if needed     02/14/2024  f/u ov/Antonio Carlson/Antonio Carlson re: triad  maint on above rx with   last pred  a week or two prior to ov for nasal congestion  Chief Complaint  Patient presents with   Follow-up     Asthma  Congestion w/ clear mucus  Dyspnea:  Not limited by breathing from desired activities   Cough: assoc with nasal congestion / no purulent secretions Sleeping: flat bed/ 2 pillows s    resp cc  SABA use: maybe once a day / uses  ex seems to help 02: none  Rec No change in medications Keep up with your ENT and Carlson follow up appointments and recommendations    06/12/2024  f/u ov/Antonio Carlson/Antonio Carlson re: formoterol / bud twice daily  maint on xolair  12/30/23  Chief Complaint  Patient presents with   Medical Management of Chronic Issues   Asthma    Breathing is overall doing well.   Dyspnea:  fast walking /limited by back  Cough: none  Sleeping: flat bed one pillow s  resp cc  SABA use: rarely needing  02: none     No obvious day to day or daytime variability or assoc excess/ purulent sputum or mucus plugs or hemoptysis or cp or chest tightness, subjective wheeze or overt sinus or hb symptoms.    Also denies any obvious fluctuation of symptoms with weather or environmental changes or other aggravating or alleviating factors except as outlined above   No unusual exposure hx or h/o childhood pna/ asthma or knowledge of premature birth.  Current Allergies, Complete Past Medical History, Past Surgical History, Family History, and Social History were reviewed in [facility]son.  ROS  The following are not active complaints unless bolded Hoarseness, sore throat, dysphagia, dental problems, itching, sneezing,  nasal congestion or discharge of excess mucus or purulent secretions, ear ache,   fever, chills, sweats, unintended wt loss or wt gain, classically pleuritic or exertional cp,  orthopnea pnd or arm/hand swelling  or leg swelling, presyncope, palpitations, abdominal pain, anorexia, nausea, vomiting, diarrhea  or change in bowel habits or change in bladder habits, change in stools or change in urine, dysuria, hematuria,  rash, arthralgias, visual  complaints, headache, numbness, weakness or ataxia or problems with walking or coordination,  change in mood or  memory.        Current Meds  Medication Sig   acetaminophen  (TYLENOL ) 500 MG tablet Take 500-1,000 mg by mouth every 6 (six) hours as needed for moderate pain or headache.   albuterol  (PROVENTIL ) (2.5 MG/3ML) 0.083% nebulizer solution Take 3 mLs (2.5 mg total) by nebulization every 4 (four) hours as needed for wheezing or shortness of breath.   albuterol  (VENTOLIN  HFA) 108 (90 Base) MCG/ACT inhaler Inhale 2 puffs into the lungs every 6 (  six) hours as needed for wheezing or shortness of breath.   alfuzosin (UROXATRAL) 10 MG 24 hr tablet Take 10 mg by mouth daily.   atorvastatin  (LIPITOR ) 80 MG tablet TAKE 1 TABLET BY MOUTH DAILY AT 6 PM.   azelastine  (ASTELIN ) 0.1 % nasal spray Place 2 sprays into both nostrils 2 (two) times daily as needed for rhinitis. Use in each nostril as directed   budesonide  (PULMICORT ) 0.5 MG/2ML nebulizer solution Take 2 mLs (0.5 mg total) by nebulization daily.   CALCIUM  PO Take 1,200 mg by mouth daily.   clopidogrel  (PLAVIX ) 75 MG tablet TAKE 1 TABLET BY MOUTH EVERY DAY   EPINEPHrine  (EPIPEN  2-PAK) 0.3 mg/0.3 mL IJ SOAJ injection Inject 0.3 mg into the muscle as needed for anaphylaxis.   ezetimibe  (ZETIA ) 10 MG tablet TAKE 1 TABLET BY MOUTH EVERY DAY   famotidine  (PEPCID ) 20 MG tablet ONE AFTER SUPPER   fluticasone  (FLONASE ) 50 MCG/ACT nasal spray Place 2 sprays into both nostrils daily.   formoterol  (PERFOROMIST ) 20 MCG/2ML nebulizer solution Take 2 mLs (20 mcg total) by nebulization 2 (two) times daily. Asthma J45.909   furosemide  (LASIX ) 20 MG tablet Take 1 tablet (20 mg total) by mouth daily as needed.   metoprolol  tartrate (LOPRESSOR ) 50 MG tablet Take 1 tablet (50 mg total) by mouth 2 (two) times daily.   montelukast  (SINGULAIR ) 10 MG tablet Take 1 tablet (10 mg total) by mouth at bedtime.   Multiple Vitamins-Minerals (ONE-A-DAY 50 PLUS PO) Take by  mouth.   Multiple Vitamins-Minerals (PRESERVISION AREDS 2 PO) Take by mouth.   nitroGLYCERIN  (NITROSTAT ) 0.4 MG SL tablet Place 1 tablet (0.4 mg total) under the tongue every 5 (five) minutes as needed for chest pain.   pantoprazole  (PROTONIX ) 40 MG tablet TAKE 1 TABLET BY MOUTH EVERY DAY   telmisartan  (MICARDIS ) 80 MG tablet TAKE 1 TABLET BY MOUTH EVERY DAY   Current Facility-Administered Medications for the 06/12/24 encounter (Carlson Visit) with Darlean Ozell NOVAK, MD  Medication   omalizumab CIPRIANO) prefilled syringe 300 mg                 PMH - PCP = R Patel / [place]  [facility].  Syncope  - Holter ordered June 27, 2008 > nl  - EP consult June 27, 2008  OSTEOPENIA (ICD-733.90)  COLONIC POLYPS (ICD-211.3) .......................................SABRA Princella Nida - see colonoscopy 11/07/10 (repeat in 10 yr)  DIVERTICULOSIS, MILD (ICD-562.10) ASTHMA (ICD-493.90)  - HFA 90% June 11, 2009  ALLERGIC RHINITIS, CHRONIC (ICD-477.9)  - steroid dep until mid oct 2009  - Carlson profile sent January 23, 2010 >> IgE 18.3  - Polypectomy Sept 2012 ......................................................  [name]  BENIGN PROSTATIC HYPERTROPHY, HX OF (ICD-V13.8) .SABRA... 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  [facility]  HEALTH MAINTENANCE.......................................................SABRA referred [facility]/23/2021  R C6/7 radiculopathy 2015 .....................................................SABRAMalcolm  - Pneumovax 11/2004 and @ age 5 June 11, 2009 and Prevnar 13 08/14/2013  - Td 07/2005   And  08/01/2015  - 2nd Covid May 2021        Past Surgical History:  Appendectomy  Colonoscopy     Family History:  heart disease in his father onset at age 14 he was a smoker  Ca brain half brother  Half siblings  - one lung ca,  one breast ca, one brain ca Mother dementia onset late 39s lived to be 52    Social History:  quit smoking 1971  rarely drink alcohol  Retired  Widower 2018  - remarried sept 2020        Objective:   Physical Exam  Wts  06/12/2024  171  02/14/2024    175  09/20/2023   182  06/10/2023  186  02/25/2023     186  11/19/2022   184  10/08/2022   179  09/23/2021   175  12/30/2020     172 07/01/2020   177 01/01/2020      175  10/09/2019    170   03/16/2019   179  03/14/2013    182 10/221/09    198     Vital signs reviewed  06/12/2024  - Note at rest 02 sats  97% on RA   General appearance:    amb somber wm nad    HEENT : Oropharynx  clear      Nasal turbinates mild non-specific edema/ no polyps   NECK :  without  apparent JVD/ palpable Nodes/TM > minimal pseudowheeze, resolves with PLM   LUNGS: no acc muscle use,  Nl contour chest which is clear to A and P bilaterally without cough on insp or exp maneuvers   CV:  RRR  no s3 or murmur or increase in P2, and no edema   ABD:  soft and nontender   MS:  Gait nl   ext warm without deformities Or obvious joint restrictions  calf tenderness, cyanosis or clubbing    SKIN: warm and dry without lesions    NEURO:  alert, approp, nl sensorium with  no motor or cerebellar deficits apparent.      Assessment & Plan Triad asthma Quit smoking 1971  - PFT's  09/16/2015  FEV1 1.81 (61 % ) ratio 56  p 44 % improvement from saba with DLCO  81/81c % corrects to 107  % for alv volume  Done prior to any am meds  - 06/16/2018  After extensive coaching inhaler device,  effectiveness =    90%  - 10/2018 rx prednisone  as plan C x 6 days - Carlson profile 10/09/19   >  Eos 0.9 /  IgE  106 - 01/01/2020 submitted paperwork for dupixent > did not go thru > referred to Dr Kozlow 07/01/2020  - 12/30/2020 referred again to Dr Rowan service  - improved p starting dupixent  07/03/21  but only received 2 infusions the ? Adverse drug effects = arthralgias/  pnneumonia  - 11/19/2022 prn prednisone  restarted:  prednisone  10 mg x 2 with breakfast until better then 1 daily x 5 days and stop - 06/10/2023 resumed pred as above as plan D - 09/20/2023 referred back to [place] Carlson for Carlson and again resumed pred as plan D - 02/14/2024  After extensive coaching inhaler device,  effectiveness =    90%  hfa  - Carlson screen  02/14/24 >  Eos 0.9   - Xolair rx per Carlson/ Drena  05/01/24 >>>   Already convinced doing better on xolair with less nasal symptoms and better brreathing/ less need for saba   Presently: All goals of chronic asthma control met including optimal function and elimination of symptoms with minimal need for rescue therapy.  Contingencies discussed in full including contacting this Carlson immediately if not controlling the symptoms using the rule of two's.     F/u can be q 6 m, sooner prn     High dose influenza given today at pt request but defer all other health maint  to PCP      Each maintenance medication was reviewed in detail including emphasizing most importantly the difference between maintenance and prns and under what circumstances the prns are to be triggered using an action plan format where appropriate.  Total time for H and P, chart review, counseling, reviewing hfa device(s) and generating customized AVS unique to this Carlson visit / same day charting = 31  min           AVS  Patient Instructions  No change in medications   Please schedule a follow up visit in 6  months but call sooner if needed    Ozell America, MD 06/12/2024

## 2024-06-12 NOTE — Addendum Note (Signed)
 Addended by: DARLEAN SHARPER B on: 06/12/2024 09:20 AM   Modules accepted: Level of Service

## 2024-06-12 NOTE — Assessment & Plan Note (Addendum)
 Quit smoking 1971  - PFT's  09/16/2015  FEV1 1.81 (61 % ) ratio 56  p 44 % improvement from saba with DLCO  81/81c % corrects to 107  % for alv volume  Done prior to any am meds  - 06/16/2018  After extensive coaching inhaler device,  effectiveness =    90%  - 10/2018 rx prednisone  as plan C x 6 days - Allergy profile 10/09/19   >  Eos 0.9 /  IgE  106 - 01/01/2020 submitted paperwork for dupixent > did not go thru > referred to Dr Maurilio 07/01/2020  - 12/30/2020 referred again to Dr Rowan service  - improved p starting dupixent  07/03/21  but only received 2 infusions the ? Adverse drug effects = arthralgias/ pnneumonia  - 11/19/2022 prn prednisone  restarted:  prednisone  10 mg x 2 with breakfast until better then 1 daily x 5 days and stop - 06/10/2023 resumed pred as above as plan D - 09/20/2023 referred back to [facility] office for allergy and again resumed pred as plan D - 02/14/2024  After extensive coaching inhaler device,  effectiveness =    90%  hfa  - Allergy screen  02/14/24 >  Eos 0.9   - Xolair rx per Allergy/ Drena  05/01/24 >>>   Already convinced doing better on xolair with less nasal symptoms and better brreathing/ less need for saba   Presently: All goals of chronic asthma control met including optimal function and elimination of symptoms with minimal need for rescue therapy.  Contingencies discussed in full including contacting this office immediately if not controlling the symptoms using the rule of two's.     F/u can be q 6 m, sooner prn     High dose influenza given today at pt request but defer all other health maint to PCP      Each maintenance medication was reviewed in detail including emphasizing most importantly the difference between maintenance and prns and under what circumstances the prns are to be triggered using an action plan format where appropriate.  Total time for H and P, chart review, counseling, reviewing hfa device(s) and generating customized AVS unique to this  office visit / same day charting = 31  min

## 2024-06-12 NOTE — Addendum Note (Signed)
 Addended by: Shatyra Becka M on: 06/12/2024 09:19 AM   Modules accepted: Orders

## 2024-06-12 NOTE — Patient Instructions (Signed)
 No change in medications    Please schedule a follow up visit in 6  months but call sooner if needed

## 2024-06-14 ENCOUNTER — Ambulatory Visit: Admitting: Internal Medicine

## 2024-06-14 ENCOUNTER — Encounter: Payer: Self-pay | Admitting: Internal Medicine

## 2024-06-14 ENCOUNTER — Ambulatory Visit

## 2024-06-14 VITALS — BP 136/77 | HR 61 | Ht 68.0 in | Wt 171.0 lb

## 2024-06-14 DIAGNOSIS — J339 Nasal polyp, unspecified: Secondary | ICD-10-CM | POA: Diagnosis not present

## 2024-06-14 DIAGNOSIS — I10 Essential (primary) hypertension: Secondary | ICD-10-CM | POA: Diagnosis not present

## 2024-06-14 DIAGNOSIS — J45909 Unspecified asthma, uncomplicated: Secondary | ICD-10-CM | POA: Diagnosis not present

## 2024-06-14 DIAGNOSIS — R7303 Prediabetes: Secondary | ICD-10-CM | POA: Diagnosis not present

## 2024-06-14 DIAGNOSIS — I5032 Chronic diastolic (congestive) heart failure: Secondary | ICD-10-CM

## 2024-06-14 DIAGNOSIS — N400 Enlarged prostate without lower urinary tract symptoms: Secondary | ICD-10-CM

## 2024-06-14 DIAGNOSIS — I110 Hypertensive heart disease with heart failure: Secondary | ICD-10-CM | POA: Diagnosis not present

## 2024-06-14 DIAGNOSIS — Z886 Allergy status to analgesic agent status: Secondary | ICD-10-CM

## 2024-06-14 DIAGNOSIS — I25118 Atherosclerotic heart disease of native coronary artery with other forms of angina pectoris: Secondary | ICD-10-CM

## 2024-06-14 NOTE — Assessment & Plan Note (Signed)
 BP Readings from Last 1 Encounters:  06/14/24 136/77   Well-controlled with Telmisartan  80 mg QD and Metoprolol  25 mg BID Counseled for compliance with the medications Advised DASH diet and moderate exercise/walking as tolerated

## 2024-06-14 NOTE — Progress Notes (Signed)
 Established Patient Office Visit  Subjective:  Patient ID: Antonio Carlson, male    DOB: 11/23/1945  Age: 78 y.o. MRN: [id number]  CC:  Chief Complaint  Patient presents with   Hypertension    Six month follow up   Asthma    Six month follow up    HPI Antonio Carlson is a 78 y.o. male with past medical history of HTN, CAD s/p CABG, asthma, allergic rhinitis and OA of hip who presents for f/u of his chronic medical conditions.  HTN: BP is well-controlled. Takes Telmisartan  80 mg QD and Metoprolol  25 mg BID regularly. Patient denies headache, dizziness, or palpitations.  He was given spironolactone  for HFpEF by his Cardiologist, but had hyperkalemia with it. He has Lasix  for as needed use for leg swelling.  CAD s/p CABG: He is on Plavix  and statin currently.  He also takes metoprolol  for it.  He denies any anginal chest pain or dyspnea currently.  He has history of asthma and allergic rhinitis, for which he uses Perforomist  and Pulmicort  neb. He is also getting Xolair now. He is followed by Pulmonology, and Allergy and Immunology clinic. Denies any fever or chills.  He denies any dyspnea or wheezing currently. He has tried Dupixent , but had an allergic reaction to it.  He denies any fever or chills.  He has chronic sinusitis and nasal polyps, and had nasal surgery by [facility] ENT specialist in 08/24. He recently saw [facility] ENT specialist for pulsatile tinnitus. He had hearing evaluation, which was stable compared to prior.  He had MRI of the brain as well, which was negative for any mass.   Past Medical History:  Diagnosis Date   Allergy    Anginal pain    Arthritis    Asthma    Benign prostatic hypertrophy    Coronary artery disease    Diverticulosis    Dyspnea    GERD (gastroesophageal reflux disease)    History of kidney stones    Hyperlipidemia    Hypertension    Nephrolithiasis    Pleural effusion on right    Pneumonia    PVC's (premature ventricular contractions)     RECTAL BLEEDING 09/15/2010   Qualifier: Diagnosis of  By: Ever PA-c, Amy S    Shingles    twice   Syncope     Past Surgical History:  Procedure Laterality Date   APPENDECTOMY     CORONARY ARTERY BYPASS GRAFT N/A 05/01/2019   Procedure: CORONARY ARTERY BYPASS GRAFTING (CABG) TIMES FOUR USING LEFT AND RIGHT INTERNAL MAMMARY ARTERIES AND RIGHT RADIAL ARTERY WITH STERNAL PLATING;  Surgeon: German Bartlett PEDLAR, MD;  Location: [facility] OR;  Service: Open Heart Surgery;  Laterality: N/A;   EYE SURGERY Bilateral    catartact surgery   LEFT HEART CATH AND CORONARY ANGIOGRAPHY N/A 04/18/2019   Procedure: LEFT HEART CATH AND CORONARY ANGIOGRAPHY;  Surgeon: Claudene Victory ORN, MD;  Location: [facility] INVASIVE CV LAB;  Service: Cardiovascular;  Laterality: N/A;   NASAL SINUS SURGERY  03/28/2011   NASAL SINUS SURGERY  03/12/2023   RADIAL ARTERY HARVEST Right 05/01/2019   Procedure: Right Radial Artery Harvest;  Surgeon: German Bartlett PEDLAR, MD;  Location: [facility] OR;  Service: Open Heart Surgery;  Laterality: Right;   TEE WITHOUT CARDIOVERSION N/A 05/01/2019   Procedure: TRANSESOPHAGEAL ECHOCARDIOGRAM (TEE);  Surgeon: German Bartlett PEDLAR, MD;  Location: [facility] OR;  Service: Open Heart Surgery;  Laterality: N/A;   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   Procedure:  TOTAL HIP ARTHROPLASTY ANTERIOR APPROACH;  Surgeon: Melodi Lerner, MD;  Location: [facility] ORS;  Service: Orthopedics;  Laterality: Left;    Family History  Problem Relation Age of Onset   Heart attack Father     Social History   Socioeconomic History   Marital status: Married    Spouse name: Not on file   Number of children: Not on file   Years of education: Not on file   Highest education level: Not on file  Occupational History   Not on file  Tobacco Use   Smoking status: Former    Current packs/day: 0.00    Average packs/day: 2.0 packs/day for 14.0 years (28.0 ttl pk-yrs)    Types: Cigarettes    Start date: 07/28/1955    Quit date: 07/27/1969     Years since quitting: 54.9   Smokeless tobacco: Never  Vaping Use   Vaping status: Never Used  Substance and Sexual Activity   Alcohol use: Not Currently    Comment: beer occ   Drug use: Never   Sexual activity: Yes  Other Topics Concern   Not on file  Social History Narrative   Not on file   Social Drivers of Health   Financial Resource Strain: Low Risk  (01/13/2024)   Overall Financial Resource Strain (CARDIA)    Difficulty of Paying Living Expenses: Not hard at all  Food Insecurity: No Food Insecurity (01/13/2024)   Hunger Vital Sign    Worried About Running Out of Food in the Last Year: Never true    Ran Out of Food in the Last Year: Never true  Transportation Needs: No Transportation Needs (01/13/2024)   PRAPARE - Administrator, Civil Service (Medical): No    Lack of Transportation (Non-Medical): No  Physical Activity: Sufficiently Active (01/13/2024)   Exercise Vital Sign    Days of Exercise per Week: 7 days    Minutes of Exercise per Session: 30 min  Stress: No Stress Concern Present (01/13/2024)   [employer] of Occupational Health - Occupational Stress Questionnaire    Feeling of Stress: Not at all  Social Connections: Socially Integrated (01/13/2024)   Social Connection and Isolation Panel    Frequency of Communication with Friends and Family: More than three times a week    Frequency of Social Gatherings with Friends and Family: More than three times a week    Attends Religious Services: More than 4 times per year    Active Member of [employer] or Organizations: Yes    Attends Engineer, Structural: More than 4 times per year    Marital Status: Married  Catering Manager Violence: Not At Risk (01/13/2024)   Humiliation, Afraid, Rape, and Kick questionnaire    Fear of Current or Ex-Partner: No    Emotionally Abused: No    Physically Abused: No    Sexually Abused: No    Outpatient Medications Prior to Visit  Medication Sig Dispense Refill    acetaminophen  (TYLENOL ) 500 MG tablet Take 500-1,000 mg by mouth every 6 (six) hours as needed for moderate pain or headache.     albuterol  (PROVENTIL ) (2.5 MG/3ML) 0.083% nebulizer solution Take 3 mLs (2.5 mg total) by nebulization every 4 (four) hours as needed for wheezing or shortness of breath. 75 mL 12   albuterol  (VENTOLIN  HFA) 108 (90 Base) MCG/ACT inhaler Inhale 2 puffs into the lungs every 6 (six) hours as needed for wheezing or shortness of breath. 8 g 0   alfuzosin (UROXATRAL) 10  MG 24 hr tablet Take 10 mg by mouth daily.     atorvastatin  (LIPITOR ) 80 MG tablet TAKE 1 TABLET BY MOUTH DAILY AT 6 PM. 90 tablet 2   azelastine  (ASTELIN ) 0.1 % nasal spray Place 2 sprays into both nostrils 2 (two) times daily as needed for rhinitis. Use in each nostril as directed 30 mL 5   budesonide  (PULMICORT ) 0.5 MG/2ML nebulizer solution Take 2 mLs (0.5 mg total) by nebulization daily. 60 mL 11   CALCIUM  PO Take 1,200 mg by mouth daily.     clopidogrel  (PLAVIX ) 75 MG tablet TAKE 1 TABLET BY MOUTH EVERY DAY 90 tablet 1   EPINEPHrine  (EPIPEN  2-PAK) 0.3 mg/0.3 mL IJ SOAJ injection Inject 0.3 mg into the muscle as needed for anaphylaxis. 2 each 1   ezetimibe  (ZETIA ) 10 MG tablet TAKE 1 TABLET BY MOUTH EVERY DAY 90 tablet 2   famotidine  (PEPCID ) 20 MG tablet ONE AFTER SUPPER 90 tablet 3   fluticasone  (FLONASE ) 50 MCG/ACT nasal spray Place 2 sprays into both nostrils daily. 16 g 6   formoterol  (PERFOROMIST ) 20 MCG/2ML nebulizer solution Take 2 mLs (20 mcg total) by nebulization 2 (two) times daily. Asthma J45.909 360 mL 11   furosemide  (LASIX ) 20 MG tablet Take 1 tablet (20 mg total) by mouth daily as needed. 90 tablet 3   metoprolol  tartrate (LOPRESSOR ) 50 MG tablet Take 1 tablet (50 mg total) by mouth 2 (two) times daily. 180 tablet 2   montelukast  (SINGULAIR ) 10 MG tablet Take 1 tablet (10 mg total) by mouth at bedtime. 90 tablet 1   Multiple Vitamins-Minerals (ONE-A-DAY 50 PLUS PO) Take by mouth.      Multiple Vitamins-Minerals (PRESERVISION AREDS 2 PO) Take by mouth.     nitroGLYCERIN  (NITROSTAT ) 0.4 MG SL tablet Place 1 tablet (0.4 mg total) under the tongue every 5 (five) minutes as needed for chest pain. 25 tablet 3   pantoprazole  (PROTONIX ) 40 MG tablet TAKE 1 TABLET BY MOUTH EVERY DAY 90 tablet 3   predniSONE  (DELTASONE ) 10 MG tablet 2 daily until breathing and nasal congestion better then 1 daily x 5 days and stop (Patient not taking: Reported on 06/12/2024) 100 tablet 2   telmisartan  (MICARDIS ) 80 MG tablet TAKE 1 TABLET BY MOUTH EVERY DAY 90 tablet 3   Facility-Administered Medications Prior to Visit  Medication Dose Route Frequency Provider Last Rate Last Admin   omalizumab CIPRIANO) prefilled syringe 300 mg  300 mg Subcutaneous Q14 Days Luke Needle M, DO   300 mg at 05/31/24 9064    Allergies  Allergen Reactions   Aspirin Other (See Comments)    Pt states I can't breath   Clavulanic Acid     Other Reaction(s): Unknown   Sulfonamide Derivatives Rash    Other Reaction(s): Other  Doesn't remember   Amoxicillin     GI upset Did it involve swelling of the face/tongue/throat, SOB, or low BP? No Did it involve sudden or severe rash/hives, skin peeling, or any reaction on the inside of your mouth or nose? No Did you need to seek medical attention at a hospital or doctor's office? No When did it last happen?    20+ years   If all above answers are NO, may proceed with cephalosporin use.     ROS Review of Systems  Constitutional:  Negative for chills and fever.  HENT:  Positive for congestion and postnasal drip. Negative for sore throat.   Eyes:  Negative for pain and discharge.  Respiratory:  Positive for cough (Intermittent). Negative for shortness of breath.   Cardiovascular:  Positive for leg swelling. Negative for chest pain and palpitations.  Gastrointestinal:  Negative for diarrhea, nausea and vomiting.  Endocrine: Negative for polydipsia and polyuria.   Genitourinary:  Negative for dysuria and hematuria.  Musculoskeletal:  Positive for arthralgias and back pain. Negative for neck pain and neck stiffness.  Skin:  Negative for rash.  Neurological:  Positive for numbness. Negative for dizziness, weakness and headaches.  Psychiatric/Behavioral:  Negative for agitation and behavioral problems.       Objective:    Physical Exam Vitals reviewed.  Constitutional:      General: He is not in acute distress.    Appearance: He is not diaphoretic.  HENT:     Head: Normocephalic and atraumatic.     Nose: Congestion present.     Mouth/Throat:     Mouth: Mucous membranes are moist.  Eyes:     General: No scleral icterus.    Extraocular Movements: Extraocular movements intact.  Cardiovascular:     Rate and Rhythm: Normal rate and regular rhythm.     Heart sounds: Normal heart sounds. No murmur heard. Pulmonary:     Breath sounds: Normal breath sounds. No wheezing or rales.  Musculoskeletal:     Cervical back: Neck supple. No tenderness.     Right lower leg: No edema.     Left lower leg: No edema.  Skin:    General: Skin is warm.     Findings: No rash.  Neurological:     General: No focal deficit present.     Mental Status: He is alert and oriented to person, place, and time.  Psychiatric:        Mood and Affect: Mood normal.        Behavior: Behavior normal.     BP 136/77   Pulse 61   Ht 5' 8 (1.727 m)   Wt 171 lb (77.6 kg)   SpO2 98%   BMI 26.00 kg/m  Wt Readings from Last 3 Encounters:  06/14/24 171 lb (77.6 kg)  06/12/24 171 lb (77.6 kg)  03/20/24 171 lb 6.4 oz (77.7 kg)    Lab Results  Component Value Date   TSH 1.680 12/13/2023   Lab Results  Component Value Date   WBC 7.5 02/14/2024   HGB 14.2 02/14/2024   HCT 44.4 02/14/2024   MCV 84 02/14/2024   PLT 306 02/14/2024   Lab Results  Component Value Date   NA 142 12/13/2023   K 5.0 12/13/2023   CO2 25 12/13/2023   GLUCOSE 93 12/13/2023   BUN 18  12/13/2023   CREATININE 1.05 12/13/2023   BILITOT 0.5 12/13/2023   ALKPHOS 142 (H) 12/13/2023   AST 23 12/13/2023   ALT 18 12/13/2023   PROT 6.2 12/13/2023   ALBUMIN  4.0 12/13/2023   CALCIUM  9.2 12/13/2023   ANIONGAP 10 06/05/2023   EGFR 72 12/13/2023   GFR 78.99 07/01/2021   Lab Results  Component Value Date   CHOL 124 12/13/2023   Lab Results  Component Value Date   HDL 46 12/13/2023   Lab Results  Component Value Date   LDLCALC 61 12/13/2023   Lab Results  Component Value Date   TRIG 90 12/13/2023   Lab Results  Component Value Date   CHOLHDL 2.7 12/13/2023   Lab Results  Component Value Date   HGBA1C 5.8 (H) 12/13/2023      Assessment & Plan:  Problem List Items Addressed This Visit       Cardiovascular and Mediastinum   Essential hypertension (Chronic)   BP Readings from Last 1 Encounters:  06/14/24 136/77   Well-controlled with Telmisartan  80 mg QD and Metoprolol  25 mg BID Counseled for compliance with the medications Advised DASH diet and moderate exercise/walking as tolerated      Relevant Orders   Basic Metabolic Panel (BMET)   Coronary artery disease - Primary (Chronic)   Status post CABG On Plavix  and metoprolol  Denies any anginal pain or dyspnea currently Followed by cardiology      Chronic heart failure with preserved ejection fraction (HCC)   Echo reviewed - Grade 1 diastolic dysfunction. Recent cardiology visit note reviewed On metoprolol , telmisartan  and as needed Lasix  Had hyperkalemia with spironolactone  Has chronic leg swelling, improved with Lasix         Respiratory   Triad asthma (Chronic)   Usually well controlled with Pulmicort  and Perforomist  neb Did not tolerate Dupixent , on Xolair now On Singulair  Followed by Dr Darlean and Allergy and Immunology clinic        Genitourinary   BPH (benign prostatic hyperplasia)   Well-controlled with alfuzosin 10 mg QD Followed by Urology        Other   Prediabetes   Lab  Results  Component Value Date   HGBA1C 5.8 (H) 12/13/2023   Advised to follow low carb diet           No orders of the defined types were placed in this encounter.   Follow-up: Return in about 6 months (around 12/12/2024).    Suzzane MARLA Blanch, MD

## 2024-06-14 NOTE — Assessment & Plan Note (Signed)
Status post CABG On Plavix and metoprolol Denies any anginal pain or dyspnea currently Followed by cardiology 

## 2024-06-14 NOTE — Assessment & Plan Note (Addendum)
 Usually well controlled with Pulmicort  and Perforomist  neb Did not tolerate Dupixent , on Xolair now On Singulair  Followed by Dr Darlean and Allergy and Immunology clinic

## 2024-06-14 NOTE — Assessment & Plan Note (Addendum)
 Well-controlled with alfuzosin 10 mg QD Followed by Urology

## 2024-06-14 NOTE — Assessment & Plan Note (Signed)
 Echo reviewed - Grade 1 diastolic dysfunction. Recent cardiology visit note reviewed On metoprolol, telmisartan and as needed Lasix On spironolactone Has chronic leg swelling, improved with Lasix

## 2024-06-14 NOTE — Patient Instructions (Addendum)
 Please continue to take medications as prescribed.  Please continue to follow low salt diet and perform moderate exercise/walking as tolerated.

## 2024-06-14 NOTE — Assessment & Plan Note (Signed)
 Lab Results  Component Value Date   HGBA1C 5.8 (H) 12/13/2023   Advised to follow low carb diet

## 2024-06-15 ENCOUNTER — Ambulatory Visit: Payer: Self-pay | Admitting: Internal Medicine

## 2024-06-15 LAB — BASIC METABOLIC PANEL WITH GFR
BUN/Creatinine Ratio: 17 (ref 10–24)
BUN: 20 mg/dL (ref 8–27)
CO2: 26 mmol/L (ref 20–29)
Calcium: 9.4 mg/dL (ref 8.6–10.2)
Chloride: 103 mmol/L (ref 96–106)
Creatinine, Ser: 1.15 mg/dL (ref 0.76–1.27)
Glucose: 90 mg/dL (ref 70–99)
Potassium: 4.7 mmol/L (ref 3.5–5.2)
Sodium: 142 mmol/L (ref 134–144)
eGFR: 64 mL/min/1.73 (ref >59)

## 2024-06-16 ENCOUNTER — Ambulatory Visit (INDEPENDENT_AMBULATORY_CARE_PROVIDER_SITE_OTHER)

## 2024-06-16 DIAGNOSIS — J339 Nasal polyp, unspecified: Secondary | ICD-10-CM

## 2024-06-21 DIAGNOSIS — R351 Nocturia: Secondary | ICD-10-CM | POA: Diagnosis not present

## 2024-06-21 DIAGNOSIS — N401 Enlarged prostate with lower urinary tract symptoms: Secondary | ICD-10-CM | POA: Diagnosis not present

## 2024-06-21 DIAGNOSIS — K4091 Unilateral inguinal hernia, without obstruction or gangrene, recurrent: Secondary | ICD-10-CM | POA: Diagnosis not present

## 2024-07-02 ENCOUNTER — Other Ambulatory Visit: Payer: Self-pay | Admitting: Internal Medicine

## 2024-07-03 ENCOUNTER — Ambulatory Visit

## 2024-07-03 DIAGNOSIS — J339 Nasal polyp, unspecified: Secondary | ICD-10-CM

## 2024-07-05 DIAGNOSIS — J339 Nasal polyp, unspecified: Secondary | ICD-10-CM | POA: Diagnosis not present

## 2024-07-05 DIAGNOSIS — R430 Anosmia: Secondary | ICD-10-CM | POA: Diagnosis not present

## 2024-07-07 ENCOUNTER — Telehealth: Payer: Self-pay

## 2024-07-07 NOTE — Telephone Encounter (Signed)
 Copied from CRM #[id number]. Topic: Clinical - Prescription Issue >> Jul 07, 2024  9:37 AM Antonio Carlson wrote: Reason for CRM:   Pt is contacting clinic regarding his prescribed formoterol  (PERFOROMIST ) 20 MCG/2ML nebulizer solution.  He has been advised by [facility] that their supplier is no longer providing this medication and he needs to know if a alternative nebulizer solution could be called in. He only has 1 more week of this medication.   Requested call back   CB#  [phone]  Please advise

## 2024-07-12 ENCOUNTER — Ambulatory Visit: Admitting: Podiatry

## 2024-07-12 ENCOUNTER — Encounter: Payer: Self-pay | Admitting: Podiatry

## 2024-07-12 DIAGNOSIS — B351 Tinea unguium: Secondary | ICD-10-CM

## 2024-07-12 DIAGNOSIS — M79675 Pain in left toe(s): Secondary | ICD-10-CM

## 2024-07-12 DIAGNOSIS — M79674 Pain in right toe(s): Secondary | ICD-10-CM | POA: Diagnosis not present

## 2024-07-12 NOTE — Progress Notes (Signed)
 This patient presents to the office with chief complaint of long thick painful nails.  Patient says the nails are painful walking and wearing shoes.  This patient is unable to self treat.  This patient is unable to trim hisnails since she is unable to reach his nails.  he presents to the office for preventative foot care services.  General Appearance  Alert, conversant and in no acute stress.  Vascular  Dorsalis pedis and posterior tibial  pulses are palpable  bilaterally.  Capillary return is within normal limits  bilaterally. Temperature is within normal limits  bilaterally.  Neurologic  Senn-Weinstein monofilament wire test within normal limits  bilaterally. Muscle power within normal limits bilaterally.  Nails Thick disfigured discolored nails with subungual debris  from hallux to fifth toes bilaterally. No evidence of bacterial infection or drainage bilaterally.  Orthopedic  No limitations of motion  feet .  No crepitus or effusions noted.  No bony pathology or digital deformities noted.  Skin  normotropic skin with no porokeratosis noted bilaterally.  No signs of infections or ulcers noted.     Onychomycosis  Nails  B/L.  Pain in right toes  Pain in left toes  Debridement of nails both feet followed trimming the nails with dremel tool.    RTC 3 months.   Helane Gunther DPM

## 2024-07-13 ENCOUNTER — Telehealth: Payer: Self-pay

## 2024-07-13 ENCOUNTER — Other Ambulatory Visit: Payer: Self-pay

## 2024-07-13 DIAGNOSIS — J339 Nasal polyp, unspecified: Secondary | ICD-10-CM

## 2024-07-13 MED ORDER — FORMOTEROL FUMARATE 20 MCG/2ML IN NEBU: 2.0000 mL | INHALATION_SOLUTION | Freq: Two times a day (BID) | RESPIRATORY_TRACT | 11 refills | Status: AC

## 2024-07-13 NOTE — Telephone Encounter (Signed)
 Copied from CRM #[id number]. Topic: Clinical - Prescription Issue >> Jul 07, 2024  9:37 AM Russell PARAS wrote: Reason for CRM:   Pt is contacting clinic regarding his prescribed formoterol  (PERFOROMIST ) 20 MCG/2ML nebulizer solution.  He has been advised by [facility] that their supplier is no longer providing this medication and he needs to know if a alternative nebulizer solution could be called in. He only has 1 more week of this medication.   Requested call back   CB#  [phone] >> Jul 12, 2024  1:20 PM Rozanna G wrote: Pt is calling back about his inhaler medicine, stated he will be out by Sunday and needs to know what substitute Dr Darlean wants you to use and it has to be sent to a local because he is will be out by Sunday. Please contact pt with information.  He stated if its a different medicine continue to use [facility] to pick it up. He stated if he does not answer leave a detail message  Called and informed pt for dr werts recs on using the albuterol  neb sol until his performist comes in the mail.

## 2024-07-17 ENCOUNTER — Ambulatory Visit

## 2024-07-17 DIAGNOSIS — J339 Nasal polyp, unspecified: Secondary | ICD-10-CM | POA: Diagnosis not present

## 2024-07-23 ENCOUNTER — Other Ambulatory Visit: Payer: Self-pay | Admitting: Internal Medicine

## 2024-07-31 ENCOUNTER — Ambulatory Visit

## 2024-07-31 DIAGNOSIS — J339 Nasal polyp, unspecified: Secondary | ICD-10-CM

## 2024-08-01 ENCOUNTER — Ambulatory Visit: Payer: Self-pay | Admitting: *Deleted

## 2024-08-01 NOTE — Telephone Encounter (Signed)
Noted patient scheduled

## 2024-08-01 NOTE — Telephone Encounter (Signed)
 FYI Only or Action Required?: FYI only for provider: appointment scheduled on 08/03/24.  Patient was last seen in primary care on 06/14/2024 by Antonio Suzzane POUR, MD.  Called Nurse Triage reporting Eye Problem.  Symptoms began yesterday.  Interventions attempted: Rest, hydration, or home remedies.  Symptoms are: gradually worsening.  Triage Disposition: See Physician Within 24 Hours  Patient/caregiver understands and will follow disposition?: Yes    Please advise if earlier appt available . Please advise if OV needed instead of VV as requested by patient. Denies fever and pain but reports soreness. See NT encounter            Copied from CRM [id number]. Topic: Clinical - Red Word Triage >> Aug 01, 2024  2:31 PM [name] wrote: Red Word that prompted transfer to Nurse Triage: left eye stye, swelling and sore Reason for Disposition  MODERATE-SEVERE eyelid swelling on one side  (Exception: Due to a mosquito bite.)  Answer Assessment - Initial Assessment Questions No available appt tomorrow. Scheduled earliest appt for 08/03/24 VV per patient request. Please advise if OV needed or if earlier visit can be scheduled.      1. ONSET: When did the swelling start? (e.g., minutes, hours, days)     Yesterday  2. LOCATION: What part of the eyelids is swollen?     Upper left eye lid  3. SEVERITY: How swollen is it?     Feels like touching eye ball and sore to touch 4. ITCHING: Is there any itching? If Yes, ask: How much?   (Scale 1-10; mild, moderate or severe)     No  5. PAIN: Is the swelling painful to touch? If Yes, ask: How painful is it?   (Scale 1-10; mild, moderate or severe)     No pain  6. FEVER: Do you have a fever? If Yes, ask: What is it, how was it measured, and when did it start?      na 7. CAUSE: What do you think is causing the swelling?     Not sure possible stye  8. RECURRENT SYMPTOM: Have you had eyelid swelling before? If Yes, ask: When was  the last time? What happened that time?     No  9. OTHER SYMPTOMS: Do you have any other symptoms? (e.g., blurred vision, eye discharge, rash, runny nose)     White pimple looking large enough covering eye , no drainage some watering but not severe. Denies fever no pain only aggravating.  10. PREGNANCY: Is there any chance you are pregnant? When was your last menstrual period?       na  Protocols used: Eye - Swelling-A-AH

## 2024-08-03 ENCOUNTER — Telehealth: Payer: Self-pay | Admitting: Internal Medicine

## 2024-08-03 ENCOUNTER — Encounter: Payer: Self-pay | Admitting: Internal Medicine

## 2024-08-03 DIAGNOSIS — H00014 Hordeolum externum left upper eyelid: Secondary | ICD-10-CM | POA: Insufficient documentation

## 2024-08-03 MED ORDER — ERYTHROMYCIN 5 MG/GM OP OINT: 1.0000 | TOPICAL_OINTMENT | Freq: Every day | OPHTHALMIC | 0 refills | Status: AC

## 2024-08-03 NOTE — Progress Notes (Signed)
 "    Virtual Visit via Video Note   Because of Antonio Carlson co-morbid illnesses, he is at least at moderate risk for complications without adequate follow up.  This format is felt to be most appropriate for this patient at this time.  All issues noted in this document were discussed and addressed.  A limited physical exam was performed with this format.      Evaluation Performed:  Follow-up visit  Date:  08/03/2024   ID:  Antonio Carlson May 10, 1946, MRN [id number]  Patient Location: Home Provider Location: Office/Clinic  Participants: Patient Location of Patient: Home Location of Provider: Telehealth Consent was obtain for visit to be over via telehealth. I verified that I am speaking with the correct person using two identifiers.  PCP:  Tobie Suzzane POUR, MD   Chief Complaint: Left upper eyelid swelling  History of Present Illness:    Antonio Carlson is a 79 y.o. male who has a video visit for complaint of left upper eyelid swelling and redness for the last 2 days.  He has tried applying warm compresses without much relief.  He has noticed clear discharge, but denies any visual disturbance currently.  Denies any fever or chills.  The patient does not have symptoms concerning for COVID-19 infection (fever, chills, cough, or new shortness of breath).   Past Medical, Surgical, Social History, Allergies, and Medications have been Reviewed.  Past Medical History:  Diagnosis Date   Allergy    Anginal pain    Arthritis    Asthma    Benign prostatic hypertrophy    Coronary artery disease    Diverticulosis    Dyspnea    GERD (gastroesophageal reflux disease)    History of kidney stones    Hyperlipidemia    Hypertension    Nephrolithiasis    Pleural effusion on right    Pneumonia    PVC's (premature ventricular contractions)    RECTAL BLEEDING 09/15/2010   Qualifier: Diagnosis of  By: Ever PA-c, Amy S    Shingles    twice   Syncope    Past Surgical History:   Procedure Laterality Date   APPENDECTOMY     CORONARY ARTERY BYPASS GRAFT N/A 05/01/2019   Procedure: CORONARY ARTERY BYPASS GRAFTING (CABG) TIMES FOUR USING LEFT AND RIGHT INTERNAL MAMMARY ARTERIES AND RIGHT RADIAL ARTERY WITH STERNAL PLATING;  Surgeon: German Bartlett PEDLAR, MD;  Location: [facility] OR;  Service: Open Heart Surgery;  Laterality: N/A;   EYE SURGERY Bilateral    catartact surgery   LEFT HEART CATH AND CORONARY ANGIOGRAPHY N/A 04/18/2019   Procedure: LEFT HEART CATH AND CORONARY ANGIOGRAPHY;  Surgeon: Claudene Victory ORN, MD;  Location: [facility] INVASIVE CV [facility];  Service: Cardiovascular;  Laterality: N/A;   NASAL SINUS SURGERY  03/28/2011   NASAL SINUS SURGERY  03/12/2023   RADIAL ARTERY HARVEST Right 05/01/2019   Procedure: Right Radial Artery Harvest;  Surgeon: German Bartlett PEDLAR, MD;  Location: [facility] OR;  Service: Open Heart Surgery;  Laterality: Right;   TEE WITHOUT CARDIOVERSION N/A 05/01/2019   Procedure: TRANSESOPHAGEAL ECHOCARDIOGRAM (TEE);  Surgeon: German Bartlett PEDLAR, MD;  Location: [facility] OR;  Service: Open Heart Surgery;  Laterality: N/A;   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 All other systems reviewed and are negative.   Labs/Other Tests and Data Reviewed:    Recent Labs: 12/13/2023: ALT 18; TSH 1.680 02/14/2024: Hemoglobin 14.2; Platelets 306 06/14/2024: BUN 20; Creatinine, Ser 1.15; Potassium 4.7; Sodium 142   Recent Lipid Panel Lab Results  Component Value Date/Time   CHOL 124 12/13/2023 10:34 AM   TRIG 90 12/13/2023 10:34 AM   HDL 46 12/13/2023 10:34 AM   CHOLHDL 2.7 12/13/2023 10:34 AM   CHOLHDL 4 12/30/2020 10:34 AM   LDLCALC 61 12/13/2023 10:34 AM   LDLDIRECT 122.0  08/20/2014 09:39 AM    Wt Readings from Last 3 Encounters:  06/14/24 171 lb (77.6 kg)  06/12/24 171 lb (77.6 kg)  03/20/24 171 lb 6.4 oz (77.7 kg)     Objective:    Vital Signs:  There were no vitals taken for this visit.   VITAL SIGNS:  reviewed GEN:  no acute distress EYES:  sclerae anicteric, EOMI - Extraocular Movements Intact, left upper eyelid hordeolum externum. RESPIRATORY:  normal respiratory effort, symmetric expansion NEURO:  alert and oriented x 3, no obvious focal deficit PSYCH:  normal affect  ASSESSMENT & PLAN:    Hordeolum externum, left upper eyelid Advised to perform warm compresses Erythromycin  ointment prescribed Contact if persistent or worsening symptoms   I discussed the assessment and treatment plan with the patient. The patient was provided an opportunity to ask questions, and all were answered. The patient agreed with the plan and demonstrated an understanding of the instructions.   The patient was advised to call back or seek an in-person evaluation if the symptoms worsen or if the condition fails to improve as anticipated.  The above assessment and management plan was discussed with the patient. The patient verbalized understanding of and has agreed to the management plan.   Medication Adjustments/Labs and Tests Ordered: Current medicines are reviewed at length with the patient today.  Concerns regarding medicines are outlined above.   Tests Ordered: No orders of the defined types were placed in this encounter.   Medication Changes: No orders of the defined types were placed in this encounter.    Note: This dictation was prepared with Dragon dictation along with smaller phrase technology. Similar sounding words can be transcribed inadequately or may not be corrected upon review. Any transcriptional errors that result from this process are unintentional.      Disposition:  Follow up  Signed, Suzzane MARLA Blanch, MD  08/03/2024 9:52 AM      [facility]    [1]  Current Meds  Medication Sig   acetaminophen  (TYLENOL ) 500 MG tablet Take 500-1,000 mg by mouth every 6 (six) hours as needed for moderate pain or headache.   albuterol  (PROVENTIL ) (2.5 MG/3ML) 0.083% nebulizer solution Take 3 mLs (2.5 mg total) by nebulization every 4 (four) hours as needed for wheezing or shortness of breath.   albuterol  (VENTOLIN  HFA) 108 (90 Base) MCG/ACT inhaler Inhale 2 puffs into the lungs every 6 (six) hours as needed for wheezing or shortness of breath.   alfuzosin (UROXATRAL) 10 MG 24 hr tablet Take 10 mg by mouth daily.   atorvastatin  (LIPITOR ) 80 MG tablet TAKE 1 TABLET BY MOUTH DAILY AT 6 PM.   azelastine  (ASTELIN ) 0.1 % nasal spray Place 2 sprays into both nostrils 2 (two) times daily as needed for rhinitis. Use in each nostril as directed   budesonide  (PULMICORT ) 0.5 MG/2ML nebulizer solution Take  2 mLs (0.5 mg total) by nebulization daily.   CALCIUM  PO Take 1,200 mg by mouth daily.   clopidogrel  (PLAVIX ) 75 MG tablet TAKE 1 TABLET BY MOUTH EVERY DAY   EPINEPHrine  (EPIPEN  2-PAK) 0.3 mg/0.3 mL IJ SOAJ injection Inject 0.3 mg into the muscle as needed for anaphylaxis.   ezetimibe  (ZETIA ) 10 MG tablet TAKE 1 TABLET BY MOUTH EVERY DAY   famotidine  (PEPCID ) 20 MG tablet ONE AFTER SUPPER   fluticasone  (FLONASE ) 50 MCG/ACT nasal spray Place 2 sprays into both nostrils daily.   formoterol  (PERFOROMIST ) 20 MCG/2ML nebulizer solution Take 2 mLs (20 mcg total) by nebulization 2 (two) times daily. Asthma J45.909   furosemide  (LASIX ) 20 MG tablet Take 1 tablet (20 mg total) by mouth daily as needed.   metoprolol  tartrate (LOPRESSOR ) 50 MG tablet Take 1 tablet (50 mg total) by mouth 2 (two) times daily.   montelukast  (SINGULAIR ) 10 MG tablet Take 1 tablet (10 mg total) by mouth at bedtime.   Multiple Vitamins-Minerals (ONE-A-DAY 50 PLUS PO) Take by mouth.   Multiple Vitamins-Minerals (PRESERVISION AREDS 2 PO)  Take by mouth.   nitroGLYCERIN  (NITROSTAT ) 0.4 MG SL tablet Place 1 tablet (0.4 mg total) under the tongue every 5 (five) minutes as needed for chest pain.   pantoprazole  (PROTONIX ) 40 MG tablet TAKE 1 TABLET BY MOUTH EVERY DAY   predniSONE  (DELTASONE ) 10 MG tablet 2 daily until breathing and nasal congestion better then 1 daily x 5 days and stop   telmisartan  (MICARDIS ) 80 MG tablet TAKE 1 TABLET BY MOUTH EVERY DAY   Current Facility-Administered Medications for the 08/03/24 encounter (Telemedicine) with Tobie Suzzane POUR, MD  Medication   omalizumab  (XOLAIR ) prefilled syringe 300 mg   "

## 2024-08-03 NOTE — Patient Instructions (Signed)
 Please apply erythromycin  ointment as prescribed.  Please perform warm compresses at least 4-5 times daily.

## 2024-08-04 ENCOUNTER — Telehealth: Payer: Self-pay

## 2024-08-04 NOTE — Telephone Encounter (Signed)
 Copied from CRM #[id number]. Topic: Clinical - Prescription Issue >> Jul 26, 2024 11:22 AM Isabell A wrote: Reason for CRM: Patient states he's been waiting for his inhaler since more then a week ago, would like to know where its coming from.   Callback number: [phone]    Pt asking for a different neb solu bc Performist is not carried at his pharmacy  Pls advise

## 2024-08-07 NOTE — Telephone Encounter (Signed)
 Called [facility] spoke with Dasia who states patient needs to contact them. They have attempted contact 12/30, 12/31 01/11 & 01/12. Called patient gave contact information and made aware meds would not be shipped until they speak with him. DX code J45.909 is on rx. NFN      Copied from CRM [id number]. Topic: Clinical - Prescription Issue >> Jul 26, 2024 11:22 AM Isabell A wrote: Reason for CRM: Patient states he's been waiting for his inhaler since more then a week ago, would like to know where its coming from.   Callback number: [phone] >> Aug 07, 2024 10:30 AM Leila BROCKS wrote: Patient [phone] or [phone] states is checking on formoterol  (PERFOROMIST ) 20 MCG/2ML nebulizer solution medication replacement status, that was suppose to be mail-in pharmacy, because [facility] no longer supply medication. Patient states has not heard from the office and has been out of medication two weeks. Patient states is not having any symptoms right now. Patient is worried, and concerned, and would like a call back please for consult.

## 2024-08-08 ENCOUNTER — Telehealth: Payer: Self-pay

## 2024-08-08 ENCOUNTER — Other Ambulatory Visit: Payer: Self-pay

## 2024-08-08 DIAGNOSIS — J339 Nasal polyp, unspecified: Secondary | ICD-10-CM

## 2024-08-08 MED ORDER — ALBUTEROL SULFATE (2.5 MG/3ML) 0.083% IN NEBU: 2.5000 mg | INHALATION_SOLUTION | Freq: Once | RESPIRATORY_TRACT | Status: AC

## 2024-08-08 NOTE — Telephone Encounter (Signed)
 Atc x1 to inform pt I sent in an alternative neb solutions but what I need to know is if there is an alternative pharmacy we can sent the performist (previous neb sol medication) to that would have this medication. Have also sent a PA for this   PA for Performist pls

## 2024-08-08 NOTE — Telephone Encounter (Signed)
 Copied from CRM #[id number]. Topic: Clinical - Prescription Issue >> Aug 04, 2024  4:38 PM Devaughn RAMAN wrote: Reason for CRM: Pt is calling in regards to albuterol  medication, pt would like an update about the medication. Pt stated he has been waiting for this medication for over 2 weeks, pt stated he would like a callback in regards to this.  See phone note

## 2024-08-09 NOTE — Telephone Encounter (Addendum)
 Atc x1 to inform pt I sent in an alternative neb solutions but what I need to know is if there is an alternative pharmacy we can sent the performist (previous neb sol medication) to that would have this medication. Have also sent a PA for this    PA for Performist pls     Phone note from 1/13    Atc x2 lmtcb 1/14

## 2024-08-10 NOTE — Telephone Encounter (Signed)
 Copied from CRM [id number]. Topic: General - Other >> Aug 09, 2024  3:56 PM Russell PARAS wrote: Reason for CRM:   Pt is returning missed call. Reviewed chart and notified pt it appeared Lucie was reaching out about medication(neb solutions). Reported she was trying to obtain an alternate pharmacy to send the Performist; however, he believed everything was worked out and he would receive the med through the mail in pharmacy.   Requested call back for clarification  CB#  [phone]  Called and got ahold of pt and confirmed that everything is straighten out

## 2024-08-11 ENCOUNTER — Other Ambulatory Visit (HOSPITAL_COMMUNITY): Payer: Self-pay

## 2024-08-14 ENCOUNTER — Ambulatory Visit

## 2024-08-14 DIAGNOSIS — J339 Nasal polyp, unspecified: Secondary | ICD-10-CM | POA: Diagnosis not present

## 2024-08-17 NOTE — Telephone Encounter (Signed)
 I called and spoke to pt. Pt states he already had his nebulizer handled but Dr Darlean had lowered his dosage for 1 time daily when it used to be to take it BID. Pt would like the rx to be and state take the nebulizer 1 time a day.   Pt states the prescription was just filled by Dr Darlean and does not need a rx any time soon. I informed pt to just take the rx as he has it and to let us  know a week before he runs out to call in a new RX for a 90 day supply and to only take 1 time a day. Pt verbalized understanding. NFN

## 2024-08-28 ENCOUNTER — Ambulatory Visit

## 2024-08-30 ENCOUNTER — Telehealth: Payer: Self-pay | Admitting: *Deleted

## 2024-08-30 NOTE — Telephone Encounter (Signed)
 Since we are no longer going to do buy and bill Xolair  in clinic wanted to know if possible can change over to [facility] for nasal polyps for patient. 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 Has followup appt with Arlean in March

## 2024-08-31 NOTE — Telephone Encounter (Signed)
 Do I need to move his appointment up to a sooner date to get him switched over?

## 2024-09-01 NOTE — Telephone Encounter (Signed)
 Thank you :)

## 2024-09-01 NOTE — Telephone Encounter (Signed)
 Ok. Thank you.

## 2024-09-01 NOTE — Telephone Encounter (Signed)
 I called and spoke with the patient and he has been scheduled to come see you this Monday 09/04/24 in [place].

## 2024-09-01 NOTE — Telephone Encounter (Signed)
 Can you please try to move this patient's follow up visit to a sooner date? Thank you

## 2024-09-04 ENCOUNTER — Ambulatory Visit: Admitting: Family Medicine

## 2024-09-25 ENCOUNTER — Ambulatory Visit: Admitting: Internal Medicine

## 2024-09-25 ENCOUNTER — Ambulatory Visit: Admitting: Family Medicine

## 2024-10-10 ENCOUNTER — Ambulatory Visit: Admitting: Podiatry

## 2024-12-12 ENCOUNTER — Ambulatory Visit: Payer: PRIVATE HEALTH INSURANCE | Admitting: Internal Medicine

## 2025-01-16 ENCOUNTER — Ambulatory Visit
